# Patient Record
Sex: Female | Born: 1960
Health system: Southern US, Community
[De-identification: ages and names within clinical notes are randomized; demographics above are authoritative.]

## PROBLEM LIST (undated history)

## (undated) DIAGNOSIS — L853 Xerosis cutis: Secondary | ICD-10-CM

## (undated) DIAGNOSIS — Z9889 Other specified postprocedural states: Secondary | ICD-10-CM

## (undated) DIAGNOSIS — M1990 Unspecified osteoarthritis, unspecified site: Secondary | ICD-10-CM

## (undated) DIAGNOSIS — F32A Depression, unspecified: Secondary | ICD-10-CM

## (undated) DIAGNOSIS — K219 Gastro-esophageal reflux disease without esophagitis: Secondary | ICD-10-CM

## (undated) DIAGNOSIS — D649 Anemia, unspecified: Secondary | ICD-10-CM

## (undated) DIAGNOSIS — Z872 Personal history of diseases of the skin and subcutaneous tissue: Secondary | ICD-10-CM

## (undated) DIAGNOSIS — L519 Erythema multiforme, unspecified: Secondary | ICD-10-CM

## (undated) DIAGNOSIS — M1610 Unilateral primary osteoarthritis, unspecified hip: Secondary | ICD-10-CM

## (undated) DIAGNOSIS — K5730 Diverticulosis of large intestine without perforation or abscess without bleeding: Secondary | ICD-10-CM

## (undated) DIAGNOSIS — M722 Plantar fascial fibromatosis: Secondary | ICD-10-CM

## (undated) DIAGNOSIS — N200 Calculus of kidney: Secondary | ICD-10-CM

## (undated) DIAGNOSIS — L409 Psoriasis, unspecified: Secondary | ICD-10-CM

## (undated) DIAGNOSIS — F329 Major depressive disorder, single episode, unspecified: Secondary | ICD-10-CM

## (undated) DIAGNOSIS — M2550 Pain in unspecified joint: Secondary | ICD-10-CM

## (undated) DIAGNOSIS — Z8719 Personal history of other diseases of the digestive system: Secondary | ICD-10-CM

## (undated) DIAGNOSIS — E785 Hyperlipidemia, unspecified: Secondary | ICD-10-CM

## (undated) DIAGNOSIS — R5383 Other fatigue: Secondary | ICD-10-CM

## (undated) DIAGNOSIS — Z86010 Personal history of colonic polyps: Secondary | ICD-10-CM

## (undated) DIAGNOSIS — Z780 Asymptomatic menopausal state: Secondary | ICD-10-CM

## (undated) DIAGNOSIS — R112 Nausea with vomiting, unspecified: Secondary | ICD-10-CM

## (undated) DIAGNOSIS — Z87442 Personal history of urinary calculi: Secondary | ICD-10-CM

## (undated) DIAGNOSIS — L509 Urticaria, unspecified: Secondary | ICD-10-CM

## (undated) DIAGNOSIS — E669 Obesity, unspecified: Secondary | ICD-10-CM

## (undated) DIAGNOSIS — E559 Vitamin D deficiency, unspecified: Secondary | ICD-10-CM

## (undated) HISTORY — PX: WISDOM TOOTH EXTRACTION: SHX21

## (undated) HISTORY — DX: Anemia, unspecified: D64.9

## (undated) HISTORY — DX: Personal history of urinary calculi: Z87.442

## (undated) HISTORY — DX: Pain in unspecified joint: M25.50

## (undated) HISTORY — PX: STRABISMUS SURGERY: SHX218

## (undated) HISTORY — DX: Xerosis cutis: L85.3

## (undated) HISTORY — DX: Vitamin D deficiency, unspecified: E55.9

## (undated) HISTORY — DX: Psoriasis, unspecified: L40.9

## (undated) HISTORY — DX: Personal history of diseases of the skin and subcutaneous tissue: Z87.2

## (undated) HISTORY — DX: Unilateral primary osteoarthritis, unspecified hip: M16.10

## (undated) HISTORY — DX: Gastro-esophageal reflux disease without esophagitis: K21.9

## (undated) HISTORY — PX: CLOSED MANIPULATION SHOULDER: SUR205

## (undated) HISTORY — DX: Erythema multiforme, unspecified: L51.9

## (undated) HISTORY — DX: Hyperlipidemia, unspecified: E78.5

## (undated) HISTORY — DX: Asymptomatic menopausal state: Z78.0

## (undated) HISTORY — DX: Other fatigue: R53.83

## (undated) HISTORY — DX: Urticaria, unspecified: L50.9

## (undated) HISTORY — DX: Calculus of kidney: N20.0

## (undated) HISTORY — PX: RHINOPLASTY: SUR1284

## (undated) HISTORY — DX: Obesity, unspecified: E66.9

## (undated) HISTORY — DX: Personal history of colonic polyps: Z86.010

---

## 1991-11-16 HISTORY — PX: HAND TENDON SURGERY: SHX663

## 1999-01-09 ENCOUNTER — Encounter: Admission: RE | Admit: 1999-01-09 | Discharge: 1999-04-09 | Payer: Self-pay | Admitting: Family Medicine

## 1999-05-27 ENCOUNTER — Ambulatory Visit (HOSPITAL_COMMUNITY): Admission: RE | Admit: 1999-05-27 | Discharge: 1999-05-27 | Payer: Self-pay | Admitting: Internal Medicine

## 1999-05-27 ENCOUNTER — Encounter: Payer: Self-pay | Admitting: Internal Medicine

## 1999-10-27 ENCOUNTER — Encounter: Admission: RE | Admit: 1999-10-27 | Discharge: 2000-01-25 | Payer: Self-pay | Admitting: Family Medicine

## 1999-12-01 ENCOUNTER — Other Ambulatory Visit: Admission: RE | Admit: 1999-12-01 | Discharge: 1999-12-01 | Payer: Self-pay | Admitting: Family Medicine

## 2001-05-10 ENCOUNTER — Other Ambulatory Visit: Admission: RE | Admit: 2001-05-10 | Discharge: 2001-05-10 | Payer: Self-pay | Admitting: Obstetrics and Gynecology

## 2002-01-31 ENCOUNTER — Encounter: Payer: Self-pay | Admitting: Surgery

## 2002-01-31 ENCOUNTER — Observation Stay (HOSPITAL_COMMUNITY): Admission: RE | Admit: 2002-01-31 | Discharge: 2002-02-01 | Payer: Self-pay | Admitting: Surgery

## 2002-01-31 ENCOUNTER — Encounter (INDEPENDENT_AMBULATORY_CARE_PROVIDER_SITE_OTHER): Payer: Self-pay | Admitting: Specialist

## 2002-01-31 HISTORY — PX: LAPAROSCOPIC CHOLECYSTECTOMY: SUR755

## 2002-06-05 ENCOUNTER — Other Ambulatory Visit: Admission: RE | Admit: 2002-06-05 | Discharge: 2002-06-05 | Payer: Self-pay | Admitting: Obstetrics and Gynecology

## 2003-05-23 ENCOUNTER — Encounter: Payer: Self-pay | Admitting: Rheumatology

## 2003-05-23 ENCOUNTER — Ambulatory Visit (HOSPITAL_COMMUNITY): Admission: RE | Admit: 2003-05-23 | Discharge: 2003-05-23 | Payer: Self-pay | Admitting: Rheumatology

## 2003-07-30 ENCOUNTER — Other Ambulatory Visit: Admission: RE | Admit: 2003-07-30 | Discharge: 2003-07-30 | Payer: Self-pay | Admitting: Obstetrics and Gynecology

## 2004-07-31 ENCOUNTER — Other Ambulatory Visit: Admission: RE | Admit: 2004-07-31 | Discharge: 2004-07-31 | Payer: Self-pay | Admitting: Obstetrics and Gynecology

## 2004-11-20 ENCOUNTER — Encounter: Admission: RE | Admit: 2004-11-20 | Discharge: 2004-11-20 | Payer: Self-pay | Admitting: Obstetrics and Gynecology

## 2005-10-06 ENCOUNTER — Ambulatory Visit (HOSPITAL_COMMUNITY): Admission: RE | Admit: 2005-10-06 | Discharge: 2005-10-06 | Payer: Self-pay | Admitting: Urology

## 2005-10-06 ENCOUNTER — Ambulatory Visit (HOSPITAL_BASED_OUTPATIENT_CLINIC_OR_DEPARTMENT_OTHER): Admission: RE | Admit: 2005-10-06 | Discharge: 2005-10-06 | Payer: Self-pay | Admitting: Urology

## 2005-10-06 HISTORY — PX: OTHER SURGICAL HISTORY: SHX169

## 2005-11-03 ENCOUNTER — Other Ambulatory Visit: Admission: RE | Admit: 2005-11-03 | Discharge: 2005-11-03 | Payer: Self-pay | Admitting: Obstetrics and Gynecology

## 2005-11-15 HISTORY — PX: RHINOPLASTY: SUR1284

## 2006-11-17 ENCOUNTER — Ambulatory Visit (HOSPITAL_COMMUNITY): Admission: RE | Admit: 2006-11-17 | Discharge: 2006-11-17 | Payer: Self-pay | Admitting: Obstetrics and Gynecology

## 2006-11-25 ENCOUNTER — Encounter: Admission: RE | Admit: 2006-11-25 | Discharge: 2006-11-25 | Payer: Self-pay | Admitting: Obstetrics and Gynecology

## 2008-05-21 ENCOUNTER — Ambulatory Visit (HOSPITAL_COMMUNITY): Admission: RE | Admit: 2008-05-21 | Discharge: 2008-05-21 | Payer: Self-pay | Admitting: Urology

## 2008-05-21 ENCOUNTER — Ambulatory Visit (HOSPITAL_COMMUNITY): Admission: RE | Admit: 2008-05-21 | Discharge: 2008-05-21 | Payer: Self-pay | Admitting: Obstetrics and Gynecology

## 2008-05-24 ENCOUNTER — Ambulatory Visit (HOSPITAL_BASED_OUTPATIENT_CLINIC_OR_DEPARTMENT_OTHER): Admission: RE | Admit: 2008-05-24 | Discharge: 2008-05-24 | Payer: Self-pay | Admitting: Urology

## 2008-05-24 HISTORY — PX: OTHER SURGICAL HISTORY: SHX169

## 2009-04-11 ENCOUNTER — Other Ambulatory Visit: Admission: RE | Admit: 2009-04-11 | Discharge: 2009-04-11 | Payer: Self-pay | Admitting: Family Medicine

## 2010-01-26 ENCOUNTER — Ambulatory Visit (HOSPITAL_COMMUNITY): Admission: RE | Admit: 2010-01-26 | Discharge: 2010-01-26 | Payer: Self-pay | Admitting: Obstetrics and Gynecology

## 2010-02-11 ENCOUNTER — Ambulatory Visit (HOSPITAL_COMMUNITY): Admission: RE | Admit: 2010-02-11 | Discharge: 2010-02-11 | Payer: Self-pay | Admitting: Internal Medicine

## 2010-02-13 ENCOUNTER — Ambulatory Visit (HOSPITAL_COMMUNITY): Admission: RE | Admit: 2010-02-13 | Discharge: 2010-02-13 | Payer: Self-pay | Admitting: Urology

## 2010-02-13 HISTORY — PX: OTHER SURGICAL HISTORY: SHX169

## 2011-02-03 LAB — COMPREHENSIVE METABOLIC PANEL
ALT: 16 U/L (ref 0–35)
AST: 16 U/L (ref 0–37)
Albumin: 3.2 g/dL — ABNORMAL LOW (ref 3.5–5.2)
Alkaline Phosphatase: 123 U/L — ABNORMAL HIGH (ref 39–117)
BUN: 8 mg/dL (ref 6–23)
CO2: 28 mEq/L (ref 19–32)
Calcium: 8.7 mg/dL (ref 8.4–10.5)
Chloride: 109 mEq/L (ref 96–112)
Creatinine, Ser: 0.77 mg/dL (ref 0.4–1.2)
GFR calc Af Amer: >60 mL/min (ref >60)
GFR calc non Af Amer: >60 mL/min (ref >60)
Glucose, Bld: 91 mg/dL (ref 70–99)
Potassium: 3.8 mEq/L (ref 3.5–5.1)
Sodium: 141 mEq/L (ref 135–145)
Total Bilirubin: 0.6 mg/dL (ref 0.3–1.2)
Total Protein: 6.5 g/dL (ref 6.0–8.3)

## 2011-02-03 LAB — URINALYSIS, ROUTINE W REFLEX MICROSCOPIC
Bilirubin Urine: NEGATIVE
Glucose, UA: NEGATIVE mg/dL
Ketones, ur: NEGATIVE mg/dL
Nitrite: NEGATIVE
Protein, ur: NEGATIVE mg/dL
Specific Gravity, Urine: 1.012 (ref 1.005–1.030)
Urobilinogen, UA: 0.2 mg/dL (ref 0.0–1.0)
pH: 5.5 (ref 5.0–8.0)

## 2011-02-03 LAB — APTT: aPTT: 27 seconds (ref 24–37)

## 2011-02-03 LAB — PROTIME-INR
INR: 0.95 (ref 0.00–1.49)
Prothrombin Time: 12.6 seconds (ref 11.6–15.2)

## 2011-02-03 LAB — URINE MICROSCOPIC-ADD ON

## 2011-02-03 LAB — PREGNANCY, URINE: Preg Test, Ur: NEGATIVE

## 2011-02-08 ENCOUNTER — Other Ambulatory Visit (HOSPITAL_COMMUNITY): Payer: Self-pay | Admitting: Obstetrics and Gynecology

## 2011-02-08 DIAGNOSIS — Z1231 Encounter for screening mammogram for malignant neoplasm of breast: Secondary | ICD-10-CM

## 2011-02-16 ENCOUNTER — Ambulatory Visit (HOSPITAL_COMMUNITY): Admission: RE | Admit: 2011-02-16 | Payer: Commercial Managed Care - PPO | Source: Ambulatory Visit

## 2011-03-12 ENCOUNTER — Ambulatory Visit (HOSPITAL_COMMUNITY)
Admission: RE | Admit: 2011-03-12 | Discharge: 2011-03-12 | Disposition: A | Discharge status: Home / Self Care | Payer: Commercial Managed Care - PPO | Source: Ambulatory Visit | Attending: Obstetrics and Gynecology | Admitting: Obstetrics and Gynecology

## 2011-03-12 DIAGNOSIS — Z1231 Encounter for screening mammogram for malignant neoplasm of breast: Secondary | ICD-10-CM | POA: Insufficient documentation

## 2011-03-30 NOTE — Op Note (Signed)
[name], SCHEMM NO.:  [id number]   MEDICAL RECORD NO.:  [id number]          PATIENT TYPE:  OUT   LOCATION:  XRAY                          FACILITY:  [facility]   PHYSICIAN:  Ronald L. Earlene Plater, M.D.  DATE OF BIRTH:  [birth year]   DATE OF PROCEDURE:  05/24/2008  DATE OF DISCHARGE:  05/21/2008                               OPERATIVE REPORT   DIAGNOSIS:  Type 3 stress urinary incontinence.   OPERATIVE PROCEDURE:  Cystourethroscopy and placement of Lynx suprapubic  sling.   SURGEON:  Gaynelle Arabian, M.D.   ANESTHESIA:  General mask.   ESTIMATED BLOOD LOSS:  25 mL.   TUBES:  None.   COMPLICATIONS:  None.   INDICATIONS FOR PROCEDURE:  Dana Williams is a lovely 50 year old white female  who presented with persistent stress urinary incontinence.  She  underwent urodynamics which revealed a stable bladder.  However, she was  found have a leak point pressure 97 cm of water at 100 mL volume.  At  higher  volumes it leaked more, so we felt that it was clinically type 3  stress urinary incontinence, although it was a borderline leak point  pressure.  After understanding risks, benefits and alternatives, she  elected to proceed with cystourethroscopy and placement of Lynx  suprapubic sling.   PROCEDURE IN DETAIL:  The patient was placed in supine position and  after proper general mask anesthesia, was placed in the dorsal lithotomy  position and prepped and draped with Betadine in sterile fashion.  A 16-  French Foley catheter was inserted.  The bladder was drained.  An  approximately 1 cm incision was made at the mid urethral area over  vertically through the mucosa after approximately 4 mL of 1% Xylocaine  with epinephrine had been injected submucosally.  Flaps were created  right and left side and the endopelvic fascia could be palpated under  the pubis.  Punch holes were then performed in the suprapubic region one  fingerbreadth superior and two fingerbreadths lateral to the  midline  pubis and utilizing the needles, each was inserted in the proper manner  and delivered through both the right and left sides through the  endopelvic fascia into the vaginal wound.  A cystourethroscopy was then  performed.  The bladder was distended and with the 12 and 70 degrees  lenses there was no perforation noted.  Efflux of clear urine was noted  from normally placed ureteral orifices bilaterally.  The bladder was  smooth-walled.  The bladder was then drained and the [place] sling was  placed and felt to be in good position.  No undue tension was noted and  it completely deployed.  A scissors could be placed behind it easily so  it did not distort the urethra.  Reinspection of the bladder again with  12 and 70 degree lenses fully distended revealed there were no  perforations.  The bladder was again drained.  The mucosa was closed  with a running 3-0 Vicryl suture and  the tapes were cut at the skin level and the skin punch holes were  closed  with Dermabond.  It was felt that no packing was necessary and  the patient tolerated procedure well with no complications and she was  taken to the recovery room stable.      Ronald L. Earlene Plater, M.D.  Electronically Signed     RLD/MEDQ  D:  05/24/2008  T:  05/24/2008  Job:  [id number]

## 2011-04-02 NOTE — Op Note (Signed)
NAMECODIE, HAINER                ACCOUNT NO.:  [id number]   MEDICAL RECORD NO.:  [id number]          PATIENT TYPE:  AMB   LOCATION:  [facility]                         FACILITY:  [facility]   PHYSICIAN:  Ronald L. Earlene Plater, M.D.  DATE OF BIRTH:  [birth year]   DATE OF PROCEDURE:  10/06/2005  DATE OF DISCHARGE:                                 OPERATIVE REPORT   PREOPERATIVE DIAGNOSES:  Left ureterolithiasis with impending sepsis.   POSTOPERATIVE DIAGNOSES:  Left ureterolithiasis with impending sepsis.   PROCEDURE:  Cystourethroscopy, left retrograde ureteropyelogram, left  ureteroscopy with holmium laser lithotripsy and stone basketing, fragment  extraction and placement of left double-J stent.   SURGEON:  Lucrezia Starch. Earlene Plater, M.D.   ANESTHESIA:  LMA.   ESTIMATED BLOOD LOSS:  Negligible.   TUBES:  26 cm 6 [place] Cook double pigtail stent.   COMPLICATIONS:  None.   INDICATIONS FOR PROCEDURE:  Ms. Herbison is a lovely 50 year old white female  who presented with a several day history of left flank pain, nausea and  vomiting. She had had fever up to 101 and had begun Cipro over the weekend.  She was seen yesterday and did not appear to be septic; however, she had not  passed a stone fragment and although the pain was much less it was still  present and it was felt that intervention was indicated. A CT scan revealed  an approximately 3 mm left lower ureteral calculus with some  hydroureteronephrosis. After understanding the risks, benefits, and  alternatives, she has elected to proceed. Of note, she also had some stones  in the right kidney and a 4 mm lower pole stone in the left kidney.   DESCRIPTION OF PROCEDURE:  The patient was placed in the supine position,  proper LMA anesthesia, was placed in the dorsal lithotomy position then  prepped and draped with Betadine in a sterile fashion. Cystourethroscopy was  performed with a 22.5 French Olympus panendoscope utilizing the 12 and 70  degree  lenses. The bladder was carefully inspected and noted to be without  lesions. Both ureteral orifices were in the normal location. Under  fluoroscopic guidance, a slippery tipped guidewire was placed, 0.38 [place],  was placed into the left renal pelvis. After a retrograde ureteropyelogram  was performed with a 6 [place] open ended catheter, the filling defect was  noted just below the vessels on the left side in the junction between the  mid third and lower third ureter and with some hydronephrosis above it. The  wire was passed past it and utilizing the short thin semiflexible  ureteroscope, the area was visualized and there was noted to be a highly  impacted yellowish stone present which was approximately 3 mm in diameter.  Utilizing the 365 holmium laser fiber on a setting of 0.5 and a repetition  rate of 6, the stone was serially fragmented and larger fragments were  removed with a nitinol basket and will be submitted for stone analysis.  Reinspection of the lower two-thirds ureter revealed no significant  fragments, no perforations were present and no other lesions were noted to  be present. The ureteroscope was visually removed  under fluoroscopic guidance. A 26 cm 6 [place] Cook double pigtail stent with  a pullout string was placed and noted to be in good position within the left  renal pelvis within the bladder. The bladder was drained, panendoscope was  removed. The string was taped to the perineum and the patient was taken to  the recovery room stable.      Ronald L. Earlene Plater, M.D.  Electronically Signed     RLD/MEDQ  D:  10/06/2005  T:  10/06/2005  Job:  [id number]

## 2011-04-02 NOTE — Op Note (Signed)
[facility]  Patient:    Dana Williams, Dana Williams Visit Number: [id number] MRN: [id number]          Service Type: SUR Location: 3W 0351 01 Attending Physician:  Dana Williams Dictated by:   Dana Williams, M.D. Proc. Date: 01/31/02 Admit Date:  01/31/2002   CC:         Dana Williams, M.D.  Dana Williams, M.D.  Dana Williams, M.D.   Operative Report  DATE OF BIRTH:  [birth year]  PREOPERATIVE DIAGNOSIS:  Chronic cholecystitis and cholelithiasis.  POSTOPERATIVE DIAGNOSIS:  Chronic cholecystitis and cholelithiasis.  PROCEDURE:  Laparoscopic cholecystectomy with intraoperative cholangiogram.  SURGEON:  Dana Williams, M.D.  FIRST ASSISTANT:  Dana Williams, M.D.  ANESTHESIA:  General endotracheal.  ESTIMATED BLOOD LOSS:  Minimal.  INDICATION FOR PROCEDURE:  Dana Williams is a 50 year old white female, who has had gallstones with a couple of attacks.  By ultrasound, her gallstone measures approximately 1.7 cm.  She now comes for attempted laparoscopic cholecystectomy.  I have discussed with her both the indications and potential complications of the procedure.  OPERATIVE NOTE:  The patient was placed in a supine position, given a general endotracheal anesthetic.  She was given 1 g of Ancef at the initiation of the procedure.  She had PAS stockings on her legs.  Her abdomen was prepped with Betadine solution and sterilely draped.  An infraumbilical incision was made with sharp dissection carried down to the abdominal cavity.  A 10 mm laparoscope was inserted through a 12 mm Hasson trocar.  The Hasson trocar was secured with a 0 Vicryl suture.  A laparoscopic exploration revealed the right and left lobes of the liver unremarkable.  Anterior wall of the stomach was unremarkable.  I could see the right colon and pelvis, saw no mass, nodule, or other suspicious abnormality.  I then place three trocars, a 10 mm Ethicon trocar in  the subxiphoid location, a 5 mm Ethicon trocar in the right mid subcostal location, and a 5 mm Ethicon trocar in the right lateral subcostal location.  The gallbladder was thickened, consistent with a probable chronic cholecystitis.  The gallbladder was grasped, rotated cephalad.  Dissection was then carried out at the gallbladder cystic duct junction.  I identified the triangle of Calot, isolated the cystic duct, and shot intraoperative cholangiogram.  Intraoperative cholangiogram was shot using a cut off taut catheter, inserted through a 14 gauge Jelco in the right subcostal location.  This taut catheter was then placed in the side of a cut cystic duct and secured with an Endo Clip.  Using fluoroscopy, I injected between 6-8 cc of half-strength Hypaque solution and showed free flow of contrast down the cystic duct into the common bile duct into the duodenum.  The contrast refluxed up into the hepatic radicles. There was no filling defect, no mass, no obstruction.  This was felt to be a normal intraoperative cholangiogram.  The taut catheter was then removed from the cystic duct.  The cystic duct was triply Endo Clipped and then divided.  The cystic artery had an anterior and posterior branch.  Both branches were identified separately, doubly Endo Clipped, and divided.  The gallbladder was then sharply and bluntly dissected from the gallbladder bed using primarily hook Bovie coagulation.  Prior to division of the gallbladder from the gallbladder bed, I revisualized the triangle of Calot and the gallbladder bed, saw no bleeding or bile leak.  The gallbladder was then divided  and delivered through the umbilicus intact and sent to pathology.  I irrigated out the abdomen with approximately 500 cc of saline.  I then removed the trocar under direct visualization.  There was no bleeding in the trocar site.  The umbilical trocar was closed with the pursestring 0 Vicryl which was already  there.  And, again, each trocar site had been infiltrated with 0.25% Marcaine, approximately 15 cc used for all four trocar sites.  The skin incision was then closed with a 5-0 Vicryl, was painted with tincture of Benzoin, Steri-Strips, and sterilely dressed.  The patient tolerated the procedure well, was transferred to recovery room in good condition.  Sponge and needle counts were correct. Dictated by:   Dana Williams, M.D. Attending Physician:  Dana Williams DD:  01/31/02 TD:  01/31/02 Job: [id number]

## 2011-08-12 LAB — POCT HEMOGLOBIN-HEMACUE
Hemoglobin: 12.8
Operator id: 133231

## 2012-01-14 ENCOUNTER — Other Ambulatory Visit: Payer: Self-pay

## 2012-04-04 ENCOUNTER — Other Ambulatory Visit (HOSPITAL_COMMUNITY): Payer: Self-pay | Admitting: Obstetrics and Gynecology

## 2012-04-04 DIAGNOSIS — Z0000 Encounter for general adult medical examination without abnormal findings: Secondary | ICD-10-CM

## 2012-04-04 DIAGNOSIS — Z1231 Encounter for screening mammogram for malignant neoplasm of breast: Secondary | ICD-10-CM

## 2012-04-18 ENCOUNTER — Encounter: Payer: Self-pay | Admitting: Family Medicine

## 2012-04-18 ENCOUNTER — Ambulatory Visit (INDEPENDENT_AMBULATORY_CARE_PROVIDER_SITE_OTHER): Payer: 59 | Admitting: Family Medicine

## 2012-04-18 VITALS — Ht 66.0 in | Wt 211.1 lb

## 2012-04-18 DIAGNOSIS — E785 Hyperlipidemia, unspecified: Secondary | ICD-10-CM

## 2012-04-18 NOTE — Patient Instructions (Addendum)
-   Google ROBERT LUSTIG and SUGAR.  - Write a summary of what you learn from this.   - Remember:  We make time for what we value.    - More home food prep requires more organization than time.   - Goals:  1. Walk outside at least 20 min 4 X wk.     - Go after work, before you go home.    2. Include non-starchy vegetables at least 2 X day.    3. Try one veg per week that is not squash, green beans, or zucchini.     - Salads, tomatoes, cucumber, carrots, mixed veg's, snow peas, broccoli, cauliflower, sauteed leafy greens.    - Try roasting any veg's; check out veg recipes online.   - AT FOLLOW-UP, WE'LL TALK ABOUT EMOTIONAL EATING AND APPETITE CONTROL.

## 2012-04-18 NOTE — Progress Notes (Signed)
Medical Nutrition Therapy:  Appt start time: 1600 end time:  1700.  Assessment:  Primary concerns today: Weight management and hyperlipidemia.  PCP is Sigmund Hazel, MD w/ [facility].  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  Starting in Aug, the work schedule will change week-to-week in terms of which days she works.   Usual eating pattern includes 3 meals and 1-2 snacks per day.  Everyday foods include whole-wheat waffles w/ fake butter & peanut butter, 96 oz Crystal Light, diet Mtn Dew.  Avoided foods include leafy greens, Brussels sprouts.   Usual physical activity includes none.    Annaleia is a single parent of a 17-YO X 15 yrs.  She now realizes her world has revolved about around her daughter, so she now often feels isolated.  She attributes much of her weight gain to her becoming more isolated.  She is alone a lot, and often eats foods that are more like snacks in place of meals rather than cook for herself.  She and her daughter have both just started counseling.  She hopes to start feeling motivated to exercise once her anti-depressant kicks in, but finances are tight, so she doesn't foresee joining a gym any time soon.    24-hr recall: B (6 AM)-   1 c coffee w/ 2 tbsp sugar-free [place] Vanilla Creamer, 2 Equal pkts, 2 whole-wheat waffles w/ fake butter & peanut butter L(11 PM)-   3 oz grilled chx, 1/2 corn, 1/2 c apple sauce, Crystal Light Snk-   none D ( PM)-  4 oz grilled chx breast w/ BBQ sauce, 1/2 grilled squash, diet Mtn Dew Snk ( PM)-  2 fun-size [place] Bars  Progress Towards Goal(s):  In progress.   Nutritional Diagnosis:  NB-2.1 Physical inactivity As related to poor motivation.  As evidenced by no regular activity. NI-1.5 Excessive energy intake As related to expenditure.  As evidenced by BMI of 34.1.    Intervention:  Nutrition education.  Monitoring/Evaluation:  Dietary intake, exercise, and body weight in 2 weeks.

## 2012-05-01 ENCOUNTER — Ambulatory Visit: Payer: 59 | Admitting: Family Medicine

## 2012-05-02 ENCOUNTER — Inpatient Hospital Stay (HOSPITAL_COMMUNITY): Admission: RE | Admit: 2012-05-02 | Payer: Commercial Managed Care - PPO | Source: Ambulatory Visit

## 2012-05-08 ENCOUNTER — Ambulatory Visit: Payer: 59 | Admitting: Family Medicine

## 2012-06-07 ENCOUNTER — Other Ambulatory Visit: Payer: Self-pay

## 2012-07-20 ENCOUNTER — Emergency Department (HOSPITAL_COMMUNITY): Admission: EM | Admit: 2012-07-20 | Discharge: 2012-07-20 | Payer: Self-pay

## 2013-07-02 ENCOUNTER — Other Ambulatory Visit (HOSPITAL_COMMUNITY): Payer: Self-pay | Admitting: *Deleted

## 2013-07-02 DIAGNOSIS — N200 Calculus of kidney: Secondary | ICD-10-CM

## 2013-07-03 ENCOUNTER — Ambulatory Visit (HOSPITAL_COMMUNITY)
Admission: RE | Admit: 2013-07-03 | Discharge: 2013-07-03 | Disposition: A | Discharge status: Home / Self Care | Payer: 59 | Source: Ambulatory Visit | Attending: Urology | Admitting: Urology

## 2013-07-03 DIAGNOSIS — N200 Calculus of kidney: Secondary | ICD-10-CM | POA: Insufficient documentation

## 2013-07-03 DIAGNOSIS — Z87442 Personal history of urinary calculi: Secondary | ICD-10-CM | POA: Insufficient documentation

## 2013-09-27 ENCOUNTER — Other Ambulatory Visit: Payer: Self-pay | Admitting: Dermatology

## 2013-11-20 ENCOUNTER — Other Ambulatory Visit (HOSPITAL_COMMUNITY): Payer: Self-pay | Admitting: Physician Assistant

## 2013-11-20 DIAGNOSIS — Z1231 Encounter for screening mammogram for malignant neoplasm of breast: Secondary | ICD-10-CM

## 2013-12-28 ENCOUNTER — Ambulatory Visit (AMBULATORY_SURGERY_CENTER): Payer: Self-pay | Admitting: *Deleted

## 2013-12-28 VITALS — Ht 66.0 in | Wt 215.0 lb

## 2013-12-28 DIAGNOSIS — Z1211 Encounter for screening for malignant neoplasm of colon: Secondary | ICD-10-CM

## 2013-12-28 MED ORDER — NA SULFATE-K SULFATE-MG SULF 17.5-3.13-1.6 GM/177ML PO SOLN: 1.0000 | Freq: Once | ORAL | Status: DC

## 2013-12-28 NOTE — Progress Notes (Signed)
Denies allergies to eggs or soy products. Denies complications with sedation or anesthesia. 

## 2014-01-01 ENCOUNTER — Other Ambulatory Visit (HOSPITAL_COMMUNITY): Payer: Self-pay | Admitting: Physician Assistant

## 2014-01-01 ENCOUNTER — Ambulatory Visit (HOSPITAL_COMMUNITY)
Admission: RE | Admit: 2014-01-01 | Discharge: 2014-01-01 | Disposition: A | Discharge status: Home / Self Care | Payer: 59 | Source: Ambulatory Visit | Attending: Physician Assistant | Admitting: Physician Assistant

## 2014-01-01 DIAGNOSIS — Z1231 Encounter for screening mammogram for malignant neoplasm of breast: Secondary | ICD-10-CM

## 2014-01-02 ENCOUNTER — Ambulatory Visit (HOSPITAL_COMMUNITY): Payer: 59

## 2014-01-03 ENCOUNTER — Other Ambulatory Visit: Payer: Self-pay | Admitting: Obstetrics and Gynecology

## 2014-01-03 DIAGNOSIS — R928 Other abnormal and inconclusive findings on diagnostic imaging of breast: Secondary | ICD-10-CM

## 2014-01-07 ENCOUNTER — Other Ambulatory Visit (HOSPITAL_COMMUNITY): Payer: Self-pay | Admitting: Obstetrics and Gynecology

## 2014-01-07 DIAGNOSIS — Z1231 Encounter for screening mammogram for malignant neoplasm of breast: Secondary | ICD-10-CM

## 2014-01-16 ENCOUNTER — Ambulatory Visit
Admission: RE | Admit: 2014-01-16 | Discharge: 2014-01-16 | Disposition: A | Discharge status: Home / Self Care | Payer: 59 | Source: Ambulatory Visit | Attending: Obstetrics and Gynecology | Admitting: Obstetrics and Gynecology

## 2014-01-16 DIAGNOSIS — R928 Other abnormal and inconclusive findings on diagnostic imaging of breast: Secondary | ICD-10-CM

## 2014-01-25 ENCOUNTER — Ambulatory Visit (AMBULATORY_SURGERY_CENTER): Payer: 59 | Admitting: Internal Medicine

## 2014-01-25 ENCOUNTER — Encounter: Payer: Self-pay | Admitting: Internal Medicine

## 2014-01-25 VITALS — BP 135/75 | HR 83 | Temp 98.2°F | Resp 15 | Ht 66.0 in | Wt 215.0 lb

## 2014-01-25 DIAGNOSIS — K5730 Diverticulosis of large intestine without perforation or abscess without bleeding: Secondary | ICD-10-CM

## 2014-01-25 DIAGNOSIS — D126 Benign neoplasm of colon, unspecified: Secondary | ICD-10-CM

## 2014-01-25 DIAGNOSIS — Z1211 Encounter for screening for malignant neoplasm of colon: Secondary | ICD-10-CM

## 2014-01-25 HISTORY — PX: COLONOSCOPY WITH PROPOFOL: SHX5780

## 2014-01-25 MED ORDER — SODIUM CHLORIDE 0.9 % IV SOLN: 500.0000 mL | INTRAVENOUS | Status: DC

## 2014-01-25 NOTE — Patient Instructions (Addendum)
I found and removed one tint (85mm) polyp. You have mild-moderate diverticulosis. Everything else normal and a good prep.  I will let you know pathology results and when to have another routine colonoscopy by mail.  I appreciate the opportunity to care for you. Gatha Mayer, MD, FACG  YOU HAD AN ENDOSCOPIC PROCEDURE TODAY AT [facility]: Refer to the procedure report that was given to you for any specific questions about what was found during the examination.  If the procedure report does not answer your questions, please call your gastroenterologist to clarify.  If you requested that your care partner not be given the details of your procedure findings, then the procedure report has been included in a sealed envelope for you to review at your convenience later.  YOU SHOULD EXPECT: Some feelings of bloating in the abdomen. Passage of more gas than usual.  Walking can help get rid of the air that was put into your GI tract during the procedure and reduce the bloating. If you had a lower endoscopy (such as a colonoscopy or flexible sigmoidoscopy) you may notice spotting of blood in your stool or on the toilet paper. If you underwent a bowel prep for your procedure, then you may not have a normal bowel movement for a few days.  DIET: Your first meal following the procedure should be a light meal and then it is ok to progress to your normal diet.  A half-sandwich or bowl of soup is an example of a good first meal.  Heavy or fried foods are harder to digest and may make you feel nauseous or bloated.  Likewise meals heavy in dairy and vegetables can cause extra gas to form and this can also increase the bloating.  Drink plenty of fluids but you should avoid alcoholic beverages for 24 hours.  ACTIVITY: Your care partner should take you home directly after the procedure.  You should plan to take it easy, moving slowly for the rest of the day.  You can resume normal activity the day after  the procedure however you should NOT DRIVE or use heavy machinery for 24 hours (because of the sedation medicines used during the test).    SYMPTOMS TO REPORT IMMEDIATELY: A gastroenterologist can be reached at any hour.  During normal business hours, 8:30 AM to 5:00 PM Monday through Friday, call [phone].  After hours and on weekends, please call the GI answering service at [phone] who will take a message and have the physician on call contact you.   Following lower endoscopy (colonoscopy or flexible sigmoidoscopy):  Excessive amounts of blood in the stool  Significant tenderness or worsening of abdominal pains  Swelling of the abdomen that is new, acute  Fever of 100F or higher  FOLLOW UP: If any biopsies were taken you will be contacted by phone or by letter within the next 1-3 weeks.  Call your gastroenterologist if you have not heard about the biopsies in 3 weeks.  Our staff will call the home number listed on your records the next business day following your procedure to check on you and address any questions or concerns that you may have at that time regarding the information given to you following your procedure. This is a courtesy call and so if there is no answer at the home number and we have not heard from you through the emergency physician on call, we will assume that you have returned to your regular daily activities without  incident.  SIGNATURES/CONFIDENTIALITY: You and/or your care partner have signed paperwork which will be entered into your electronic medical record.  These signatures attest to the fact that that the information above on your After Visit Summary has been reviewed and is understood.  Full responsibility of the confidentiality of this discharge information lies with you and/or your care-partner.

## 2014-01-25 NOTE — Op Note (Signed)
[place]  [address] [place], [phone]   COLONOSCOPY PROCEDURE REPORT  PATIENT: Atiya, Dana Williams  MR#: [id number] BIRTHDATE: October 24, 1961 , 74  yrs. old GENDER: Female ENDOSCOPIST: Gatha Mayer, MD, [facility] REFERRED YW:VPXT Sabra Heck, M.D. PROCEDURE DATE:  01/25/2014 PROCEDURE:   Colonoscopy with biopsy First Screening Colonoscopy - Avg.  risk and is 50 yrs.  old or older Yes.  Prior Negative Screening - Now for repeat screening. N/A  History of Adenoma - Now for follow-up colonoscopy & has been > or = to 3 yrs.  N/A  Polyps Removed Today? Yes. ASA CLASS:   Class II INDICATIONS:average risk screening and first colonoscopy. MEDICATIONS: propofol (Diprivan) 400mg  IV, MAC sedation, administered by CRNA, and These medications were titrated to patient response per physician's verbal order  DESCRIPTION OF PROCEDURE:   After the risks benefits and alternatives of the procedure were thoroughly explained, informed consent was obtained.  A digital rectal exam revealed no abnormalities of the rectum.   The LB [id number]  endoscope was introduced through the anus and advanced to the cecum, which was identified by both the appendix and ileocecal valve. No adverse events experienced.   The quality of the prep was Suprep good  The instrument was then slowly withdrawn as the colon was fully examined.   COLON FINDINGS: A sessile polyp measuring 2 mm in size was found in the ascending colon.  A polypectomy was performed with cold forceps.  The resection was complete and the polyp tissue was completely retrieved.   Moderate diverticulosis was noted in the sigmoid colon.   The colon mucosa was otherwise normal. Retroflexed views revealed no abnormalities. The time to cecum=4 minutes 33 seconds.  Withdrawal time=11 minutes 55 seconds.  The scope was withdrawn and the procedure completed. COMPLICATIONS: There were no complications.  ENDOSCOPIC IMPRESSION: 1.   Sessile  polyp measuring 2 mm in size was found in the ascending colon; polypectomy was performed with cold forceps 2.   Moderate diverticulosis was noted in the sigmoid colon 3.   The colon mucosa was otherwise normal - excellent prep - first screening exam  RECOMMENDATIONS: Timing of repeat colonoscopy will be determined by pathology findings.   eSigned:  Gatha Mayer, MD, [facility] 01/25/2014 11:16 AM   cc: Kathyrn Lass, MD and The Patient   PATIENT NAME:  Dana Williams, Dana Williams MR#: [id number]

## 2014-01-25 NOTE — Progress Notes (Signed)
Called to room to assist during endoscopic procedure.  Patient ID and intended procedure confirmed with present staff. Received instructions for my participation in the procedure from the performing physician.  

## 2014-01-25 NOTE — Progress Notes (Signed)
  [facility] Post-op Note  Patient: Dana Williams  Procedure(s) Performed: colonoscopy  Patient Location: [facility] - Recovery Area  Anesthesia Type: Deep Sedation/Propofol  Level of Consciousness: awake, oriented and patient cooperative  Airway and Oxygen Therapy: Patient Spontanous Breathing  Post-op Pain: none  Post-op Assessment:  Post-op Vital signs reviewed, Patient's Cardiovascular Status Stable, Respiratory Function Stable, Patent Airway, No signs of Nausea or vomiting and Pain level controlled  Post-op Vital Signs: Reviewed and stable  Complications: No apparent anesthesia complications  EARGLE, BETH E 11:13 AM

## 2014-01-28 ENCOUNTER — Telehealth: Payer: Self-pay | Admitting: *Deleted

## 2014-01-28 NOTE — Telephone Encounter (Signed)
  Follow up Call-  Call back number 01/25/2014  Post procedure Call Back phone  # [phone]  Permission to leave phone message Yes  comments at [facility] leave message     Patient questions:  Left message to call us if necessary.

## 2014-01-29 ENCOUNTER — Encounter: Payer: Self-pay | Admitting: Internal Medicine

## 2014-01-29 DIAGNOSIS — Z860100 Personal history of colon polyps, unspecified: Secondary | ICD-10-CM

## 2014-01-29 HISTORY — DX: Personal history of colonic polyps: Z86.010

## 2014-01-29 HISTORY — DX: Personal history of colon polyps, unspecified: Z86.0100

## 2014-01-29 NOTE — Progress Notes (Signed)
Quick Note:  2 mm adenoma Repeat colon 2022 ______ 

## 2014-06-05 ENCOUNTER — Other Ambulatory Visit: Payer: Self-pay

## 2014-12-11 ENCOUNTER — Other Ambulatory Visit (HOSPITAL_COMMUNITY): Payer: Self-pay | Admitting: Obstetrics and Gynecology

## 2014-12-11 DIAGNOSIS — Z1231 Encounter for screening mammogram for malignant neoplasm of breast: Secondary | ICD-10-CM

## 2015-01-24 ENCOUNTER — Ambulatory Visit (HOSPITAL_COMMUNITY)
Admission: RE | Admit: 2015-01-24 | Discharge: 2015-01-24 | Disposition: A | Discharge status: Home / Self Care | Payer: 59 | Source: Ambulatory Visit | Attending: Obstetrics and Gynecology | Admitting: Obstetrics and Gynecology

## 2015-01-24 DIAGNOSIS — Z1231 Encounter for screening mammogram for malignant neoplasm of breast: Secondary | ICD-10-CM | POA: Insufficient documentation

## 2015-02-11 ENCOUNTER — Encounter: Payer: Self-pay | Admitting: Dietician

## 2015-02-11 ENCOUNTER — Encounter: Payer: 59 | Attending: Family Medicine | Admitting: Dietician

## 2015-02-11 VITALS — Ht 66.0 in | Wt 231.2 lb

## 2015-02-11 DIAGNOSIS — Z713 Dietary counseling and surveillance: Secondary | ICD-10-CM | POA: Diagnosis not present

## 2015-02-11 DIAGNOSIS — Z6837 Body mass index (BMI) 37.0-37.9, adult: Secondary | ICD-10-CM | POA: Insufficient documentation

## 2015-02-11 DIAGNOSIS — E669 Obesity, unspecified: Secondary | ICD-10-CM | POA: Insufficient documentation

## 2015-02-11 NOTE — Progress Notes (Signed)
  Medical Nutrition Therapy:  Appt start time: 1093 end time:  1830.   Assessment:  Primary concerns today: Dana Williams is here today since she gained about 16 lbs in the past year. She is an "emotional eater" and eats out of boredom. Would like to get down to 180 lbs. Will often grab something to eat on the way home from work instead of cooking. Her daughter recently moved out and she is living by herself.   Works 10 hours shift 4 x week and will do some overtime (usually 45-50 hours each week). Does not normally miss or skip meals, especially at work. Might might a meal on the weekend. Eats out every day usually with her daughter about 1 meal. Her mother just went into hospice and is not expected to live much longer. Has had stress taking care of her mother.   Tends to eat while watching TV at night "not good snacks" when she is not hungry. Will eat when she feels sad especially sweets.    Had lost weight with [employer] a year ago (then regained weight) and started back to following it more actively last week and lost 5 lbs.   Having trouble going to sleep and staying asleep and will be talking about this with her doctor tomorrow.   Preferred Learning Style:   No preference indicated   Learning Readiness:   Ready  MEDICATIONS: see list   DIETARY INTAKE:  Usual eating pattern includes 3 meals and 1-2 snacks per day.  Avoided foods include: brussels sprouts, spinach   24-hr recall:  B ( AM): 2 egg whites and 1 egg Snk ( AM): banana or apple  L ( PM): cafeteria soup or salad or grilled cheese, or will bring [place] sandwich with chips Snk ( PM): usually nothing D ( PM): wings, [employer], [place], [employer] Snk ( PM): sweets  Beverages: water, flavored water, 3-4 diet mountain dews, coffee with sugar free creamer  Usual physical activity: None  Estimated energy needs: 1600 calories 180 g carbohydrates 120 g protein 44 g fat  Progress Towards Goal(s):  In  progress.   Nutritional Diagnosis:  [place]-3.3 Overweight/obesity As related to hx of frequent restaurant meals and mindless eating at night.  As evidenced by BMI of 37.3.    Intervention:  Nutrition counseling provided. Plan: Plan to walk/bike 3-4 x week for about 45-60 minutes after work. Turn off the TV and electronics about 30-60 minutes before you want to be asleep. (Try taking bubble bath to relax.) Prepare lunches at night (before you sit down).  For lunch try bringing prepared salad greens or pre washed vegetables to have with sandwich. Try the little peppers (yellow and red). Limit restaurant meals to 2 x week. Aim to fill half of your plate with vegetables at lunch and dinner.  Have fruit with some nuts, peanut butter, cheese or other protein for snack.  Try not to have tempting food around at home. Think about eating snacks at the table at night instead of in front of the TV. Drink more water and less Diet [facility].  Teaching Method Utilized:  Visual Auditory Hands on  Handouts given during visit include:  Meal Card  15 g CHO Snack List  MyPlate Handout  Barriers to learning/adherence to lifestyle change: emotional eating, stress  Demonstrated degree of understanding via:  Teach Back   Monitoring/Evaluation:  Dietary intake, exercise, and body weight in 1 month(s).

## 2015-02-11 NOTE — Patient Instructions (Addendum)
Plan to walk/bike 3-4 x week for about 45-60 minutes after work. Turn off the TV and electronics about 30-60 minutes before you want to be asleep. (Try taking bubble bath to relax.) Prepare lunches at night (before you sit down).  For lunch try bringing prepared salad greens or pre washed vegetables to have with sandwich. Try the little peppers (yellow and red). Limit restaurant meals to 2 x week. Aim to fill half of your plate with vegetables at lunch and dinner.  Have fruit with some nuts, peanut butter, cheese or other protein for snack.  Try not to have tempting food around at home. Think about eating snacks at the table at night instead of in front of the TV. Drink more water and less Diet [facility].

## 2015-03-17 ENCOUNTER — Other Ambulatory Visit: Payer: Self-pay | Admitting: Urology

## 2015-03-17 ENCOUNTER — Ambulatory Visit: Payer: 59 | Admitting: Dietician

## 2015-03-28 ENCOUNTER — Ambulatory Visit: Payer: 59 | Admitting: Dietician

## 2015-04-11 ENCOUNTER — Encounter (HOSPITAL_BASED_OUTPATIENT_CLINIC_OR_DEPARTMENT_OTHER): Payer: Self-pay | Admitting: *Deleted

## 2015-04-15 ENCOUNTER — Encounter (HOSPITAL_BASED_OUTPATIENT_CLINIC_OR_DEPARTMENT_OTHER): Payer: Self-pay | Admitting: *Deleted

## 2015-04-15 NOTE — Progress Notes (Signed)
NPO AFTER MN.  ARRIVE AT 0600. NEEDS HG.  WILL TAKE NEXIUM AM DOS W/ SIPS OF WATER.

## 2015-04-18 ENCOUNTER — Encounter (HOSPITAL_BASED_OUTPATIENT_CLINIC_OR_DEPARTMENT_OTHER): Payer: Self-pay | Admitting: *Deleted

## 2015-04-21 ENCOUNTER — Encounter (HOSPITAL_BASED_OUTPATIENT_CLINIC_OR_DEPARTMENT_OTHER)
Admission: RE | Disposition: A | Discharge status: Home / Self Care | Payer: Self-pay | Source: Ambulatory Visit | Attending: Urology

## 2015-04-21 ENCOUNTER — Ambulatory Visit (HOSPITAL_BASED_OUTPATIENT_CLINIC_OR_DEPARTMENT_OTHER): Payer: 59 | Admitting: Anesthesiology

## 2015-04-21 ENCOUNTER — Encounter (HOSPITAL_BASED_OUTPATIENT_CLINIC_OR_DEPARTMENT_OTHER): Payer: Self-pay

## 2015-04-21 ENCOUNTER — Ambulatory Visit (HOSPITAL_BASED_OUTPATIENT_CLINIC_OR_DEPARTMENT_OTHER)
Admission: RE | Admit: 2015-04-21 | Discharge: 2015-04-21 | Disposition: A | Discharge status: Home / Self Care | Payer: 59 | Source: Ambulatory Visit | Attending: Urology | Admitting: Urology

## 2015-04-21 DIAGNOSIS — E785 Hyperlipidemia, unspecified: Secondary | ICD-10-CM | POA: Insufficient documentation

## 2015-04-21 DIAGNOSIS — E559 Vitamin D deficiency, unspecified: Secondary | ICD-10-CM | POA: Diagnosis not present

## 2015-04-21 DIAGNOSIS — K219 Gastro-esophageal reflux disease without esophagitis: Secondary | ICD-10-CM | POA: Insufficient documentation

## 2015-04-21 DIAGNOSIS — N200 Calculus of kidney: Secondary | ICD-10-CM | POA: Diagnosis not present

## 2015-04-21 DIAGNOSIS — Z6837 Body mass index (BMI) 37.0-37.9, adult: Secondary | ICD-10-CM | POA: Insufficient documentation

## 2015-04-21 DIAGNOSIS — Z9049 Acquired absence of other specified parts of digestive tract: Secondary | ICD-10-CM | POA: Diagnosis not present

## 2015-04-21 DIAGNOSIS — Z882 Allergy status to sulfonamides status: Secondary | ICD-10-CM | POA: Insufficient documentation

## 2015-04-21 DIAGNOSIS — Z86010 Personal history of colonic polyps: Secondary | ICD-10-CM | POA: Insufficient documentation

## 2015-04-21 DIAGNOSIS — E669 Obesity, unspecified: Secondary | ICD-10-CM | POA: Insufficient documentation

## 2015-04-21 HISTORY — DX: Calculus of kidney: N20.0

## 2015-04-21 HISTORY — DX: Diverticulosis of large intestine without perforation or abscess without bleeding: K57.30

## 2015-04-21 HISTORY — PX: CYSTOSCOPY WITH RETROGRADE PYELOGRAM, URETEROSCOPY AND STENT PLACEMENT: SHX5789

## 2015-04-21 HISTORY — DX: Plantar fascial fibromatosis: M72.2

## 2015-04-21 HISTORY — DX: Personal history of other diseases of the digestive system: Z87.19

## 2015-04-21 HISTORY — DX: Other specified postprocedural states: Z98.890

## 2015-04-21 HISTORY — PX: HOLMIUM LASER APPLICATION: SHX5852

## 2015-04-21 HISTORY — DX: Other specified postprocedural states: R11.2

## 2015-04-21 LAB — URINALYSIS, ROUTINE W REFLEX MICROSCOPIC
Bilirubin Urine: NEGATIVE
Glucose, UA: NEGATIVE mg/dL
Ketones, ur: NEGATIVE mg/dL
Leukocytes, UA: NEGATIVE
Nitrite: NEGATIVE
Protein, ur: 30 mg/dL — AB
Specific Gravity, Urine: 1.023 (ref 1.005–1.030)
Urobilinogen, UA: 0.2 mg/dL (ref 0.0–1.0)
pH: 6 (ref 5.0–8.0)

## 2015-04-21 LAB — URINE MICROSCOPIC-ADD ON

## 2015-04-21 LAB — POCT HEMOGLOBIN-HEMACUE: Hemoglobin: 12.7 g/dL (ref 12.0–15.0)

## 2015-04-21 SURGERY — CYSTOURETEROSCOPY, WITH RETROGRADE PYELOGRAM AND STENT INSERTION
Anesthesia: General | Site: Ureter | Laterality: Left

## 2015-04-21 MED ORDER — CEPHALEXIN 500 MG PO CAPS: 500.0000 mg | ORAL_CAPSULE | Freq: Two times a day (BID) | ORAL | Status: DC

## 2015-04-21 MED ORDER — CEFAZOLIN SODIUM 1-5 GM-% IV SOLN
1.0000 g | INTRAVENOUS | Status: DC
  Filled 2015-04-21: qty 50

## 2015-04-21 MED ORDER — FENTANYL CITRATE (PF) 100 MCG/2ML IJ SOLN
INTRAMUSCULAR | Status: DC | PRN
  Administered 2015-04-21 (×2): 25 ug via INTRAVENOUS
  Administered 2015-04-21: 50 ug via INTRAVENOUS
  Administered 2015-04-21: 100 ug via INTRAVENOUS

## 2015-04-21 MED ORDER — SCOPOLAMINE 1 MG/3DAYS TD PT72
MEDICATED_PATCH | TRANSDERMAL | Status: AC
  Filled 2015-04-21: qty 1

## 2015-04-21 MED ORDER — LIDOCAINE HCL (CARDIAC) 20 MG/ML IV SOLN
INTRAVENOUS | Status: DC | PRN
  Administered 2015-04-21: 100 mg via INTRAVENOUS

## 2015-04-21 MED ORDER — STERILE WATER FOR IRRIGATION IR SOLN
Status: DC | PRN
  Administered 2015-04-21: 500 mL

## 2015-04-21 MED ORDER — DEXAMETHASONE SODIUM PHOSPHATE 4 MG/ML IJ SOLN
INTRAMUSCULAR | Status: DC | PRN
  Administered 2015-04-21: 10 mg via INTRAVENOUS

## 2015-04-21 MED ORDER — URIBEL 118 MG PO CAPS: 1.0000 | ORAL_CAPSULE | Freq: Three times a day (TID) | ORAL | Status: DC | PRN

## 2015-04-21 MED ORDER — PROPOFOL 10 MG/ML IV BOLUS
INTRAVENOUS | Status: DC | PRN
  Administered 2015-04-21: 200 mg via INTRAVENOUS
  Administered 2015-04-21: 100 mg via INTRAVENOUS

## 2015-04-21 MED ORDER — CEFAZOLIN SODIUM-DEXTROSE 2-3 GM-% IV SOLR
INTRAVENOUS | Status: AC
  Filled 2015-04-21: qty 50

## 2015-04-21 MED ORDER — URELLE 81 MG PO TABS
1.0000 | ORAL_TABLET | Freq: Four times a day (QID) | ORAL | Status: DC
  Administered 2015-04-21: 81 mg via ORAL
  Filled 2015-04-21: qty 1

## 2015-04-21 MED ORDER — FENTANYL CITRATE (PF) 100 MCG/2ML IJ SOLN
INTRAMUSCULAR | Status: AC
  Filled 2015-04-21: qty 4

## 2015-04-21 MED ORDER — BELLADONNA ALKALOIDS-OPIUM 16.2-60 MG RE SUPP
RECTAL | Status: DC | PRN
  Administered 2015-04-21: 1 via RECTAL

## 2015-04-21 MED ORDER — OXYCODONE-ACETAMINOPHEN 5-325 MG PO TABS
ORAL_TABLET | ORAL | Status: AC
  Filled 2015-04-21: qty 1

## 2015-04-21 MED ORDER — ONDANSETRON HCL 4 MG/2ML IJ SOLN
INTRAMUSCULAR | Status: DC | PRN
  Administered 2015-04-21: 4 mg via INTRAVENOUS

## 2015-04-21 MED ORDER — MIDAZOLAM HCL 2 MG/2ML IJ SOLN
INTRAMUSCULAR | Status: AC
  Filled 2015-04-21: qty 2

## 2015-04-21 MED ORDER — ONDANSETRON HCL 4 MG/2ML IJ SOLN
4.0000 mg | Freq: Once | INTRAMUSCULAR | Status: DC | PRN
  Filled 2015-04-21: qty 2

## 2015-04-21 MED ORDER — IOHEXOL 350 MG/ML SOLN
INTRAVENOUS | Status: DC | PRN
  Administered 2015-04-21: 4 mL

## 2015-04-21 MED ORDER — OXYCODONE-ACETAMINOPHEN 5-325 MG PO TABS
1.0000 | ORAL_TABLET | ORAL | Status: DC | PRN
  Administered 2015-04-21: 1 via ORAL
  Filled 2015-04-21: qty 2

## 2015-04-21 MED ORDER — BELLADONNA ALKALOIDS-OPIUM 16.2-60 MG RE SUPP
RECTAL | Status: AC
  Filled 2015-04-21: qty 1

## 2015-04-21 MED ORDER — HYDROMORPHONE HCL 1 MG/ML IJ SOLN
0.2500 mg | INTRAMUSCULAR | Status: DC | PRN
  Administered 2015-04-21: 0.25 mg via INTRAVENOUS
  Administered 2015-04-21: 0.5 mg via INTRAVENOUS
  Administered 2015-04-21: 0.25 mg via INTRAVENOUS
  Filled 2015-04-21: qty 1

## 2015-04-21 MED ORDER — URELLE 81 MG PO TABS
ORAL_TABLET | ORAL | Status: AC
  Filled 2015-04-21: qty 1

## 2015-04-21 MED ORDER — CEFAZOLIN SODIUM-DEXTROSE 2-3 GM-% IV SOLR
2.0000 g | INTRAVENOUS | Status: AC
  Administered 2015-04-21: 2 g via INTRAVENOUS
  Filled 2015-04-21: qty 50

## 2015-04-21 MED ORDER — OXYCODONE-ACETAMINOPHEN 5-325 MG PO TABS: 1.0000 | ORAL_TABLET | ORAL | Status: DC | PRN

## 2015-04-21 MED ORDER — SODIUM CHLORIDE 0.9 % IR SOLN
Status: DC | PRN
  Administered 2015-04-21: 1000 mL
  Administered 2015-04-21: 3000 mL

## 2015-04-21 MED ORDER — LACTATED RINGERS IV SOLN
INTRAVENOUS | Status: DC
  Administered 2015-04-21 (×2): via INTRAVENOUS
  Filled 2015-04-21: qty 1000

## 2015-04-21 MED ORDER — MEPERIDINE HCL 25 MG/ML IJ SOLN
6.2500 mg | INTRAMUSCULAR | Status: DC | PRN
  Filled 2015-04-21: qty 1

## 2015-04-21 MED ORDER — MIDAZOLAM HCL 5 MG/5ML IJ SOLN
INTRAMUSCULAR | Status: DC | PRN
Start: 2015-04-21 — End: 2015-04-21
  Administered 2015-04-21: 2 mg via INTRAVENOUS

## 2015-04-21 MED ORDER — SCOPOLAMINE 1 MG/3DAYS TD PT72
1.0000 | MEDICATED_PATCH | TRANSDERMAL | Status: DC
  Administered 2015-04-21: 1 via TRANSDERMAL
  Administered 2015-04-21: 1.5 mg via TRANSDERMAL
  Filled 2015-04-21: qty 1

## 2015-04-21 MED ORDER — HYDROMORPHONE HCL 1 MG/ML IJ SOLN
INTRAMUSCULAR | Status: AC
  Filled 2015-04-21: qty 1

## 2015-04-21 SURGICAL SUPPLY — 33 items
ADAPTER CATH URET PLST 4-6FR (CATHETERS) IMPLANT
ADPR CATH URET STRL DISP 4-6FR (CATHETERS)
BAG DRAIN URO-CYSTO SKYTR STRL (DRAIN) ×2 IMPLANT
BAG DRN UROCATH (DRAIN) ×1
BASKET LASER NITINOL 1.9FR (BASKET) ×1 IMPLANT
BASKET STONE 1.7 NGAGE (UROLOGICAL SUPPLIES) ×1 IMPLANT
BSKT STON RTRVL 120 1.9FR (BASKET) ×1
CANISTER SUCT LVC 12 LTR MEDI- (MISCELLANEOUS) ×1 IMPLANT
CATH INTERMIT  6FR 70CM (CATHETERS) ×1 IMPLANT
CLOTH BEACON ORANGE TIMEOUT ST (SAFETY) ×2 IMPLANT
FIBER LASER TRAC TIP (UROLOGICAL SUPPLIES) ×1 IMPLANT
GLOVE BIO SURGEON STRL SZ 6.5 (GLOVE) ×1 IMPLANT
GLOVE BIO SURGEON STRL SZ8 (GLOVE) ×2 IMPLANT
GLOVE ECLIPSE 6.5 STRL STRAW (GLOVE) ×1 IMPLANT
GLOVE INDICATOR 6.5 STRL GRN (GLOVE) ×1 IMPLANT
GOWN STRL REUS W/ TWL LRG LVL3 (GOWN DISPOSABLE) ×1 IMPLANT
GOWN STRL REUS W/ TWL XL LVL3 (GOWN DISPOSABLE) ×1 IMPLANT
GOWN STRL REUS W/TWL LRG LVL3 (GOWN DISPOSABLE) ×2
GOWN STRL REUS W/TWL XL LVL3 (GOWN DISPOSABLE) ×2
GUIDEWIRE 0.038 PTFE COATED (WIRE) IMPLANT
GUIDEWIRE ANG ZIPWIRE 038X150 (WIRE) IMPLANT
GUIDEWIRE STR DUAL SENSOR (WIRE) ×1 IMPLANT
IV NS 1000ML (IV SOLUTION) ×2
IV NS 1000ML BAXH (IV SOLUTION) IMPLANT
IV NS IRRIG 3000ML ARTHROMATIC (IV SOLUTION) ×2 IMPLANT
KIT BALLIN UROMAX 15FX10 (LABEL) IMPLANT
LASER FIBER DISP (UROLOGICAL SUPPLIES) IMPLANT
NS IRRIG 500ML POUR BTL (IV SOLUTION) IMPLANT
PACK CYSTO (CUSTOM PROCEDURE TRAY) ×2 IMPLANT
SET HIGH PRES BAL DIL (LABEL) ×1
SHEATH ACCESS URETERAL 24CM (SHEATH) ×1 IMPLANT
STENT URET 6FRX24 CONTOUR (STENTS) ×1 IMPLANT
WATER STERILE IRR 500ML POUR (IV SOLUTION) ×1 IMPLANT

## 2015-04-21 NOTE — H&P (Signed)
  H&P  Chief Complaint: Kidney stone  History of Present Illness: Dana Williams is a 54 y.o. year old female who presents for ureteroscopic management of an 8 by 18 mm left renal stone. She has a prior history of calculous disease, and was last seen in our office 2 years ago for right flank pain. Evaluation at that time revealed a 7 mm left renal stone but no right sided stone to explain her right sided pain. She was recently seen about a month ago for routine followup. KUB revealed a large left interpolar stone. She presents now for ureteroscopic management.  Past Medical History  Diagnosis Date  . H/O psoriatic arthritis     DX  age 64---- in remission for 20 years  . GERD (gastroesophageal reflux disease)   . Hyperlipidemia   . Vitamin D deficiency   . History of kidney stones   . Personal history of colonic polyp - adenoma 01/29/2014  . History of hiatal hernia   . Sigmoid diverticulosis   . Renal calculus, left   . PONV (postoperative nausea and vomiting)   . Plantar fasciitis of left foot     Past Surgical History  Procedure Laterality Date  . Hand tendon surgery Right 1993  . Wisdom tooth extraction  age 54  . Rhinoplasty  2007  . Laparoscopic cholecystectomy  01-31-2002  . Strabismus surgery Left age 59  . Cystourethroscopy/  placement lynx suprapubic sling  05-24-2008  . Left ureteroscopic stone extraction/  stent placement  10-06-2005  . Cysto/  bilateral retrograde pyelogram/  bilateral ureteroscopic laser lithotripsy stone extractions/  bilateral stent placement  02-13-2010  . Closed manipulation shoulder    . Colonoscopy with propofol  01-25-2014    Home Medications:  No prescriptions prior to admission    Allergies:  Allergies  Allergen Reactions  . Sulfa Antibiotics Rash    Mouth ulcers    Family History  Problem Relation Age of Onset  . Stomach cancer Paternal Grandmother   . Colon cancer Neg Hx   . Esophageal cancer Neg Hx   . Rectal cancer Neg Hx      Social History:  reports that she has never smoked. She has never used smokeless tobacco. She reports that she does not drink alcohol or use illicit drugs.  ROS: A complete review of systems was performed.  All systems are negative except for pertinent findings as noted.  Physical Exam:  Vital signs in last 24 hours:   General:  Alert and oriented, No acute distress HEENT: Normocephalic, atraumatic Neck: No JVD or lymphadenopathy Cardiovascular: Regular rate and rhythm Lungs: Clear bilaterally Abdomen: Soft, nontender, nondistended, no abdominal masses Back: No CVA tenderness Extremities: No edema Neurologic: Grossly intact  Laboratory Data:  No results found for this or any previous visit (from the past 24 hour(s)). No results found for this or any previous visit (from the past 240 hour(s)). Creatinine: No results for input(s): CREATININE in the last 168 hours.  Radiologic Imaging: No results found.  Impression/Assessment:  8x18 mm left renal stone  Plan:  Cysto, left retrograde ureteropyelogram, left ureteroscopic stone management with holmium laser.  Jorja Loa 04/21/2015, 5:54 AM  Lillette Boxer. Dahlstedt MD

## 2015-04-21 NOTE — Anesthesia Procedure Notes (Signed)
Procedure Name: LMA Insertion Date/Time: 04/21/2015 7:43 AM Performed by: Wanita Chamberlain Pre-anesthesia Checklist: Patient identified, Timeout performed, Emergency Drugs available, Suction available and Patient being monitored Patient Re-evaluated:Patient Re-evaluated prior to inductionOxygen Delivery Method: Circle system utilized Preoxygenation: Pre-oxygenation with 100% oxygen Intubation Type: IV induction Ventilation: Mask ventilation without difficulty LMA: LMA inserted LMA Size: 4.0 Number of attempts: 1 Airway Equipment and Method: Bite block Placement Confirmation: positive ETCO2 and breath sounds checked- equal and bilateral Tube secured with: Tape Dental Injury: Teeth and Oropharynx as per pre-operative assessment

## 2015-04-21 NOTE — Anesthesia Postprocedure Evaluation (Signed)
Anesthesia Post Note  Patient: Dana Williams  Procedure(s) Performed: Procedure(s) (LRB): CYSTOSCOPY WITH RETROGRADE PYELOGRAM, URETEROSCOPY, STONE EXTRACTION AND STENT PLACEMENT (Left) HOLMIUM LASER APPLICATION (Left)  Anesthesia type: general  Patient location: PACU  Post pain: Pain level controlled  Post assessment: Patient's Cardiovascular Status Stable  Last Vitals:  Filed Vitals:   04/21/15 1126  BP: 145/87  Pulse: 68  Temp: 36.9 C  Resp: 18    Post vital signs: Reviewed and stable  Level of consciousness: sedated  Complications: No apparent anesthesia complications

## 2015-04-21 NOTE — Anesthesia Preprocedure Evaluation (Addendum)
Anesthesia Evaluation  Patient identified by MRN, date of birth, ID band Patient awake    Reviewed: Allergy & Precautions, NPO status , Patient's Chart, lab work & pertinent test results  History of Anesthesia Complications (+) PONV  Airway Mallampati: I  TM Distance: >3 FB Neck ROM: Full    Dental  (+) Dental Advisory Given, Teeth Intact   Pulmonary    Pulmonary exam normal       Cardiovascular Normal cardiovascular exam    Neuro/Psych    GI/Hepatic hiatal hernia, GERD-  Medicated and Controlled,  Endo/Other  Obese  Renal/GU Renal calculus     Musculoskeletal   Abdominal   Peds  Hematology   Anesthesia Other Findings   Reproductive/Obstetrics                          Anesthesia Physical Anesthesia Plan  ASA: II  Anesthesia Plan: General   Post-op Pain Management:    Induction: Intravenous  Airway Management Planned: LMA  Additional Equipment:   Intra-op Plan:   Post-operative Plan: Extubation in OR  Informed Consent: I have reviewed the patients History and Physical, chart, labs and discussed the procedure including the risks, benefits and alternatives for the proposed anesthesia with the patient or authorized representative who has indicated his/her understanding and acceptance.     Plan Discussed with: CRNA and Surgeon  Anesthesia Plan Comments:       Anesthesia Quick Evaluation

## 2015-04-21 NOTE — Op Note (Signed)
PATIENT:  Dana Williams  PRE-OPERATIVE DIAGNOSIS: large left renal calculus  POST-OPERATIVE DIAGNOSIS: Same  PROCEDURE: cystoscopy, left retrograde ureteropyelogram with interpretive fluoroscopy, left ureteroscopy with flexible digital ureteroscope, holmium laser energy applications of left renal stone, left renal stone extraction, double-J stent placement (6 [place] by 24 cm with string)  SURGEON:  Lillette Boxer. Dahlstedt, M.D.  ANESTHESIA:  General  EBL:  Minimal  DRAINS: 24 x 6 contour double-J stent with tether  LOCAL MEDICATIONS USED:  None  SPECIMEN:  Left renal calculi to patient's friend  INDICATION: CORTLYN CANNELL is a 54 year old female with an 8 x 15 mm left renal stone. Although this is asymptomatic, and has grown significantly over the past 2 years. She presents at this time for ureteroscopy with laser energy application his primary treatment modality. Other options including percutaneous nephrolithotomy and shockwave lithotripsy were discussed she is chosen the former. Risks and complications have been discussed with her, as well as a possible need for repeat procedure. She accepts these and desires to proceed  Description of procedure: The patient was properly identified and marked (if applicable) in the holding area. They were then  taken to the operating room and placed on the table in a supine position. General anesthesia was then administered. Once fully anesthetized the patient was moved to the dorsolithotomy position and the genitalia and perineum were sterilely prepped and draped in standard fashion. An official timeout was then performed.  A 23 French cystoscope was advanced in her bladder which was inspected circumferentially. There were no tumors trabeculations or foreign bodies. Left ureteral orifice was cannulated with a 6 [place] open-ended catheter. Gentle retrograde pyelogram was performed with Omnipaque. This revealed a filling defect in the interpolar calyx, no  other pyelocalyceal or ureteral abnormalities were identified. I then advanced a 0.038 inch sensor-tip guidewire up through the ureteral access catheter, and remove the catheter and the cystoscope once the guidewire was seen positioned in the renal pelvis. I then dilated the ureter, first with the inner core of a 12/14 ureteral access catheter. The entire sheath would not fit through, and I then dilated the entire ureter with a 10 cm, 15 French balloon dilator, from the UVJ to the renal pelvis. This went quite easily. I then advanced the ureteral access sheath up the ureter without difficulty. The guidewire and inner core were removed. I then passed the flexible ureteroscope up through the access sheath. Calyceal system was identified and inspected. No stones were identifiedany of the calyces except for the interpolar calyx where there was a bifurcated stone present.  I then used a 200 in with the laser energy set atrepetition rate of 60 and a power of 0.2 J. I then fragmented the previous mentioned bifid stone into multiple smaller fragments. These were all extracted either with the escape or the engage basket by direct vision, through the ureteral access sheath. Careful inspection of all the pyelocalyceal systems after extraction of fragments revealed same like fragments were too small for grasping. No significant fragments were seen at this point. The scope was then removed. I passed a 0.038 inch sensor-tip guidewire through the access sheath which was then removed. The wire was backloaded through the scope, and I then passed a 24 cm 6 [place] contour double-J stent, the string having been left on. Good proximal and distal curls were seen on the stent following wire removal using fluoroscopic and cystoscopic guidance, respectively. The bladder was then drained. The scope was removed. The string was brought  to the patient's urethra, trimmed, and taped to the patient's inner thigh. A B&O suppository was placed. The  patient was then awakened and taken to PACU in stable condition. She tolerated the procedure well. Stone fragments were given to her friend.    PLAN OF CARE: Discharge to home after PACU  PATIENT DISPOSITION:  PACU - hemodynamically stable.

## 2015-04-21 NOTE — Discharge Instructions (Signed)
[facility] [phone] Post Ureteroscopy With or Without Stent Instructions  Definitions:  Ureter: The duct that transports urine from the kidney to the bladder. Stent:   A plastic hollow tube that is placed into the ureter, from the kidney to the                 bladder to prevent the ureter from swelling shut.  GENERAL INSTRUCTIONS:  Despite the fact that no skin incisions were used, the area around the ureter and bladder is raw and irritated. The stent is a foreign body which will further irritate the bladder wall. This irritation is manifested by increased frequency of urination, both day and night, and by an increase in the urge to urinate. In some, the urge to urinate is present almost always. Sometimes the urge is strong enough that you may not be able to stop yourself from urinating. The only real cure is to remove the stent and then give time for the bladder wall to heal which can't be done until the danger of the ureter swelling shut has passed, which varies.  It is okay to pull the string to remove the stent on Thursday morning.  You may see some blood in your urine while the stent is in place and a few days afterwards. Do not be alarmed, even if the urine was clear for a while. Get off your feet and drink lots of fluids until clearing occurs. If you start to pass clots or don't improve, call us.  DIET: You may return to your normal diet immediately. Because of the raw surface of your bladder, alcohol, spicy foods, acid type foods and drinks with caffeine may cause irritation or frequency and should be used in moderation. To keep your urine flowing freely and to avoid constipation, drink plenty of fluids during the day ( 8-10 glasses ). Tip: Avoid cranberry juice because it is very acidic.  ACTIVITY: Your physical activity doesn't need to be restricted. However, if you are very active, you may see some blood in your urine. We suggest that you reduce your activity  under these circumstances until the bleeding has stopped.  BOWELS: It is important to keep your bowels regular during the postoperative period. Straining with bowel movements can cause bleeding. A bowel movement every other day is reasonable. Use a mild laxative if needed, such as Milk of Magnesia 2-3 tablespoons, or 2 Dulcolax tablets. Call if you continue to have problems. If you have been taking narcotics for pain, before, during or after your surgery, you may be constipated. Take a laxative if necessary.   MEDICATION: You should resume your pre-surgery medications unless told not to. In addition you will often be given an antibiotic to prevent infection. These should be taken as prescribed until the bottles are finished unless you are having an unusual reaction to one of the drugs.  PROBLEMS YOU SHOULD REPORT TO [name]:  Fevers over 100.5 Fahrenheit.  Heavy bleeding, or clots ( See above notes about blood in urine ).  Inability to urinate.  Drug reactions ( hives, rash, nausea, vomiting, diarrhea ).  Severe burning or pain with urination that is not improving.  FOLLOW-UP: You will need a follow-up appointment to monitor your progress. Call for this appointment at the number listed above. Usually the first appointment will be about three to fourteen days after your surgery.      Post Anesthesia Home Care Instructions  Activity: Get plenty of rest for the remainder of the  day. A responsible adult should stay with you for 24 hours following the procedure.  For the next 24 hours, DO NOT: -Drive a car -Paediatric nurse -Drink alcoholic beverages -Take any medication unless instructed by your physician -Make any legal decisions or sign important papers.  Meals: Start with liquid foods such as gelatin or soup. Progress to regular foods as tolerated. Avoid greasy, spicy, heavy foods. If nausea and/or vomiting occur, drink only clear liquids until the nausea and/or vomiting subsides.  Call your physician if vomiting continues.  Special Instructions/Symptoms: Your throat may feel dry or sore from the anesthesia or the breathing tube placed in your throat during surgery. If this causes discomfort, gargle with warm salt water. The discomfort should disappear within 24 hours.  If you had a scopolamine patch placed behind your ear for the management of post- operative nausea and/or vomiting:  1. The medication in the patch is effective for 72 hours, after which it should be removed.  Wrap patch in a tissue and discard in the trash. Wash hands thoroughly with soap and water. 2. You may remove the patch earlier than 72 hours if you experience unpleasant side effects which may include dry mouth, dizziness or visual disturbances. 3. Avoid touching the patch. Wash your hands with soap and water after contact with the patch.

## 2015-04-21 NOTE — Transfer of Care (Signed)
Immediate Anesthesia Transfer of Care Note  Patient: Dana Williams  Procedure(s) Performed: Procedure(s): CYSTOSCOPY WITH RETROGRADE PYELOGRAM, URETEROSCOPY, STONE EXTRACTION AND STENT PLACEMENT (Left) HOLMIUM LASER APPLICATION (Left)  Patient Location: PACU  Anesthesia Type:General  Level of Consciousness: awake, alert , oriented and patient cooperative  Airway & Oxygen Therapy: Patient Spontanous Breathing and Patient connected to nasal cannula oxygen  Post-op Assessment: Report given to RN and Post -op Vital signs reviewed and stable  Post vital signs: Reviewed and stable  Last Vitals:  Filed Vitals:   04/21/15 0945  BP: 142/84  Pulse: 83  Temp:   Resp: 18    Complications: No apparent anesthesia complications

## 2015-04-22 ENCOUNTER — Encounter (HOSPITAL_BASED_OUTPATIENT_CLINIC_OR_DEPARTMENT_OTHER): Payer: Self-pay | Admitting: Urology

## 2015-06-18 ENCOUNTER — Other Ambulatory Visit: Payer: Self-pay | Admitting: Obstetrics and Gynecology

## 2015-06-19 LAB — CYTOLOGY - PAP

## 2015-11-28 MED FILL — ESOMEPRAZOLE MAG DR 40 MG C: 40 | 90 days supply | Qty: 90 | Fill #0

## 2015-11-28 MED FILL — valACYclovir HCL 1 GM TABS: 1 | 15 days supply | Qty: 30 | Fill #0

## 2015-12-01 MED FILL — AMMONIUM LACTATE 12% LOTION: 12 | 29 days supply | Qty: 800 | Fill #0

## 2015-12-01 MED FILL — CELECOXIB 200 MG CAPSULE: 200 | 30 days supply | Qty: 60 | Fill #1

## 2015-12-22 MED FILL — CLIMARA PRO PATCH: 0.045-0.015 | 83 days supply | Qty: 12 | Fill #3

## 2015-12-23 MED FILL — AMOXICILLIN 500 MG CAPSULE: 500 | 10 days supply | Qty: 30 | Fill #0

## 2015-12-24 MED FILL — VIT D2 1.25 MG (50,000 UNIT: 1.25 MG | 84 days supply | Qty: 12 | Fill #0

## 2016-01-01 MED FILL — CELECOXIB 200 MG CAPSULE: 200 | 30 days supply | Qty: 60 | Fill #2

## 2016-01-23 DIAGNOSIS — Z1231 Encounter for screening mammogram for malignant neoplasm of breast: Secondary | ICD-10-CM | POA: Diagnosis not present

## 2016-01-23 DIAGNOSIS — Z13820 Encounter for screening for osteoporosis: Secondary | ICD-10-CM | POA: Diagnosis not present

## 2016-01-28 ENCOUNTER — Other Ambulatory Visit (HOSPITAL_COMMUNITY): Payer: Self-pay | Admitting: Family Medicine

## 2016-01-28 ENCOUNTER — Ambulatory Visit (HOSPITAL_COMMUNITY)
Admission: RE | Admit: 2016-01-28 | Discharge: 2016-01-28 | Disposition: A | Discharge status: Home / Self Care | Payer: 59 | Source: Ambulatory Visit | Attending: Family Medicine | Admitting: Family Medicine

## 2016-01-28 DIAGNOSIS — E559 Vitamin D deficiency, unspecified: Secondary | ICD-10-CM | POA: Diagnosis not present

## 2016-01-28 DIAGNOSIS — Z6830 Body mass index (BMI) 30.0-30.9, adult: Secondary | ICD-10-CM | POA: Diagnosis not present

## 2016-01-28 DIAGNOSIS — R52 Pain, unspecified: Secondary | ICD-10-CM | POA: Diagnosis not present

## 2016-01-28 DIAGNOSIS — Z713 Dietary counseling and surveillance: Secondary | ICD-10-CM | POA: Diagnosis not present

## 2016-01-28 DIAGNOSIS — M79605 Pain in left leg: Secondary | ICD-10-CM | POA: Diagnosis present

## 2016-01-28 DIAGNOSIS — K219 Gastro-esophageal reflux disease without esophagitis: Secondary | ICD-10-CM | POA: Diagnosis not present

## 2016-01-28 DIAGNOSIS — E669 Obesity, unspecified: Secondary | ICD-10-CM | POA: Diagnosis not present

## 2016-01-28 DIAGNOSIS — E7800 Pure hypercholesterolemia, unspecified: Secondary | ICD-10-CM | POA: Diagnosis not present

## 2016-01-28 NOTE — Progress Notes (Signed)
Preliminary results by tech - Left Lower Ext. Venous Duplex Completed. Negative for deep and superficial vein thrombosis, and Baker's Cyst in the left leg. Oda Cogan, BS, RDMS, RVT

## 2016-02-16 MED FILL — ESZOPICLONE 2 MG TABLET: 2 | 30 days supply | Qty: 30 | Fill #0

## 2016-02-18 MED FILL — AMMONIUM LACTATE 12% LOTION: 12 | 29 days supply | Qty: 800 | Fill #1

## 2016-02-19 MED FILL — ESOMEPRAZOLE MAG DR 40 MG C: 40 | 90 days supply | Qty: 90 | Fill #0

## 2016-03-17 DIAGNOSIS — N950 Postmenopausal bleeding: Secondary | ICD-10-CM | POA: Diagnosis not present

## 2016-03-19 MED FILL — CELECOXIB 200 MG CAPSULE: 200 | 30 days supply | Qty: 60 | Fill #3

## 2016-03-19 MED FILL — CLIMARA PRO PATCH: 0.045-0.015 | 83 days supply | Qty: 12 | Fill #4

## 2016-04-01 DIAGNOSIS — N950 Postmenopausal bleeding: Secondary | ICD-10-CM | POA: Diagnosis not present

## 2016-04-06 MED FILL — NORETHIN-ETH ESTRAD 1 MG-5: 1-5 | 28 days supply | Qty: 28 | Fill #0

## 2016-04-14 ENCOUNTER — Other Ambulatory Visit (HOSPITAL_COMMUNITY): Payer: Self-pay | Admitting: Rheumatology

## 2016-04-14 ENCOUNTER — Ambulatory Visit (HOSPITAL_COMMUNITY)
Admission: RE | Admit: 2016-04-14 | Discharge: 2016-04-14 | Disposition: A | Discharge status: Home / Self Care | Payer: 59 | Source: Ambulatory Visit | Attending: Rheumatology | Admitting: Rheumatology

## 2016-04-14 DIAGNOSIS — T451X5A Adverse effect of antineoplastic and immunosuppressive drugs, initial encounter: Secondary | ICD-10-CM

## 2016-04-14 DIAGNOSIS — M25551 Pain in right hip: Secondary | ICD-10-CM | POA: Diagnosis not present

## 2016-04-14 DIAGNOSIS — L408 Other psoriasis: Secondary | ICD-10-CM | POA: Diagnosis not present

## 2016-04-14 DIAGNOSIS — Z9225 Personal history of immunosupression therapy: Secondary | ICD-10-CM | POA: Diagnosis not present

## 2016-04-14 DIAGNOSIS — M79642 Pain in left hand: Secondary | ICD-10-CM | POA: Diagnosis not present

## 2016-04-14 DIAGNOSIS — L400 Psoriasis vulgaris: Secondary | ICD-10-CM | POA: Diagnosis not present

## 2016-04-14 DIAGNOSIS — M2550 Pain in unspecified joint: Secondary | ICD-10-CM | POA: Diagnosis not present

## 2016-04-14 DIAGNOSIS — Z79899 Other long term (current) drug therapy: Secondary | ICD-10-CM | POA: Diagnosis not present

## 2016-04-14 DIAGNOSIS — R5381 Other malaise: Secondary | ICD-10-CM | POA: Diagnosis not present

## 2016-04-14 DIAGNOSIS — M79671 Pain in right foot: Secondary | ICD-10-CM | POA: Diagnosis not present

## 2016-04-14 DIAGNOSIS — M79672 Pain in left foot: Secondary | ICD-10-CM | POA: Diagnosis not present

## 2016-04-14 DIAGNOSIS — M25562 Pain in left knee: Secondary | ICD-10-CM | POA: Diagnosis not present

## 2016-04-14 DIAGNOSIS — R52 Pain, unspecified: Secondary | ICD-10-CM | POA: Diagnosis not present

## 2016-04-14 DIAGNOSIS — L409 Psoriasis, unspecified: Secondary | ICD-10-CM | POA: Diagnosis not present

## 2016-04-14 DIAGNOSIS — M79641 Pain in right hand: Secondary | ICD-10-CM | POA: Diagnosis not present

## 2016-04-14 DIAGNOSIS — M25561 Pain in right knee: Secondary | ICD-10-CM | POA: Diagnosis not present

## 2016-04-26 DIAGNOSIS — F329 Major depressive disorder, single episode, unspecified: Secondary | ICD-10-CM | POA: Diagnosis not present

## 2016-04-26 MED FILL — BUPROPION HCL XL 150 MG TAB: 150 | 30 days supply | Qty: 30 | Fill #0

## 2016-05-05 DIAGNOSIS — L408 Other psoriasis: Secondary | ICD-10-CM | POA: Diagnosis not present

## 2016-05-05 DIAGNOSIS — M25561 Pain in right knee: Secondary | ICD-10-CM | POA: Diagnosis not present

## 2016-05-05 DIAGNOSIS — M79641 Pain in right hand: Secondary | ICD-10-CM | POA: Diagnosis not present

## 2016-05-05 DIAGNOSIS — M25562 Pain in left knee: Secondary | ICD-10-CM | POA: Diagnosis not present

## 2016-05-05 DIAGNOSIS — M25551 Pain in right hip: Secondary | ICD-10-CM | POA: Diagnosis not present

## 2016-05-05 DIAGNOSIS — M79642 Pain in left hand: Secondary | ICD-10-CM | POA: Diagnosis not present

## 2016-05-06 DIAGNOSIS — D1801 Hemangioma of skin and subcutaneous tissue: Secondary | ICD-10-CM | POA: Diagnosis not present

## 2016-05-06 DIAGNOSIS — L718 Other rosacea: Secondary | ICD-10-CM | POA: Diagnosis not present

## 2016-05-06 DIAGNOSIS — Z85828 Personal history of other malignant neoplasm of skin: Secondary | ICD-10-CM | POA: Diagnosis not present

## 2016-05-06 DIAGNOSIS — L400 Psoriasis vulgaris: Secondary | ICD-10-CM | POA: Diagnosis not present

## 2016-05-06 DIAGNOSIS — L814 Other melanin hyperpigmentation: Secondary | ICD-10-CM | POA: Diagnosis not present

## 2016-05-06 DIAGNOSIS — D225 Melanocytic nevi of trunk: Secondary | ICD-10-CM | POA: Diagnosis not present

## 2016-05-06 DIAGNOSIS — L821 Other seborrheic keratosis: Secondary | ICD-10-CM | POA: Diagnosis not present

## 2016-05-06 MED FILL — TACLONEX 0.005%-0.064% SUSP: 0.005-0.064 | 30 days supply | Qty: 120 | Fill #0

## 2016-05-06 MED FILL — AMMONIUM LACTATE 12% LOTION: 12 | 30 days supply | Qty: 800 | Fill #0

## 2016-05-06 MED FILL — FINACEA 15% GEL: 15 | 30 days supply | Qty: 50 | Fill #0

## 2016-05-06 MED FILL — CALCIPOTRIENE-BETAMETH DP O: 0.005-0.064 | 30 days supply | Qty: 60 | Fill #0

## 2016-05-11 MED FILL — CELECOXIB 200 MG CAPSULE: 200 | 30 days supply | Qty: 60 | Fill #4

## 2016-05-11 MED FILL — ESOMEPRAZOLE MAG DR 40 MG C: 40 | 90 days supply | Qty: 90 | Fill #1

## 2016-05-11 MED FILL — NORETHIN-ETH ESTRAD 1 MG-5: 1-5 | 28 days supply | Qty: 28 | Fill #1

## 2016-05-13 DIAGNOSIS — M1712 Unilateral primary osteoarthritis, left knee: Secondary | ICD-10-CM | POA: Diagnosis not present

## 2016-05-20 MED FILL — ZALEPLON 5 MG CAPSULE: 5 | 30 days supply | Qty: 30 | Fill #0

## 2016-05-21 MED FILL — BUPROPION HCL XL 150 MG TAB: 150 | 30 days supply | Qty: 30 | Fill #1

## 2016-06-09 DIAGNOSIS — M1712 Unilateral primary osteoarthritis, left knee: Secondary | ICD-10-CM | POA: Diagnosis not present

## 2016-06-16 DIAGNOSIS — M1712 Unilateral primary osteoarthritis, left knee: Secondary | ICD-10-CM | POA: Diagnosis not present

## 2016-06-16 MED FILL — BUPROPION HCL XL 150 MG TAB: 150 | 30 days supply | Qty: 30 | Fill #0

## 2016-06-17 MED FILL — NORETHIN-ETH ESTRAD 1 MG-5: 1-5 | 84 days supply | Qty: 84 | Fill #2

## 2016-07-07 DIAGNOSIS — M1712 Unilateral primary osteoarthritis, left knee: Secondary | ICD-10-CM | POA: Diagnosis not present

## 2016-07-08 DIAGNOSIS — F329 Major depressive disorder, single episode, unspecified: Secondary | ICD-10-CM | POA: Diagnosis not present

## 2016-07-08 DIAGNOSIS — E7800 Pure hypercholesterolemia, unspecified: Secondary | ICD-10-CM | POA: Diagnosis not present

## 2016-07-08 MED FILL — BUPROPION HCL XL 300 MG TAB: 300 | 30 days supply | Qty: 30 | Fill #0

## 2016-07-26 MED FILL — CELECOXIB 200 MG CAPSULE: 200 | 30 days supply | Qty: 60 | Fill #5

## 2016-08-02 MED FILL — VIT D2 1.25 MG (50,000 UNIT: 1.25 MG | 84 days supply | Qty: 12 | Fill #1

## 2016-08-03 MED FILL — FLUCONAZOLE 150 MG TABLET: 150 | 3 days supply | Qty: 2 | Fill #0

## 2016-08-09 MED FILL — TERCONAZOLE 0.4% VAG CREAM: 0.4 | 7 days supply | Qty: 45 | Fill #0

## 2016-08-12 MED FILL — BETAMETHASONE VA 0.1% CREAM: 0.1 | 14 days supply | Qty: 15 | Fill #0

## 2016-08-19 MED FILL — AMMONIUM LACTATE 12% LOTION: 12 | 30 days supply | Qty: 800 | Fill #1

## 2016-08-19 MED FILL — BUPROPION HCL XL 300 MG TAB: 300 | 30 days supply | Qty: 30 | Fill #0

## 2016-08-25 DIAGNOSIS — H52223 Regular astigmatism, bilateral: Secondary | ICD-10-CM | POA: Diagnosis not present

## 2016-08-25 DIAGNOSIS — H5213 Myopia, bilateral: Secondary | ICD-10-CM | POA: Diagnosis not present

## 2016-08-25 DIAGNOSIS — M7071 Other bursitis of hip, right hip: Secondary | ICD-10-CM | POA: Diagnosis not present

## 2016-08-31 DIAGNOSIS — F329 Major depressive disorder, single episode, unspecified: Secondary | ICD-10-CM | POA: Diagnosis not present

## 2016-08-31 DIAGNOSIS — Z01818 Encounter for other preprocedural examination: Secondary | ICD-10-CM | POA: Diagnosis not present

## 2016-08-31 DIAGNOSIS — R9431 Abnormal electrocardiogram [ECG] [EKG]: Secondary | ICD-10-CM | POA: Diagnosis not present

## 2016-08-31 DIAGNOSIS — M179 Osteoarthritis of knee, unspecified: Secondary | ICD-10-CM | POA: Diagnosis not present

## 2016-09-20 MED FILL — CELECOXIB 200 MG CAPSULE: 200 | 30 days supply | Qty: 60 | Fill #0

## 2016-09-20 MED FILL — ESOMEPRAZOLE MAG DR 40 MG C: 40 | 90 days supply | Qty: 90 | Fill #0

## 2016-09-23 MED FILL — AMOXICILLIN 500 MG CAPSULE: 500 | 7 days supply | Qty: 28 | Fill #0

## 2016-09-24 MED FILL — BUPROPION HCL XL 300 MG TAB: 300 | 90 days supply | Qty: 90 | Fill #0

## 2016-09-24 MED FILL — NORETHIN-ETH ESTRAD 1 MG-5: 1-5 | 84 days supply | Qty: 84 | Fill #3

## 2016-10-12 DIAGNOSIS — M7071 Other bursitis of hip, right hip: Secondary | ICD-10-CM | POA: Diagnosis not present

## 2016-10-12 MED FILL — ASPIRIN EC 325 MG TABLET: 325 | 30 days supply | Qty: 30 | Fill #0

## 2016-10-12 MED FILL — OXYCODONE/APAP 5/325 MG TAB: 5-325 | 5 days supply | Qty: 60 | Fill #0

## 2016-10-12 MED FILL — ONDANSETRON HCL 4 MG TABLET: 4 | 20 days supply | Qty: 60 | Fill #0

## 2016-10-12 MED FILL — METHOCARBAMOL 500 MG TABLET: 500 | 20 days supply | Qty: 60 | Fill #0

## 2016-10-12 NOTE — H&P (Signed)
TOTAL KNEE ADMISSION H&P  Patient is being admitted for left total knee arthroplasty.  Subjective:  Chief Complaint:left knee pain.  HPI: Dana Williams, 55 y.o. female, has a history of pain and functional disability in the left knee due to arthritis and has failed non-surgical conservative treatments for greater than 12 weeks to includeNSAID's and/or analgesics, corticosteriod injections and viscosupplementation injections.  Onset of symptoms was gradual, starting 5 years ago with stable course since that time. The patient noted no past surgery on the left knee(s).  Patient currently rates pain in the left knee(s) at 5 out of 10 with activity. Patient has night pain, worsening of pain with activity and weight bearing, pain that interferes with activities of daily living and crepitus.  Patient has evidence of subchondral sclerosis and joint space narrowing by imaging studies. There is no active infection.  Patient Active Problem List   Diagnosis Date Noted  . Personal history of colonic polyp - adenoma 01/29/2014  . Hyperlipidemia 04/18/2012   Past Medical History:  Diagnosis Date  . GERD (gastroesophageal reflux disease)   . H/O psoriatic arthritis    DX  age 15---- in remission for 20 years  . History of hiatal hernia   . History of kidney stones   . Hyperlipidemia   . Personal history of colonic polyp - adenoma 01/29/2014  . Plantar fasciitis of left foot   . PONV (postoperative nausea and vomiting)   . Renal calculus, left   . Sigmoid diverticulosis   . Vitamin D deficiency     Past Surgical History:  Procedure Laterality Date  . CLOSED MANIPULATION SHOULDER    . COLONOSCOPY WITH PROPOFOL  01-25-2014  . CYSTO/  BILATERAL RETROGRADE PYELOGRAM/  BILATERAL URETEROSCOPIC LASER LITHOTRIPSY STONE EXTRACTIONS/  BILATERAL STENT PLACEMENT  02-13-2010  . CYSTOSCOPY WITH RETROGRADE PYELOGRAM, URETEROSCOPY AND STENT PLACEMENT Left 04/21/2015   Procedure: CYSTOSCOPY WITH RETROGRADE  PYELOGRAM, URETEROSCOPY, STONE EXTRACTION AND STENT PLACEMENT;  Surgeon: Franchot Gallo, MD;  Location: [facility];  Service: Urology;  Laterality: Left;  . CYSTOURETHROSCOPY/  PLACEMENT LYNX SUPRAPUBIC SLING  05-24-2008  . HAND TENDON SURGERY Right 1993  . HOLMIUM LASER APPLICATION Left [id number]   Procedure: HOLMIUM LASER APPLICATION;  Surgeon: Franchot Gallo, MD;  Location: [facility];  Service: Urology;  Laterality: Left;  . LAPAROSCOPIC CHOLECYSTECTOMY  01-31-2002  . LEFT URETEROSCOPIC STONE EXTRACTION/  STENT PLACEMENT  10-06-2005  . RHINOPLASTY  2007  . STRABISMUS SURGERY Left age 32  . WISDOM TOOTH EXTRACTION  age 1    No prescriptions prior to admission.   Allergies  Allergen Reactions  . Sulfa Antibiotics Rash    Mouth ulcers    Social History  Substance Use Topics  . Smoking status: Never Smoker  . Smokeless tobacco: Never Used  . Alcohol use No    Family History  Problem Relation Age of Onset  . Stomach cancer Paternal Grandmother   . Colon cancer Neg Hx   . Esophageal cancer Neg Hx   . Rectal cancer Neg Hx      Review of Systems  Constitutional: Negative.   HENT: Negative.   Eyes: Negative.   Respiratory: Negative.   Cardiovascular: Negative.   Gastrointestinal: Negative.   Genitourinary: Negative.   Musculoskeletal: Positive for joint pain.  Skin: Negative.   Neurological: Negative.   Endo/Heme/Allergies: Negative.   Psychiatric/Behavioral: Negative.     Objective:  Physical Exam  Constitutional: She is oriented to person, place, and time. She appears  well-developed and well-nourished.  HENT:  Head: Normocephalic and atraumatic.  Eyes: EOM are normal. Pupils are equal, round, and reactive to light.  Neck: Normal range of motion. Neck supple.  Cardiovascular: Normal rate and regular rhythm.   Respiratory: Effort normal and breath sounds normal.  GI: Soft. Bowel sounds are normal.  Musculoskeletal:   Examination of her left knee reveals moderate varus deformity.  Range of motion from 5-100 degrees.  Medial joint line tenderness.  Moderate patellofemoral crepitus.  She is neurovascularly intact distally.     Neurological: She is alert and oriented to person, place, and time.  Skin: Skin is warm and dry.  Psychiatric: She has a normal mood and affect. Her behavior is normal. Judgment and thought content normal.    Vital signs in last 24 hours:    Labs:   Estimated body mass index is 37.12 kg/m as calculated from the following:   Height as of 04/21/15: 5\' 6"  (1.676 m).   Weight as of 04/21/15: 104.3 kg (230 lb).   Imaging Review Plain radiographs demonstrate severe degenerative joint disease of the left knee(s). The overall alignment ismild varus. The bone quality appears to be fair for age and reported activity level.  Assessment/Plan:  End stage arthritis, left knee   The patient history, physical examination, clinical judgment of the provider and imaging studies are consistent with end stage degenerative joint disease of the left knee(s) and total knee arthroplasty is deemed medically necessary. The treatment options including medical management, injection therapy arthroscopy and arthroplasty were discussed at length. The risks and benefits of total knee arthroplasty were presented and reviewed. The risks due to aseptic loosening, infection, stiffness, patella tracking problems, thromboembolic complications and other imponderables were discussed. The patient acknowledged the explanation, agreed to proceed with the plan and consent was signed. Patient is being admitted for inpatient treatment for surgery, pain control, PT, OT, prophylactic antibiotics, VTE prophylaxis, progressive ambulation and ADL's and discharge planning. The patient is planning to be discharged home with home health services

## 2016-10-18 ENCOUNTER — Inpatient Hospital Stay (HOSPITAL_COMMUNITY): Admission: RE | Admit: 2016-10-18 | Payer: 59 | Source: Ambulatory Visit

## 2016-10-19 NOTE — Pre-Procedure Instructions (Signed)
Dana Williams  10/19/2016      [place], [place] - [address] [place] [address] Phone: [phone] Fax: [phone]    Your procedure is scheduled on 10/27/16  Report to [facility] Admitting at 845 A.M.  Call this number if you have problems the morning of surgery:  [phone]   Remember:  Do not eat food or drink liquids after midnight.  Take these medicines the morning of surgery with A SIP OF WATER  Bupropion(wellbutrin),nexium, estradiol(femhrt)  STOP all herbel meds, nsaids (aleve,naproxen,advil,ibuprofen) starting Today including aspirin, all vitamins, celebrex    Do not wear jewelry, make-up or nail polish.  Do not wear lotions, powders, or perfumes, or deoderant.  Do not shave 48 hours prior to surgery.  Men may shave face and neck.  Do not bring valuables to the hospital.  [facility] is not responsible for any belongings or valuables.  Contacts, dentures or bridgework may not be worn into surgery.  Leave your suitcase in the car.  After surgery it may be brought to your room.  For patients admitted to the hospital, discharge time will be determined by your treatment team.  Patients discharged the day of surgery will not be allowed to drive home.   Special instructions:  Special Instructions: [name] - Preparing for Surgery  Before surgery, you can play an important role.  Because skin is not sterile, your skin needs to be as free of germs as possible.  You can reduce the number of germs on you skin by washing with CHG (chlorahexidine gluconate) soap before surgery.  CHG is an antiseptic cleaner which kills germs and bonds with the skin to continue killing germs even after washing.  Please DO NOT use if you have an allergy to CHG or antibacterial soaps.  If your skin becomes reddened/irritated stop using the CHG and inform your nurse when you arrive at Short Stay.  Do not shave  (including legs and underarms) for at least 48 hours prior to the first CHG shower.  You may shave your face.  Please follow these instructions carefully:   1.  Shower with CHG Soap the night before surgery and the morning of Surgery.  2.  If you choose to wash your hair, wash your hair first as usual with your normal shampoo.  3.  After you shampoo, rinse your hair and body thoroughly to remove the Shampoo.  4.  Use CHG as you would any other liquid soap.  You can apply chg directly  to the skin and wash gently with scrungie or a clean washcloth.  5.  Apply the CHG Soap to your body ONLY FROM THE NECK DOWN.  Do not use on open wounds or open sores.  Avoid contact with your eyes ears, mouth and genitals (private parts).  Wash genitals (private parts)       with your normal soap.  6.  Wash thoroughly, paying special attention to the area where your surgery will be performed.  7.  Thoroughly rinse your body with warm water from the neck down.  8.  DO NOT shower/wash with your normal soap after using and rinsing off the CHG Soap.  9.  Pat yourself dry with a clean towel.            10.  Wear clean pajamas.            11.  Place clean sheets  on your bed the night of your first shower and do not sleep with pets.  Day of Surgery  Do not apply any lotions/deodorants the morning of surgery.  Please wear clean clothes to the hospital/surgery center.  Please read over the fact sheets that you were given.

## 2016-10-20 ENCOUNTER — Encounter (HOSPITAL_COMMUNITY)
Admission: RE | Admit: 2016-10-20 | Discharge: 2016-10-20 | Disposition: A | Discharge status: Home / Self Care | Payer: 59 | Source: Ambulatory Visit | Attending: Orthopedic Surgery | Admitting: Orthopedic Surgery

## 2016-10-20 ENCOUNTER — Encounter (HOSPITAL_COMMUNITY): Payer: Self-pay

## 2016-10-20 DIAGNOSIS — Z01818 Encounter for other preprocedural examination: Secondary | ICD-10-CM | POA: Diagnosis present

## 2016-10-20 DIAGNOSIS — M1712 Unilateral primary osteoarthritis, left knee: Secondary | ICD-10-CM | POA: Insufficient documentation

## 2016-10-20 DIAGNOSIS — Z01812 Encounter for preprocedural laboratory examination: Secondary | ICD-10-CM | POA: Insufficient documentation

## 2016-10-20 HISTORY — DX: Unspecified osteoarthritis, unspecified site: M19.90

## 2016-10-20 HISTORY — DX: Major depressive disorder, single episode, unspecified: F32.9

## 2016-10-20 HISTORY — DX: Depression, unspecified: F32.A

## 2016-10-20 LAB — TYPE AND SCREEN
ABO/RH(D): A POS
Antibody Screen: NEGATIVE

## 2016-10-20 LAB — CBC
HCT: 39.6 % (ref 36.0–46.0)
Hemoglobin: 13.1 g/dL (ref 12.0–15.0)
MCH: 29.1 pg (ref 26.0–34.0)
MCHC: 33.1 g/dL (ref 30.0–36.0)
MCV: 88 fL (ref 78.0–100.0)
Platelets: 272 10*3/uL (ref 150–400)
RBC: 4.5 MIL/uL (ref 3.87–5.11)
RDW: 13.1 % (ref 11.5–15.5)
WBC: 5.3 10*3/uL (ref 4.0–10.5)

## 2016-10-20 LAB — ABO/RH: ABO/RH(D): A POS

## 2016-10-20 LAB — BASIC METABOLIC PANEL
Anion gap: 8 (ref 5–15)
BUN: 11 mg/dL (ref 6–20)
CO2: 26 mmol/L (ref 22–32)
Calcium: 9.9 mg/dL (ref 8.9–10.3)
Chloride: 107 mmol/L (ref 101–111)
Creatinine, Ser: 0.7 mg/dL (ref 0.44–1.00)
GFR calc Af Amer: >60 mL/min (ref >60)
GFR calc non Af Amer: >60 mL/min (ref >60)
Glucose, Bld: 87 mg/dL (ref 65–99)
Potassium: 4.1 mmol/L (ref 3.5–5.1)
Sodium: 141 mmol/L (ref 135–145)

## 2016-10-20 LAB — SURGICAL PCR SCREEN
MRSA, PCR: NEGATIVE
Staphylococcus aureus: NEGATIVE

## 2016-10-22 DIAGNOSIS — M1712 Unilateral primary osteoarthritis, left knee: Secondary | ICD-10-CM | POA: Diagnosis not present

## 2016-10-26 MED ORDER — CEFAZOLIN SODIUM-DEXTROSE 2-4 GM/100ML-% IV SOLN
2.0000 g | INTRAVENOUS | Status: AC
  Administered 2016-10-27: 2 g via INTRAVENOUS
  Filled 2016-10-26: qty 100

## 2016-10-26 MED ORDER — LACTATED RINGERS IV SOLN: INTRAVENOUS | Status: DC

## 2016-10-26 MED ORDER — TRANEXAMIC ACID 1000 MG/10ML IV SOLN
1000.0000 mg | INTRAVENOUS | Status: AC
  Administered 2016-10-27: 1000 mg via INTRAVENOUS
  Filled 2016-10-26: qty 10

## 2016-10-27 ENCOUNTER — Inpatient Hospital Stay (HOSPITAL_COMMUNITY): Payer: 59

## 2016-10-27 ENCOUNTER — Encounter (HOSPITAL_COMMUNITY): Payer: Self-pay | Admitting: Anesthesiology

## 2016-10-27 ENCOUNTER — Encounter (HOSPITAL_COMMUNITY)
Admission: RE | Disposition: A | Discharge status: Home Health | Payer: Self-pay | Source: Ambulatory Visit | Attending: Orthopedic Surgery

## 2016-10-27 ENCOUNTER — Inpatient Hospital Stay (HOSPITAL_COMMUNITY)
Admission: RE | Admit: 2016-10-27 | Discharge: 2016-10-28 | DRG: 470 | Disposition: A | Discharge status: Home Health | Payer: 59 | Source: Ambulatory Visit | Attending: Orthopedic Surgery | Admitting: Orthopedic Surgery

## 2016-10-27 ENCOUNTER — Inpatient Hospital Stay (HOSPITAL_COMMUNITY): Payer: 59 | Admitting: Anesthesiology

## 2016-10-27 DIAGNOSIS — M1712 Unilateral primary osteoarthritis, left knee: Secondary | ICD-10-CM | POA: Diagnosis present

## 2016-10-27 DIAGNOSIS — Z800 Family history of malignant neoplasm of digestive organs: Secondary | ICD-10-CM | POA: Diagnosis not present

## 2016-10-27 DIAGNOSIS — G8918 Other acute postprocedural pain: Secondary | ICD-10-CM | POA: Diagnosis not present

## 2016-10-27 DIAGNOSIS — Z86010 Personal history of colonic polyps: Secondary | ICD-10-CM

## 2016-10-27 DIAGNOSIS — K219 Gastro-esophageal reflux disease without esophagitis: Secondary | ICD-10-CM | POA: Diagnosis present

## 2016-10-27 DIAGNOSIS — E785 Hyperlipidemia, unspecified: Secondary | ICD-10-CM | POA: Diagnosis present

## 2016-10-27 DIAGNOSIS — D62 Acute posthemorrhagic anemia: Secondary | ICD-10-CM | POA: Diagnosis not present

## 2016-10-27 DIAGNOSIS — M25562 Pain in left knee: Secondary | ICD-10-CM | POA: Diagnosis present

## 2016-10-27 DIAGNOSIS — Z471 Aftercare following joint replacement surgery: Secondary | ICD-10-CM | POA: Diagnosis not present

## 2016-10-27 DIAGNOSIS — R112 Nausea with vomiting, unspecified: Secondary | ICD-10-CM | POA: Diagnosis not present

## 2016-10-27 DIAGNOSIS — Z87442 Personal history of urinary calculi: Secondary | ICD-10-CM

## 2016-10-27 DIAGNOSIS — Z96659 Presence of unspecified artificial knee joint: Secondary | ICD-10-CM

## 2016-10-27 DIAGNOSIS — Z96652 Presence of left artificial knee joint: Secondary | ICD-10-CM | POA: Diagnosis not present

## 2016-10-27 HISTORY — PX: TOTAL KNEE ARTHROPLASTY: SHX125

## 2016-10-27 SURGERY — ARTHROPLASTY, KNEE, TOTAL
Anesthesia: Spinal | Site: Knee | Laterality: Left

## 2016-10-27 MED ORDER — METHOCARBAMOL 1000 MG/10ML IJ SOLN
500.0000 mg | Freq: Four times a day (QID) | INTRAVENOUS | Status: DC | PRN
  Filled 2016-10-27: qty 5

## 2016-10-27 MED ORDER — ONDANSETRON HCL 4 MG/2ML IJ SOLN
4.0000 mg | Freq: Four times a day (QID) | INTRAMUSCULAR | Status: DC | PRN
  Administered 2016-10-27 (×2): 4 mg via INTRAVENOUS
  Filled 2016-10-27 (×2): qty 2

## 2016-10-27 MED ORDER — METOCLOPRAMIDE HCL 5 MG/ML IJ SOLN: 10.0000 mg | Freq: Once | INTRAMUSCULAR | Status: DC | PRN

## 2016-10-27 MED ORDER — TRANEXAMIC ACID 1000 MG/10ML IV SOLN
2000.0000 mg | INTRAVENOUS | Status: AC
  Administered 2016-10-27: 2000 mg via TOPICAL
  Filled 2016-10-27: qty 20

## 2016-10-27 MED ORDER — MIDAZOLAM HCL 2 MG/2ML IJ SOLN
INTRAMUSCULAR | Status: AC
  Filled 2016-10-27: qty 2

## 2016-10-27 MED ORDER — BUPIVACAINE LIPOSOME 1.3 % IJ SUSP
INTRAMUSCULAR | Status: DC | PRN
  Administered 2016-10-27: 20 mL

## 2016-10-27 MED ORDER — POLYETHYLENE GLYCOL 3350 17 G PO PACK: 17.0000 g | PACK | Freq: Every day | ORAL | Status: DC | PRN

## 2016-10-27 MED ORDER — METOCLOPRAMIDE HCL 5 MG/ML IJ SOLN
5.0000 mg | Freq: Three times a day (TID) | INTRAMUSCULAR | Status: DC | PRN
  Administered 2016-10-28: 10 mg via INTRAVENOUS
  Filled 2016-10-27: qty 2

## 2016-10-27 MED ORDER — OXYCODONE HCL 5 MG PO TABS
5.0000 mg | ORAL_TABLET | ORAL | Status: DC | PRN
  Administered 2016-10-27: 5 mg via ORAL
  Administered 2016-10-27 – 2016-10-28 (×4): 10 mg via ORAL
  Filled 2016-10-27 (×5): qty 2

## 2016-10-27 MED ORDER — OXYCODONE HCL 5 MG PO TABS
ORAL_TABLET | ORAL | Status: AC
  Filled 2016-10-27: qty 1

## 2016-10-27 MED ORDER — DEXAMETHASONE SODIUM PHOSPHATE 10 MG/ML IJ SOLN
10.0000 mg | Freq: Once | INTRAMUSCULAR | Status: AC
  Administered 2016-10-28: 10 mg via INTRAVENOUS
  Filled 2016-10-27: qty 1

## 2016-10-27 MED ORDER — FENTANYL CITRATE (PF) 100 MCG/2ML IJ SOLN: 25.0000 ug | INTRAMUSCULAR | Status: DC | PRN

## 2016-10-27 MED ORDER — DOCUSATE SODIUM 100 MG PO CAPS
100.0000 mg | ORAL_CAPSULE | Freq: Two times a day (BID) | ORAL | Status: DC
  Administered 2016-10-27 – 2016-10-28 (×3): 100 mg via ORAL
  Filled 2016-10-27 (×3): qty 1

## 2016-10-27 MED ORDER — BUPIVACAINE HCL 0.5 % IJ SOLN
INTRAMUSCULAR | Status: DC | PRN
  Administered 2016-10-27: 10 mL

## 2016-10-27 MED ORDER — METHOCARBAMOL 500 MG PO TABS
500.0000 mg | ORAL_TABLET | Freq: Four times a day (QID) | ORAL | Status: DC | PRN
  Administered 2016-10-27 – 2016-10-28 (×3): 500 mg via ORAL
  Filled 2016-10-27 (×4): qty 1

## 2016-10-27 MED ORDER — NORETHINDRONE-ETH ESTRADIOL 1-5 MG-MCG PO TABS: 1.0000 | ORAL_TABLET | Freq: Every day | ORAL | Status: DC

## 2016-10-27 MED ORDER — ACETAMINOPHEN 650 MG RE SUPP: 650.0000 mg | Freq: Four times a day (QID) | RECTAL | Status: DC | PRN

## 2016-10-27 MED ORDER — ACETAMINOPHEN 325 MG PO TABS: 650.0000 mg | ORAL_TABLET | Freq: Four times a day (QID) | ORAL | Status: DC | PRN

## 2016-10-27 MED ORDER — FENTANYL CITRATE (PF) 100 MCG/2ML IJ SOLN
INTRAMUSCULAR | Status: AC
  Filled 2016-10-27: qty 2

## 2016-10-27 MED ORDER — BUPIVACAINE IN DEXTROSE 0.75-8.25 % IT SOLN
INTRATHECAL | Status: DC | PRN
  Administered 2016-10-27: 1.5 mL via INTRATHECAL

## 2016-10-27 MED ORDER — ASPIRIN EC 325 MG PO TBEC
325.0000 mg | DELAYED_RELEASE_TABLET | Freq: Every day | ORAL | Status: DC
  Administered 2016-10-28: 325 mg via ORAL
  Filled 2016-10-27: qty 1

## 2016-10-27 MED ORDER — PHENOL 1.4 % MT LIQD: 1.0000 | OROMUCOSAL | Status: DC | PRN

## 2016-10-27 MED ORDER — CHLORHEXIDINE GLUCONATE 4 % EX LIQD: 60.0000 mL | Freq: Once | CUTANEOUS | Status: DC

## 2016-10-27 MED ORDER — PROPOFOL 10 MG/ML IV BOLUS
INTRAVENOUS | Status: AC
  Filled 2016-10-27: qty 20

## 2016-10-27 MED ORDER — SODIUM CHLORIDE 0.9 % IR SOLN
Status: DC | PRN
  Administered 2016-10-27: 1000 mL
  Administered 2016-10-27: 3000 mL

## 2016-10-27 MED ORDER — MENTHOL 3 MG MT LOZG
1.0000 | LOZENGE | OROMUCOSAL | Status: DC | PRN
Start: 2016-10-27 — End: 2016-10-28

## 2016-10-27 MED ORDER — MEPERIDINE HCL 25 MG/ML IJ SOLN: 6.2500 mg | INTRAMUSCULAR | Status: DC | PRN

## 2016-10-27 MED ORDER — FENTANYL CITRATE (PF) 100 MCG/2ML IJ SOLN
INTRAMUSCULAR | Status: AC
  Administered 2016-10-27: 100 ug
  Filled 2016-10-27: qty 2

## 2016-10-27 MED ORDER — ONDANSETRON HCL 4 MG PO TABS: 4.0000 mg | ORAL_TABLET | Freq: Four times a day (QID) | ORAL | Status: DC | PRN

## 2016-10-27 MED ORDER — ONDANSETRON HCL 4 MG/2ML IJ SOLN
INTRAMUSCULAR | Status: AC
  Filled 2016-10-27: qty 2

## 2016-10-27 MED ORDER — DIPHENHYDRAMINE HCL 12.5 MG/5ML PO ELIX
12.5000 mg | ORAL_SOLUTION | ORAL | Status: DC | PRN
Start: 2016-10-27 — End: 2016-10-28

## 2016-10-27 MED ORDER — BUPIVACAINE LIPOSOME 1.3 % IJ SUSP
20.0000 mL | INTRAMUSCULAR | Status: DC
  Filled 2016-10-27: qty 20

## 2016-10-27 MED ORDER — METHOCARBAMOL 500 MG PO TABS: 500.0000 mg | ORAL_TABLET | Freq: Four times a day (QID) | ORAL | 0 refills | Status: DC

## 2016-10-27 MED ORDER — BUPROPION HCL ER (XL) 150 MG PO TB24
300.0000 mg | ORAL_TABLET | Freq: Every day | ORAL | Status: DC
  Administered 2016-10-28: 300 mg via ORAL
  Filled 2016-10-27: qty 2

## 2016-10-27 MED ORDER — ONDANSETRON HCL 4 MG PO TABS: 4.0000 mg | ORAL_TABLET | Freq: Three times a day (TID) | ORAL | 0 refills | Status: DC | PRN

## 2016-10-27 MED ORDER — OXYCODONE-ACETAMINOPHEN 5-325 MG PO TABS: 1.0000 | ORAL_TABLET | ORAL | 0 refills | Status: DC | PRN

## 2016-10-27 MED ORDER — ASPIRIN EC 325 MG PO TBEC: 325.0000 mg | DELAYED_RELEASE_TABLET | Freq: Every day | ORAL | 0 refills | Status: DC

## 2016-10-27 MED ORDER — BISACODYL 10 MG RE SUPP: 10.0000 mg | Freq: Every day | RECTAL | Status: DC | PRN

## 2016-10-27 MED ORDER — MAGNESIUM CITRATE PO SOLN
1.0000 | Freq: Once | ORAL | Status: DC | PRN
Start: 2016-10-27 — End: 2016-10-28

## 2016-10-27 MED ORDER — METOCLOPRAMIDE HCL 5 MG PO TABS: 5.0000 mg | ORAL_TABLET | Freq: Three times a day (TID) | ORAL | Status: DC | PRN

## 2016-10-27 MED ORDER — ONDANSETRON HCL 4 MG/2ML IJ SOLN
INTRAMUSCULAR | Status: DC | PRN
  Administered 2016-10-27: 4 mg via INTRAVENOUS

## 2016-10-27 MED ORDER — HYDROMORPHONE HCL 2 MG/ML IJ SOLN
0.5000 mg | INTRAMUSCULAR | Status: DC | PRN
  Administered 2016-10-27 (×3): 1 mg via INTRAVENOUS
  Filled 2016-10-27 (×3): qty 1

## 2016-10-27 MED ORDER — ALUM & MAG HYDROXIDE-SIMETH 200-200-20 MG/5ML PO SUSP: 30.0000 mL | ORAL | Status: DC | PRN

## 2016-10-27 MED ORDER — CELECOXIB 200 MG PO CAPS
200.0000 mg | ORAL_CAPSULE | Freq: Two times a day (BID) | ORAL | Status: DC
  Administered 2016-10-27 – 2016-10-28 (×2): 200 mg via ORAL
  Filled 2016-10-27 (×2): qty 1

## 2016-10-27 MED ORDER — MIDAZOLAM HCL 2 MG/2ML IJ SOLN
INTRAMUSCULAR | Status: AC
  Administered 2016-10-27: 2 mg
  Filled 2016-10-27: qty 2

## 2016-10-27 MED ORDER — PROPOFOL 10 MG/ML IV BOLUS
INTRAVENOUS | Status: DC | PRN
  Administered 2016-10-27: 20 mg via INTRAVENOUS

## 2016-10-27 MED ORDER — LACTATED RINGERS IV SOLN: INTRAVENOUS | Status: DC

## 2016-10-27 MED ORDER — BUPIVACAINE-EPINEPHRINE (PF) 0.5% -1:200000 IJ SOLN
INTRAMUSCULAR | Status: DC | PRN
  Administered 2016-10-27: 30 mL via PERINEURAL

## 2016-10-27 MED ORDER — ZOLPIDEM TARTRATE 5 MG PO TABS: 5.0000 mg | ORAL_TABLET | Freq: Every evening | ORAL | Status: DC | PRN

## 2016-10-27 MED ORDER — BUPIVACAINE HCL (PF) 0.5 % IJ SOLN
INTRAMUSCULAR | Status: AC
  Filled 2016-10-27: qty 10

## 2016-10-27 MED ORDER — POTASSIUM CHLORIDE IN NACL 20-0.9 MEQ/L-% IV SOLN
INTRAVENOUS | Status: DC
  Administered 2016-10-27 (×2): via INTRAVENOUS
  Filled 2016-10-27 (×2): qty 1000

## 2016-10-27 MED ORDER — LACTATED RINGERS IV SOLN
INTRAVENOUS | Status: DC
  Administered 2016-10-27 (×2): via INTRAVENOUS

## 2016-10-27 MED ORDER — LIDOCAINE 2% (20 MG/ML) 5 ML SYRINGE
INTRAMUSCULAR | Status: DC | PRN
  Administered 2016-10-27: 20 mg via INTRAVENOUS

## 2016-10-27 MED ORDER — METHOCARBAMOL 500 MG PO TABS
ORAL_TABLET | ORAL | Status: AC
  Filled 2016-10-27: qty 1

## 2016-10-27 MED ORDER — PROPOFOL 500 MG/50ML IV EMUL
INTRAVENOUS | Status: DC | PRN
  Administered 2016-10-27: 75 ug/kg/min via INTRAVENOUS

## 2016-10-27 MED ORDER — CEFAZOLIN SODIUM-DEXTROSE 2-4 GM/100ML-% IV SOLN
2.0000 g | Freq: Four times a day (QID) | INTRAVENOUS | Status: AC
  Administered 2016-10-27 (×2): 2 g via INTRAVENOUS
  Filled 2016-10-27 (×2): qty 100

## 2016-10-27 SURGICAL SUPPLY — 64 items
APL SKNCLS STERI-STRIP NONHPOA (GAUZE/BANDAGES/DRESSINGS) ×1
BANDAGE ACE 4X5 VEL STRL LF (GAUZE/BANDAGES/DRESSINGS) ×2 IMPLANT
BANDAGE ACE 6X5 VEL STRL LF (GAUZE/BANDAGES/DRESSINGS) ×2 IMPLANT
BANDAGE ESMARK 6X9 LF (GAUZE/BANDAGES/DRESSINGS) ×1 IMPLANT
BENZOIN TINCTURE PRP APPL 2/3 (GAUZE/BANDAGES/DRESSINGS) ×2 IMPLANT
BLADE SAG 18X100X1.27 (BLADE) ×4 IMPLANT
BNDG CMPR 9X6 STRL LF SNTH (GAUZE/BANDAGES/DRESSINGS) ×1
BNDG ESMARK 6X9 LF (GAUZE/BANDAGES/DRESSINGS) ×2
BOWL SMART MIX CTS (DISPOSABLE) ×2 IMPLANT
CAPT KNEE TOTAL 3 ×1 IMPLANT
CEMENT BONE SIMPLEX SPEEDSET (Cement) ×4 IMPLANT
COVER SURGICAL LIGHT HANDLE (MISCELLANEOUS) ×2 IMPLANT
CUFF TOURNIQUET SINGLE 34IN LL (TOURNIQUET CUFF) ×2 IMPLANT
DRAPE HALF SHEET 40X57 (DRAPES) ×2 IMPLANT
DRAPE IMP U-DRAPE 54X76 (DRAPES) ×2 IMPLANT
DRAPE PROXIMA HALF (DRAPES) ×2 IMPLANT
DRAPE U-SHAPE 47X51 STRL (DRAPES) ×2 IMPLANT
DRSG AQUACEL AG ADV 3.5X10 (GAUZE/BANDAGES/DRESSINGS) ×2 IMPLANT
DURAPREP 26ML APPLICATOR (WOUND CARE) ×4 IMPLANT
ELECT CAUTERY BLADE 6.4 (BLADE) ×2 IMPLANT
ELECT REM PT RETURN 9FT ADLT (ELECTROSURGICAL) ×2
ELECTRODE REM PT RTRN 9FT ADLT (ELECTROSURGICAL) ×1 IMPLANT
EVACUATOR 1/8 PVC DRAIN (DRAIN) IMPLANT
FACESHIELD WRAPAROUND (MASK) ×4 IMPLANT
FACESHIELD WRAPAROUND OR TEAM (MASK) ×2 IMPLANT
GLOVE BIOGEL PI IND STRL 7.0 (GLOVE) ×1 IMPLANT
GLOVE BIOGEL PI INDICATOR 7.0 (GLOVE) ×1
GLOVE ORTHO TXT STRL SZ7.5 (GLOVE) ×2 IMPLANT
GLOVE SURG ORTHO 7.0 STRL STRW (GLOVE) ×2 IMPLANT
GOWN STRL REUS W/ TWL LRG LVL3 (GOWN DISPOSABLE) ×2 IMPLANT
GOWN STRL REUS W/ TWL XL LVL3 (GOWN DISPOSABLE) ×1 IMPLANT
GOWN STRL REUS W/TWL LRG LVL3 (GOWN DISPOSABLE) ×6
GOWN STRL REUS W/TWL XL LVL3 (GOWN DISPOSABLE) ×2
HANDPIECE INTERPULSE COAX TIP (DISPOSABLE) ×2
IMMOBILIZER KNEE 22 UNIV (SOFTGOODS) ×2 IMPLANT
IMMOBILIZER KNEE 24 THIGH 36 (MISCELLANEOUS) IMPLANT
IMMOBILIZER KNEE 24 UNIV (MISCELLANEOUS)
KIT BASIN OR (CUSTOM PROCEDURE TRAY) ×2 IMPLANT
KIT ROOM TURNOVER OR (KITS) ×2 IMPLANT
MANIFOLD NEPTUNE II (INSTRUMENTS) ×2 IMPLANT
NDL 18GX1X1/2 (RX/OR ONLY) (NEEDLE) ×1 IMPLANT
NDL HYPO 25GX1X1/2 BEV (NEEDLE) ×1 IMPLANT
NEEDLE 18GX1X1/2 (RX/OR ONLY) (NEEDLE) ×2 IMPLANT
NEEDLE HYPO 25GX1X1/2 BEV (NEEDLE) ×2 IMPLANT
NS IRRIG 1000ML POUR BTL (IV SOLUTION) ×2 IMPLANT
PACK TOTAL JOINT (CUSTOM PROCEDURE TRAY) ×2 IMPLANT
PACK UNIVERSAL I (CUSTOM PROCEDURE TRAY) ×1 IMPLANT
PAD ARMBOARD 7.5X6 YLW CONV (MISCELLANEOUS) ×4 IMPLANT
SET HNDPC FAN SPRY TIP SCT (DISPOSABLE) ×1 IMPLANT
STRIP CLOSURE SKIN 1/2X4 (GAUZE/BANDAGES/DRESSINGS) ×3 IMPLANT
SUCTION FRAZIER HANDLE 10FR (MISCELLANEOUS) ×1
SUCTION TUBE FRAZIER 10FR DISP (MISCELLANEOUS) ×1 IMPLANT
SUT MNCRL AB 4-0 PS2 18 (SUTURE) ×2 IMPLANT
SUT VIC AB 0 CT1 27 (SUTURE) ×2
SUT VIC AB 0 CT1 27XBRD ANBCTR (SUTURE) IMPLANT
SUT VIC AB 1 CTX 36 (SUTURE) ×4
SUT VIC AB 1 CTX36XBRD ANBCTR (SUTURE) ×1 IMPLANT
SUT VIC AB 2-0 CT1 27 (SUTURE) ×4
SUT VIC AB 2-0 CT1 TAPERPNT 27 (SUTURE) ×2 IMPLANT
SYR 50ML LL SCALE MARK (SYRINGE) ×2 IMPLANT
SYR CONTROL 10ML LL (SYRINGE) ×2 IMPLANT
TOWEL OR 17X24 6PK STRL BLUE (TOWEL DISPOSABLE) ×2 IMPLANT
TOWEL OR 17X26 10 PK STRL BLUE (TOWEL DISPOSABLE) ×2 IMPLANT
TRAY CATH 16FR W/PLASTIC CATH (SET/KITS/TRAYS/PACK) ×1 IMPLANT

## 2016-10-27 NOTE — Transfer of Care (Signed)
Immediate Anesthesia Transfer of Care Note  Patient: Dana Williams  Procedure(s) Performed: Procedure(s): TOTAL KNEE ARTHROPLASTY (Left)  Patient Location: PACU  Anesthesia Type:Spinal  Level of Consciousness: awake, alert  and oriented  Airway & Oxygen Therapy: Patient Spontanous Breathing and Patient connected to nasal cannula oxygen  Post-op Assessment: Report given to RN, Post -op Vital signs reviewed and stable and Patient moving all extremities  Post vital signs: Reviewed and stable  Last Vitals:  Vitals:   10/27/16 1007 10/27/16 1230  BP: 136/90 122/86  Pulse: 97 95  Resp: 14 17  Temp:  36.4 C    Last Pain:  Vitals:   10/27/16 0858  TempSrc: Oral         Complications: No apparent anesthesia complications

## 2016-10-27 NOTE — Anesthesia Procedure Notes (Addendum)
Procedure Name: MAC Date/Time: 10/27/2016 10:58 AM Performed by: Kyung Rudd Pre-anesthesia Checklist: Patient identified, Emergency Drugs available, Suction available and Patient being monitored Patient Re-evaluated:Patient Re-evaluated prior to inductionOxygen Delivery Method: Simple face mask Placement Confirmation: positive ETCO2

## 2016-10-27 NOTE — Anesthesia Preprocedure Evaluation (Signed)
Anesthesia Evaluation  Patient identified by MRN, date of birth, ID band Patient awake    Reviewed: Allergy & Precautions, NPO status , Patient's Chart, lab work & pertinent test results  History of Anesthesia Complications (+) PONV  Airway Mallampati: II  TM Distance: >3 FB Neck ROM: Full    Dental no notable dental hx.    Pulmonary neg pulmonary ROS,    Pulmonary exam normal breath sounds clear to auscultation       Cardiovascular negative cardio ROS Normal cardiovascular exam Rhythm:Regular Rate:Normal     Neuro/Psych negative neurological ROS  negative psych ROS   GI/Hepatic Neg liver ROS, GERD  Medicated and Controlled,  Endo/Other  negative endocrine ROS  Renal/GU negative Renal ROS  negative genitourinary   Musculoskeletal  (+) Arthritis ,   Abdominal   Peds negative pediatric ROS (+)  Hematology negative hematology ROS (+)   Anesthesia Other Findings   Reproductive/Obstetrics negative OB ROS                             Anesthesia Physical Anesthesia Plan  ASA: II  Anesthesia Plan: Spinal   Post-op Pain Management:  Regional for Post-op pain   Induction:   Airway Management Planned: Simple Face Mask  Additional Equipment:   Intra-op Plan:   Post-operative Plan:   Informed Consent: I have reviewed the patients History and Physical, chart, labs and discussed the procedure including the risks, benefits and alternatives for the proposed anesthesia with the patient or authorized representative who has indicated his/her understanding and acceptance.   Dental advisory given  Plan Discussed with: CRNA  Anesthesia Plan Comments: (SAB and adductor canal block)        Anesthesia Quick Evaluation

## 2016-10-27 NOTE — Anesthesia Postprocedure Evaluation (Signed)
Anesthesia Post Note  Patient: Dana Williams  Procedure(s) Performed: Procedure(s) (LRB): TOTAL KNEE ARTHROPLASTY (Left)  Patient location during evaluation: PACU Anesthesia Type: Spinal and Regional Level of consciousness: awake and alert Pain management: pain level controlled Vital Signs Assessment: post-procedure vital signs reviewed and stable Respiratory status: spontaneous breathing and respiratory function stable Cardiovascular status: blood pressure returned to baseline and stable Postop Assessment: no headache, no backache and spinal receding Anesthetic complications: no    Last Vitals:  Vitals:   10/27/16 1400 10/27/16 1424  BP: (!) 134/93   Pulse: 98 75  Resp: 15 16  Temp:  36.1 C    Last Pain:  Vitals:   10/27/16 1424  TempSrc:   PainSc: 7                  Montez Hageman

## 2016-10-27 NOTE — Progress Notes (Signed)
Orthopedic Tech Progress Note Patient Details:  Dana Williams 1961/09/21 [id number]  CPM Left Knee CPM Left Knee: On Left Knee Flexion (Degrees): 90 Left Knee Extension (Degrees): 0 Additional Comments: trapeze bar patient helper   Hildred Priest 10/27/2016, 1:17 PM Viewed order from doctor's order list

## 2016-10-27 NOTE — Evaluation (Signed)
Physical Therapy Evaluation Patient Details Name: Dana Williams MRN: [id number] DOB: [birth year] Today's Date: 10/27/2016   History of Present Illness  Ptis a 55 y.o. female now s/p Lt TKA. PMH: psoriatic arthritis.   Clinical Impression  Pt is s/p Lt TKA resulting in the deficits listed below (see PT Problem List). Pt mobilizing very well during initial PT evaluation. Based upon the patient's current mobility level, anticipate D/c to home with family support.  Pt will benefit from skilled PT to increase their independence and safety with mobility.     Follow Up Recommendations Home health PT;Supervision for mobility/OOB    Equipment Recommendations  Rolling walker with 5" wheels    Recommendations for Other Services       Precautions / Restrictions Precautions Precautions: Knee;Fall Precaution Booklet Issued: Yes (comment) Precaution Comments: HEP provided, reviewed knee extension precautions Restrictions Weight Bearing Restrictions: Yes LLE Weight Bearing: Weight bearing as tolerated      Mobility  Bed Mobility Overal bed mobility: Needs Assistance Bed Mobility: Supine to Sit     Supine to sit: Supervision     General bed mobility comments: HOB elevated using rail to assist  Transfers Overall transfer level: Needs assistance Equipment used: Rolling walker (2 wheeled) Transfers: Sit to/from Stand Sit to Stand: Min guard         General transfer comment: performed from bed and BSC  Ambulation/Gait Ambulation/Gait assistance: Min guard Ambulation Distance (Feet):  (15" X1, 10' X1) Assistive device: Rolling walker (2 wheeled) Gait Pattern/deviations: Step-through pattern;Decreased step length - left Gait velocity: decreased   General Gait Details: doing well with weightbearing i  Stairs            Wheelchair Mobility    Modified Rankin (Stroke Patients Only)       Balance Overall balance assessment: Needs assistance Sitting-balance support: No  upper extremity supported Sitting balance-Leahy Scale: Good     Standing balance support: Bilateral upper extremity supported Standing balance-Leahy Scale: Poor Standing balance comment: using rw                             Pertinent Vitals/Pain Pain Assessment: 0-10 Pain Score: 6  Pain Location: Lt knee Pain Descriptors / Indicators: Aching Pain Intervention(s): Limited activity within patient's tolerance;Monitored during session    Home Living Family/patient expects to be discharged to:: Private residence Living Arrangements: Alone Available Help at Discharge: Family;Available 24 hours/day Type of Home: Apartment Home Access: Stairs to enter Entrance Stairs-Rails: Left Entrance Stairs-Number of Steps: 17 (descends to enter) Home Layout: One level Home Equipment: None Additional Comments: daughter will be staying with her initially.    Prior Function Level of Independence: Independent         Comments: works as a Nutritional therapist        Extremity/Trunk Assessment   Upper Extremity Assessment Upper Extremity Assessment: Overall WFL for tasks assessed    Lower Extremity Assessment Lower Extremity Assessment: LLE deficits/detail LLE Deficits / Details: able to perform SLR with lag       Communication   Communication: No difficulties  Cognition Arousal/Alertness: Awake/alert Behavior During Therapy: WFL for tasks assessed/performed Overall Cognitive Status: Within Functional Limits for tasks assessed                      General Comments      Exercises     Assessment/Plan    PT  Assessment Patient needs continued PT services  PT Problem List Decreased strength;Decreased range of motion;Decreased activity tolerance;Decreased balance;Decreased mobility          PT Treatment Interventions DME instruction;Gait training;Stair training;Functional mobility training;Therapeutic activities;Therapeutic exercise;Patient/family  education    PT Goals (Current goals can be found in the Care Plan section)  Acute Rehab PT Goals Patient Stated Goal: be independent without pain PT Goal Formulation: With patient Time For Goal Achievement: 11/10/16 Potential to Achieve Goals: Good    Frequency 7X/week   Barriers to discharge        Co-evaluation               End of Session Equipment Utilized During Treatment: Gait belt Activity Tolerance: Patient tolerated treatment well (nausea with emesis X2, nursing aware and medicated) Patient left: in chair;with call bell/phone within reach (in bone foam) Nurse Communication: Mobility status;Weight bearing status         Time: [id number] PT Time Calculation (min) (ACUTE ONLY): 36 min   Charges:   PT Evaluation $PT Eval Moderate Complexity: 1 Procedure PT Treatments $Gait Training: 8-22 mins   PT G Codes:        Cassell Clement, PT, CSCS Pager [phone] Office [phone]  10/27/2016, 4:24 PM

## 2016-10-27 NOTE — Discharge Summary (Addendum)
Patient ID: Dana Williams MRN: [id number] DOB/AGE: [birth year] 55 y.o.  Admit date: 10/27/2016 Discharge date: 10/28/2016  Admission Diagnoses:  Active Problems:   Primary localized osteoarthritis of left knee   Discharge Diagnoses:  Same  Past Medical History:  Diagnosis Date  . Arthritis   . Depression   . GERD (gastroesophageal reflux disease)   . H/O psoriatic arthritis    DX  age 64---- in remission for 20 years  . History of hiatal hernia   . History of kidney stones   . Hyperlipidemia   . Personal history of colonic polyp - adenoma 01/29/2014  . Plantar fasciitis of left foot   . PONV (postoperative nausea and vomiting)   . Renal calculus, left   . Sigmoid diverticulosis   . Vitamin D deficiency     Surgeries: Procedure(s): TOTAL KNEE ARTHROPLASTY on 10/27/2016   Consultants:   Discharged Condition: Improved  Hospital Course: Dana Williams is an 55 y.o. female who was admitted 10/27/2016 for operative treatment of primary localized osteoarthritis left knee. Patient has severe unremitting pain that affects sleep, daily activities, and work/hobbies. After pre-op clearance the patient was taken to the operating room on 10/27/2016 and underwent  Procedure(s): TOTAL KNEE ARTHROPLASTY.    Patient was given perioperative antibiotics:  Anti-infectives    Start     Dose/Rate Route Frequency Ordered Stop   10/27/16 1630  ceFAZolin (ANCEF) IVPB 2g/100 mL premix     2 g 200 mL/hr over 30 Minutes Intravenous Every 6 hours 10/27/16 1443 10/27/16 2356   10/27/16 1030  ceFAZolin (ANCEF) IVPB 2g/100 mL premix     2 g 200 mL/hr over 30 Minutes Intravenous To ShortStay Surgical 10/26/16 1059 10/27/16 1126       Patient was given sequential compression devices, early ambulation, and chemoprophylaxis to prevent DVT.  Patient benefited maximally from hospital stay and there were no complications.    Recent vital signs:  Patient Vitals for the past 24 hrs:  BP Temp Temp src  Pulse Resp SpO2 Height Weight  10/28/16 0440 (!) 144/81 99 F (37.2 C) Oral 95 18 97 % - -  10/28/16 0033 133/90 99.5 F (37.5 C) Oral (!) 105 16 100 % - -  10/27/16 2034 (!) 141/91 98.9 F (37.2 C) Oral (!) 105 18 95 % - -  10/27/16 1446 130/81 98.6 F (37 C) Oral 76 18 100 % - -  10/27/16 1424 - 97 F (36.1 C) - 75 16 96 % - -  10/27/16 1400 (!) 134/93 - - 98 15 98 % - -  10/27/16 1330 130/88 - - 89 17 97 % - -  10/27/16 1300 136/88 - - 89 18 97 % - -  10/27/16 1245 132/89 - - 91 13 100 % - -  10/27/16 1230 122/86 97.5 F (36.4 C) - 95 17 100 % - -  10/27/16 1007 136/90 - - 97 14 98 % - -  10/27/16 0858 132/88 - Oral (!) 102 18 98 % 5' 5.5" (1.664 m) 95.5 kg (210 lb 8.6 oz)     Recent laboratory studies:   Recent Labs  10/28/16 0530  WBC 7.2  HGB 11.4*  HCT 35.3*  PLT 213  NA 139  K 4.1  CL 104  CO2 28  BUN 6  CREATININE 0.72  GLUCOSE 129*  CALCIUM 8.5*     Discharge Medications:     Medication List    TAKE these medications   ammonium lactate 12 %  lotion Commonly known as:  LAC-HYDRIN Apply 1 application topically daily.   aspirin EC 325 MG tablet Take 1 tablet (325 mg total) by mouth daily. 1 tab a day for the next 30 days to prevent blood clots   buPROPion 300 MG 24 hr tablet Commonly known as:  WELLBUTRIN XL Take 300 mg by mouth daily.   calcipotriene-betamethasone ointment Commonly known as:  TACLONEX Apply 1 application topically daily.   TACLONEX external suspension Generic drug:  calcipotriene-betamethasone Apply 1 application topically daily as needed.   celecoxib 200 MG capsule Commonly known as:  CELEBREX Take 200 mg by mouth daily.   cephALEXin 500 MG capsule Commonly known as:  KEFLEX Take 1 capsule (500 mg total) by mouth 2 (two) times daily.   esomeprazole 40 MG capsule Commonly known as:  NEXIUM Take 40 mg by mouth daily before breakfast.   FINACEA 15 % cream Generic drug:  Azelaic Acid Apply 1 application topically  daily.   methocarbamol 500 MG tablet Commonly known as:  ROBAXIN Take 1 tablet (500 mg total) by mouth 4 (four) times daily.   MULTIVITAMIN/EXTRA VITAMIN D3 Chew Chew 2 tablets by mouth daily.   norethindrone-ethinyl estradiol 1-5 MG-MCG Tabs tablet Commonly known as:  FEMHRT 1/5 Take 1 tablet by mouth daily.   ondansetron 4 MG tablet Commonly known as:  ZOFRAN Take 1 tablet (4 mg total) by mouth every 8 (eight) hours as needed for nausea or vomiting.   oxyCODONE-acetaminophen 5-325 MG tablet Commonly known as:  PERCOCET Take 1-2 tablets by mouth every 4 (four) hours as needed for severe pain.   URIBEL 118 MG Caps Take 1 capsule (118 mg total) by mouth every 8 (eight) hours as needed.   VITAMIN B12 PO Take 2 tablets by mouth daily. Vitamin B-12 Chews       Diagnostic Studies: Dg Knee Left Port  Result Date: 10/27/2016 CLINICAL DATA:  Postop left knee replacement. EXAM: PORTABLE LEFT KNEE - 1-2 VIEW COMPARISON:  None. FINDINGS: The left knee demonstrates a total knee arthroplasty without evidence of hardware failure complication. There is no significant joint effusion. There is no fracture or dislocation. The alignment is anatomic. Post-surgical changes noted in the surrounding soft tissues. IMPRESSION: Left total knee arthroplasty. Electronically Signed   By: Kathreen Devoid   On: 10/27/2016 14:45    Disposition: 01-Home or Self Care    Follow-up Information    [name], MD. Schedule an appointment as soon as possible for a visit in 2 weeks.   Specialty:  Orthopedic Surgery Contact information: [address] [place] [address] [phone]            Signed: Fannie Knee 10/28/2016, 7:05 AM

## 2016-10-27 NOTE — Discharge Instructions (Signed)

## 2016-10-27 NOTE — Interval H&P Note (Signed)
History and Physical Interval Note:  10/27/2016 10:03 AM  Dana Williams  has presented today for surgery, with the diagnosis of OA LEFT KNEE  The various methods of treatment have been discussed with the patient and family. After consideration of risks, benefits and other options for treatment, the patient has consented to  Procedure(s): TOTAL KNEE ARTHROPLASTY (Left) as a surgical intervention .  The patient's history has been reviewed, patient examined, no change in status, stable for surgery.  I have reviewed the patient's chart and labs.  Questions were answered to the patient's satisfaction.     [name]

## 2016-10-27 NOTE — Anesthesia Procedure Notes (Addendum)
Anesthesia Regional Block:  Adductor canal block  Pre-Anesthetic Checklist: ,, timeout performed, Correct Patient, Correct Site, Correct Laterality, Correct Procedure, Correct Position, site marked, Risks and benefits discussed,  Surgical consent,  Pre-op evaluation,  At surgeon's request and post-op pain management  Laterality: Left and Lower  Prep: Maximum Sterile Barrier Precautions used, chloraprep       Needles:  Injection technique: Single-shot  Needle Type: Echogenic Stimulator Needle     Needle Length: 10cm 10 cm Needle Gauge: 21 G    Additional Needles:  Procedures: ultrasound guided (picture in chart) Adductor canal block Narrative:  Start time: 10/27/2016 10:00 AM End time: 10/27/2016 10:09 AM Injection made incrementally with aspirations every 5 mL.  Performed by: Personally  Anesthesiologist: Montez Hageman  Additional Notes: Risks, benefits and alternative to block explained extensively.  Patient tolerated procedure well, without complications.

## 2016-10-27 NOTE — Anesthesia Procedure Notes (Addendum)
Spinal  Patient location during procedure: OR Staffing Anesthesiologist: CARIGNAN, PETER Performed: anesthesiologist  Preanesthetic Checklist Completed: patient identified, site marked, surgical consent, pre-op evaluation, timeout performed, IV checked, risks and benefits discussed and monitors and equipment checked Spinal Block Patient position: sitting Prep: ChloraPrep Patient monitoring: heart rate, continuous pulse ox and blood pressure Approach: right paramedian Location: L4-5 Injection technique: single-shot Needle Needle type: Sprotte  Needle gauge: 24 G Needle length: 9 cm Additional Notes Expiration date of kit checked and confirmed. Patient tolerated procedure well, without complications.       

## 2016-10-28 ENCOUNTER — Encounter (HOSPITAL_COMMUNITY): Payer: Self-pay | Admitting: Orthopedic Surgery

## 2016-10-28 LAB — BASIC METABOLIC PANEL
Anion gap: 7 (ref 5–15)
BUN: 6 mg/dL (ref 6–20)
CO2: 28 mmol/L (ref 22–32)
Calcium: 8.5 mg/dL — ABNORMAL LOW (ref 8.9–10.3)
Chloride: 104 mmol/L (ref 101–111)
Creatinine, Ser: 0.72 mg/dL (ref 0.44–1.00)
GFR calc Af Amer: >60 mL/min (ref >60)
GFR calc non Af Amer: >60 mL/min (ref >60)
Glucose, Bld: 129 mg/dL — ABNORMAL HIGH (ref 65–99)
Potassium: 4.1 mmol/L (ref 3.5–5.1)
Sodium: 139 mmol/L (ref 135–145)

## 2016-10-28 LAB — CBC
HCT: 35.3 % — ABNORMAL LOW (ref 36.0–46.0)
Hemoglobin: 11.4 g/dL — ABNORMAL LOW (ref 12.0–15.0)
MCH: 29.5 pg (ref 26.0–34.0)
MCHC: 32.3 g/dL (ref 30.0–36.0)
MCV: 91.5 fL (ref 78.0–100.0)
Platelets: 213 10*3/uL (ref 150–400)
RBC: 3.86 MIL/uL — ABNORMAL LOW (ref 3.87–5.11)
RDW: 13.3 % (ref 11.5–15.5)
WBC: 7.2 10*3/uL (ref 4.0–10.5)

## 2016-10-28 NOTE — Evaluation (Signed)
Occupational Therapy Evaluation/Discharge Patient Details Name: Dana Williams MRN: [id number] DOB: 05-19-1961 Today's Date: 10/28/2016    History of Present Illness Ptis a 55 y.o. female now s/p Lt TKA. PMH: psoriatic arthritis.    Clinical Impression   PTA, pt was independent in ADL and functional mobility and works as a Marine scientist. Pt currently requires min guard assist for shower transfers, supervision for standing grooming tasks, and min assist for LB ADL. Pt will have 24 hour assistance while recovering as her daughter is planning to stay with her. Completed all education concerning safety with ADL post-operatively, safe use of DME, dressing techniques, fall prevention, energy conservation, and use of ice for pain management. Pt has no further acute OT needs and no OT follow-up recommended. Recommend 3-in-1 BSC for DME needs. OT will sign off.    Follow Up Recommendations  No OT follow up;Supervision/Assistance - 24 hour    Equipment Recommendations  3 in 1 bedside commode    Recommendations for Other Services       Precautions / Restrictions Precautions Precautions: Knee;Fall Precaution Booklet Issued: No Precaution Comments: reviewed knee precautions Restrictions Weight Bearing Restrictions: Yes LLE Weight Bearing: Weight bearing as tolerated      Mobility Bed Mobility               General bed mobility comments: Received in recliner  Transfers Overall transfer level: Needs assistance Equipment used: Rolling walker (2 wheeled) Transfers: Sit to/from Stand Sit to Stand: Supervision              Balance Overall balance assessment: Needs assistance Sitting-balance support: No upper extremity supported;Feet supported Sitting balance-Leahy Scale: Good     Standing balance support: Bilateral upper extremity supported;No upper extremity supported;During functional activity Standing balance-Leahy Scale: Fair Standing balance comment: Able to stand statically  for grooming tasks at sink without UE support but requires RW for support for functional ambulation.                            ADL Overall ADL's : Needs assistance/impaired     Grooming: Oral care;Supervision/safety;Standing   Upper Body Bathing: Set up;Sitting   Lower Body Bathing: Minimal assistance;Sit to/from stand   Upper Body Dressing : Set up;Sitting   Lower Body Dressing: Minimal assistance;Sit to/from stand   Toilet Transfer: Supervision/safety;Ambulation;RW;BSC Toilet Transfer Details (indicate cue type and reason): Close supervision for safety. Toileting- Clothing Manipulation and Hygiene: Supervision/safety;Sit to/from stand   Tub/ Shower Transfer: Walk-in shower;Min guard;Cueing for safety;Cueing for sequencing;3 in 1;Ambulation;Rolling walker   Functional mobility during ADLs: Supervision/safety;Min guard;Rolling walker General ADL Comments: Educated pt on safe shower transfers, LB dressing techniques, fall prevention, and energy conservation.     Vision Vision Assessment?: No apparent visual deficits   Perception     Praxis      Pertinent Vitals/Pain Pain Assessment: Faces Faces Pain Scale: Hurts even more Pain Location: Lt knee Pain Descriptors / Indicators: Aching Pain Intervention(s): Limited activity within patient's tolerance;Monitored during session;Premedicated before session;Repositioned;Ice applied     Hand Dominance Right   Extremity/Trunk Assessment Upper Extremity Assessment Upper Extremity Assessment: Overall WFL for tasks assessed   Lower Extremity Assessment Lower Extremity Assessment: Defer to PT evaluation       Communication Communication Communication: No difficulties   Cognition Arousal/Alertness: Awake/alert Behavior During Therapy: WFL for tasks assessed/performed Overall Cognitive Status: Within Functional Limits for tasks assessed  General Comments       Exercises        Shoulder Instructions      Home Living Family/patient expects to be discharged to:: Private residence Living Arrangements: Alone Available Help at Discharge: Family;Available 24 hours/day Type of Home: Apartment Home Access: Stairs to enter Entrance Stairs-Number of Steps: 17 (descends to enter) Entrance Stairs-Rails: Left Home Layout: One level     Bathroom Shower/Tub: Occupational psychologist: Standard Bathroom Accessibility: Yes How Accessible: Accessible via walker Home Equipment: None   Additional Comments: daughter will be staying with her initially.      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 Problem List: Decreased strength;Decreased range of motion;Decreased activity tolerance;Impaired balance (sitting and/or standing);Decreased safety awareness;Decreased knowledge of use of DME or AE;Decreased knowledge of precautions;Pain   OT Treatment/Interventions:      OT Goals(Current goals can be found in the care plan section) Acute Rehab OT Goals Patient Stated Goal: be independent without pain OT Goal Formulation: With patient Time For Goal Achievement: 11/11/16 Potential to Achieve Goals: Good  OT Frequency:     Barriers to D/C:            Co-evaluation              End of Session Equipment Utilized During Treatment: Gait belt;Rolling walker  Activity Tolerance: Patient tolerated treatment well Patient left: in chair;with call bell/phone within reach   Time: 0900-0923 OT Time Calculation (min): 23 min Charges:  OT General Charges $OT Visit: 1 Procedure OT Evaluation $OT Eval Moderate Complexity: 1 Procedure OT Treatments $Self Care/Home Management : 8-22 mins G-Codes:    Charity A Byrum November 25, 2016, 10:53 AM

## 2016-10-28 NOTE — Progress Notes (Signed)
Physical Therapy Treatment Patient Details Name: Dana Williams MRN: [id number] DOB: 1961-02-06 Today's Date: 10/28/2016    History of Present Illness Ptis a 55 y.o. female now s/p Lt TKA. PMH: psoriatic arthritis.     PT Comments    Pt is POD 1 and moving well with therapy. Performed supine exercises and joint measurements were taken and noted below. Pt is able to perform gait with good cadence and performed stair negotiation with good quality and sequencing. Pt is ready to DC home after second session this afternoon and will have family at home with her when she returns. RN notified.   Follow Up Recommendations  Home health PT;Supervision for mobility/OOB     Equipment Recommendations  Rolling walker with 5" wheels    Recommendations for Other Services       Precautions / Restrictions Precautions Precautions: Knee;Fall Precaution Booklet Issued: No Precaution Comments: reviewed knee precautions Restrictions Weight Bearing Restrictions: Yes LLE Weight Bearing: Weight bearing as tolerated    Mobility  Bed Mobility               General bed mobility comments: Received in recliner  Transfers Overall transfer level: Needs assistance Equipment used: Rolling walker (2 wheeled) Transfers: Sit to/from Stand Sit to Stand: Supervision         General transfer comment: Supervision for safety  Ambulation/Gait Ambulation/Gait assistance: Min guard Ambulation Distance (Feet): 250 Feet Assistive device: Rolling walker (2 wheeled) Gait Pattern/deviations: Step-through pattern;Decreased step length - left Gait velocity: decreased Gait velocity interpretation: Below normal speed for age/gender General Gait Details: doing well with weightbearing and cadence   Stairs Stairs: Yes   Stair Management: One rail Right;Step to pattern;Forwards Number of Stairs: 7 General stair comments: Supervision for safety  Wheelchair Mobility    Modified Rankin (Stroke Patients  Only)       Balance Overall balance assessment: Needs assistance Sitting-balance support: No upper extremity supported;Feet supported Sitting balance-Leahy Scale: Good     Standing balance support: No upper extremity supported Standing balance-Leahy Scale: Fair Standing balance comment: able to stand statically briefly to don gait belt                    Cognition Arousal/Alertness: Awake/alert Behavior During Therapy: WFL for tasks assessed/performed Overall Cognitive Status: Within Functional Limits for tasks assessed                      Exercises Total Joint Exercises Ankle Circles/Pumps: AROM;Both;20 reps;Supine Quad Sets: AROM;Left;10 reps;Supine Heel Slides: AROM;Left;10 reps;Supine Goniometric ROM: 0-75    General Comments        Pertinent Vitals/Pain Pain Assessment: 0-10 Pain Score: 5  Faces Pain Scale: Hurts even more Pain Location: Lt knee Pain Descriptors / Indicators: Aching Pain Intervention(s): Monitored during session;Premedicated before session;Ice applied    Home Living Family/patient expects to be discharged to:: Private residence Living Arrangements: Alone Available Help at Discharge: Family;Available 24 hours/day Type of Home: Apartment Home Access: Stairs to enter Entrance Stairs-Rails: Left Home Layout: One level Home Equipment: Shower seat Additional Comments: daughter will be staying with her initially.    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 be found in the care plan section) Acute Rehab PT Goals Patient Stated Goal: be independent without pain Progress towards PT goals: Progressing toward goals    Frequency    7X/week  PT Plan Current plan remains appropriate    Co-evaluation             End of Session Equipment Utilized During Treatment: Gait belt Activity Tolerance: Patient tolerated treatment well Patient left:  in bed;in CPM;with call bell/phone within reach;with SCD's reapplied     Time: 1005-1045 PT Time Calculation (min) (ACUTE ONLY): 40 min  Charges:  $Gait Training: 8-22 mins $Therapeutic Exercise: 8-22 mins $Therapeutic Activity: 8-22 mins                    G Codes:      Scheryl Marten PT, DPT  [phone]  10/28/2016, 12:46 PM

## 2016-10-28 NOTE — Progress Notes (Signed)
Physical Therapy Treatment Patient Details Name: Dana Williams MRN: [id number] DOB: 04-13-1961 Today's Date: 10/28/2016    History of Present Illness Ptis a 55 y.o. female now s/p Lt TKA. PMH: psoriatic arthritis.     PT Comments    Pt continues to be moving well with therapy. Performed stair negotiation training and gait training with no increased c/o discomfort. Pt is able to perform SLR without any assistance and has good quad control in LLE.    Follow Up Recommendations  Home health PT;Supervision for mobility/OOB     Equipment Recommendations  Rolling walker with 5" wheels    Recommendations for Other Services       Precautions / Restrictions Precautions Precautions: Knee;Fall Precaution Booklet Issued: Yes (comment) Precaution Comments: reviewed knee precautions Restrictions Weight Bearing Restrictions: Yes LLE Weight Bearing: Weight bearing as tolerated    Mobility  Bed Mobility Overal bed mobility: Needs Assistance Bed Mobility: Supine to Sit     Supine to sit: Supervision     General bed mobility comments: Supervision for safety  Transfers Overall transfer level: Needs assistance Equipment used: Rolling walker (2 wheeled) Transfers: Sit to/from Stand Sit to Stand: Supervision         General transfer comment: Supervision for safety  Ambulation/Gait Ambulation/Gait assistance: Supervision Ambulation Distance (Feet): 500 Feet Assistive device: Rolling walker (2 wheeled) Gait Pattern/deviations: Step-through pattern;Decreased step length - left Gait velocity: decreased Gait velocity interpretation: Below normal speed for age/gender General Gait Details: doing well with weightbearing and cadence   Stairs Stairs: Yes   Stair Management: One rail Right;Step to pattern;Forwards Number of Stairs: 12 General stair comments: Supervision for safety  Wheelchair Mobility    Modified Rankin (Stroke Patients Only)       Balance Overall balance  assessment: Needs assistance Sitting-balance support: No upper extremity supported;Feet supported Sitting balance-Leahy Scale: Good     Standing balance support: No upper extremity supported Standing balance-Leahy Scale: Fair Standing balance comment: able to stand statically briefly to don gait belt                    Cognition Arousal/Alertness: Awake/alert Behavior During Therapy: WFL for tasks assessed/performed Overall Cognitive Status: Within Functional Limits for tasks assessed                      Exercises Total Joint Exercises Ankle Circles/Pumps: AROM;Both;20 reps;Supine Quad Sets: AROM;Left;10 reps;Supine Short Arc Quad: AROM;Left;10 reps;Supine Heel Slides: AROM;Left;10 reps;Supine Hip ABduction/ADduction: AROM;Left;10 reps;Supine Straight Leg Raises: AROM;Left;10 reps;Supine Goniometric ROM: 0-75    General Comments        Pertinent Vitals/Pain Pain Assessment: 0-10 Pain Score: 4  Pain Location: Lt knee Pain Descriptors / Indicators: Aching Pain Intervention(s): Monitored during session;Premedicated before session;Ice applied    Home Living                      Prior Function            PT Goals (current goals can now be found in the care plan section) Acute Rehab PT Goals Patient Stated Goal: be independent without pain Progress towards PT goals: Progressing toward goals    Frequency    7X/week      PT Plan Current plan remains appropriate    Co-evaluation             End of Session Equipment Utilized During Treatment: Gait belt Activity Tolerance: Patient tolerated treatment well Patient left: in chair;with  call bell/phone within reach;with family/visitor present     Time: [id number] PT Time Calculation (min) (ACUTE ONLY): 18 min  Charges:  $Gait Training: 8-22 mins $Therapeutic Exercise: 8-22 mins $Therapeutic Activity: 8-22 mins                    G Codes:      Scheryl Marten PT, DPT   [phone]  10/28/2016, 2:07 PM

## 2016-10-28 NOTE — Consult Note (Signed)
   [facility] CM Inpatient Consult   10/28/2016  MARRY MERRIMAN [birth year] [id number]   Came to visit Ms. Valere Dross on behalf of Link to [facility] Care Management program for [employer] employees/dependents with [facility] insurance. Discussed Link to Wellness program. She denies any current needs for the Link to Wellness program at this time. However, provided Link to [employer], brochure, 24-hr nurse line magnet. Confirmed best contact number as [phone] for post discharge call. Appreciative of visit.    Marthenia Rolling, MSN-Ed, RN,BSN [facility] Liaison [phone]

## 2016-10-28 NOTE — Progress Notes (Signed)
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 Provided emotional/spiritual support, esp. speaking of her work and her plans to return to work, and prayer. Pt much appreciated the visit.    10/28/16 0900  Clinical Encounter Type  Visited With Patient  Visit Type Initial;Post-op  Referral From Chaplain  Spiritual Encounters  Spiritual Needs Prayer;Emotional  Stress Factors  Patient Stress Factors Health changes;Loss of control   Gerrit Heck, Chaplain

## 2016-10-28 NOTE — Progress Notes (Signed)
Subjective: 1 Day Post-Op Procedure(s) (LRB): TOTAL KNEE ARTHROPLASTY (Left) Patient reports pain as mild.  Patient reported nausea/vomiting last night.  Feeling ok over past several hours.  No lightheadedness/dizziness, chest pain/sob.  Objective: Vital signs in last 24 hours: Temp:  [97 F (36.1 C)-99.5 F (37.5 C)] 99 F (37.2 C) (12/14 0440) Pulse Rate:  [75-105] 95 (12/14 0440) Resp:  [13-18] 18 (12/14 0440) BP: (122-144)/(81-93) 144/81 (12/14 0440) SpO2:  [95 %-100 %] 97 % (12/14 0440) Weight:  [95.5 kg (210 lb 8.6 oz)] 95.5 kg (210 lb 8.6 oz) (12/13 0858)  Intake/Output from previous day: 12/13 0701 - 12/14 0700 In: 1120 [P.O.:120; I.V.:1000] Out: 400 [Urine:350; Blood:50] Intake/Output this shift: No intake/output data recorded.   Recent Labs  10/28/16 0530  HGB 11.4*    Recent Labs  10/28/16 0530  WBC 7.2  RBC 3.86*  HCT 35.3*  PLT 213    Recent Labs  10/28/16 0530  NA 139  K 4.1  CL 104  CO2 28  BUN 6  CREATININE 0.72  GLUCOSE 129*  CALCIUM 8.5*   No results for input(s): LABPT, INR in the last 72 hours.  Neurologically intact Neurovascular intact Sensation intact distally Intact pulses distally Dorsiflexion/Plantar flexion intact Incision: dressing C/D/I No cellulitis present Compartment soft  Assessment/Plan: 1 Day Post-Op Procedure(s) (LRB): TOTAL KNEE ARTHROPLASTY (Left) Advance diet Up with therapy Discharge home with home health after second session of PT as long as she does well and feels comfortable going home ABLA-mild and stable Please remove ace bandage and apply ted hose prior to d/c  Fannie Knee 10/28/2016, 7:30 AM

## 2016-10-28 NOTE — Care Management Note (Signed)
Case Management Note  Patient Details  Name: Dana Williams MRN: [id number] Date of Birth: 08-15-1961  Subjective/Objective:                    Action/Plan:  CPM will be delivered to patient's home tomorrow Expected Discharge Date:                  Expected Discharge Plan:  [place]  In-House Referral:     Discharge planning Services  CM Consult  Post Acute Care Choice:  Durable Medical Equipment, Home Health Choice offered to:  Patient  DME Arranged:  Walker rolling, Continuous passive motion machine DME Agency:     HH Arranged:  PT [place]:  [facility] ([facility]  Status of Service:  Completed, signed off  If discussed at [employer] of Stay Meetings, dates discussed:    Additional Comments:  Marilu Favre, RN 10/28/2016, 10:32 AM

## 2016-10-29 ENCOUNTER — Other Ambulatory Visit: Payer: Self-pay | Admitting: *Deleted

## 2016-10-29 DIAGNOSIS — F329 Major depressive disorder, single episode, unspecified: Secondary | ICD-10-CM | POA: Diagnosis not present

## 2016-10-29 DIAGNOSIS — L4050 Arthropathic psoriasis, unspecified: Secondary | ICD-10-CM | POA: Diagnosis not present

## 2016-10-29 DIAGNOSIS — Z471 Aftercare following joint replacement surgery: Secondary | ICD-10-CM | POA: Diagnosis not present

## 2016-10-29 DIAGNOSIS — Z79891 Long term (current) use of opiate analgesic: Secondary | ICD-10-CM | POA: Diagnosis not present

## 2016-10-29 DIAGNOSIS — E785 Hyperlipidemia, unspecified: Secondary | ICD-10-CM | POA: Diagnosis not present

## 2016-10-29 DIAGNOSIS — Z96652 Presence of left artificial knee joint: Secondary | ICD-10-CM | POA: Diagnosis not present

## 2016-10-29 DIAGNOSIS — Z7982 Long term (current) use of aspirin: Secondary | ICD-10-CM | POA: Diagnosis not present

## 2016-10-29 NOTE — Patient Outreach (Signed)
Attempt made to contact [name], follow up on referral from [facility] hospital liaison for transition of care.  Recent hospitalization 12/13-12/14 for  left total Knee Arthroplasty.   HIPAA compliant voice message left with contact name and number.    Plan:  If no response, plan to follow up again telephonically in 3 days (next business day).    [name].   [place] Care Management  [phone]

## 2016-11-01 ENCOUNTER — Other Ambulatory Visit: Payer: Self-pay | Admitting: *Deleted

## 2016-11-01 DIAGNOSIS — L4050 Arthropathic psoriasis, unspecified: Secondary | ICD-10-CM | POA: Diagnosis not present

## 2016-11-01 DIAGNOSIS — E785 Hyperlipidemia, unspecified: Secondary | ICD-10-CM | POA: Diagnosis not present

## 2016-11-01 DIAGNOSIS — Z7982 Long term (current) use of aspirin: Secondary | ICD-10-CM | POA: Diagnosis not present

## 2016-11-01 DIAGNOSIS — Z79891 Long term (current) use of opiate analgesic: Secondary | ICD-10-CM | POA: Diagnosis not present

## 2016-11-01 DIAGNOSIS — Z96652 Presence of left artificial knee joint: Secondary | ICD-10-CM | POA: Diagnosis not present

## 2016-11-01 DIAGNOSIS — F329 Major depressive disorder, single episode, unspecified: Secondary | ICD-10-CM | POA: Diagnosis not present

## 2016-11-01 DIAGNOSIS — Z471 Aftercare following joint replacement surgery: Secondary | ICD-10-CM | POA: Diagnosis not present

## 2016-11-01 NOTE — Op Note (Signed)
Dana Williams, Dana Williams NO.:  [id number]  MEDICAL RECORD NO.:  [id number]  LOCATION:                                 FACILITY:  PHYSICIAN:  [name], M.D. DATE OF BIRTH:  1961/02/18  DATE OF PROCEDURE:  10/27/2016 DATE OF DISCHARGE:  10/28/2016                              OPERATIVE REPORT   PREOPERATIVE DIAGNOSIS:  Left knee end-stage arthritis, primary generalized.  POSTOPERATIVE DIAGNOSIS:  Left knee end-stage arthritis, primary generalized.  PROCEDURE:  Modified minimally-invasive left total knee replacement Stryker triathlon prosthesis.  A cemented pegged cruciate-retaining #4 femoral component.  Cemented #5 tibial component, 9-mm CS insert. Cemented resurfacing 35-mm patellar component.  SURGEON:  [name], MD  ASSISTANT:  Elmyra Ricks, PA, present throughout the entire case and necessary for timely completion of procedure.  ANESTHESIA:  Spinal.  BLOOD LOSS:  Minimal.  SPECIMENS:  None.  CULTURES:  None.  COMPLICATIONS:  None.  DRESSINGS:  Soft compressive knee immobilizer.  TOURNIQUET TIME:  50 minutes.  PROCEDURE IN DETAIL:  The patient was brought to the operating room and after adequate anesthesia had been obtained, tourniquet applied, prepped and draped in usual sterile fashion.  Exsanguinated with elevation of Esmarch.  Tourniquet inflated to 350 mmHg.  Straight incision above the patella down to tibial tubercle.  Medial arthrotomy, vastus splitting. Intramedullary guide, distal femur flexible rod.  8 mm resection 5 degrees of valgus.  Using epicondylar axis, the femur was sized, cut, and fitted for a cruciate retaining pegged #4 component.  Proximal tibial resection with extramedullary guide.  Size for #5 component. Rotation set with trials.  A 9-mm CS insert.  Resurfacing patella with 35 mm component after adequate resection.  She did have a bipartite patella, but this was solidly fixed and was just  incorporated in the replacement.  All trials removed.  Copious irrigation with pulse irrigating device.  Cement prepared, placed on all components, firmly seated.  Polyethylene attached to tibia, knee reduced.  Patella held with a clamp.  Once the cement hardened, the knee was irrigated again. Soft tissue was injected with Exparel.  Very pleased by mechanical axis, full motion, good stability.  Nicely balanced.  Good patellofemoral tracking.  Arthrotomy closed with Vicryl.  Subcutaneous and subcuticular closure.  Margins were injected with Marcaine.  Sterile compressive dressing applied.  Tourniquet deflated, removed.  Knee immobilizer applied.  Anesthesia reversed.  Brought to recovery room. Tolerated surgery well.  No complications.     [name], M.D.   ______________________________ [name], M.D.    DFM/MEDQ  D:  10/28/2016  T:  10/29/2016  Job:  [id number]

## 2016-11-01 NOTE — Patient Outreach (Signed)
Second attempt made to contact [name], follow up on referral from [facility] hospital liaison for transition of care.  Recent hospitalization 12/13-12/14 for left total knee arthroplasty.  HIPAA  Compliant voice message left with contact name and number.     Plan:  If no response, plan to follow up again tomorrow.    [name].   Home Care Management  [phone]

## 2016-11-02 ENCOUNTER — Other Ambulatory Visit: Payer: Self-pay | Admitting: *Deleted

## 2016-11-02 NOTE — Patient Outreach (Signed)
Third attempt made to contact [name], follow up on referral from [facility] hospital liaison for transition of care.  Recent hospitalization 12/13-12/14 for left total knee arthroplasty.   HIPAA complaint voice message left with contact name and number.    Plan: if no response to third attempt, per [facility] workflow to send unable to contact letter and if no response to letter in 10 business days, to close case.      [name].   [place] Care Management  [phone]

## 2016-11-03 ENCOUNTER — Encounter: Payer: Self-pay | Admitting: *Deleted

## 2016-11-03 DIAGNOSIS — Z471 Aftercare following joint replacement surgery: Secondary | ICD-10-CM | POA: Diagnosis not present

## 2016-11-03 DIAGNOSIS — Z7982 Long term (current) use of aspirin: Secondary | ICD-10-CM | POA: Diagnosis not present

## 2016-11-03 DIAGNOSIS — Z79891 Long term (current) use of opiate analgesic: Secondary | ICD-10-CM | POA: Diagnosis not present

## 2016-11-03 DIAGNOSIS — Z96652 Presence of left artificial knee joint: Secondary | ICD-10-CM | POA: Diagnosis not present

## 2016-11-03 DIAGNOSIS — F329 Major depressive disorder, single episode, unspecified: Secondary | ICD-10-CM | POA: Diagnosis not present

## 2016-11-03 DIAGNOSIS — E785 Hyperlipidemia, unspecified: Secondary | ICD-10-CM | POA: Diagnosis not present

## 2016-11-03 DIAGNOSIS — L4050 Arthropathic psoriasis, unspecified: Secondary | ICD-10-CM | POA: Diagnosis not present

## 2016-11-05 DIAGNOSIS — Z79891 Long term (current) use of opiate analgesic: Secondary | ICD-10-CM | POA: Diagnosis not present

## 2016-11-05 DIAGNOSIS — Z471 Aftercare following joint replacement surgery: Secondary | ICD-10-CM | POA: Diagnosis not present

## 2016-11-05 DIAGNOSIS — Z7982 Long term (current) use of aspirin: Secondary | ICD-10-CM | POA: Diagnosis not present

## 2016-11-05 DIAGNOSIS — Z96652 Presence of left artificial knee joint: Secondary | ICD-10-CM | POA: Diagnosis not present

## 2016-11-05 DIAGNOSIS — F329 Major depressive disorder, single episode, unspecified: Secondary | ICD-10-CM | POA: Diagnosis not present

## 2016-11-05 DIAGNOSIS — E785 Hyperlipidemia, unspecified: Secondary | ICD-10-CM | POA: Diagnosis not present

## 2016-11-05 DIAGNOSIS — L4050 Arthropathic psoriasis, unspecified: Secondary | ICD-10-CM | POA: Diagnosis not present

## 2016-11-09 DIAGNOSIS — Z7982 Long term (current) use of aspirin: Secondary | ICD-10-CM | POA: Diagnosis not present

## 2016-11-09 DIAGNOSIS — L4050 Arthropathic psoriasis, unspecified: Secondary | ICD-10-CM | POA: Diagnosis not present

## 2016-11-09 DIAGNOSIS — Z96652 Presence of left artificial knee joint: Secondary | ICD-10-CM | POA: Diagnosis not present

## 2016-11-09 DIAGNOSIS — F329 Major depressive disorder, single episode, unspecified: Secondary | ICD-10-CM | POA: Diagnosis not present

## 2016-11-09 DIAGNOSIS — E785 Hyperlipidemia, unspecified: Secondary | ICD-10-CM | POA: Diagnosis not present

## 2016-11-09 DIAGNOSIS — Z79891 Long term (current) use of opiate analgesic: Secondary | ICD-10-CM | POA: Diagnosis not present

## 2016-11-09 DIAGNOSIS — Z471 Aftercare following joint replacement surgery: Secondary | ICD-10-CM | POA: Diagnosis not present

## 2016-11-10 DIAGNOSIS — M1712 Unilateral primary osteoarthritis, left knee: Secondary | ICD-10-CM | POA: Diagnosis not present

## 2016-11-11 DIAGNOSIS — E785 Hyperlipidemia, unspecified: Secondary | ICD-10-CM | POA: Diagnosis not present

## 2016-11-11 DIAGNOSIS — Z7982 Long term (current) use of aspirin: Secondary | ICD-10-CM | POA: Diagnosis not present

## 2016-11-11 DIAGNOSIS — Z471 Aftercare following joint replacement surgery: Secondary | ICD-10-CM | POA: Diagnosis not present

## 2016-11-11 DIAGNOSIS — Z79891 Long term (current) use of opiate analgesic: Secondary | ICD-10-CM | POA: Diagnosis not present

## 2016-11-11 DIAGNOSIS — Z96652 Presence of left artificial knee joint: Secondary | ICD-10-CM | POA: Diagnosis not present

## 2016-11-11 DIAGNOSIS — L4050 Arthropathic psoriasis, unspecified: Secondary | ICD-10-CM | POA: Diagnosis not present

## 2016-11-11 DIAGNOSIS — F329 Major depressive disorder, single episode, unspecified: Secondary | ICD-10-CM | POA: Diagnosis not present

## 2016-11-11 MED FILL — METHOCARBAMOL 500 MG TABLET: 500 | 22 days supply | Qty: 90 | Fill #0

## 2016-11-11 MED FILL — OXYCODONE W/APAP 5/325 TAB: 5-325 | 5 days supply | Qty: 60 | Fill #0

## 2016-11-12 DIAGNOSIS — Z79891 Long term (current) use of opiate analgesic: Secondary | ICD-10-CM | POA: Diagnosis not present

## 2016-11-12 DIAGNOSIS — F329 Major depressive disorder, single episode, unspecified: Secondary | ICD-10-CM | POA: Diagnosis not present

## 2016-11-12 DIAGNOSIS — Z471 Aftercare following joint replacement surgery: Secondary | ICD-10-CM | POA: Diagnosis not present

## 2016-11-12 DIAGNOSIS — L4050 Arthropathic psoriasis, unspecified: Secondary | ICD-10-CM | POA: Diagnosis not present

## 2016-11-12 DIAGNOSIS — Z96652 Presence of left artificial knee joint: Secondary | ICD-10-CM | POA: Diagnosis not present

## 2016-11-12 DIAGNOSIS — E785 Hyperlipidemia, unspecified: Secondary | ICD-10-CM | POA: Diagnosis not present

## 2016-11-12 DIAGNOSIS — Z7982 Long term (current) use of aspirin: Secondary | ICD-10-CM | POA: Diagnosis not present

## 2016-11-15 DIAGNOSIS — M1712 Unilateral primary osteoarthritis, left knee: Secondary | ICD-10-CM | POA: Diagnosis not present

## 2016-11-15 DIAGNOSIS — Z96652 Presence of left artificial knee joint: Secondary | ICD-10-CM | POA: Diagnosis not present

## 2016-11-16 ENCOUNTER — Other Ambulatory Visit: Payer: Self-pay | Admitting: *Deleted

## 2016-11-16 ENCOUNTER — Encounter: Payer: Self-pay | Admitting: *Deleted

## 2016-11-16 DIAGNOSIS — Z471 Aftercare following joint replacement surgery: Secondary | ICD-10-CM | POA: Diagnosis not present

## 2016-11-16 DIAGNOSIS — Z96652 Presence of left artificial knee joint: Secondary | ICD-10-CM | POA: Diagnosis not present

## 2016-11-16 DIAGNOSIS — F329 Major depressive disorder, single episode, unspecified: Secondary | ICD-10-CM | POA: Diagnosis not present

## 2016-11-16 DIAGNOSIS — L4050 Arthropathic psoriasis, unspecified: Secondary | ICD-10-CM | POA: Diagnosis not present

## 2016-11-16 DIAGNOSIS — E785 Hyperlipidemia, unspecified: Secondary | ICD-10-CM | POA: Diagnosis not present

## 2016-11-16 DIAGNOSIS — Z79891 Long term (current) use of opiate analgesic: Secondary | ICD-10-CM | POA: Diagnosis not present

## 2016-11-16 DIAGNOSIS — Z7982 Long term (current) use of aspirin: Secondary | ICD-10-CM | POA: Diagnosis not present

## 2016-11-16 NOTE — Patient Outreach (Signed)
Received a call from [facility] member in response to unable to contact letter sent by RN CM.   Unable to contact letter sent due to 3 unsuccessful phone calls attempts made- no responses.   Spoke with [name], HIPAA/identity verified, discussed purpose of letter, phone call attempts from RN CM - follow up for transition of care (recent hospitalization 12/13-12/14 for left total knee arthroplasty). [name] reports doing well, has been busy working with  CPM machine, also [facility], to start outpatient PT next week.  [name]  reports pain is good, takes pain medication at night to help her sleep and prior to [facility] PT sessions, takes  muscle relaxer as needed during the day.   [name]  reports she needs to walk but it is too cold outside to which RN CM suggested going to [facility].  [name] reports  saw orthopedic MD's PA last week, to follow up again in 3 weeks. [name] reports no  infection to left knee surgical site, completed antibiotic, taking all medications.    As discussed with [name],  no further case management needs at this time.   Patient was recently discharged from hospital and all medications have been reviewed.  Plan:  RN CM to send successful outreach letter to [facility] member, includes [facility]'s contact number to call if needs arise in the future.     Dana Williams.   [facility]  [phone]

## 2016-11-17 DIAGNOSIS — Z7982 Long term (current) use of aspirin: Secondary | ICD-10-CM | POA: Diagnosis not present

## 2016-11-17 DIAGNOSIS — Z79891 Long term (current) use of opiate analgesic: Secondary | ICD-10-CM | POA: Diagnosis not present

## 2016-11-17 DIAGNOSIS — Z471 Aftercare following joint replacement surgery: Secondary | ICD-10-CM | POA: Diagnosis not present

## 2016-11-17 DIAGNOSIS — F329 Major depressive disorder, single episode, unspecified: Secondary | ICD-10-CM | POA: Diagnosis not present

## 2016-11-17 DIAGNOSIS — E785 Hyperlipidemia, unspecified: Secondary | ICD-10-CM | POA: Diagnosis not present

## 2016-11-17 DIAGNOSIS — L4050 Arthropathic psoriasis, unspecified: Secondary | ICD-10-CM | POA: Diagnosis not present

## 2016-11-17 DIAGNOSIS — Z96652 Presence of left artificial knee joint: Secondary | ICD-10-CM | POA: Diagnosis not present

## 2016-11-19 DIAGNOSIS — L4050 Arthropathic psoriasis, unspecified: Secondary | ICD-10-CM | POA: Diagnosis not present

## 2016-11-19 DIAGNOSIS — Z7982 Long term (current) use of aspirin: Secondary | ICD-10-CM | POA: Diagnosis not present

## 2016-11-19 DIAGNOSIS — E785 Hyperlipidemia, unspecified: Secondary | ICD-10-CM | POA: Diagnosis not present

## 2016-11-19 DIAGNOSIS — Z79891 Long term (current) use of opiate analgesic: Secondary | ICD-10-CM | POA: Diagnosis not present

## 2016-11-19 DIAGNOSIS — Z471 Aftercare following joint replacement surgery: Secondary | ICD-10-CM | POA: Diagnosis not present

## 2016-11-19 DIAGNOSIS — Z96652 Presence of left artificial knee joint: Secondary | ICD-10-CM | POA: Diagnosis not present

## 2016-11-19 DIAGNOSIS — F329 Major depressive disorder, single episode, unspecified: Secondary | ICD-10-CM | POA: Diagnosis not present

## 2016-11-22 ENCOUNTER — Ambulatory Visit: Payer: 59 | Attending: Orthopedic Surgery

## 2016-11-22 DIAGNOSIS — R262 Difficulty in walking, not elsewhere classified: Secondary | ICD-10-CM | POA: Insufficient documentation

## 2016-11-22 DIAGNOSIS — M25562 Pain in left knee: Secondary | ICD-10-CM | POA: Diagnosis not present

## 2016-11-22 DIAGNOSIS — R600 Localized edema: Secondary | ICD-10-CM | POA: Diagnosis present

## 2016-11-22 DIAGNOSIS — M6281 Muscle weakness (generalized): Secondary | ICD-10-CM | POA: Diagnosis present

## 2016-11-22 NOTE — Therapy (Signed)
[place], [place], [address] Phone: [phone]   Fax:  [phone]  Physical Therapy Evaluation  Patient Details  Name: Dana Williams MRN: [id number] Date of Birth: 06/19/1961 Referring Provider: Kathryne Hitch, MD  Encounter Date: 11/22/2016      PT End of Session - 11/22/16 1056    Visit Number 1   Number of Visits 16   Date for PT Re-Evaluation 01/17/17   PT Start Time 1012   PT Stop Time 1100   PT Time Calculation (min) 48 min   Activity Tolerance Patient tolerated treatment well;No increased pain   Behavior During Therapy WFL for tasks assessed/performed      Past Medical History:  Diagnosis Date  . Arthritis   . Depression   . GERD (gastroesophageal reflux disease)   . H/O psoriatic arthritis    DX  age 89---- in remission for 20 years  . History of hiatal hernia   . History of kidney stones   . Hyperlipidemia   . Personal history of colonic polyp - adenoma 01/29/2014  . Plantar fasciitis of left foot   . PONV (postoperative nausea and vomiting)   . Renal calculus, left   . Sigmoid diverticulosis   . Vitamin D deficiency     Past Surgical History:  Procedure Laterality Date  . CLOSED MANIPULATION SHOULDER    . COLONOSCOPY WITH PROPOFOL  01-25-2014  . CYSTO/  BILATERAL RETROGRADE PYELOGRAM/  BILATERAL URETEROSCOPIC LASER LITHOTRIPSY STONE EXTRACTIONS/  BILATERAL STENT PLACEMENT  02-13-2010  . CYSTOSCOPY WITH RETROGRADE PYELOGRAM, URETEROSCOPY AND STENT PLACEMENT Left 04/21/2015   Procedure: CYSTOSCOPY WITH RETROGRADE PYELOGRAM, URETEROSCOPY, STONE EXTRACTION AND STENT PLACEMENT;  Surgeon: Franchot Gallo, MD;  Location: [facility];  Service: Urology;  Laterality: Left;  . CYSTOURETHROSCOPY/  PLACEMENT LYNX SUPRAPUBIC SLING  05-24-2008  . HAND TENDON SURGERY Right 1993  . HOLMIUM LASER APPLICATION Left [id number]   Procedure: HOLMIUM LASER APPLICATION;  Surgeon: Franchot Gallo, MD;  Location: [facility];  Service: Urology;  Laterality: Left;  . LAPAROSCOPIC CHOLECYSTECTOMY  01-31-2002  . LEFT URETEROSCOPIC STONE EXTRACTION/  STENT PLACEMENT  10-06-2005  . RHINOPLASTY  2007  . STRABISMUS SURGERY Left age 35  . TOTAL KNEE ARTHROPLASTY Left 10/27/2016   Procedure: TOTAL KNEE ARTHROPLASTY;  Surgeon: [name], MD;  Location: [facility];  Service: Orthopedics;  Laterality: Left;  . WISDOM TOOTH EXTRACTION  age 74    There were no vitals filed for this visit.       Subjective Assessment - 11/22/16 1014    Subjective She is post TKA LT . She was in hospital 1 day and received HHPT  for 8 sessions and stopped 11/19/16.  Takes only robaxin during day . Throbbs at nigh.    Limitations Standing;Walking  stairs,    How long can you sit comfortably? As needed with legs , 90 dgrees flexecd   How long can you stand comfortably? 20 min   How long can you walk comfortably? [place] distances   Patient Stated Goals She wants to  improve flexion of LT knee    Currently in Pain? Yes   Pain Score 2    Pain Location Knee   Pain Orientation Left;Posterior;Anterior   Pain Descriptors / Indicators Sore;Sharp;Shooting   Pain Type Surgical pain   Pain Onset 1 to 4 weeks ago   Pain Frequency Intermittent   Aggravating Factors  Bending knee, exercises , prolonged standing  Pain Relieving Factors Ice /elevation, rest   Multiple Pain Sites No            [facility] PT Assessment - 11/22/16 0001      Assessment   Medical Diagnosis LT TKA   Referring Provider Kathryne Hitch, MD   Onset Date/Surgical Date 10/27/16   Next MD Visit 3 weeks   Prior Therapy 7-8 visits of HHPT     Precautions   Precaution Comments No kneeling and don't get in tub     Restrictions   Other Position/Activity Restrictions None on feet     Balance Screen   Has the patient fallen in the past 6 months No   Has the patient had a decrease in activity level because of a fear of  falling?  Yes   Is the patient reluctant to leave their home because of a fear of falling?  No     Home Environment   Living Environment Private residence   Living Arrangements Alone   Type of Home --  condo   Home Access Level entry   Home Layout Multi-level  17 steps     Prior Function   Level of Independence Independent   Vocation Full time employment   Vocation Requirements Standing 12 hour shifts     Cognition   Overall Cognitive Status Within Functional Limits for tasks assessed     Observation/Other Assessments   Focus on Therapeutic Outcomes (FOTO)  51% limited     Observation/Other Assessments-Edema    Edema --  Minimal Lt knee     Posture/Postural Control   Posture Comments LT shoulder lower with decr trunk rotation and decr weight LT leg (mild) reports does stairs slowly with one rail more on descent     ROM / Strength   AROM / PROM / Strength AROM;PROM;Strength     AROM   AROM Assessment Site Knee   Right/Left Knee Right;Left   Right Knee Extension 0   Right Knee Flexion 126   Left Knee Extension -20   Left Knee Flexion 95     PROM   PROM Assessment Site Knee   Right/Left Knee Left   Left Knee Extension -12   Left Knee Flexion 104     Strength   Strength Assessment Site Knee   Right/Left Knee Right;Left   Right Knee Flexion 5/5   Right Knee Extension 5/5   Left Knee Flexion 4+/5   Left Knee Extension 4+/5     Flexibility   Soft Tissue Assessment /Muscle Length yes   Hamstrings 55 degrees bilaterally     Palpation   Patella mobility decreased bilaterally     Ambulation/Gait   Gait Comments No device, decr trunk  rot , stance in knee flexion, decr weight to LT but minimally                           PT Education - 11/22/16 1125    Education provided Yes   Education Details POC, HEP, considerable progress so far with her rehab   Person(s) Educated Patient   Methods Explanation;Tactile cues;Verbal cues;Handout    Comprehension Returned demonstration;Verbalized understanding          PT Short Term Goals - 11/22/16 1119      PT SHORT TERM GOAL #1   Title She will be independent with inital HEP   Time 4   Period Weeks   Status New     PT SHORT  TERM GOAL #2   Title she will improve active LT knee extension to -12 degrees to improve gait pattern   Time 4   Period Weeks   Status New     PT SHORT TERM GOAL #3   Title She will improve active LT knee flexion to 108 degrees to improve gait pattern   Time 4   Period Weeks   Status New     PT SHORT TERM GOAL #4   Title she will improve strength to 5/5 quads and hamstrings LT to improve gait   Time 4   Period Weeks   Status New           PT Long Term Goals - 11/22/16 1122      PT LONG TERM GOAL #1   Title She will be independent with all HEP issued .    Time 8   Period Weeks   Status New     PT LONG TERM GOAL #2   Title She will walk staris step ovestep with one rail safely   Time 8   Period Weeks   Status New     PT LONG TERM GOAL #3   Title she will report able to be on feet for at least 4 hours to prep for return to work.    Time 8   Period Weeks   Status New     PT LONG TERM GOAL #4   Title she will be able to squat for picking up 20 pounds floor to waist.    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    Rehab Potential Excellent   PT Frequency 2x / week   PT Duration 8 weeks   PT Treatment/Interventions Cryotherapy;Electrical Stimulation;Stair training;Passive range of motion;Patient/family education;Therapeutic exercise;Taping;Manual techniques;Gait training   PT Next Visit Plan PRE for strength , closed chain to tolerance, modalities as needed manual for ROM   PT Home Exercise Plan Prone hang and supine knee flexion with hip flex 90 degrees for ROM     Consulted and Agree with Plan of Care Patient      Patient will benefit from skilled therapeutic intervention in order to improve the following deficits and impairments:  Decreased activity tolerance, Increased edema, Decreased strength, Pain, Decreased range of motion, Difficulty walking  Visit Diagnosis: Left knee pain, unspecified chronicity - Plan: PT plan of care cert/re-cert  Muscle weakness (generalized) - Plan: PT plan of care cert/re-cert  Difficulty in walking, not elsewhere classified - Plan: PT plan of care cert/re-cert  Localized edema - Plan: PT plan of care  cert/re-cert     Problem List Patient Active Problem List   Diagnosis Date Noted  . Primary localized osteoarthritis of left knee 10/27/2016  . Personal history of colonic polyp - adenoma 01/29/2014  . Hyperlipidemia 04/18/2012    Darrel Hoover  PT 11/22/2016, 4:51 PM  [facility] [address] [place], [place], [address] Phone: [phone]   Fax:  [phone]  Name: Dana Williams MRN: [id number] Date of Birth: 05-15-1961

## 2016-11-22 NOTE — Patient Instructions (Signed)
Knee Extension Mobilization: Hang (Prone)    With table supporting thighs, place ____ pound weight on right ankle. Hold __5 min__ minutes. Repeat __2__ times per set. Do ____ sets per session. Do _2-3___ sessions per day.  http://orth.exer.us/723   Copyright  VHI. All rights reserved.

## 2016-11-24 ENCOUNTER — Ambulatory Visit: Payer: 59 | Admitting: Physical Therapy

## 2016-11-24 DIAGNOSIS — M25562 Pain in left knee: Secondary | ICD-10-CM | POA: Diagnosis not present

## 2016-11-24 DIAGNOSIS — R262 Difficulty in walking, not elsewhere classified: Secondary | ICD-10-CM

## 2016-11-24 DIAGNOSIS — M6281 Muscle weakness (generalized): Secondary | ICD-10-CM | POA: Diagnosis not present

## 2016-11-24 DIAGNOSIS — R600 Localized edema: Secondary | ICD-10-CM

## 2016-11-24 NOTE — Therapy (Signed)
[place], [place], [address] Phone: [phone]   Fax:  [phone]  Physical Therapy Treatment  Patient Details  Name: Dana Williams MRN: [id number] Date of Birth: May 22, 1961 Referring Provider: Kathryne Hitch, MD  Encounter Date: 11/24/2016      PT End of Session - 11/24/16 1448    Visit Number 2   Number of Visits 16   Date for PT Re-Evaluation 01/17/17   PT Start Time [id number]   PT Stop Time 1529   PT Time Calculation (min) 54 min   Activity Tolerance Patient tolerated treatment well   Behavior During Therapy [facility] for tasks assessed/performed      Past Medical History:  Diagnosis Date  . Arthritis   . Depression   . GERD (gastroesophageal reflux disease)   . H/O psoriatic arthritis    DX  age 56---- in remission for 20 years  . History of hiatal hernia   . History of kidney stones   . Hyperlipidemia   . Personal history of colonic polyp - adenoma 01/29/2014  . Plantar fasciitis of left foot   . PONV (postoperative nausea and vomiting)   . Renal calculus, left   . Sigmoid diverticulosis   . Vitamin D deficiency     Past Surgical History:  Procedure Laterality Date  . CLOSED MANIPULATION SHOULDER    . COLONOSCOPY WITH PROPOFOL  01-25-2014  . CYSTO/  BILATERAL RETROGRADE PYELOGRAM/  BILATERAL URETEROSCOPIC LASER LITHOTRIPSY STONE EXTRACTIONS/  BILATERAL STENT PLACEMENT  02-13-2010  . CYSTOSCOPY WITH RETROGRADE PYELOGRAM, URETEROSCOPY AND STENT PLACEMENT Left 04/21/2015   Procedure: CYSTOSCOPY WITH RETROGRADE PYELOGRAM, URETEROSCOPY, STONE EXTRACTION AND STENT PLACEMENT;  Surgeon: Franchot Gallo, MD;  Location: [facility];  Service: Urology;  Laterality: Left;  . CYSTOURETHROSCOPY/  PLACEMENT LYNX SUPRAPUBIC SLING  05-24-2008  . HAND TENDON SURGERY Right 1993  . HOLMIUM LASER APPLICATION Left [id number]   Procedure: HOLMIUM LASER APPLICATION;  Surgeon: Franchot Gallo, MD;   Location: [facility];  Service: Urology;  Laterality: Left;  . LAPAROSCOPIC CHOLECYSTECTOMY  01-31-2002  . LEFT URETEROSCOPIC STONE EXTRACTION/  STENT PLACEMENT  10-06-2005  . RHINOPLASTY  2007  . STRABISMUS SURGERY Left age 88  . TOTAL KNEE ARTHROPLASTY Left 10/27/2016   Procedure: TOTAL KNEE ARTHROPLASTY;  Surgeon: [name], MD;  Location: [facility];  Service: Orthopedics;  Laterality: Left;  . WISDOM TOOTH EXTRACTION  age 30    There were no vitals filed for this visit.      Subjective Assessment - 11/24/16 1440    Subjective My muscles are sore today.  Pt with questions regarding her progress and his questioning (Do you have a "plan").     Currently in Pain? 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             [facility] PT Assessment - 11/24/16 1455      AROM   Left Knee Extension 10   Left Knee Flexion 104  [facility] PT Treatment/Exercise - 11/24/16 1514      Knee/Hip Exercises: Stretches   Active Hamstring Stretch Left;2 reps;30 seconds     Knee/Hip Exercises: Aerobic   Nustep L4 Ue and LE for 5 min      Knee/Hip Exercises: Supine   Hip Adduction Isometric Strengthening;Both;1 set;10 reps   Bridges with [employer] Strengthening;Both;1 set;10 reps   Straight Leg Raises Strengthening;Left;1 set;10 reps   Straight Leg Raise with External Rotation Strengthening;Left;1 set;10 reps   Knee Extension AAROM;Left;1 set;5 sets   Knee Flexion AAROM;Left;1 set;10 reps     Vasopneumatic   Number Minutes Vasopneumatic  15 minutes   Vasopnuematic Location  Knee   Vasopneumatic Pressure Medium   Vasopneumatic  Temperature  32                PT Education - 11/24/16 1519    Education provided Yes   Education Details progression, HEP, ROM goals    Person(s) Educated Patient   Methods Explanation   Comprehension Verbalized understanding          PT Short Term Goals - 11/22/16 1119      PT SHORT TERM GOAL #1   Title She will be independent with inital HEP   Time 4   Period Weeks   Status New     PT SHORT TERM GOAL #2   Title she will improve active LT knee extension to -12 degrees to improve gait pattern   Time 4   Period Weeks   Status New     PT SHORT TERM GOAL #3   Title She will improve active LT knee flexion to 108 degrees to improve gait pattern   Time 4   Period Weeks   Status New     PT SHORT TERM GOAL #4   Title she will improve strength to 5/5 quads and hamstrings LT to improve gait   Time 4   Period Weeks   Status New           PT Long Term Goals - 11/22/16 1122      PT LONG TERM GOAL #1   Title She will be independent with all HEP issued .    Time 8   Period Weeks   Status New     PT LONG TERM GOAL #2   Title She will walk staris step ovestep with one rail safely   Time 8   Period Weeks   Status New     PT LONG TERM GOAL #3   Title she will report able to be on feet for at least 4 hours to prep for return to work.    Time 8   Period Weeks   Status New     PT LONG TERM GOAL #4   Title she will be able to squat for picking up 20 pounds floor to waist.    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  Made modifcations to HEP to add in AAROM with sheet and supine SLR.  Goals in progress.    PT Next Visit Plan PRE for strength , closed chain to tolerance, modalities as needed manual for ROM   PT Home Exercise Plan Prone hang and supine knee flexion with hip flex 90 degrees for ROM  , level 1-2 knee    Consulted and Agree with Plan of Care Patient      Patient will benefit from skilled therapeutic intervention in order to improve the following deficits and impairments:  Decreased activity tolerance, Increased edema, Decreased strength, Pain, Decreased range of motion, Difficulty walking  Visit Diagnosis: Left knee pain, unspecified chronicity  Muscle weakness (generalized)  Difficulty in walking, not elsewhere classified  Localized edema     Problem List Patient Active Problem List   Diagnosis Date Noted  . Primary localized osteoarthritis of left knee 10/27/2016  . Personal history of colonic polyp - adenoma 01/29/2014  . Hyperlipidemia 04/18/2012    PAA,[name] 11/24/2016, 3:20 PM  [facility] [address] [place], [place], [address] Phone: [phone]   Fax:  [phone]  Name: ZAKARIAH PARNES MRN: [id number] Date of Birth: 06/07/1961   Raeford Razor, PT 11/24/16 3:20 PM Phone: [phone] Fax: [phone]

## 2016-11-29 ENCOUNTER — Ambulatory Visit: Payer: 59 | Admitting: Physical Therapy

## 2016-11-29 DIAGNOSIS — M6281 Muscle weakness (generalized): Secondary | ICD-10-CM

## 2016-11-29 DIAGNOSIS — R262 Difficulty in walking, not elsewhere classified: Secondary | ICD-10-CM | POA: Diagnosis not present

## 2016-11-29 DIAGNOSIS — R600 Localized edema: Secondary | ICD-10-CM

## 2016-11-29 DIAGNOSIS — M25562 Pain in left knee: Secondary | ICD-10-CM | POA: Diagnosis not present

## 2016-11-29 NOTE — Therapy (Signed)
[place], [place], [address] Phone: [phone]   Fax:  [phone]  Physical Therapy Treatment  Patient Details  Name: Dana Williams MRN: [id number] Date of Birth: 1961-03-27 Referring Provider: Kathryne Hitch, MD  Encounter Date: 11/29/2016      PT End of Session - 11/29/16 1201    Visit Number 3   Number of Visits 16   Date for PT Re-Evaluation 01/17/17   PT Start Time 1101   PT Stop Time 1211   PT Time Calculation (min) 70 min   Activity Tolerance Patient tolerated treatment well   Behavior During Therapy [facility] for tasks assessed/performed      Past Medical History:  Diagnosis Date  . Arthritis   . Depression   . GERD (gastroesophageal reflux disease)   . H/O psoriatic arthritis    DX  age 39---- in remission for 20 years  . History of hiatal hernia   . History of kidney stones   . Hyperlipidemia   . Personal history of colonic polyp - adenoma 01/29/2014  . Plantar fasciitis of left foot   . PONV (postoperative nausea and vomiting)   . Renal calculus, left   . Sigmoid diverticulosis   . Vitamin D deficiency     Past Surgical History:  Procedure Laterality Date  . CLOSED MANIPULATION SHOULDER    . COLONOSCOPY WITH PROPOFOL  01-25-2014  . CYSTO/  BILATERAL RETROGRADE PYELOGRAM/  BILATERAL URETEROSCOPIC LASER LITHOTRIPSY STONE EXTRACTIONS/  BILATERAL STENT PLACEMENT  02-13-2010  . CYSTOSCOPY WITH RETROGRADE PYELOGRAM, URETEROSCOPY AND STENT PLACEMENT Left 04/21/2015   Procedure: CYSTOSCOPY WITH RETROGRADE PYELOGRAM, URETEROSCOPY, STONE EXTRACTION AND STENT PLACEMENT;  Surgeon: Franchot Gallo, MD;  Location: [facility];  Service: Urology;  Laterality: Left;  . CYSTOURETHROSCOPY/  PLACEMENT LYNX SUPRAPUBIC SLING  05-24-2008  . HAND TENDON SURGERY Right 1993  . HOLMIUM LASER APPLICATION Left [id number]   Procedure: HOLMIUM LASER APPLICATION;  Surgeon: Franchot Gallo, MD;   Location: [facility];  Service: Urology;  Laterality: Left;  . LAPAROSCOPIC CHOLECYSTECTOMY  01-31-2002  . LEFT URETEROSCOPIC STONE EXTRACTION/  STENT PLACEMENT  10-06-2005  . RHINOPLASTY  2007  . STRABISMUS SURGERY Left age 43  . TOTAL KNEE ARTHROPLASTY Left 10/27/2016   Procedure: TOTAL KNEE ARTHROPLASTY;  Surgeon: [name], MD;  Location: [place];  Service: Orthopedics;  Laterality: Left;  . WISDOM TOOTH EXTRACTION  age 74    There were no vitals filed for this visit.      Subjective Assessment - 11/29/16 1102    Subjective I forgot my exercises but ive been doing them.  When will I get the swelling out? I know its gonna take time.    Currently in Pain? Yes  stiffness , not really pain             [facility] Adult PT Treatment/Exercise - 11/29/16 0001      Knee/Hip Exercises: Stretches   Active Hamstring Stretch Left;2 reps;30 seconds   Knee: Self-Stretch to increase Flexion Left;3 reps;30 seconds   Other Knee/Hip Stretches slant board 30 sec x 5      Knee/Hip Exercises: Aerobic   Nustep L6 UE and LE for 5 min      Knee/Hip Exercises: Standing   Heel Raises Both;1 set;20 reps   Terminal Knee Extension Strengthening;Left;1 set;15 reps   Hip Abduction Stengthening;Right;Left;1 set;10 reps   Lateral Step Up Left;1 set;10 reps   Lateral Step Up Limitations 6 inch  Forward Step Up Left;1 set;10 reps   Forward Step Up Limitations 6 inch 0-1 UE A    Functional Squat 1 set;20 reps   Functional Squat Limitations no UE assist , no pain    Wall Squat 1 set;10 reps   Other Standing Knee Exercises step stretch for flexion x 10, 10 sec hold      Vasopneumatic   Number Minutes Vasopneumatic  15 minutes   Vasopnuematic Location  Knee   Vasopneumatic Pressure Medium   Vasopneumatic Temperature  32     Manual Therapy   Manual Therapy Taping   Kinesiotex Edema     Kinesiotix   Edema 2 fans crossing ant knee                 PT Education - 11/29/16  1200    Education provided Yes   Education Details progress, HEP and modifcations , K tape    Person(s) Educated Patient   Methods Explanation;Demonstration   Comprehension Verbalized understanding;Returned demonstration          PT Short Term Goals - 11/29/16 1203      PT SHORT TERM GOAL #1   Title She will be independent with inital HEP   Status Achieved     PT SHORT TERM GOAL #2   Title she will improve active LT knee extension to -12 degrees to improve gait pattern   Status On-going     PT SHORT TERM GOAL #3   Title She will improve active LT knee flexion to 108 degrees to improve gait pattern   Status On-going     PT SHORT TERM GOAL #4   Title she will improve strength to 5/5 quads and hamstrings LT to improve gait   Status On-going           PT Long Term Goals - 11/29/16 1203      PT LONG TERM GOAL #1   Title She will be independent with all HEP issued .    Status On-going     PT LONG TERM GOAL #2   Title She will walk staris step ovestep with one rail safely   Status On-going     PT LONG TERM GOAL #3   Title she will report able to be on feet for at least 4 hours to prep for return to work.    Status On-going     PT LONG TERM GOAL #4   Title she will be able to squat for picking up 20 pounds floor to waist.    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  AAROM -11 to 109 deg today.  Many questions throughout session , she wonders about returning to work, wants to have full function of knee to maximize performance.    PT Next Visit Plan try prone  for hip strength, knee AAROM and repeat tape, Vaso if needed, measure edema  PT Home Exercise Plan Prone hang and supine knee flexion with hip flex 90 degrees for ROM  , level 1-2 knee    Consulted and Agree with Plan of Care Patient      Patient will benefit from skilled therapeutic intervention in order to improve the following deficits and impairments:  Decreased activity tolerance, Increased edema, Decreased strength, Pain, Decreased range of motion, Difficulty walking  Visit Diagnosis: Left knee pain, unspecified chronicity  Muscle weakness (generalized)  Difficulty in walking, not elsewhere classified  Localized edema     Problem List Patient Active Problem List   Diagnosis Date Noted  . Primary localized osteoarthritis of left knee 10/27/2016  . Personal history of colonic polyp - adenoma 01/29/2014  . Hyperlipidemia 04/18/2012    PAA,[name] 11/29/2016, 12:04 PM  [facility] [address] [place], [place], [address] Phone: [phone]   Fax:  [phone]  Name: Dana Williams MRN: [id number] Date of Birth: [birth year]  Raeford Razor, PT 11/29/16 12:05 PM Phone: [phone] Fax: [phone]

## 2016-12-01 ENCOUNTER — Ambulatory Visit: Payer: 59 | Admitting: Physical Therapy

## 2016-12-02 ENCOUNTER — Ambulatory Visit: Payer: 59 | Admitting: Physical Therapy

## 2016-12-06 ENCOUNTER — Ambulatory Visit: Payer: 59 | Admitting: Physical Therapy

## 2016-12-06 DIAGNOSIS — M6281 Muscle weakness (generalized): Secondary | ICD-10-CM

## 2016-12-06 DIAGNOSIS — R262 Difficulty in walking, not elsewhere classified: Secondary | ICD-10-CM

## 2016-12-06 DIAGNOSIS — M25562 Pain in left knee: Secondary | ICD-10-CM | POA: Diagnosis not present

## 2016-12-06 DIAGNOSIS — R600 Localized edema: Secondary | ICD-10-CM

## 2016-12-06 NOTE — Therapy (Signed)
[place], [place], [address] Phone: [phone]   Fax:  [phone]  Physical Therapy Treatment  Patient Details  Name: Dana Williams MRN: [id number] Date of Birth: 25-Mar-1961 Referring Provider: Kathryne Hitch, MD  Encounter Date: 12/06/2016      PT End of Session - 12/06/16 1104    Visit Number 4   Number of Visits 16   Date for PT Re-Evaluation 01/17/17   PT Start Time 1100   PT Stop Time 1158   PT Time Calculation (min) 58 min   Activity Tolerance Patient tolerated treatment well   Behavior During Therapy [facility] for tasks assessed/performed      Past Medical History:  Diagnosis Date  . Arthritis   . Depression   . GERD (gastroesophageal reflux disease)   . H/O psoriatic arthritis    DX  age 56---- in remission for 20 years  . History of hiatal hernia   . History of kidney stones   . Hyperlipidemia   . Personal history of colonic polyp - adenoma 01/29/2014  . Plantar fasciitis of left foot   . PONV (postoperative nausea and vomiting)   . Renal calculus, left   . Sigmoid diverticulosis   . Vitamin D deficiency     Past Surgical History:  Procedure Laterality Date  . CLOSED MANIPULATION SHOULDER    . COLONOSCOPY WITH PROPOFOL  01-25-2014  . CYSTO/  BILATERAL RETROGRADE PYELOGRAM/  BILATERAL URETEROSCOPIC LASER LITHOTRIPSY STONE EXTRACTIONS/  BILATERAL STENT PLACEMENT  02-13-2010  . CYSTOSCOPY WITH RETROGRADE PYELOGRAM, URETEROSCOPY AND STENT PLACEMENT Left 04/21/2015   Procedure: CYSTOSCOPY WITH RETROGRADE PYELOGRAM, URETEROSCOPY, STONE EXTRACTION AND STENT PLACEMENT;  Surgeon: Franchot Gallo, MD;  Location: [facility];  Service: Urology;  Laterality: Left;  . CYSTOURETHROSCOPY/  PLACEMENT LYNX SUPRAPUBIC SLING  05-24-2008  . HAND TENDON SURGERY Right 1993  . HOLMIUM LASER APPLICATION Left [id number]   Procedure: HOLMIUM LASER APPLICATION;  Surgeon: Franchot Gallo, MD;   Location: [facility];  Service: Urology;  Laterality: Left;  . LAPAROSCOPIC CHOLECYSTECTOMY  01-31-2002  . LEFT URETEROSCOPIC STONE EXTRACTION/  STENT PLACEMENT  10-06-2005  . RHINOPLASTY  2007  . STRABISMUS SURGERY Left age 56  . TOTAL KNEE ARTHROPLASTY Left 10/27/2016   Procedure: TOTAL KNEE ARTHROPLASTY;  Surgeon: [name], MD;  Location: [place];  Service: Orthopedics;  Laterality: Left;  . WISDOM TOOTH EXTRACTION  age 56    There were no vitals filed for this visit.      Subjective Assessment - 12/06/16 1105    Subjective No pain really.  I have not been sleeping much.  Knee is so tight.    Currently in Pain? No/denies            [facility] PT Assessment - 12/06/16 1105      PROM   Left Knee Flexion 110              [facility] Adult PT Treatment/Exercise - 12/06/16 1121      Knee/Hip Exercises: Stretches   Active Hamstring Stretch Left;2 reps   Water quality scientist relax    Knee: Self-Stretch to increase Flexion Left;5 reps     Knee/Hip Exercises: Aerobic   Stationary Bike level 1-2 recumbant bike for 6 min for AROM able to get full revolution after 20-30 sec      Knee/Hip Exercises: Prone   Hamstring Curl 1 set;20 reps   Hamstring Curl  Limitations 4 lbs    Hip Extension Strengthening;Left;1 set;10 reps   Other Prone Exercises prone quad set x 10    Other Prone Exercises bent knee hip extension x 10      Vasopneumatic   Number Minutes Vasopneumatic  15 minutes   Vasopnuematic Location  Knee   Vasopneumatic Pressure Medium   Vasopneumatic Temperature  32     Manual Therapy   Manual Therapy Edema management;Joint mobilization;Taping   Edema Management retro massage    Joint Mobilization GR II flexion and ext    Kinesiotex Edema     Kinesiotix   Edema 3 fans crossing ant knee                 PT Education - 12/06/16 1157    Education provided Yes   Education Details prognosis, walking  program, contract relax    Person(s) Educated Patient   Methods Explanation;Demonstration   Comprehension Verbalized understanding          PT Short Term Goals - 11/29/16 1203      PT SHORT TERM GOAL #1   Title She will be independent with inital HEP   Status Achieved     PT SHORT TERM GOAL #2   Title she will improve active LT knee extension to -12 degrees to improve gait pattern   Status On-going     PT SHORT TERM GOAL #3   Title She will improve active LT knee flexion to 108 degrees to improve gait pattern   Status On-going     PT SHORT TERM GOAL #4   Title she will improve strength to 5/5 quads and hamstrings LT to improve gait   Status On-going           PT Long Term Goals - 11/29/16 1203      PT LONG TERM GOAL #1   Title She will be independent with all HEP issued .    Status On-going     PT LONG TERM GOAL #2   Title She will walk staris step ovestep with one rail safely   Status On-going     PT LONG TERM GOAL #3   Title she will report able to be on feet for at least 4 hours to prep for return to work.    Status On-going     PT LONG TERM GOAL #4   Title she will be able to squat for picking up 20 pounds floor to waist.    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  PLans to walk today for 30 min or more.  Tape and Vaso helps the "tightness" she feels in her knee.  flexion to 110 deg. today   PT Next Visit Plan progress PRE and AROM for knee, mobs   PT Home Exercise Plan Prone hang and supine knee flexion with hip flex 90 degrees for ROM  , level 1-2 knee  Consulted and Agree with Plan of Care Patient      Patient will benefit from skilled therapeutic intervention in order to improve the following deficits and impairments:  Decreased activity tolerance, Increased edema, Decreased strength, Pain, Decreased range of motion, Difficulty walking  Visit Diagnosis: Left knee pain, unspecified chronicity  Muscle weakness (generalized)  Difficulty in walking, not elsewhere classified  Localized edema     Problem List Patient Active Problem List   Diagnosis Date Noted  . Primary localized osteoarthritis of left knee 10/27/2016  . Personal history of colonic polyp - adenoma 01/29/2014  . Hyperlipidemia 04/18/2012    PAA,[name] 12/06/2016, 12:44 PM  [place] [facility] [address] [place], [place], [address] Phone: [phone]   Fax:  [phone]  Name: Dana Williams MRN: [id number] Date of Birth: 05/20/1961  Raeford Razor, PT 12/06/16 12:45 PM Phone: [phone] Fax: [phone]

## 2016-12-08 ENCOUNTER — Ambulatory Visit: Payer: 59 | Admitting: Physical Therapy

## 2016-12-08 DIAGNOSIS — M6281 Muscle weakness (generalized): Secondary | ICD-10-CM

## 2016-12-08 DIAGNOSIS — R262 Difficulty in walking, not elsewhere classified: Secondary | ICD-10-CM | POA: Diagnosis not present

## 2016-12-08 DIAGNOSIS — M25562 Pain in left knee: Secondary | ICD-10-CM

## 2016-12-08 DIAGNOSIS — R600 Localized edema: Secondary | ICD-10-CM | POA: Diagnosis not present

## 2016-12-08 NOTE — Therapy (Signed)
[place], [place], [address] Phone: [phone]   Fax:  [phone]  Physical Therapy Treatment  Patient Details  Name: Dana Williams MRN: [id number] Date of Birth: 01-01-1961 Referring Provider: Kathryne Hitch, MD  Encounter Date: 12/08/2016      PT End of Session - 12/08/16 1424    Visit Number 5   Number of Visits 16   Date for PT Re-Evaluation 01/17/17   PT Start Time [id number]   PT Stop Time 1435   PT Time Calculation (min) 50 min   Activity Tolerance Patient tolerated treatment well   Behavior During Therapy [facility] for tasks assessed/performed      Past Medical History:  Diagnosis Date  . Arthritis   . Depression   . GERD (gastroesophageal reflux disease)   . H/O psoriatic arthritis    DX  age 39---- in remission for 20 years  . History of hiatal hernia   . History of kidney stones   . Hyperlipidemia   . Personal history of colonic polyp - adenoma 01/29/2014  . Plantar fasciitis of left foot   . PONV (postoperative nausea and vomiting)   . Renal calculus, left   . Sigmoid diverticulosis   . Vitamin D deficiency     Past Surgical History:  Procedure Laterality Date  . CLOSED MANIPULATION SHOULDER    . COLONOSCOPY WITH PROPOFOL  01-25-2014  . CYSTO/  BILATERAL RETROGRADE PYELOGRAM/  BILATERAL URETEROSCOPIC LASER LITHOTRIPSY STONE EXTRACTIONS/  BILATERAL STENT PLACEMENT  02-13-2010  . CYSTOSCOPY WITH RETROGRADE PYELOGRAM, URETEROSCOPY AND STENT PLACEMENT Left 04/21/2015   Procedure: CYSTOSCOPY WITH RETROGRADE PYELOGRAM, URETEROSCOPY, STONE EXTRACTION AND STENT PLACEMENT;  Surgeon: Franchot Gallo, MD;  Location: [facility];  Service: Urology;  Laterality: Left;  . CYSTOURETHROSCOPY/  PLACEMENT LYNX SUPRAPUBIC SLING  05-24-2008  . HAND TENDON SURGERY Right 1993  . HOLMIUM LASER APPLICATION Left [id number]   Procedure: HOLMIUM LASER APPLICATION;  Surgeon: Franchot Gallo, MD;   Location: [facility];  Service: Urology;  Laterality: Left;  . LAPAROSCOPIC CHOLECYSTECTOMY  01-31-2002  . LEFT URETEROSCOPIC STONE EXTRACTION/  STENT PLACEMENT  10-06-2005  . RHINOPLASTY  2007  . STRABISMUS SURGERY Left age 62  . TOTAL KNEE ARTHROPLASTY Left 10/27/2016   Procedure: TOTAL KNEE ARTHROPLASTY;  Surgeon: [name], MD;  Location: [place];  Service: Orthopedics;  Laterality: Left;  . WISDOM TOOTH EXTRACTION  age 82    There were no vitals filed for this visit.      Subjective Assessment - 12/08/16 1350    Subjective Pain today, about a 3/10. Walked for about 40 min in her neighborhood and has to sit and rest after 15 min.  knee was swollen.  Having difficulty sleeping.     Currently in Pain? Yes   Pain Score 4    Pain Location Knee   Pain Orientation Left   Pain Descriptors / Indicators Sore   Pain Type Surgical pain   Pain Onset More than a month ago   Pain Frequency Intermittent            [facility] PT Assessment - 12/08/16 1357      AROM   Left Knee Extension 8   Left Knee Flexion 104  98 seated      PROM   Left Knee Extension 6   Left Knee Flexion 108  AAROM      Strength   Left Knee Flexion 5/5   Left Knee Extension  5/5                     [facility] Adult PT Treatment/Exercise - 12/08/16 1353      Knee/Hip Exercises: Stretches   Active Hamstring Stretch Left;3 reps   Knee: Self-Stretch to increase Flexion Left;5 reps   Knee: Self-Stretch Limitations measured in supine and seated      Knee/Hip Exercises: Aerobic   Stationary Bike level 1, 6 min      Knee/Hip Exercises: Machines for Strengthening   Cybex Leg Press 1 plate x 20 x 2 sets focus on equal pressure , used for single leg x 10 and heel raises x 20, calf stretch      Vasopneumatic   Number Minutes Vasopneumatic  15 minutes   Vasopnuematic Location  Knee   Vasopneumatic Pressure Medium   Vasopneumatic Temperature  32     Manual Therapy   Manual Therapy  Soft tissue mobilization   Soft tissue mobilization lateral quads, ITB                 PT Education - 12/08/16 1422    Education provided Yes   Education Details goals   Person(s) Educated Patient   Methods Explanation   Comprehension Verbalized understanding          PT Short Term Goals - 12/08/16 1409      PT SHORT TERM GOAL #1   Title She will be independent with inital HEP   Status Achieved     PT SHORT TERM GOAL #2   Title she will improve active LT knee extension to -12 degrees to improve gait pattern   Status Achieved     PT SHORT TERM GOAL #3   Title She will improve active LT knee flexion to 108 degrees to improve gait pattern   Baseline AAROM to 108    Status On-going     PT SHORT TERM GOAL #4   Title she will improve strength to 5/5 quads and hamstrings LT to improve gait   Status Achieved           PT Long Term Goals - 12/08/16 1409      PT LONG TERM GOAL #1   Title She will be independent with all HEP issued .    Status On-going     PT LONG TERM GOAL #2   Title She will walk staris step ovestep with one rail safely   Status On-going     PT LONG TERM GOAL #3   Title she will report able to be on feet for at least 4 hours to prep for return to work.    Baseline 1 hour   Status On-going     PT LONG TERM GOAL #4   Title she will be able to squat for picking up 20 pounds floor to waist.    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  She is only able to stand for 1 hour a time.  Notices increased stiffness after yesterday's long walk. AROM is 6 deg  ext to 104 deg flexion.  Strength is 5/5.  Have not been able to test deep squatting and pushing/pulling heavier weights due to only 6 weeks out.  We will work toward this in the coming weeks, integrating more functional activities to test job performance in a controlled environment.    PT Next Visit Plan progress PRE and AROM for knee, mobs   PT Home Exercise Plan Prone hang and supine knee flexion with hip flex 90 degrees for ROM  , level 1-2 knee    Consulted and Agree with Plan of Care Patient      Patient will benefit from skilled therapeutic intervention in order to improve the following deficits and impairments:  Decreased activity tolerance, Increased edema, Decreased strength, Pain, Decreased range of motion, Difficulty walking  Visit Diagnosis: Left knee pain, unspecified chronicity  Muscle weakness (generalized)  Difficulty in walking, not elsewhere classified  Localized edema     Problem List Patient Active Problem List   Diagnosis Date Noted  . Primary localized osteoarthritis of left knee 10/27/2016  . Personal history of colonic polyp - adenoma 01/29/2014  . Hyperlipidemia 04/18/2012    PAA,[name] 12/08/2016, 2:31 PM  [facility] [address] [place], [place], [address] Phone: [phone]   Fax:  [phone]  Name: Dana Williams MRN: [id number] Date of Birth: 05/09/1961   Raeford Razor, PT 12/08/16 2:32 PM Phone: [phone] Fax: [phone]

## 2016-12-10 DIAGNOSIS — M1712 Unilateral primary osteoarthritis, left knee: Secondary | ICD-10-CM | POA: Diagnosis not present

## 2016-12-13 ENCOUNTER — Ambulatory Visit: Payer: 59 | Admitting: Physical Therapy

## 2016-12-13 DIAGNOSIS — R262 Difficulty in walking, not elsewhere classified: Secondary | ICD-10-CM

## 2016-12-13 DIAGNOSIS — R600 Localized edema: Secondary | ICD-10-CM

## 2016-12-13 DIAGNOSIS — M25562 Pain in left knee: Secondary | ICD-10-CM | POA: Diagnosis not present

## 2016-12-13 DIAGNOSIS — M6281 Muscle weakness (generalized): Secondary | ICD-10-CM | POA: Diagnosis not present

## 2016-12-13 NOTE — Therapy (Signed)
[place], [place], [address] Phone: [phone]   Fax:  [phone]  Physical Therapy Treatment  Patient Details  Name: Dana Williams MRN: [id number] Date of Birth: 06-14-1961 Referring Provider: Kathryne Hitch, MD  Encounter Date: 12/13/2016      PT End of Session - 12/13/16 1159    Visit Number 6   Number of Visits 16   Date for PT Re-Evaluation 01/17/17   PT Start Time 1016   PT Stop Time 1115   PT Time Calculation (min) 59 min   Activity Tolerance Patient tolerated treatment well   Behavior During Therapy [facility] for tasks assessed/performed      Past Medical History:  Diagnosis Date  . Arthritis   . Depression   . GERD (gastroesophageal reflux disease)   . H/O psoriatic arthritis    DX  age 55---- in remission for 20 years  . History of hiatal hernia   . History of kidney stones   . Hyperlipidemia   . Personal history of colonic polyp - adenoma 01/29/2014  . Plantar fasciitis of left foot   . PONV (postoperative nausea and vomiting)   . Renal calculus, left   . Sigmoid diverticulosis   . Vitamin D deficiency     Past Surgical History:  Procedure Laterality Date  . CLOSED MANIPULATION SHOULDER    . COLONOSCOPY WITH PROPOFOL  01-25-2014  . CYSTO/  BILATERAL RETROGRADE PYELOGRAM/  BILATERAL URETEROSCOPIC LASER LITHOTRIPSY STONE EXTRACTIONS/  BILATERAL STENT PLACEMENT  02-13-2010  . CYSTOSCOPY WITH RETROGRADE PYELOGRAM, URETEROSCOPY AND STENT PLACEMENT Left 04/21/2015   Procedure: CYSTOSCOPY WITH RETROGRADE PYELOGRAM, URETEROSCOPY, STONE EXTRACTION AND STENT PLACEMENT;  Surgeon: Franchot Gallo, MD;  Location[facility];  Service: Urology;  Laterality: Left;  . CYSTOURETHROSCOPY/  PLACEMENT LYNX SUPRAPUBIC SLING  05-24-2008  . HAND TENDON SURGERY Right 1993  . HOLMIUM LASER APPLICATION Left [id number]   Procedure: HOLMIUM LASER APPLICATION; 56  Surgeon: Franchot Gallo, MD;   Location: [facility];  Service: Urology;  Laterality: Left;  . LAPAROSCOPIC CHOLECYSTECTOMY  01-31-2002  . LEFT URETEROSCOPIC STONE EXTRACTION/  STENT PLACEMENT  10-06-2005  . RHINOPLASTY  2007  . STRABISMUS SURGERY Left age 16  . TOTAL KNEE ARTHROPLASTY Left 10/27/2016   Procedure: TOTAL KNEE ARTHROPLASTY;  Surgeon: [name], MD;  Location: [place];  Service: Orthopedics;  Laterality: Left;  . WISDOM TOOTH EXTRACTION  age 56    There were no vitals filed for this visit.      Subjective Assessment - 12/13/16 1024    Subjective Was on my feet for 5 hours yesterday with daughter.  No pain just really stiff.    Currently in Pain? No/denies             [facility] Adult PT Treatment/Exercise - 12/13/16 1200      Knee/Hip Exercises: Stretches   Active Hamstring Stretch Left;3 reps   Knee: Self-Stretch to increase Flexion Left;5 reps     Knee/Hip Exercises: Aerobic   Nustep L7 UE and LE for 8 min      Knee/Hip Exercises: Standing   Lateral Step Up Left;1 set;20 reps;Hand Hold: 0   Lateral Step Up Limitations 8 inch no A from UE    Forward Step Up Left;1 set;20 reps;Step Height: 8"   Functional Squat 1 set;20 reps     Knee/Hip Exercises: Supine   Bridges with Diona Foley Squeeze Strengthening;Both;1 set;10 reps   Straight Leg Raises Strengthening;Left;1 set;10 reps  Straight Leg Raise with External Rotation Strengthening;Left;1 set;10 reps     Knee/Hip Exercises: Sidelying   Hip ABduction Strengthening;Both;1 set;15 reps     Vasopneumatic   Number Minutes Vasopneumatic  15 minutes   Vasopnuematic Location  Knee   Vasopneumatic Pressure Medium   Vasopneumatic Temperature  32     Manual Therapy   Joint Mobilization GR II flexion and ext                   PT Short Term Goals - 12/08/16 1409      PT SHORT TERM GOAL #1   Title She will be independent with inital HEP   Status Achieved     PT SHORT TERM GOAL #2   Title she will improve active LT  knee extension to -12 degrees to improve gait pattern   Status Achieved     PT SHORT TERM GOAL #3   Title She will improve active LT knee flexion to 108 degrees to improve gait pattern   Baseline AAROM to 108    Status On-going     PT SHORT TERM GOAL #4   Title she will improve strength to 5/5 quads and hamstrings LT to improve gait   Status Achieved           PT Long Term Goals - 12/08/16 1409      PT LONG TERM GOAL #1   Title She will be independent with all HEP issued .    Status On-going     PT LONG TERM GOAL #2   Title She will walk staris step ovestep with one rail safely   Status On-going     PT LONG TERM GOAL #3   Title she will report able to be on feet for at least 4 hours to prep for return to work.    Baseline 1 hour   Status On-going     PT LONG TERM GOAL #4   Title she will be able to squat for picking up 20 pounds floor to waist.    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  Increased frequency to 3 times per week to work towards full RTW in 6 weeks.  Streamlined HEP for efficiency. Working towards goals.    PT Frequency 3x / week   PT Next Visit Plan progress PRE and AROM for knee, mobs, do seated    PT Home Exercise Plan Prone hang and supine knee flexion with hip flex 90 degrees for ROM  , level 1-2 knee    Consulted and Agree with Plan of Care Patient      Patient will benefit from skilled therapeutic intervention in order to improve the following deficits and impairments:  Decreased activity tolerance, Increased edema, Decreased strength, Pain, Decreased range  of motion, Difficulty walking  Visit Diagnosis: Left knee pain, unspecified chronicity  Muscle weakness (generalized)  Difficulty in walking, not  elsewhere classified  Localized edema     Problem List Patient Active Problem List   Diagnosis Date Noted  . Primary localized osteoarthritis of left knee 10/27/2016  . Personal history of colonic polyp - adenoma 01/29/2014  . Hyperlipidemia 04/18/2012    PAA,[name] 12/13/2016, 12:49 PM  [facility] [address] [place], [place], [address] Phone: [phone]   Fax:  [phone]  Name: MICHEAL MATHWIG MRN: [id number] Date of Birth: 12/20/1960  Raeford Razor, PT 12/13/16 12:50 PM Phone: [phone] Fax: [phone]

## 2016-12-14 ENCOUNTER — Ambulatory Visit: Payer: 59 | Admitting: Physical Therapy

## 2016-12-14 DIAGNOSIS — R262 Difficulty in walking, not elsewhere classified: Secondary | ICD-10-CM

## 2016-12-14 DIAGNOSIS — M6281 Muscle weakness (generalized): Secondary | ICD-10-CM | POA: Diagnosis not present

## 2016-12-14 DIAGNOSIS — M25562 Pain in left knee: Secondary | ICD-10-CM | POA: Diagnosis not present

## 2016-12-14 DIAGNOSIS — R600 Localized edema: Secondary | ICD-10-CM | POA: Diagnosis not present

## 2016-12-14 NOTE — Therapy (Signed)
[place], [place], [address] Phone: [phone]   Fax:  [phone]  Physical Therapy Treatment  Patient Details  Name: Dana Williams MRN: [id number] Date of Birth: 1961/06/16 Referring Provider: Kathryne Hitch, MD  Encounter Date: 12/14/2016      PT End of Session - 12/14/16 1507    Visit Number 7   Number of Visits 16   Date for PT Re-Evaluation 01/17/17   PT Start Time [id number]   PT Stop Time 1517   PT Time Calculation (min) 57 min   Activity Tolerance Patient tolerated treatment well   Behavior During Therapy [facility] for tasks assessed/performed      Past Medical History:  Diagnosis Date  . Arthritis   . Depression   . GERD (gastroesophageal reflux disease)   . H/O psoriatic arthritis    DX  age 80---- in remission for 20 years  . History of hiatal hernia   . History of kidney stones   . Hyperlipidemia   . Personal history of colonic polyp - adenoma 01/29/2014  . Plantar fasciitis of left foot   . PONV (postoperative nausea and vomiting)   . Renal calculus, left   . Sigmoid diverticulosis   . Vitamin D deficiency     Past Surgical History:  Procedure Laterality Date  . CLOSED MANIPULATION SHOULDER    . COLONOSCOPY WITH PROPOFOL  01-25-2014  . CYSTO/  BILATERAL RETROGRADE PYELOGRAM/  BILATERAL URETEROSCOPIC LASER LITHOTRIPSY STONE EXTRACTIONS/  BILATERAL STENT PLACEMENT  02-13-2010  . CYSTOSCOPY WITH RETROGRADE PYELOGRAM, URETEROSCOPY AND STENT PLACEMENT Left 04/21/2015   Procedure: CYSTOSCOPY WITH RETROGRADE PYELOGRAM, URETEROSCOPY, STONE EXTRACTION AND STENT PLACEMENT;  Surgeon: Franchot Gallo, MD;  Location: [facility];  Service: Urology;  Laterality: Left;  . CYSTOURETHROSCOPY/  PLACEMENT LYNX SUPRAPUBIC SLING  05-24-2008  . HAND TENDON SURGERY Right 1993  . HOLMIUM LASER APPLICATION Left [id number]   Procedure: HOLMIUM LASER APPLICATION;  Surgeon: Franchot Gallo, MD;   Location: [facility];  Service: Urology;  Laterality: Left;  . LAPAROSCOPIC CHOLECYSTECTOMY  01-31-2002  . LEFT URETEROSCOPIC STONE EXTRACTION/  STENT PLACEMENT  10-06-2005  . RHINOPLASTY  2007  . STRABISMUS SURGERY Left age 38  . TOTAL KNEE ARTHROPLASTY Left 10/27/2016   Procedure: TOTAL KNEE ARTHROPLASTY;  Surgeon: [name], MD;  Location: [place];  Service: Orthopedics;  Laterality: Left;  . WISDOM TOOTH EXTRACTION  age 13    There were no vitals filed for this visit.      Subjective Assessment - 12/14/16 1422    Subjective My knee was hurting last night.  Its a little sore today.    Currently in Pain? Yes   Pain Score 2    Pain Location Knee   Pain Orientation Left   Pain Descriptors / Indicators Sore   Pain Type Surgical pain   Pain Onset More than a month ago   Pain Frequency Intermittent   Aggravating Factors  overdoing it    Pain Relieving Factors RICE               [facility] Adult PT Treatment/Exercise - 12/14/16 1432      Knee/Hip Exercises: Stretches   Knee: Self-Stretch to increase Flexion Left;5 reps     Knee/Hip Exercises: Aerobic   Stationary Bike level 1 , 5 min      Knee/Hip Exercises: Machines for Strengthening   Cybex Leg Press 25 lbs seated OMEGA x 20 reps   1 set  x 10 LLE only      Knee/Hip Exercises: Standing   Forward Lunges Left;1 set;10 reps   Forward Lunges Limitations hold 10 sec , need 1 UE assist    Functional Squat 1 set;15 reps   Wall Squat 1 set;20 reps   Wall Squat Limitations ball on the wall    Rocker Board 5 minutes   Rocker Board Limitations M/L and A/P    Other Standing Knee Exercises slant board stretch bilat.  x 3 x 30 sec      Vasopneumatic   Number Minutes Vasopneumatic  15 minutes   Vasopnuematic Location  Knee   Vasopneumatic Pressure Medium   Vasopneumatic Temperature  32     Manual Therapy   Manual therapy comments contract-relax for knee flexion to 108 deg AAROM    Joint Mobilization GR II  flexion and ext                 PT Education - 12/14/16 1506    Education provided Yes   Education Details Community resources for continued fitness    Person(s) Educated Patient   Methods Explanation   Comprehension Verbalized understanding;Returned demonstration;Need further instruction          PT Short Term Goals - 12/08/16 1409      PT SHORT TERM GOAL #1   Title She will be independent with inital HEP   Status Achieved     PT SHORT TERM GOAL #2   Title she will improve active LT knee extension to -12 degrees to improve gait pattern   Status Achieved     PT SHORT TERM GOAL #3   Title She will improve active LT knee flexion to 108 degrees to improve gait pattern   Baseline AAROM to 108    Status On-going     PT SHORT TERM GOAL #4   Title she will improve strength to 5/5 quads and hamstrings LT to improve gait   Status Achieved           PT Long Term Goals - 12/08/16 1409      PT LONG TERM GOAL #1   Title She will be independent with all HEP issued .    Status On-going     PT LONG TERM GOAL #2   Title She will walk staris step ovestep with one rail safely   Status On-going     PT LONG TERM GOAL #3   Title she will report able to be on feet for at least 4 hours to prep for return to work.    Baseline 1 hour   Status On-going     PT LONG TERM GOAL #4   Title she will be able to squat for picking up 20 pounds floor to waist.    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  Pain wakes her at night. Audible clicking  lateral knee with gait,  no pain associated.  May join a gym for continued activity and social outlet.    PT Next Visit Plan Pilates Reformer    PT Home Exercise Plan Prone hang and supine knee flexion with hip flex 90 degrees for ROM  , level 1-2 knee    Consulted and Agree with Plan of Care Patient      Patient will benefit from skilled therapeutic intervention in order to improve the following deficits and impairments:  Decreased activity tolerance, Increased edema, Decreased strength, Pain, Decreased range of motion, Difficulty walking  Visit Diagnosis: Left knee pain, unspecified chronicity  Muscle weakness (generalized)  Difficulty in walking, not elsewhere classified  Localized edema     Problem List Patient Active Problem List   Diagnosis Date Noted  . Primary localized osteoarthritis of left knee 10/27/2016  . Personal history of colonic polyp - adenoma 01/29/2014  . Hyperlipidemia 04/18/2012    PAA,[name] 12/14/2016, 3:16 PM  [facility] [address] [place], [place], [address] Phone: [phone]   Fax:  [phone]  Name: ARITHA MOSTYN MRN: [id number] Date of Birth: 1961-10-17  Raeford Razor, PT 12/14/16 3:16 PM Phone: [phone] Fax: [phone]

## 2016-12-15 ENCOUNTER — Ambulatory Visit: Payer: 59 | Admitting: Physical Therapy

## 2016-12-15 DIAGNOSIS — M6281 Muscle weakness (generalized): Secondary | ICD-10-CM | POA: Diagnosis not present

## 2016-12-15 DIAGNOSIS — M25562 Pain in left knee: Secondary | ICD-10-CM | POA: Diagnosis not present

## 2016-12-15 DIAGNOSIS — R262 Difficulty in walking, not elsewhere classified: Secondary | ICD-10-CM | POA: Diagnosis not present

## 2016-12-15 DIAGNOSIS — R600 Localized edema: Secondary | ICD-10-CM

## 2016-12-15 NOTE — Therapy (Signed)
[place], [place], [address] Phone: [phone]   Fax:  [phone]  Physical Therapy Treatment  Patient Details  Name: LEVAEH VICE MRN: [id number] Date of Birth: 12/30/1960 Referring Provider: Kathryne Hitch, MD  Encounter Date: 12/15/2016      PT End of Session - 12/15/16 1406    Visit Number 8   Number of Visits 16   Date for PT Re-Evaluation 01/17/17   PT Start Time 3710   PT Stop Time 1450   PT Time Calculation (min) 62 min   Activity Tolerance Patient tolerated treatment well   Behavior During Therapy [facility] for tasks assessed/performed      Past Medical History:  Diagnosis Date  . Arthritis   . Depression   . GERD (gastroesophageal reflux disease)   . H/O psoriatic arthritis    DX  age 23---- in remission for 20 years  . History of hiatal hernia   . History of kidney stones   . Hyperlipidemia   . Personal history of colonic polyp - adenoma 01/29/2014  . Plantar fasciitis of left foot   . PONV (postoperative nausea and vomiting)   . Renal calculus, left   . Sigmoid diverticulosis   . Vitamin D deficiency     Past Surgical History:  Procedure Laterality Date  . CLOSED MANIPULATION SHOULDER    . COLONOSCOPY WITH PROPOFOL  01-25-2014  . CYSTO/  BILATERAL RETROGRADE PYELOGRAM/  BILATERAL URETEROSCOPIC LASER LITHOTRIPSY STONE EXTRACTIONS/  BILATERAL STENT PLACEMENT  02-13-2010  . CYSTOSCOPY WITH RETROGRADE PYELOGRAM, URETEROSCOPY AND STENT PLACEMENT Left 04/21/2015   Procedure: CYSTOSCOPY WITH RETROGRADE PYELOGRAM, URETEROSCOPY, STONE EXTRACTION AND STENT PLACEMENT;  Surgeon: Franchot Gallo, MD;  Location: [facility];  Service: Urology;  Laterality: Left;  . CYSTOURETHROSCOPY/  PLACEMENT LYNX SUPRAPUBIC SLING  05-24-2008  . HAND TENDON SURGERY Right 1993  . HOLMIUM LASER APPLICATION Left 04/17/6947   Procedure: HOLMIUM LASER APPLICATION;  Surgeon: Franchot Gallo, MD;   Location: [facility];  Service: Urology;  Laterality: Left;  . LAPAROSCOPIC CHOLECYSTECTOMY  01-31-2002  . LEFT URETEROSCOPIC STONE EXTRACTION/  STENT PLACEMENT  10-06-2005  . RHINOPLASTY  2007  . STRABISMUS SURGERY Left age 53  . TOTAL KNEE ARTHROPLASTY Left 10/27/2016   Procedure: TOTAL KNEE ARTHROPLASTY;  Surgeon: [name], MD;  Location: [place];  Service: Orthopedics;  Laterality: Left;  . WISDOM TOOTH EXTRACTION  age 74    There were no vitals filed for this visit.      Subjective Assessment - 12/15/16 1358    Subjective Im a little sore.  I looked into Pilates last night.  Could i do that?   Currently in Pain? Yes   Pain Score 2    Pain Location Knee   Pain Orientation Left           [facility] Adult PT Treatment/Exercise - 12/15/16 1401      Knee/Hip Exercises: Machines for Strengthening   Other Machine Pilates Reformer see note      Vasopneumatic   Number Minutes Vasopneumatic  15 minutes   Vasopnuematic Location  Knee   Vasopneumatic Pressure Medium   Vasopneumatic Temperature  32      Pilates Reformer used for LE/core strength, postural strength, lumbopelvic disassociation and core control.  Exercises included: Footwork 2 red 1 blue bilateral heels, arch and forefoot.  Added heel stretch for post knee Pt needs max cueing for breathing coordination but has good LE algnment and strength  Bridging 2 red 1 blue x 10 articulating, mod cues Single leg x 5 each side all springs on LLE Feet in straps LLE hamstring straps  Arcs   1 Red Circles 1 red spring   Squats (wide knees ) 1 red         PT Education - 12/15/16 1406    Education provided Yes   Education Details Pilates    Person(s) Educated Patient   Methods Explanation;Tactile cues;Demonstration;Verbal cues   Comprehension Verbalized understanding;Need further instruction          PT Short Term Goals - 12/08/16 1409      PT SHORT TERM GOAL #1   Title She will be independent with  inital HEP   Status Achieved     PT SHORT TERM GOAL #2   Title she will improve active LT knee extension to -12 degrees to improve gait pattern   Status Achieved     PT SHORT TERM GOAL #3   Title She will improve active LT knee flexion to 108 degrees to improve gait pattern   Baseline AAROM to 108    Status On-going     PT SHORT TERM GOAL #4   Title she will improve strength to 5/5 quads and hamstrings LT to improve gait   Status Achieved           PT Long Term Goals - 12/15/16 1411      PT LONG TERM GOAL #1   Title She will be independent with all HEP issued .    Status On-going     PT LONG TERM GOAL #2   Title She will walk staris step ovestep with one rail safely   Status Partially Met     PT LONG TERM GOAL #3   Title she will report able to be on feet for at least 4 hours to prep for return to work.    Status On-going     PT LONG TERM GOAL #4   Title she will be able to squat for picking up 20 pounds floor to waist.    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  Did well with Pilates reformer, demonstrated good strength in LEs, lacks core control, very stiff through thoracolumbar spine.    PT Next Visit Plan seated mobs, ROM and PRE as tol    PT Home Exercise Plan Prone hang and supine knee flexion with hip flex 90 degrees for ROM  , level 1-2 knee    Consulted and Agree with Plan of Care Patient      Patient will benefit from skilled therapeutic intervention in order to improve the following deficits and impairments:  Decreased  activity tolerance, Increased edema, Decreased strength, Pain, Decreased range of motion, Difficulty walking  Visit Diagnosis: Left knee pain, unspecified chronicity  Muscle weakness (generalized)  Difficulty in walking, not elsewhere classified  Localized edema  Problem List Patient Active Problem List   Diagnosis Date Noted  . Primary localized osteoarthritis of left knee 10/27/2016  . Personal history of colonic polyp - adenoma 01/29/2014  . Hyperlipidemia 04/18/2012    PAA,[name] 12/15/2016, 3:44 PM  [facility] [address] [place], [place], [address] Phone: [phone]   Fax:  [phone]  Name: MILAYAH KRELL MRN: [id number] Date of Birth: 02/27/1961  Raeford Razor, PT 12/15/16 3:44 PM Phone: [phone] Fax: [phone]

## 2016-12-20 ENCOUNTER — Ambulatory Visit: Payer: 59 | Attending: Orthopedic Surgery | Admitting: Physical Therapy

## 2016-12-20 DIAGNOSIS — R600 Localized edema: Secondary | ICD-10-CM | POA: Diagnosis present

## 2016-12-20 DIAGNOSIS — R262 Difficulty in walking, not elsewhere classified: Secondary | ICD-10-CM | POA: Diagnosis present

## 2016-12-20 DIAGNOSIS — M25562 Pain in left knee: Secondary | ICD-10-CM

## 2016-12-20 DIAGNOSIS — M6281 Muscle weakness (generalized): Secondary | ICD-10-CM | POA: Diagnosis present

## 2016-12-20 NOTE — Therapy (Signed)
[place], [place], [address] Phone: [phone]   Fax:  [phone]  Physical Therapy Treatment  Patient Details  Name: Dana Williams MRN: [id number] Date of Birth: 1961-05-06 Referring Provider: Kathryne Hitch, MD  Encounter Date: 12/20/2016      PT End of Session - 12/20/16 1114    Visit Number 9   Number of Visits 16   Date for PT Re-Evaluation 01/17/17   PT Start Time 1100   PT Stop Time 1200   PT Time Calculation (min) 60 min   Activity Tolerance Patient tolerated treatment well   Behavior During Therapy [facility] for tasks assessed/performed      Past Medical History:  Diagnosis Date  . Arthritis   . Depression   . GERD (gastroesophageal reflux disease)   . H/O psoriatic arthritis    DX  age 37---- in remission for 20 years  . History of hiatal hernia   . History of kidney stones   . Hyperlipidemia   . Personal history of colonic polyp - adenoma 01/29/2014  . Plantar fasciitis of left foot   . PONV (postoperative nausea and vomiting)   . Renal calculus, left   . Sigmoid diverticulosis   . Vitamin D deficiency     Past Surgical History:  Procedure Laterality Date  . CLOSED MANIPULATION SHOULDER    . COLONOSCOPY WITH PROPOFOL  01-25-2014  . CYSTO/  BILATERAL RETROGRADE PYELOGRAM/  BILATERAL URETEROSCOPIC LASER LITHOTRIPSY STONE EXTRACTIONS/  BILATERAL STENT PLACEMENT  02-13-2010  . CYSTOSCOPY WITH RETROGRADE PYELOGRAM, URETEROSCOPY AND STENT PLACEMENT Left 04/21/2015   Procedure: CYSTOSCOPY WITH RETROGRADE PYELOGRAM, URETEROSCOPY, STONE EXTRACTION AND STENT PLACEMENT;  Surgeon: Franchot Gallo, MD;  Location: [place];  Service: Urology;  Laterality: Left;  . CYSTOURETHROSCOPY/  PLACEMENT LYNX SUPRAPUBIC SLING  05-24-2008  . HAND TENDON SURGERY Right 1993  . HOLMIUM LASER APPLICATION Left 0/11/270   Procedure: HOLMIUM LASER APPLICATION;  Surgeon: Franchot Gallo, MD;   Location: [facility];  Service: Urology;  Laterality: Left;  . LAPAROSCOPIC CHOLECYSTECTOMY  01-31-2002  . LEFT URETEROSCOPIC STONE EXTRACTION/  STENT PLACEMENT  10-06-2005  . RHINOPLASTY  2007  . STRABISMUS SURGERY Left age 28  . TOTAL KNEE ARTHROPLASTY Left 10/27/2016   Procedure: TOTAL KNEE ARTHROPLASTY;  Surgeon: [name], MD;  Location: [place];  Service: Orthopedics;  Laterality: Left;  . WISDOM TOOTH EXTRACTION  age 59    There were no vitals filed for this visit.      Subjective Assessment - 12/20/16 1104    Subjective Was in [place] this weekend, didnt really do my exercises but I was shopping all day Saturday (10-5) .  Was tight, took a pain pill at night.    Currently in Pain? No/denies            [facility] PT Assessment - 12/20/16 1113      AROM   Left Knee Flexion 100     PROM   Left Knee Flexion 108           [facility] Adult PT Treatment/Exercise - 12/20/16 1114      Knee/Hip Exercises: Stretches   Active Hamstring Stretch Left;3 reps   Knee: Self-Stretch to increase Flexion Left;5 reps     Knee/Hip Exercises: Aerobic   Stationary Bike level 1 for 6 min      Knee/Hip Exercises: Standing   Forward Lunges Left;1 set;10 reps   Forward Lunges Limitations hold 10 sec ,  need 1 UE assist    Forward Step Up Left;1 set   Forward Step Up Limitations for AAROM    Step Down Left;1 set;20 reps;Hand Hold: 1;Step Height: 4"   Step Down Limitations no pain    Functional Squat 2 sets;15 reps   Functional Squat Limitations no UE support    SLS with Vectors semi- circles on SLS x 10   bilat.      Knee/Hip Exercises: Prone   Other Prone Exercises semi prone for hip ext and glute press x 10 each LE      Vasopneumatic   Number Minutes Vasopneumatic  15 minutes   Vasopnuematic Location  Knee   Vasopneumatic Pressure Medium   Vasopneumatic Temperature  32     Manual Therapy   Joint Mobilization GR II flexion and ext    Soft tissue mobilization  PROM supine for flex and ext                   PT Short Term Goals - 12/08/16 1409      PT SHORT TERM GOAL #1   Title She will be independent with inital HEP   Status Achieved     PT SHORT TERM GOAL #2   Title she will improve active LT knee extension to -12 degrees to improve gait pattern   Status Achieved     PT SHORT TERM GOAL #3   Title She will improve active LT knee flexion to 108 degrees to improve gait pattern   Baseline AAROM to 108    Status On-going     PT SHORT TERM GOAL #4   Title she will improve strength to 5/5 quads and hamstrings LT to improve gait   Status Achieved           PT Long Term Goals - 12/15/16 1411      PT LONG TERM GOAL #1   Title She will be independent with all HEP issued .    Status On-going     PT LONG TERM GOAL #2   Title She will walk staris step ovestep with one rail safely   Status Partially Met     PT LONG TERM GOAL #3   Title she will report able to be on feet for at least 4 hours to prep for return to work.    Status On-going     PT LONG TERM GOAL #4   Title she will be able to squat for picking up 20 pounds floor to waist.    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  Showed her a image of TKR to improve her understanding of her procedure.Progressing towards goals.      PT Next Visit Plan add resistance bands for hip work , cont to progress ROM and functional strength , gait on TM uphill    PT Home  Exercise Plan Prone hang and supine knee flexion with hip flex 90  degrees for ROM  , level 1-2 knee    Consulted and Agree with Plan of Care Patient      Patient will benefit from skilled therapeutic intervention in order to improve the following deficits and impairments:  Decreased activity tolerance, Increased edema, Decreased strength, Pain, Decreased range of motion, Difficulty walking  Visit Diagnosis: Left knee pain, unspecified chronicity  Muscle weakness (generalized)  Difficulty in walking, not elsewhere classified  Localized edema     Problem List Patient Active Problem List   Diagnosis Date Noted  . Primary localized osteoarthritis of left knee 10/27/2016  . Personal history of colonic polyp - adenoma 01/29/2014  . Hyperlipidemia 04/18/2012    Dana Williams,Dana Williams 12/20/2016, 12:55 PM  [facility] [address] [place], [place], [address] Phone: [phone]   Fax:  [phone]  Name: Dana Williams MRN: [id number] Date of Birth: 1961-08-13  Raeford Razor, PT 12/20/16 12:55 PM Phone: [phone] Fax: [phone]

## 2016-12-23 ENCOUNTER — Ambulatory Visit: Payer: 59 | Admitting: Physical Therapy

## 2016-12-24 ENCOUNTER — Ambulatory Visit: Payer: 59 | Admitting: Physical Therapy

## 2016-12-24 DIAGNOSIS — R262 Difficulty in walking, not elsewhere classified: Secondary | ICD-10-CM | POA: Diagnosis not present

## 2016-12-24 DIAGNOSIS — M25562 Pain in left knee: Secondary | ICD-10-CM

## 2016-12-24 DIAGNOSIS — M6281 Muscle weakness (generalized): Secondary | ICD-10-CM

## 2016-12-24 DIAGNOSIS — R600 Localized edema: Secondary | ICD-10-CM | POA: Diagnosis not present

## 2016-12-24 MED FILL — ESOMEPRAZOLE MAG DR 40 MG C: 40 | 90 days supply | Qty: 90 | Fill #0

## 2016-12-24 MED FILL — AMMONIUM LACTATE 12% LOTION: 12 | 30 days supply | Qty: 800 | Fill #2

## 2016-12-24 MED FILL — NORETHIN-ETH ESTRAD 1 MG-5: 1-5 | 84 days supply | Qty: 84 | Fill #4

## 2016-12-24 MED FILL — CELECOXIB 200 MG CAP: 200 | 30 days supply | Qty: 60 | Fill #1

## 2016-12-24 NOTE — Therapy (Signed)
[place], [place], [address] Phone: [phone]   Fax:  [phone]  Physical Therapy Treatment  Patient Details  Name: Dana Williams MRN: [id number] Date of Birth: Jul 06, 1961 Referring Provider: Kathryne Hitch, MD  Encounter Date: 12/24/2016      PT End of Session - 12/24/16 1144    Visit Number 10   Number of Visits 16   Date for PT Re-Evaluation 01/17/17   PT Start Time 1104   PT Stop Time 1158   PT Time Calculation (min) 54 min   Activity Tolerance Patient tolerated treatment well   Behavior During Therapy [facility] for tasks assessed/performed      Past Medical History:  Diagnosis Date  . Arthritis   . Depression   . GERD (gastroesophageal reflux disease)   . H/O psoriatic arthritis    DX  age 62---- in remission for 20 years  . History of hiatal hernia   . History of kidney stones   . Hyperlipidemia   . Personal history of colonic polyp - adenoma 01/29/2014  . Plantar fasciitis of left foot   . PONV (postoperative nausea and vomiting)   . Renal calculus, left   . Sigmoid diverticulosis   . Vitamin D deficiency     Past Surgical History:  Procedure Laterality Date  . CLOSED MANIPULATION SHOULDER    . COLONOSCOPY WITH PROPOFOL  01-25-2014  . CYSTO/  BILATERAL RETROGRADE PYELOGRAM/  BILATERAL URETEROSCOPIC LASER LITHOTRIPSY STONE EXTRACTIONS/  BILATERAL STENT PLACEMENT  02-13-2010  . CYSTOSCOPY WITH RETROGRADE PYELOGRAM, URETEROSCOPY AND STENT PLACEMENT Left 04/21/2015   Procedure: CYSTOSCOPY WITH RETROGRADE PYELOGRAM, URETEROSCOPY, STONE EXTRACTION AND STENT PLACEMENT;  Surgeon: Franchot Gallo, MD;  Location: [facility];  Service: Urology;  Laterality: Left;  . CYSTOURETHROSCOPY/  PLACEMENT LYNX SUPRAPUBIC SLING  05-24-2008  . HAND TENDON SURGERY Right 1993  . HOLMIUM LASER APPLICATION Left 07/18/8100   Procedure: HOLMIUM LASER APPLICATION;  Surgeon: Franchot Gallo, MD;   Location: [facility];  Service: Urology;  Laterality: Left;  . LAPAROSCOPIC CHOLECYSTECTOMY  01-31-2002  . LEFT URETEROSCOPIC STONE EXTRACTION/  STENT PLACEMENT  10-06-2005  . RHINOPLASTY  2007  . STRABISMUS SURGERY Left age 80  . TOTAL KNEE ARTHROPLASTY Left 10/27/2016   Procedure: TOTAL KNEE ARTHROPLASTY;  Surgeon: [name], MD;  Location: [place];  Service: Orthopedics;  Laterality: Left;  . WISDOM TOOTH EXTRACTION  age 63    There were no vitals filed for this visit.      Subjective Assessment - 12/24/16 1108    Subjective 2/10-3/10 pain L knee. Stiff.  Had some stabbing pains last night. DIdnt feel well the past 2 days, cancelled    Currently in Pain? Yes   Pain Score 3    Pain Location Knee   Pain Orientation Left   Pain Type Surgical pain   Pain Onset More than a month ago   Pain Frequency Intermittent   Aggravating Factors  sometimes randomly   Pain Relieving Factors Rest, RICE    Multiple Pain Sites No            [facility] PT Assessment - 12/24/16 1150      Observation/Other Assessments   Focus on Therapeutic Outcomes (FOTO)  38%                     [facility] Adult PT Treatment/Exercise - 12/24/16 1114      Knee/Hip Exercises: Stretches   Knee: Self-Stretch  to increase Flexion Left;3 reps;30 seconds   Knee: Self-Stretch Limitations prone and seated with belt      Knee/Hip Exercises: Aerobic   Stationary Bike level 1 for 6 min      Knee/Hip Exercises: Machines for Strengthening   Cybex Leg Press 2 plates x 20      Knee/Hip Exercises: Prone   Hamstring Curl 1 set;20 reps   Hamstring Curl Limitations 5 lbs   Hip Extension Strengthening;Left;1 set;10 reps   Other Prone Exercises prone quad set      Vasopneumatic   Number Minutes Vasopneumatic  15 minutes   Vasopnuematic Location  Knee   Vasopneumatic Pressure Medium   Vasopneumatic Temperature  32     Manual Therapy   Manual therapy comments contract-relax for knee flexion  to 108 deg AAROM    Joint Mobilization GR II flexion mobs   Soft tissue mobilization flexion, extension to 114 deg with PROM                 PT Education - 12/24/16 1143    Education provided Yes   Education Details AROM and progress    Person(s) Educated Patient   Methods Explanation   Comprehension Verbalized understanding          PT Short Term Goals - 12/24/16 1153      PT SHORT TERM GOAL #1   Title She will be independent with inital HEP   Status Achieved     PT SHORT TERM GOAL #2   Title she will improve active LT knee extension to -12 degrees to improve gait pattern   Status Achieved     PT SHORT TERM GOAL #3   Title She will improve active LT knee flexion to 108 degrees to improve gait pattern   Baseline AROM to 106 sitting   Status On-going     PT SHORT TERM GOAL #4   Title she will improve strength to 5/5 quads and hamstrings LT to improve gait   Status Achieved           PT Long Term Goals - 12/24/16 1154      PT LONG TERM GOAL #1   Title She will be independent with all HEP issued .    Status On-going     PT LONG TERM GOAL #2   Title She will walk staris step ovestep with one rail safely   Status Partially Met     PT LONG TERM GOAL #3   Title she will report able to be on feet for at least 4 hours to prep for return to work.    Status Unable to assess     PT LONG TERM GOAL #4   Title she will be able to squat for picking up 20 pounds floor to waist.    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  Trying to stay active but it is hard when she doesnt have much  to do!  Plans to joint the gym.  PROm in knee flexion to 114 deg. FOTO improved to 38% limited.    PT Next Visit Plan add resistance bands for hip work , cont to progress ROM and functional strength , gait on TM uphill    PT Home Exercise Plan Prone hang and supine knee flexion with hip flex 90 degrees for ROM  , level 1-2 knee    Consulted and Agree with Plan of Care Patient      Patient will benefit from skilled therapeutic intervention in order to improve the following deficits and impairments:  Decreased activity tolerance, Increased edema, Decreased strength, Pain, Decreased range of motion, Difficulty walking  Visit Diagnosis: Left knee pain, unspecified chronicity  Muscle weakness (generalized)  Difficulty in walking, not elsewhere classified  Localized edema     Problem List Patient Active Problem List   Diagnosis Date Noted  . Primary localized osteoarthritis of left knee 10/27/2016  . Personal history of colonic polyp - adenoma 01/29/2014  . Hyperlipidemia 04/18/2012    PAA,[name] 12/24/2016, 12:00 PM  [facility] [address] [place], [place], [address] Phone: [phone]   Fax:  [phone]  Name: Dana Williams MRN: [id number] Date of Birth: [birth year]   Raeford Razor, PT 12/24/16 12:01 PM Phone: [phone] Fax: [phone]

## 2016-12-27 ENCOUNTER — Telehealth: Payer: Self-pay | Admitting: Physical Therapy

## 2016-12-27 ENCOUNTER — Ambulatory Visit: Payer: 59 | Admitting: Physical Therapy

## 2016-12-27 NOTE — Telephone Encounter (Signed)
Called patient regarding her missed appt this AM at 11:00.  She has not missed an appt before.  Left message on voicemail and reminded her of her next appt. Wed.  12/29/16.

## 2016-12-29 ENCOUNTER — Ambulatory Visit: Payer: 59 | Admitting: Physical Therapy

## 2016-12-29 DIAGNOSIS — M25562 Pain in left knee: Secondary | ICD-10-CM

## 2016-12-29 DIAGNOSIS — R600 Localized edema: Secondary | ICD-10-CM

## 2016-12-29 DIAGNOSIS — R262 Difficulty in walking, not elsewhere classified: Secondary | ICD-10-CM | POA: Diagnosis not present

## 2016-12-29 DIAGNOSIS — M6281 Muscle weakness (generalized): Secondary | ICD-10-CM | POA: Diagnosis not present

## 2016-12-29 NOTE — Therapy (Signed)
[place], [place], [address] Phone: [phone]   Fax:  [phone]  Physical Therapy Treatment  Patient Details  Name: Dana Williams MRN: [id number] Date of Birth: 1961/08/03 Referring Provider: Kathryne Hitch, MD  Encounter Date: 12/29/2016      PT End of Session - 12/29/16 1347    Visit Number 11   Date for PT Re-Evaluation 01/17/17   PT Start Time 1300   PT Stop Time 1400   PT Time Calculation (min) 60 min   Activity Tolerance Patient tolerated treatment well   Behavior During Therapy [facility] for tasks assessed/performed      Past Medical History:  Diagnosis Date  . Arthritis   . Depression   . GERD (gastroesophageal reflux disease)   . H/O psoriatic arthritis    DX  age 61---- in remission for 20 years  . History of hiatal hernia   . History of kidney stones   . Hyperlipidemia   . Personal history of colonic polyp - adenoma 01/29/2014  . Plantar fasciitis of left foot   . PONV (postoperative nausea and vomiting)   . Renal calculus, left   . Sigmoid diverticulosis   . Vitamin D deficiency     Past Surgical History:  Procedure Laterality Date  . CLOSED MANIPULATION SHOULDER    . COLONOSCOPY WITH PROPOFOL  01-25-2014  . CYSTO/  BILATERAL RETROGRADE PYELOGRAM/  BILATERAL URETEROSCOPIC LASER LITHOTRIPSY STONE EXTRACTIONS/  BILATERAL STENT PLACEMENT  02-13-2010  . CYSTOSCOPY WITH RETROGRADE PYELOGRAM, URETEROSCOPY AND STENT PLACEMENT Left 04/21/2015   Procedure: CYSTOSCOPY WITH RETROGRADE PYELOGRAM, URETEROSCOPY, STONE EXTRACTION AND STENT PLACEMENT;  Surgeon: Franchot Gallo, MD;  Location: [facility];  Service: Urology;  Laterality: Left;  . CYSTOURETHROSCOPY/  PLACEMENT LYNX SUPRAPUBIC SLING  05-24-2008  . HAND TENDON SURGERY Right 1993  . HOLMIUM LASER APPLICATION Left 7/0/9628   Procedure: HOLMIUM LASER APPLICATION;  Surgeon: Franchot Gallo, MD;  Location: [facility];  Service: Urology;  Laterality: Left;  . LAPAROSCOPIC CHOLECYSTECTOMY  01-31-2002  . LEFT URETEROSCOPIC STONE EXTRACTION/  STENT PLACEMENT  10-06-2005  . RHINOPLASTY  2007  . STRABISMUS SURGERY Left age 17  . TOTAL KNEE ARTHROPLASTY Left 10/27/2016   Procedure: TOTAL KNEE ARTHROPLASTY;  Surgeon: [name], MD;  Location: [place];  Service: Orthopedics;  Laterality: Left;  . WISDOM TOOTH EXTRACTION  age 81    There were no vitals filed for this visit.      Subjective Assessment - 12/29/16 1307    Subjective Missed her last appt as her family member passed.  Got my strap to help stretching.              [place] Adult PT Treatment/Exercise - 12/29/16 1322      Knee/Hip Exercises: Aerobic   Stationary Bike level 1, 6 min reciumbant bike, discussed subjective      Knee/Hip Exercises: Supine   Straight Leg Raises Strengthening;Left;1 set;10 reps   Straight Leg Raises Limitations 4 lbs    Straight Leg Raise with External Rotation Strengthening;Left;1 set;10 reps   Straight Leg Raise with External Rotation Limitations 4 lbs    Patellar Mobs by PT, gentle    Knee Extension AAROM;Left     Knee/Hip Exercises: Sidelying   Hip ABduction Strengthening;Left;1 set;10 reps   Clams x 20 blue band      Knee/Hip Exercises: Prone   Hamstring Curl 1 set;20 reps   Hamstring Curl Limitations 5   Hip  Extension Strengthening;Left;15 reps   Other Prone Exercises prone quad set      Vasopneumatic   Number Minutes Vasopneumatic  15 minutes   Vasopnuematic Location  Knee   Vasopneumatic Pressure Medium   Vasopneumatic Temperature  32     Manual Therapy   Edema Management advised to sweep proximally to clear    Joint Mobilization GR II flexion mobs, patellar mobs and into ext in prone    Soft tissue mobilization lateral thigh and quad rolling with Tiger Roller                   PT Short Term Goals - 12/24/16 1153      PT SHORT TERM GOAL #1   Title She will be  independent with inital HEP   Status Achieved     PT SHORT TERM GOAL #2   Title she will improve active LT knee extension to -12 degrees to improve gait pattern   Status Achieved     PT SHORT TERM GOAL #3   Title She will improve active LT knee flexion to 108 degrees to improve gait pattern   Baseline AROM to 106 sitting   Status On-going     PT SHORT TERM GOAL #4   Title she will improve strength to 5/5 quads and hamstrings LT to improve gait   Status Achieved           PT Long Term Goals - 12/24/16 1154      PT LONG TERM GOAL #1   Title She will be independent with all HEP issued .    Status On-going     PT LONG TERM GOAL #2   Title She will walk staris step ovestep with one rail safely   Status Partially Met     PT LONG TERM GOAL #3   Title she will report able to be on feet for at least 4 hours to prep for return to work.    Status Unable to assess     PT LONG TERM GOAL #4   Title she will be able to squat for picking up 20 pounds floor to waist.    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  Pain and tension in quads, lateral knee and post calf. Rt knee circumdference 16 3/4 inch and L.t 17 1/2 inch.  Educated on fluid buildup and need for consistent stretching, hydrating and foam rolling as an option for self MFR.     PT Next Visit Plan add resistance bands for hip work , cont to progress ROM and functional strength , gait on TM uphill    PT Home Exercise Plan Prone hang and supine knee flexion with hip flex 90 degrees for ROM  , level 1-2 knee    Consulted  and Agree with Plan of Care Patient      Patient will benefit from skilled  therapeutic intervention in order to improve the following deficits and impairments:  Decreased activity tolerance, Increased edema, Decreased strength, Pain, Decreased range of motion, Difficulty walking  Visit Diagnosis: Left knee pain, unspecified chronicity  Muscle weakness (generalized)  Difficulty in walking, not elsewhere classified  Localized edema     Problem List Patient Active Problem List   Diagnosis Date Noted  . Primary localized osteoarthritis of left knee 10/27/2016  . Personal history of colonic polyp - adenoma 01/29/2014  . Hyperlipidemia 04/18/2012    PAA,[name] 12/29/2016, 2:29 PM  [facility] [address] [place], [place], [address] Phone: [phone]   Fax:  [phone]  Name: Dana Williams MRN: [id number] Date of Birth: 1961/06/19

## 2016-12-31 ENCOUNTER — Ambulatory Visit: Payer: 59 | Admitting: Physical Therapy

## 2016-12-31 DIAGNOSIS — R262 Difficulty in walking, not elsewhere classified: Secondary | ICD-10-CM

## 2016-12-31 DIAGNOSIS — R600 Localized edema: Secondary | ICD-10-CM

## 2016-12-31 DIAGNOSIS — M25562 Pain in left knee: Secondary | ICD-10-CM | POA: Diagnosis not present

## 2016-12-31 DIAGNOSIS — M6281 Muscle weakness (generalized): Secondary | ICD-10-CM

## 2016-12-31 NOTE — Therapy (Signed)
[place], [place], [address] Phone: [phone]   Fax:  [phone]  Physical Therapy Treatment  Patient Details  Name: Dana Williams MRN: [id number] Date of Birth: 11/21/1960 Referring Provider: Kathryne Hitch, MD  Encounter Date: 12/31/2016      PT End of Session - 12/31/16 1149    Visit Number 12   Number of Visits 16   Date for PT Re-Evaluation 01/17/17   PT Start Time 1056   PT Stop Time 1155   PT Time Calculation (min) 59 min   Activity Tolerance Patient tolerated treatment well   Behavior During Therapy [facility] for tasks assessed/performed      Past Medical History:  Diagnosis Date  . Arthritis   . Depression   . GERD (gastroesophageal reflux disease)   . H/O psoriatic arthritis    DX  age 38---- in remission for 20 years  . History of hiatal hernia   . History of kidney stones   . Hyperlipidemia   . Personal history of colonic polyp - adenoma 01/29/2014  . Plantar fasciitis of left foot   . PONV (postoperative nausea and vomiting)   . Renal calculus, left   . Sigmoid diverticulosis   . Vitamin D deficiency     Past Surgical History:  Procedure Laterality Date  . CLOSED MANIPULATION SHOULDER    . COLONOSCOPY WITH PROPOFOL  01-25-2014  . CYSTO/  BILATERAL RETROGRADE PYELOGRAM/  BILATERAL URETEROSCOPIC LASER LITHOTRIPSY STONE EXTRACTIONS/  BILATERAL STENT PLACEMENT  02-13-2010  . CYSTOSCOPY WITH RETROGRADE PYELOGRAM, URETEROSCOPY AND STENT PLACEMENT Left 04/21/2015   Procedure: CYSTOSCOPY WITH RETROGRADE PYELOGRAM, URETEROSCOPY, STONE EXTRACTION AND STENT PLACEMENT;  Surgeon: Franchot Gallo, MD;  Location: [facility];  Service: Urology;  Laterality: Left;  . CYSTOURETHROSCOPY/  PLACEMENT LYNX SUPRAPUBIC SLING  05-24-2008  . HAND TENDON SURGERY Right 1993  . HOLMIUM LASER APPLICATION Left 01/19/1061   Procedure: HOLMIUM LASER APPLICATION;  Surgeon: Franchot Gallo, MD;   Location: [facility];  Service: Urology;  Laterality: Left;  . LAPAROSCOPIC CHOLECYSTECTOMY  01-31-2002  . LEFT URETEROSCOPIC STONE EXTRACTION/  STENT PLACEMENT  10-06-2005  . RHINOPLASTY  2007  . STRABISMUS SURGERY Left age 73  . TOTAL KNEE ARTHROPLASTY Left 10/27/2016   Procedure: TOTAL KNEE ARTHROPLASTY;  Surgeon: [name], MD;  Location: [place];  Service: Orthopedics;  Laterality: Left;  . WISDOM TOOTH EXTRACTION  age 22    There were no vitals filed for this visit.      Subjective Assessment - 12/31/16 1103    Subjective Planning to walk outside today.  Still hurting post and lat knee, stiffness.  I can do all my exercises on my own.  Want to stretch more today.              [facility] Adult PT Treatment/Exercise - 12/31/16 1116      Knee/Hip Exercises: Stretches   Active Hamstring Stretch Left;3 reps   Active Hamstring Stretch Limitations contract-relax    Passive Hamstring Stretch Left;2 reps   Knee: Self-Stretch to increase Flexion Left;3 reps;30 seconds   Knee: Self-Stretch Limitations supine    Other Knee/Hip Stretches seated AROM 113 deg     Knee/Hip Exercises: Aerobic   [place] 2.5-2.8 mph at 3% for 8 min      Knee/Hip Exercises: Standing   Forward Lunges Both;1 set;15 reps   Functional Squat 1 set;20 reps   Stairs fast step ups 1 min x 2 leading  with R x 1 L x 1    Other Standing Knee Exercises plie squat and isometric with heel lifts      Cryotherapy   Number Minutes Cryotherapy 10 Minutes   Cryotherapy Location Knee   Type of Cryotherapy Ice pack     Manual Therapy   Manual therapy comments contract relax (by patient)    Joint Mobilization GR II flexion mobs, patellar mobs and into ext in prone   seated and supine    Soft tissue mobilization lateral quad                   PT Short Term Goals - 12/24/16 1153      PT SHORT TERM GOAL #1   Title She will be independent with inital HEP   Status Achieved     PT SHORT  TERM GOAL #2   Title she will improve active LT knee extension to -12 degrees to improve gait pattern   Status Achieved     PT SHORT TERM GOAL #3   Title She will improve active LT knee flexion to 108 degrees to improve gait pattern   Baseline AROM to 106 sitting   Status On-going     PT SHORT TERM GOAL #4   Title she will improve strength to 5/5 quads and hamstrings LT to improve gait   Status Achieved           PT Long Term Goals - 12/24/16 1154      PT LONG TERM GOAL #1   Title She will be independent with all HEP issued .    Status On-going     PT LONG TERM GOAL #2   Title She will walk staris step ovestep with one rail safely   Status Partially Met     PT LONG TERM GOAL #3   Title she will report able to be on feet for at least 4 hours to prep for return to work.    Status Unable to assess     PT LONG TERM GOAL #4   Title she will be able to squat for picking up 20 pounds floor to waist.    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  Worked on higher level endurance exercises today.  Improving slowly.     PT Next Visit Plan add resistance bands for hip work , cont to progress ROM and functional strength , gait on TM uphill    PT Home Exercise Plan Prone hang and supine knee flexion with hip flex 90 degrees for ROM  , level 1-2 knee    Consulted and Agree with Plan of Care Patient      Patient will benefit from skilled therapeutic intervention in order to improve the following deficits and impairments:  Decreased activity tolerance, Increased edema, Decreased strength, Pain, Decreased range  of motion, Difficulty walking  Visit Diagnosis: Left knee pain, unspecified chronicity  Muscle weakness (  generalized)  Difficulty in walking, not elsewhere classified  Localized edema     Problem List Patient Active Problem List   Diagnosis Date Noted  . Primary localized osteoarthritis of left knee 10/27/2016  . Personal history of colonic polyp - adenoma 01/29/2014  . Hyperlipidemia 04/18/2012    PAA,[name] 12/31/2016, 11:52 AM  [facility] [address] [place], [place], [address] Phone: [phone]   Fax:  [phone]  Name: Dana Williams MRN: [id number] Date of Birth: [birth year]   Raeford Razor, PT 12/31/16 11:53 AM Phone: [phone] Fax: [phone]

## 2017-01-03 ENCOUNTER — Ambulatory Visit: Payer: 59 | Admitting: Physical Therapy

## 2017-01-03 DIAGNOSIS — R262 Difficulty in walking, not elsewhere classified: Secondary | ICD-10-CM

## 2017-01-03 DIAGNOSIS — M6281 Muscle weakness (generalized): Secondary | ICD-10-CM

## 2017-01-03 DIAGNOSIS — M25562 Pain in left knee: Secondary | ICD-10-CM | POA: Diagnosis not present

## 2017-01-03 DIAGNOSIS — R600 Localized edema: Secondary | ICD-10-CM

## 2017-01-03 NOTE — Therapy (Signed)
[place], [place], [address] Phone: [phone]   Fax:  [phone]  Physical Therapy Treatment  Patient Details  Name: Dana Williams MRN: [id number] Date of Birth: [birth year] Referring Provider: Kathryne Hitch, MD  Encounter Date: 01/03/2017      PT End of Session - 01/03/17 1242    Visit Number 13   Number of Visits 16   Date for PT Re-Evaluation 01/17/17   PT Start Time 1105   PT Stop Time 1158   PT Time Calculation (min) 53 min   Activity Tolerance Patient tolerated treatment well   Behavior During Therapy [facility] for tasks assessed/performed      Past Medical History:  Diagnosis Date  . Arthritis   . Depression   . GERD (gastroesophageal reflux disease)   . H/O psoriatic arthritis    DX  age 38---- in remission for 20 years  . History of hiatal hernia   . History of kidney stones   . Hyperlipidemia   . Personal history of colonic polyp - adenoma 01/29/2014  . Plantar fasciitis of left foot   . PONV (postoperative nausea and vomiting)   . Renal calculus, left   . Sigmoid diverticulosis   . Vitamin D deficiency     Past Surgical History:  Procedure Laterality Date  . CLOSED MANIPULATION SHOULDER    . COLONOSCOPY WITH PROPOFOL  01-25-2014  . CYSTO/  BILATERAL RETROGRADE PYELOGRAM/  BILATERAL URETEROSCOPIC LASER LITHOTRIPSY STONE EXTRACTIONS/  BILATERAL STENT PLACEMENT  02-13-2010  . CYSTOSCOPY WITH RETROGRADE PYELOGRAM, URETEROSCOPY AND STENT PLACEMENT Left 04/21/2015   Procedure: CYSTOSCOPY WITH RETROGRADE PYELOGRAM, URETEROSCOPY, STONE EXTRACTION AND STENT PLACEMENT;  Surgeon: Franchot Gallo, MD;  Location: [facility];  Service: Urology;  Laterality: Left;  . CYSTOURETHROSCOPY/  PLACEMENT LYNX SUPRAPUBIC SLING  05-24-2008  . HAND TENDON SURGERY Right 1993  . HOLMIUM LASER APPLICATION Left 04/16/7034   Procedure: HOLMIUM LASER APPLICATION;  Surgeon: Franchot Gallo, MD;   Location: [facility];  Service: Urology;  Laterality: Left;  . LAPAROSCOPIC CHOLECYSTECTOMY  01-31-2002  . LEFT URETEROSCOPIC STONE EXTRACTION/  STENT PLACEMENT  10-06-2005  . RHINOPLASTY  2007  . STRABISMUS SURGERY Left age 6  . TOTAL KNEE ARTHROPLASTY Left 10/27/2016   Procedure: TOTAL KNEE ARTHROPLASTY;  Surgeon: [name], MD;  Location: [place];  Service: Orthopedics;  Laterality: Left;  . WISDOM TOOTH EXTRACTION  age 10    There were no vitals filed for this visit.      Subjective Assessment - 01/03/17 1111    Subjective No pain really, Was stiff this weekend because I didnt really go out much.  Sees MD next week.     Currently in Pain? No/denies              [facility] Adult PT Treatment/Exercise - 01/03/17 1119      Therapeutic Activites    Therapeutic Activities Work [employer]   Work Simulation push, Air cabin crew sled, loaded with 60 lbs as well, unloaded     Knee/Hip Exercises: Diplomatic Services operational officer Left;3 reps;60 seconds   Active Hamstring Stretch Limitations seated with step    Knee: Self-Stretch to increase Flexion Left;3 reps   Knee: Self-Stretch Limitations 30 sec      Knee/Hip Exercises: Standing   Other Standing Knee Exercises resisted walking 4 plates FW, back, large mini lunge and side ways gait , side stepping      Knee/Hip Exercises: Supine  Bridges with Clamshell Strengthening;Both;3 sets  varied foot positions      Knee/Hip Exercises: Sidelying   Hip ABduction Strengthening;Left;1 set;10 reps   Clams x 20 blue band    Other Sidelying Knee/Hip Exercises sidekick x 10 blue band      Vasopneumatic   Number Minutes Vasopneumatic  15 minutes   Vasopnuematic Location  Knee   Vasopneumatic Pressure Medium   Vasopneumatic Temperature  32                PT Education - 01/03/17 1241    Education provided Yes   Education Details functional strength    Person(s) Educated Patient   Methods Explanation    Comprehension Verbalized understanding          PT Short Term Goals - 12/24/16 1153      PT SHORT TERM GOAL #1   Title She will be independent with inital HEP   Status Achieved     PT SHORT TERM GOAL #2   Title she will improve active LT knee extension to -12 degrees to improve gait pattern   Status Achieved     PT SHORT TERM GOAL #3   Title She will improve active LT knee flexion to 108 degrees to improve gait pattern   Baseline AROM to 106 sitting   Status On-going     PT SHORT TERM GOAL #4   Title she will improve strength to 5/5 quads and hamstrings LT to improve gait   Status Achieved           PT Long Term Goals - 12/24/16 1154      PT LONG TERM GOAL #1   Title She will be independent with all HEP issued .    Status On-going     PT LONG TERM GOAL #2   Title She will walk staris step ovestep with one rail safely   Status Partially Met     PT LONG TERM GOAL #3   Title she will report able to be on feet for at least 4 hours to prep for return to work.    Status Unable to assess     PT LONG TERM GOAL #4   Title she will be able to squat for picking up 20 pounds floor to waist.    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  Needs some cueing to slow down.     PT Next Visit Plan more manual today  cont to progress ROM and functional strength , gait on TM uphill    PT Home Exercise Plan Prone hang and supine knee flexion with hip flex 90 degrees for ROM  , level 1-2 knee    Consulted and Agree with Plan of Care Patient       Patient will benefit from skilled therapeutic intervention in order to improve the following deficits and impairments:  Decreased activity tolerance, Increased edema, Decreased strength, Pain, Decreased range of motion, Difficulty walking  Visit Diagnosis: Left knee pain, unspecified chronicity  Muscle weakness (generalized)  Difficulty in walking, not elsewhere classified  Localized edema     Problem List Patient Active Problem List   Diagnosis Date Noted  . Primary localized osteoarthritis of left knee 10/27/2016  . Personal history of colonic polyp - adenoma 01/29/2014  . Hyperlipidemia 04/18/2012    PAA,[name] 01/03/2017, 12:43 PM  [facility] [address] [place], [place], [address] Phone: [phone]   Fax:  [phone]  Name: Dana Williams MRN: [id number] Date of Birth: [birth year]  Raeford Razor, PT 01/03/17 12:44 PM Phone: [phone] Fax: [phone]

## 2017-01-05 ENCOUNTER — Ambulatory Visit: Payer: 59 | Admitting: Physical Therapy

## 2017-01-05 DIAGNOSIS — R600 Localized edema: Secondary | ICD-10-CM

## 2017-01-05 DIAGNOSIS — R262 Difficulty in walking, not elsewhere classified: Secondary | ICD-10-CM

## 2017-01-05 DIAGNOSIS — M25562 Pain in left knee: Secondary | ICD-10-CM

## 2017-01-05 DIAGNOSIS — M6281 Muscle weakness (generalized): Secondary | ICD-10-CM

## 2017-01-05 NOTE — Therapy (Signed)
[place], [place], [address] Phone: [phone]   Fax:  [phone]  Physical Therapy Treatment  Patient Details  Name: Dana Williams MRN: [id number] Date of Birth: 1961-04-22 Referring Provider: Kathryne Hitch, MD  Encounter Date: 01/05/2017      PT End of Session - 01/05/17 0948    Visit Number 14   Number of Visits 16   Date for PT Re-Evaluation 01/17/17   PT Start Time 0934   PT Stop Time 1030   PT Time Calculation (min) 56 min   Activity Tolerance Patient tolerated treatment well   Behavior During Therapy [facility] for tasks assessed/performed      Past Medical History:  Diagnosis Date  . Arthritis   . Depression   . GERD (gastroesophageal reflux disease)   . H/O psoriatic arthritis    DX  age 56---- in remission for 20 years  . History of hiatal hernia   . History of kidney stones   . Hyperlipidemia   . Personal history of colonic polyp - adenoma 01/29/2014  . Plantar fasciitis of left foot   . PONV (postoperative nausea and vomiting)   . Renal calculus, left   . Sigmoid diverticulosis   . Vitamin D deficiency     Past Surgical History:  Procedure Laterality Date  . CLOSED MANIPULATION SHOULDER    . COLONOSCOPY WITH PROPOFOL  01-25-2014  . CYSTO/  BILATERAL RETROGRADE PYELOGRAM/  BILATERAL URETEROSCOPIC LASER LITHOTRIPSY STONE EXTRACTIONS/  BILATERAL STENT PLACEMENT  02-13-2010  . CYSTOSCOPY WITH RETROGRADE PYELOGRAM, URETEROSCOPY AND STENT PLACEMENT Left 04/21/2015   Procedure: CYSTOSCOPY WITH RETROGRADE PYELOGRAM, URETEROSCOPY, STONE EXTRACTION AND STENT PLACEMENT;  Surgeon: Franchot Gallo, MD;  Location: [facility];  Service: Urology;  Laterality: Left;  . CYSTOURETHROSCOPY/  PLACEMENT LYNX SUPRAPUBIC SLING  05-24-2008  . HAND TENDON SURGERY Right 1993  . HOLMIUM LASER APPLICATION Left 01/21/1828   Procedure: HOLMIUM LASER APPLICATION;  Surgeon: Franchot Gallo, MD;   Location: [facility];  Service: Urology;  Laterality: Left;  . LAPAROSCOPIC CHOLECYSTECTOMY  01-31-2002  . LEFT URETEROSCOPIC STONE EXTRACTION/  STENT PLACEMENT  10-06-2005  . RHINOPLASTY  2007  . STRABISMUS SURGERY Left age 20  . TOTAL KNEE ARTHROPLASTY Left 10/27/2016   Procedure: TOTAL KNEE ARTHROPLASTY;  Surgeon: [name], MD;  Location: [place];  Service: Orthopedics;  Laterality: Left;  . WISDOM TOOTH EXTRACTION  age 56    There were no vitals filed for this visit.      Subjective Assessment - 01/05/17 0937    Subjective I did alot yesterday, my knee was really tight last night.         6 min level 3 on recumbant bike for warm up, set close to pedals to maximize AROM.   Pilates Tower for LE/Core strength, postural strength, lumbopelvic disassociation and core control.  Exercises included:  [place]:   Supine Leg [place] Lt. Leg arcs single each leg and then double parallel and turn out.    Sidelying Leg [place]  Hip abd, add, sidekick and single leg squat       [facility] PT Assessment - 01/05/17 1251      PROM   Left Knee Flexion 120             [facility] Adult PT Treatment/Exercise - 01/05/17 1005      Vasopneumatic   Number Minutes Vasopneumatic  15 minutes   Vasopnuematic Location  Knee   Vasopneumatic Pressure  Medium   Vasopneumatic Temperature  32     Manual Therapy   Joint Mobilization Gr. III flexion in supine ,PROM                 PT Education - 01/05/17 1252    Education provided Yes   Education Details Pilates Tower    Person(s) Educated Patient   Methods Explanation   Comprehension Verbalized understanding;Returned demonstration          PT Short Term Goals - 12/24/16 1153      PT SHORT TERM GOAL #1   Title She will be independent with inital HEP   Status Achieved     PT SHORT TERM GOAL #2   Title she will improve active LT knee extension to -12 degrees to improve gait pattern   Status Achieved      PT SHORT TERM GOAL #3   Title She will improve active LT knee flexion to 108 degrees to improve gait pattern   Baseline AROM to 106 sitting   Status On-going     PT SHORT TERM GOAL #4   Title she will improve strength to 5/5 quads and hamstrings LT to improve gait   Status Achieved           PT Long Term Goals - 01/05/17 1254      PT LONG TERM GOAL #1   Title She will be independent with all HEP issued .    Status On-going     PT LONG TERM GOAL #2   Title She will walk staris step ovestep with one rail safely   Status Partially Met     PT LONG TERM GOAL #3   Title she will report able to be on feet for at least 4 hours to prep for return to work.    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  Good strength and core stability demonstrated today on Pilates Tower.    PT Next Visit Plan more manual today  cont to progress ROM and functional strength , gait on TM uphill    PT Home Exercise Plan Prone hang and supine knee flexion with hip flex 90 degrees for ROM  , level 1-2 knee    Consulted and Agree with Plan of Care Patient      Patient will benefit from skilled therapeutic intervention in order to improve the following deficits and impairments:  Decreased activity tolerance, Increased edema, Decreased strength, Pain, Decreased range of motion, Difficulty walking  Visit Diagnosis: Left  knee pain, unspecified chronicity  Muscle weakness (generalized)  Difficulty in walking, not elsewhere classified  Localized edema  Problem List Patient Active Problem List   Diagnosis Date Noted  . Primary localized osteoarthritis of left knee 10/27/2016  . Personal history of colonic polyp - adenoma 01/29/2014  . Hyperlipidemia 04/18/2012    PAA,[name] 01/05/2017, 12:58 PM  [facility] [address] [place], [place], [address] Phone: [phone]   Fax:  [phone]  Name: KANESHA CADLE MRN: [id number] Date of Birth: Aug 11, 1961   Raeford Razor, PT 01/05/17 1:00 PM Phone: [phone] Fax: [phone]

## 2017-01-06 ENCOUNTER — Ambulatory Visit: Payer: 59 | Admitting: Physical Therapy

## 2017-01-06 DIAGNOSIS — M6281 Muscle weakness (generalized): Secondary | ICD-10-CM | POA: Diagnosis not present

## 2017-01-06 DIAGNOSIS — R600 Localized edema: Secondary | ICD-10-CM | POA: Diagnosis not present

## 2017-01-06 DIAGNOSIS — M25562 Pain in left knee: Secondary | ICD-10-CM

## 2017-01-06 DIAGNOSIS — R262 Difficulty in walking, not elsewhere classified: Secondary | ICD-10-CM | POA: Diagnosis not present

## 2017-01-06 NOTE — Therapy (Signed)
[place], [place], [address] Phone: [phone]   Fax:  [phone]  Physical Therapy Treatment  Patient Details  Name: Dana Williams MRN: [id number] Date of Birth: 20-Jul-1961 Referring Provider: Kathryne Hitch, MD  Encounter Date: 01/06/2017      PT End of Session - 01/06/17 1417    Visit Number 15   Date for PT Re-Evaluation 01/17/17   PT Start Time 1330   PT Stop Time 1430   PT Time Calculation (min) 60 min   Activity Tolerance Patient tolerated treatment well   Behavior During Therapy [facility] for tasks assessed/performed      Past Medical History:  Diagnosis Date  . Arthritis   . Depression   . GERD (gastroesophageal reflux disease)   . H/O psoriatic arthritis    DX  age 29---- in remission for 20 years  . History of hiatal hernia   . History of kidney stones   . Hyperlipidemia   . Personal history of colonic polyp - adenoma 01/29/2014  . Plantar fasciitis of left foot   . PONV (postoperative nausea and vomiting)   . Renal calculus, left   . Sigmoid diverticulosis   . Vitamin D deficiency     Past Surgical History:  Procedure Laterality Date  . CLOSED MANIPULATION SHOULDER    . COLONOSCOPY WITH PROPOFOL  01-25-2014  . CYSTO/  BILATERAL RETROGRADE PYELOGRAM/  BILATERAL URETEROSCOPIC LASER LITHOTRIPSY STONE EXTRACTIONS/  BILATERAL STENT PLACEMENT  02-13-2010  . CYSTOSCOPY WITH RETROGRADE PYELOGRAM, URETEROSCOPY AND STENT PLACEMENT Left 04/21/2015   Procedure: CYSTOSCOPY WITH RETROGRADE PYELOGRAM, URETEROSCOPY, STONE EXTRACTION AND STENT PLACEMENT;  Surgeon: Franchot Gallo, MD;  Location: [facility];  Service: Urology;  Laterality: Left;  . CYSTOURETHROSCOPY/  PLACEMENT LYNX SUPRAPUBIC SLING  05-24-2008  . HAND TENDON SURGERY Right 1993  . HOLMIUM LASER APPLICATION Left 05/20/5464   Procedure: HOLMIUM LASER APPLICATION;  Surgeon: Franchot Gallo, MD;  Location: [facility];  Service: Urology;  Laterality: Left;  . LAPAROSCOPIC CHOLECYSTECTOMY  01-31-2002  . LEFT URETEROSCOPIC STONE EXTRACTION/  STENT PLACEMENT  10-06-2005  . RHINOPLASTY  2007  . STRABISMUS SURGERY Left age 28  . TOTAL KNEE ARTHROPLASTY Left 10/27/2016   Procedure: TOTAL KNEE ARTHROPLASTY;  Surgeon: [name], MD;  Location: [facility];  Service: Orthopedics;  Laterality: Left;  . WISDOM TOOTH EXTRACTION  age 30    There were no vitals filed for this visit.      Subjective Assessment - 01/06/17 1332    Subjective I had a stabbing pain in my knee last night.     Currently in Pain? Yes   Pain Score 2             [facility] PT Assessment - 01/05/17 1251      PROM   Left Knee Flexion 120              [facility] Adult PT Treatment/Exercise - 01/06/17 1342      Knee/Hip Exercises: Standing   Forward Lunges Right;Left;2 sets;10 reps   Forward Lunges Limitations alternating , added a kick    Hip Abduction Stengthening;Both;1 set   Abduction Limitations red band    Functional Squat 1 set;10 reps   Functional Squat Limitations red band      Knee/Hip Exercises: Sidelying   Clams x 20 red loop (heavy)    Other Sidelying Knee/Hip Exercises flexion red loop   Other Sidelying Knee/Hip Exercises sideplank x 3 x  10 sec each side, Rt. shoulder weakness      Vasopneumatic   Number Minutes Vasopneumatic  15 minutes   Vasopnuematic Location  Knee   Vasopneumatic Pressure Medium   Vasopneumatic Temperature  32     Manual Therapy   Joint Mobilization Gr. III flexion in supine ,PROM    Soft tissue mobilization lateral quad, ITB , patella mobs inferior                 PT Education - 01/06/17 1417    Education provided Yes   Education Details scar tissue massage    Person(s) Educated Patient   Methods Explanation;Demonstration   Comprehension Verbalized understanding;Returned demonstration          PT Short Term Goals - 12/24/16 1153      PT SHORT TERM GOAL  #1   Title She will be independent with inital HEP   Status Achieved     PT SHORT TERM GOAL #2   Title she will improve active LT knee extension to -12 degrees to improve gait pattern   Status Achieved     PT SHORT TERM GOAL #3   Title She will improve active LT knee flexion to 108 degrees to improve gait pattern   Baseline AROM to 106 sitting   Status On-going     PT SHORT TERM GOAL #4   Title she will improve strength to 5/5 quads and hamstrings LT to improve gait   Status Achieved           PT Long Term Goals - 01/05/17 1254      PT LONG TERM GOAL #1   Title She will be independent with all HEP issued .    Status On-going     PT LONG TERM GOAL #2   Title She will walk staris step ovestep with one rail safely   Status Partially Met     PT LONG TERM GOAL #3   Title she will report able to be on feet for at least 4 hours to prep for return to work.    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  Min swelling today.     PT Next Visit Plan more manual today  cont to progress ROM and functional strength , gait on TM uphill    PT Home Exercise Plan Prone hang and supine knee flexion with hip flex 90 degrees for ROM  , level 1-2 knee    Consulted and Agree with Plan of Care Patient      Patient will benefit from skilled therapeutic intervention in order  to improve the following deficits  and impairments:  Decreased activity tolerance, Increased edema, Decreased strength, Pain, Decreased range of motion, Difficulty walking  Visit Diagnosis: Left knee pain, unspecified chronicity  Muscle weakness (generalized)  Difficulty in walking, not elsewhere classified  Localized edema     Problem List Patient Active Problem List   Diagnosis Date Noted  . Primary localized osteoarthritis of left knee 10/27/2016  . Personal history of colonic polyp - adenoma 01/29/2014  . Hyperlipidemia 04/18/2012    Jeshua Ransford 01/06/2017, 2:19 PM  [facility] [address] [place], [place], [address] Phone: [phone]   Fax:  [phone]  Name: Dana Williams MRN: [id number] Date of Birth: 04/24/1961  Raeford Razor, PT 01/06/17 2:20 PM Phone: [phone] Fax: [phone]

## 2017-01-07 ENCOUNTER — Encounter: Payer: 59 | Admitting: Physical Therapy

## 2017-01-07 ENCOUNTER — Ambulatory Visit: Payer: 59 | Admitting: Physical Therapy

## 2017-01-10 ENCOUNTER — Ambulatory Visit: Payer: 59 | Admitting: Physical Therapy

## 2017-01-13 ENCOUNTER — Ambulatory Visit: Payer: 59 | Admitting: Physical Therapy

## 2017-01-14 ENCOUNTER — Ambulatory Visit: Payer: 59 | Attending: Orthopedic Surgery | Admitting: Physical Therapy

## 2017-01-14 DIAGNOSIS — R600 Localized edema: Secondary | ICD-10-CM | POA: Insufficient documentation

## 2017-01-14 DIAGNOSIS — M1712 Unilateral primary osteoarthritis, left knee: Secondary | ICD-10-CM | POA: Diagnosis not present

## 2017-01-14 DIAGNOSIS — M25562 Pain in left knee: Secondary | ICD-10-CM | POA: Insufficient documentation

## 2017-01-14 DIAGNOSIS — M6281 Muscle weakness (generalized): Secondary | ICD-10-CM | POA: Insufficient documentation

## 2017-01-14 DIAGNOSIS — R262 Difficulty in walking, not elsewhere classified: Secondary | ICD-10-CM | POA: Diagnosis present

## 2017-01-14 MED FILL — AMOXICILLIN 500 MG CAPSULE: 500 | 4 days supply | Qty: 16 | Fill #0

## 2017-01-14 MED FILL — FINACEA 15% GEL: 15 | 30 days supply | Qty: 50 | Fill #1

## 2017-01-14 MED FILL — HYDROCODON-APAP 10-325: 10-325 | 7 days supply | Qty: 30 | Fill #0

## 2017-01-14 NOTE — Therapy (Signed)
[place], [place], [address] Phone: [phone]   Fax:  [phone]  Physical Therapy Treatment  Patient Details  Name: Dana Williams MRN: [id number] Date of Birth: September 02, 1961 Referring Provider: Kathryne Hitch, MD  Encounter Date: 01/14/2017      PT End of Session - 01/14/17 1215    Visit Number 16   Number of Visits 16   Date for PT Re-Evaluation 01/17/17   PT Start Time 1100   PT Stop Time 1200   PT Time Calculation (min) 60 min   Activity Tolerance Patient tolerated treatment well   Behavior During Therapy [facility] for tasks assessed/performed      Past Medical History:  Diagnosis Date  . Arthritis   . Depression   . GERD (gastroesophageal reflux disease)   . H/O psoriatic arthritis    DX  age 56---- in remission for 20 years  . History of hiatal hernia   . History of kidney stones   . Hyperlipidemia   . Personal history of colonic polyp - adenoma 01/29/2014  . Plantar fasciitis of left foot   . PONV (postoperative nausea and vomiting)   . Renal calculus, left   . Sigmoid diverticulosis   . Vitamin D deficiency     Past Surgical History:  Procedure Laterality Date  . CLOSED MANIPULATION SHOULDER    . COLONOSCOPY WITH PROPOFOL  01-25-2014  . CYSTO/  BILATERAL RETROGRADE PYELOGRAM/  BILATERAL URETEROSCOPIC LASER LITHOTRIPSY STONE EXTRACTIONS/  BILATERAL STENT PLACEMENT  02-13-2010  . CYSTOSCOPY WITH RETROGRADE PYELOGRAM, URETEROSCOPY AND STENT PLACEMENT Left 04/21/2015   Procedure: CYSTOSCOPY WITH RETROGRADE PYELOGRAM, URETEROSCOPY, STONE EXTRACTION AND STENT PLACEMENT;  Surgeon: Franchot Gallo, MD;  Location: [facility];  Service: Urology;  Laterality: Left;  . CYSTOURETHROSCOPY/  PLACEMENT LYNX SUPRAPUBIC SLING  05-24-2008  . HAND TENDON SURGERY Right 1993  . HOLMIUM LASER APPLICATION Left 07/16/6944   Procedure: HOLMIUM LASER APPLICATION;  Surgeon: Franchot Gallo, MD;   Location: [facility];  Service: Urology;  Laterality: Left;  . LAPAROSCOPIC CHOLECYSTECTOMY  01-31-2002  . LEFT URETEROSCOPIC STONE EXTRACTION/  STENT PLACEMENT  10-06-2005  . RHINOPLASTY  2007  . STRABISMUS SURGERY Left age 55  . TOTAL KNEE ARTHROPLASTY Left 10/27/2016   Procedure: TOTAL KNEE ARTHROPLASTY;  Surgeon: [name], MD;  Location: [place];  Service: Orthopedics;  Laterality: Left;  . WISDOM TOOTH EXTRACTION  age 56    There were no vitals filed for this visit.      Subjective Assessment - 01/14/17 1102    Subjective Saw the MD and the Athletic trainer Ria Comment) gave her exercises for ITB.  I may go back to work week of 01/24/17.    Currently in Pain? No/denies              [facility] Adult PT Treatment/Exercise - 01/14/17 1119      Self-Care   Self-Care Other Self-Care Comments   Other Self-Care Comments  foam rolling, corrction for ITB stretches, scar tissue, encouragement      Knee/Hip Exercises: Stretches   Active Hamstring Stretch Left;3 reps   Quad Stretch Left;3 reps   Knee: Self-Stretch to increase Flexion Left;3 reps   Piriformis Stretch Left;1 rep   Other Knee/Hip Stretches TFL in standing    Other Knee/Hip Stretches in sidelying off table for L. TFL/ITB      Knee/Hip Exercises: Aerobic   Stationary Bike 8 min prior to PT  Vasopneumatic   Number Minutes Vasopneumatic  15 minutes   Vasopnuematic Location  Knee   Vasopneumatic Pressure Medium   Vasopneumatic Temperature  32     Manual Therapy   Joint Mobilization Gr. III flexion in supine ,PROM    Soft tissue mobilization lateral quad, ITB , patella mobs inferior                 PT Education - 01/14/17 1215    Education provided Yes   Education Details be consistent with HEP, see self care    Person(s) Educated Patient   Methods Explanation   Comprehension Verbalized understanding          PT Short Term Goals - 12/24/16 1153      PT SHORT TERM GOAL #1    Title She will be independent with inital HEP   Status Achieved     PT SHORT TERM GOAL #2   Title she will improve active LT knee extension to -12 degrees to improve gait pattern   Status Achieved     PT SHORT TERM GOAL #3   Title She will improve active LT knee flexion to 108 degrees to improve gait pattern   Baseline AROM to 106 sitting   Status On-going     PT SHORT TERM GOAL #4   Title she will improve strength to 5/5 quads and hamstrings LT to improve gait   Status Achieved           PT Long Term Goals - 01/14/17 1219      PT LONG TERM GOAL #1   Title She will be independent with all HEP issued .    Status On-going     PT LONG TERM GOAL #2   Title She will walk stairs step ovestep with one rail safely   Status Partially Met     PT LONG TERM GOAL #3   Title she will report able to be on feet for at least 4 hours to prep for return to work.    Status Partially Met     PT LONG TERM GOAL #4   Title she will be able to squat for Williams up 20 pounds floor to waist.    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  She admits to only stretching 1 time per day and not being thorough with HEP.  ITB may be constributing to tightness she feels in the knee, but the exercises given to her were not addressing her ITB.  She was advised to stick to her original HEP, find a recumbant bike and try and challenge her standing endurance.     PT Next Visit Plan RENEW for 2-3 times per week cont manual, AROM to L knee, vaso   PT Home Exercise Plan Prone hang and supine knee flexion with hip flex 90 degrees  for ROM  , level 1-2 knee , ITB stretch   Consulted and Agree with  Plan of Care Patient      Patient will benefit from skilled therapeutic intervention in order to improve the following deficits and impairments:  Decreased activity tolerance, Increased edema, Decreased strength, Pain, Decreased range of motion, Difficulty walking  Visit Diagnosis: Left knee pain, unspecified chronicity  Difficulty in walking, not elsewhere classified  Localized edema  Muscle weakness (generalized)     Problem List Patient Active Problem List   Diagnosis Date Noted  . Primary localized osteoarthritis of left knee 10/27/2016  . Personal history of colonic polyp - adenoma 01/29/2014  . Hyperlipidemia 04/18/2012    PAA,[name] 01/14/2017, 12:22 PM  [facility] [address] [place], [place], [address] Phone: [phone]   Fax:  [phone]  Name: Dana Williams MRN: [id number] Date of Birth: February 26, 1961  Raeford Razor, PT 01/14/17 12:23 PM Phone: [phone] Fax: [phone]

## 2017-01-17 ENCOUNTER — Ambulatory Visit: Payer: 59 | Admitting: Physical Therapy

## 2017-01-17 DIAGNOSIS — R262 Difficulty in walking, not elsewhere classified: Secondary | ICD-10-CM | POA: Diagnosis not present

## 2017-01-17 DIAGNOSIS — R600 Localized edema: Secondary | ICD-10-CM | POA: Diagnosis not present

## 2017-01-17 DIAGNOSIS — M25562 Pain in left knee: Secondary | ICD-10-CM

## 2017-01-17 DIAGNOSIS — M6281 Muscle weakness (generalized): Secondary | ICD-10-CM | POA: Diagnosis not present

## 2017-01-17 NOTE — Therapy (Signed)
[place], [place], [address] Phone: [phone]   Fax:  [phone]  Physical Therapy Treatment/Renewal  Patient Details  Name: Dana Williams MRN: [id number] Date of Birth: 1961/06/07 Referring Provider: Kathryne Hitch, MD  Encounter Date: 01/17/2017      PT End of Session - 01/17/17 1321    Visit Number 17   Number of Visits 28   Date for PT Re-Evaluation 02/28/17   PT Start Time 1104   PT Stop Time 1200   PT Time Calculation (min) 56 min   Activity Tolerance Patient tolerated treatment well   Behavior During Therapy [facility] for tasks assessed/performed      Past Medical History:  Diagnosis Date  . Arthritis   . Depression   . GERD (gastroesophageal reflux disease)   . H/O psoriatic arthritis    DX  age 24---- in remission for 20 years  . History of hiatal hernia   . History of kidney stones   . Hyperlipidemia   . Personal history of colonic polyp - adenoma 01/29/2014  . Plantar fasciitis of left foot   . PONV (postoperative nausea and vomiting)   . Renal calculus, left   . Sigmoid diverticulosis   . Vitamin D deficiency     Past Surgical History:  Procedure Laterality Date  . CLOSED MANIPULATION SHOULDER    . COLONOSCOPY WITH PROPOFOL  01-25-2014  . CYSTO/  BILATERAL RETROGRADE PYELOGRAM/  BILATERAL URETEROSCOPIC LASER LITHOTRIPSY STONE EXTRACTIONS/  BILATERAL STENT PLACEMENT  02-13-2010  . CYSTOSCOPY WITH RETROGRADE PYELOGRAM, URETEROSCOPY AND STENT PLACEMENT Left 04/21/2015   Procedure: CYSTOSCOPY WITH RETROGRADE PYELOGRAM, URETEROSCOPY, STONE EXTRACTION AND STENT PLACEMENT;  Surgeon: Franchot Gallo, MD;  Location: [facility];  Service: Urology;  Laterality: Left;  . CYSTOURETHROSCOPY/  PLACEMENT LYNX SUPRAPUBIC SLING  05-24-2008  . HAND TENDON SURGERY Right 1993  . HOLMIUM LASER APPLICATION Left 05/15/625   Procedure: HOLMIUM LASER APPLICATION;  Surgeon: Franchot Gallo,  MD;  Location: [facility];  Service: Urology;  Laterality: Left;  . LAPAROSCOPIC CHOLECYSTECTOMY  01-31-2002  . LEFT URETEROSCOPIC STONE EXTRACTION/  STENT PLACEMENT  10-06-2005  . RHINOPLASTY  2007  . STRABISMUS SURGERY Left age 60  . TOTAL KNEE ARTHROPLASTY Left 10/27/2016   Procedure: TOTAL KNEE ARTHROPLASTY;  Surgeon: [name], MD;  Location: [place];  Service: Orthopedics;  Laterality: Left;  . WISDOM TOOTH EXTRACTION  age 17    There were no vitals filed for this visit.      Subjective Assessment - 01/17/17 1108    Subjective Doing OK.  Going back to work 3- 6 hour days next week. My knee was killing me this weekend.    Currently in Pain? Yes   Pain Score 3    Pain Location Knee   Pain Orientation Left   Pain Descriptors / Indicators Sore   Pain Type Surgical pain            [facility] PT Assessment - 01/17/17 1115      AROM   Left Knee Extension 8   Left Knee Flexion 112  with strap     Strength   Right Knee Flexion 5/5   Right Knee Extension 5/5   Left Knee Flexion 5/5   Left Knee Extension 5/5                     [facility] Adult PT Treatment/Exercise - 01/17/17 1108      Ambulation/Gait  Gait Comments Walking with 25 lbs kettle bell 150 feet and unilateral carry of 15 lbs each hand.  No pain increase.  No difficulty.      Knee/Hip Exercises: Aerobic   Stationary Bike 5 min warm up level 3      Knee/Hip Exercises: Standing   Hip Abduction Stengthening;Both;1 set;10 reps   Lateral Step Up Left;2 sets;20 reps;Hand Hold: 0   Lateral Step Up Limitations 10 lb wgt      Vasopneumatic   Number Minutes Vasopneumatic  15 minutes   Vasopnuematic Location  Knee   Vasopneumatic Pressure Medium   Vasopneumatic Temperature  32       Pilates Reformer used for LE/core strength, postural strength, lumbopelvic disassociation and core control.  Exercises included: Scooter on 1 Red :done bilaterally. Press back x 10 with min UE assist,  added lunge x 10.  Works Environmental education officer and pelvic stability, endurance.         PT Education - 01/17/17 1321    Education provided Yes   Education Details goals, renewal    Person(s) Educated Patient   Methods Explanation   Comprehension Verbalized understanding          PT Short Term Goals - 01/17/17 1120      PT SHORT TERM GOAL #1   Title She will be independent with inital HEP   Status Achieved     PT SHORT TERM GOAL #2   Title she will improve active LT knee extension to -12 degrees to improve gait pattern   Status Achieved     PT SHORT TERM GOAL #3   Title She will improve active LT knee flexion to 108 degrees to improve gait pattern   Status Achieved     PT SHORT TERM GOAL #4   Title she will improve strength to 5/5 quads and hamstrings LT to improve gait   Status Achieved           PT Long Term Goals - 01/17/17 1121      PT LONG TERM GOAL #1   Title She will be independent with all HEP issued .    Status On-going     PT LONG TERM GOAL #2   Title She will walk stairs step ovestep with one rail safely   Status Achieved     PT LONG TERM GOAL #3   Title she will report able to be on feet for at least 4 hours to prep for return to work.    Baseline has not done , improving    Status Partially Met     PT LONG TERM GOAL #4   Title she will be able to squat for picking up 20 pounds floor to waist.    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    Time 6   Period  Weeks   Status New                Plan - 01/17/17 1142    Clinical Impression Statement Patient cont to make slow functional gains.  Her AROM is 8 to 112 deg using the stretch strap.  Did not measure PROM. She had been torquing her knee a bit as she stretched her knee into flexion but we corrected that today.  She returns to work next week and will need to be on her feet for 8 or more hours at a time.   She will continue PT for skilled PT to improve comfort with ROM and control swelling, pain as she transitions to work.    Rehab Potential Excellent   Clinical Impairments Affecting Rehab Potential --  3 times this week and then 2 x 5   PT Frequency 2x / week   PT Duration 6 weeks   PT Treatment/Interventions Cryotherapy;Electrical Stimulation;Stair training;Passive range of motion;Patient/family education;Therapeutic exercise;Taping;Manual techniques;Gait training   PT Next Visit Plan cont to progress ROM, PRE and vaso.  Did changing knee flexion stretch make lateral knee feel betteR?    PT Home Exercise Plan Prone hang and supine knee flexion with hip flex 90 degrees for ROM  , level 1-2 knee , ITB stretch   Consulted and Agree with Plan of Care Patient      Patient will benefit from skilled therapeutic intervention in order to improve the following deficits and impairments:  Decreased activity tolerance, Increased edema, Decreased strength, Pain, Decreased range of motion, Difficulty walking  Visit Diagnosis: Left knee pain, unspecified chronicity  Difficulty in walking, not elsewhere classified  Localized edema  Muscle weakness (generalized)     Problem List Patient Active Problem List   Diagnosis Date Noted  . Primary localized osteoarthritis of left knee 10/27/2016  . Personal history of colonic polyp - adenoma 01/29/2014  . Hyperlipidemia 04/18/2012    Nupur Hohman 01/17/2017, 3:17 PM  [facility] [address] [place], [place],  [address] Phone: [phone]   Fax:  [phone]  Name: Dana Williams MRN: [id number] Date of Birth: 1961-07-31  Raeford Razor, PT 01/17/17 3:24 PM Phone: [phone] Fax: [phone]

## 2017-01-19 ENCOUNTER — Ambulatory Visit: Payer: 59 | Admitting: Physical Therapy

## 2017-01-20 ENCOUNTER — Ambulatory Visit: Payer: 59 | Admitting: Physical Therapy

## 2017-01-20 DIAGNOSIS — M6281 Muscle weakness (generalized): Secondary | ICD-10-CM

## 2017-01-20 DIAGNOSIS — R262 Difficulty in walking, not elsewhere classified: Secondary | ICD-10-CM

## 2017-01-20 DIAGNOSIS — R600 Localized edema: Secondary | ICD-10-CM | POA: Diagnosis not present

## 2017-01-20 DIAGNOSIS — M25562 Pain in left knee: Secondary | ICD-10-CM | POA: Diagnosis not present

## 2017-01-20 NOTE — Therapy (Signed)
[place], [place], [address] Phone: [phone]   Fax:  [phone]  Physical Therapy Treatment  Patient Details  Name: Dana Williams MRN: [id number] Date of Birth: September 02, 1961 Referring Provider: Kathryne Hitch, MD  Encounter Date: 01/20/2017      PT End of Session - 01/20/17 0933    Visit Number 18   Number of Visits 28   Date for PT Re-Evaluation 02/28/17   PT Start Time 0927   PT Stop Time 1028   PT Time Calculation (min) 61 min   Activity Tolerance Patient tolerated treatment well   Behavior During Therapy [facility] for tasks assessed/performed      Past Medical History:  Diagnosis Date  . Arthritis   . Depression   . GERD (gastroesophageal reflux disease)   . H/O psoriatic arthritis    DX  age 69---- in remission for 20 years  . History of hiatal hernia   . History of kidney stones   . Hyperlipidemia   . Personal history of colonic polyp - adenoma 01/29/2014  . Plantar fasciitis of left foot   . PONV (postoperative nausea and vomiting)   . Renal calculus, left   . Sigmoid diverticulosis   . Vitamin D deficiency     Past Surgical History:  Procedure Laterality Date  . CLOSED MANIPULATION SHOULDER    . COLONOSCOPY WITH PROPOFOL  01-25-2014  . CYSTO/  BILATERAL RETROGRADE PYELOGRAM/  BILATERAL URETEROSCOPIC LASER LITHOTRIPSY STONE EXTRACTIONS/  BILATERAL STENT PLACEMENT  02-13-2010  . CYSTOSCOPY WITH RETROGRADE PYELOGRAM, URETEROSCOPY AND STENT PLACEMENT Left 04/21/2015   Procedure: CYSTOSCOPY WITH RETROGRADE PYELOGRAM, URETEROSCOPY, STONE EXTRACTION AND STENT PLACEMENT;  Surgeon: Franchot Gallo, MD;  Location: [facility];  Service: Urology;  Laterality: Left;  . CYSTOURETHROSCOPY/  PLACEMENT LYNX SUPRAPUBIC SLING  05-24-2008  . HAND TENDON SURGERY Right 1993  . HOLMIUM LASER APPLICATION Left 03/20/3874   Procedure: HOLMIUM LASER APPLICATION;  Surgeon: Franchot Gallo, MD;   Location: [facility];  Service: Urology;  Laterality: Left;  . LAPAROSCOPIC CHOLECYSTECTOMY  01-31-2002  . LEFT URETEROSCOPIC STONE EXTRACTION/  STENT PLACEMENT  10-06-2005  . RHINOPLASTY  2007  . STRABISMUS SURGERY Left age 52  . TOTAL KNEE ARTHROPLASTY Left 10/27/2016   Procedure: TOTAL KNEE ARTHROPLASTY;  Surgeon: [name], MD;  Location: [place];  Service: Orthopedics;  Laterality: Left;  . WISDOM TOOTH EXTRACTION  age 9    There were no vitals filed for this visit.      Subjective Assessment - 01/20/17 0931    Subjective Trying to get motivated to go back to work.  Has sinus infection? Knee doesnt feel bad today.    Currently in Pain? No/denies            [facility] PT Assessment - 01/20/17 0001      AROM   Right Knee Flexion 126   Left Knee Flexion 116              [facility] Adult PT Treatment/Exercise - 01/20/17 0001      Knee/Hip Exercises: Stretches   Active Hamstring Stretch Left;3 reps   Knee: Self-Stretch to increase Flexion Left;3 reps   Other Knee/Hip Stretches ITB in supine x 3 30 sec      Knee/Hip Exercises: Aerobic   Stationary Bike 5 min warm up level 3      Knee/Hip Exercises: Machines for Strengthening   Cybex Knee Extension 15 lbs x 2 sets focus on  eccentric    Cybex Knee Flexion 15 lbs x 20 L LE only      Knee/Hip Exercises: Standing   Lateral Step Up Right;Left;1 set;20 reps;Hand Hold: 0;Step Height: 6"   Lateral Step Up Limitations squat to step up deep   Forward Step Up Left;1 set;20 reps;Hand Hold: 1;Hand Hold: 0;Step Height: 6"   Forward Step Up Limitations step overs    Wall Squat --     Knee/Hip Exercises: Seated   Stool Scoot - Round Trips 150 feet, LLE    Sit to Sand 20 reps;without UE support;Other (comment)  1 set with mostly L knee , Rt. leg in front      Knee/Hip Exercises: Supine   Quad Sets Strengthening;Left;2 sets;20 reps   Quad Sets Limitations 5 sec    Short Arc Quad Sets Strengthening;Left;1 set;10  reps   Knee Flexion Left;1 set;5 reps     Vasopneumatic   Number Minutes Vasopneumatic  15 minutes   Vasopnuematic Location  Knee   Vasopneumatic Pressure Medium   Vasopneumatic Temperature  32     Manual Therapy   Joint Mobilization Gr. III flexion and ext  in supine ,PROM                 PT Education - 01/20/17 1019    Education provided No          PT Short Term Goals - 01/17/17 1120      PT SHORT TERM GOAL #1   Title She will be independent with inital HEP   Status Achieved     PT SHORT TERM GOAL #2   Title she will improve active LT knee extension to -12 degrees to improve gait pattern   Status Achieved     PT SHORT TERM GOAL #3   Title She will improve active LT knee flexion to 108 degrees to improve gait pattern   Status Achieved     PT SHORT TERM GOAL #4   Title she will improve strength to 5/5 quads and hamstrings LT to improve gait   Status Achieved           PT Long Term Goals - 01/17/17 1121      PT LONG TERM GOAL #1   Title She will be independent with all HEP issued .    Status On-going     PT LONG TERM GOAL #2   Title She will walk stairs step ovestep with one rail safely   Status Achieved     PT LONG TERM GOAL #3   Title she will report able to be on feet for at least 4 hours to prep for return to work.    Baseline has not done , improving    Status Partially Met     PT LONG TERM GOAL #4   Title she will be able to squat for picking up 20 pounds floor to waist.    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    Time 6   Period Weeks   Status New               Plan - 01/20/17 1013    Clinical Impression Statement Worked on functional strengthening, sit to stand from a low surface and eccentric control.  Returns to work 6 hour days next week, then 12 hour days the following week. She has been feeling less lateral knee tightness now that she has modified her stretching technique.    PT Next Visit Plan cont to progress ROM, PRE and vaso.     PT Home Exercise Plan Prone hang and supine knee flexion with hip flex 90 degrees for ROM  , level 1-2 knee , ITB stretch   Consulted and Agree with Plan of Care Patient      Patient will benefit from skilled therapeutic intervention in order to improve the following deficits and impairments:  Decreased activity tolerance, Increased edema, Decreased strength, Pain, Decreased range of motion, Difficulty walking  Visit Diagnosis: Left knee pain, unspecified chronicity  Difficulty in walking, not elsewhere classified  Localized edema  Muscle weakness (generalized)     Problem List Patient Active Problem List   Diagnosis Date Noted  . Primary localized osteoarthritis of left knee 10/27/2016  . Personal history of colonic polyp - adenoma 01/29/2014  . Hyperlipidemia 04/18/2012    PAA,[name] 01/20/2017, 10:20 AM  [facility] [address] [place], [place], [address] Phone: [phone]   Fax:  [phone]  Name: Dana Williams MRN: [id number] Date of Birth: 1960/12/04  Raeford Razor, PT 01/20/17 10:21 AM Phone: [phone] Fax: [phone]

## 2017-01-21 ENCOUNTER — Ambulatory Visit: Payer: 59 | Admitting: Physical Therapy

## 2017-01-24 ENCOUNTER — Ambulatory Visit: Payer: 59 | Admitting: Physical Therapy

## 2017-01-26 ENCOUNTER — Ambulatory Visit: Payer: 59 | Admitting: Physical Therapy

## 2017-01-26 DIAGNOSIS — R600 Localized edema: Secondary | ICD-10-CM

## 2017-01-26 DIAGNOSIS — M25562 Pain in left knee: Secondary | ICD-10-CM

## 2017-01-26 DIAGNOSIS — R262 Difficulty in walking, not elsewhere classified: Secondary | ICD-10-CM

## 2017-01-26 DIAGNOSIS — M6281 Muscle weakness (generalized): Secondary | ICD-10-CM

## 2017-01-26 NOTE — Therapy (Signed)
[place], [place], [address] Phone: [phone]   Fax:  [phone]  Physical Therapy Treatment  Patient Details  Name: Dana Williams MRN: [id number] Date of Birth: [birth year] Referring Provider: Kathryne Hitch, MD  Encounter Date: 01/26/2017      PT End of Session - 01/26/17 1258    Visit Number 19   Number of Visits 28   Date for PT Re-Evaluation 02/28/17   PT Start Time 1106   PT Stop Time 1200   PT Time Calculation (min) 54 min   Activity Tolerance Patient tolerated treatment well   Behavior During Therapy [facility] for tasks assessed/performed      Past Medical History:  Diagnosis Date  . Arthritis   . Depression   . GERD (gastroesophageal reflux disease)   . H/O psoriatic arthritis    DX  age 38---- in remission for 20 years  . History of hiatal hernia   . History of kidney stones   . Hyperlipidemia   . Personal history of colonic polyp - adenoma 01/29/2014  . Plantar fasciitis of left foot   . PONV (postoperative nausea and vomiting)   . Renal calculus, left   . Sigmoid diverticulosis   . Vitamin D deficiency     Past Surgical History:  Procedure Laterality Date  . CLOSED MANIPULATION SHOULDER    . COLONOSCOPY WITH PROPOFOL  01-25-2014  . CYSTO/  BILATERAL RETROGRADE PYELOGRAM/  BILATERAL URETEROSCOPIC LASER LITHOTRIPSY STONE EXTRACTIONS/  BILATERAL STENT PLACEMENT  02-13-2010  . CYSTOSCOPY WITH RETROGRADE PYELOGRAM, URETEROSCOPY AND STENT PLACEMENT Left 04/21/2015   Procedure: CYSTOSCOPY WITH RETROGRADE PYELOGRAM, URETEROSCOPY, STONE EXTRACTION AND STENT PLACEMENT;  Surgeon: Franchot Gallo, MD;  Location: [facility];  Service: Urology;  Laterality: Left;  . CYSTOURETHROSCOPY/  PLACEMENT LYNX SUPRAPUBIC SLING  05-24-2008  . HAND TENDON SURGERY Right 1993  . HOLMIUM LASER APPLICATION Left 03/18/6269   Procedure: HOLMIUM LASER APPLICATION;  Surgeon: Franchot Gallo, MD;   Location: [facility];  Service: Urology;  Laterality: Left;  . LAPAROSCOPIC CHOLECYSTECTOMY  01-31-2002  . LEFT URETEROSCOPIC STONE EXTRACTION/  STENT PLACEMENT  10-06-2005  . RHINOPLASTY  2007  . STRABISMUS SURGERY Left age 69  . TOTAL KNEE ARTHROPLASTY Left 10/27/2016   Procedure: TOTAL KNEE ARTHROPLASTY;  Surgeon: [name], MD;  Location: [place];  Service: Orthopedics;  Laterality: Left;  . WISDOM TOOTH EXTRACTION  age 2    There were no vitals filed for this visit.      Subjective Assessment - 01/26/17 1117    Subjective Back to work and was very busy. Knee just very tight feeling.  No pain really.    Currently in Pain? No/denies            [facility] PT Assessment - 01/26/17 0001      AROM   Left Knee Flexion 114     PROM   Left Knee Extension 6   Left Knee Flexion 118             [facility] Adult PT Treatment/Exercise - 01/26/17 0001      Knee/Hip Exercises: Stretches   Active Hamstring Stretch Left;3 reps   Knee: Self-Stretch to increase Flexion Left;5 reps   Knee: Self-Stretch Limitations 30 sec    Other Knee/Hip Stretches lateral hip stretch after sidelying      Knee/Hip Exercises: Aerobic   Tread Mill 3.0 mph and no incline      Knee/Hip Exercises: Sidelying  Hip ABduction Strengthening;Both;1 set;20 reps   Hip ABduction Limitations sidekick x 10 each leg      Knee/Hip Exercises: Prone   Other Prone Exercises knee flexion with strap 30 sec x 3 contract relax      Vasopneumatic   Number Minutes Vasopneumatic  15 minutes   Vasopnuematic Location  Knee   Vasopneumatic Pressure Medium   Vasopneumatic Temperature  32     Manual Therapy   Joint Mobilization Gr. III flexion and ext  in supine and prone  ,PROM                   PT Short Term Goals - 01/17/17 1120      PT SHORT TERM GOAL #1   Title She will be independent with inital HEP   Status Achieved     PT SHORT TERM GOAL #2   Title she will improve active LT knee  extension to -12 degrees to improve gait pattern   Status Achieved     PT SHORT TERM GOAL #3   Title She will improve active LT knee flexion to 108 degrees to improve gait pattern   Status Achieved     PT SHORT TERM GOAL #4   Title she will improve strength to 5/5 quads and hamstrings LT to improve gait   Status Achieved           PT Long Term Goals - 01/17/17 1121      PT LONG TERM GOAL #1   Title She will be independent with all HEP issued .    Status On-going     PT LONG TERM GOAL #2   Title She will walk stairs step ovestep with one rail safely   Status Achieved     PT LONG TERM GOAL #3   Title she will report able to be on feet for at least 4 hours to prep for return to work.    Baseline has not done , improving    Status Partially Met     PT LONG TERM GOAL #4   Title she will be able to squat for picking up 20 pounds floor to waist.    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    Time 6   Period Weeks   Status New               Plan - 01/26/17 1258    Clinical Impression Statement Pt with a good start back to work, with some stiffness and lateral knee pain intermittently. She is able to stand, sit and walk when she needs to to relieve stiffness.  Progressing functionally despite a plateau of ROM.    PT Next Visit Plan cont to progress ROM, PRE and vaso.  MANUAL   PT Home Exercise Plan Prone hang and supine knee  flexion with hip flex 90 degrees for ROM  , level 1-2 knee , ITB stretch    Consulted and Agree with Plan of Care Patient      Patient will benefit from skilled therapeutic intervention in order to improve the following deficits and impairments:  Decreased activity tolerance, Increased edema, Decreased strength, Pain, Decreased range of motion, Difficulty walking  Visit Diagnosis: Left knee pain, unspecified chronicity  Difficulty in walking, not elsewhere classified  Localized edema  Muscle weakness (generalized)     Problem List Patient Active Problem List   Diagnosis Date Noted  . Primary localized osteoarthritis of left knee 10/27/2016  . Personal history of colonic polyp - adenoma 01/29/2014  . Hyperlipidemia 04/18/2012    PAA,[name] 01/26/2017, 1:18 PM  [facility] [address] [place], [place], [address] Phone: [phone]   Fax:  [phone]  Name: Dana Williams MRN: [id number] Date of Birth: 28-Apr-1961  Raeford Razor, PT 01/26/17 1:19 PM Phone: [phone] Fax: [phone]

## 2017-01-28 ENCOUNTER — Ambulatory Visit: Payer: 59 | Admitting: Physical Therapy

## 2017-01-28 DIAGNOSIS — M25562 Pain in left knee: Secondary | ICD-10-CM | POA: Diagnosis not present

## 2017-01-28 DIAGNOSIS — R262 Difficulty in walking, not elsewhere classified: Secondary | ICD-10-CM | POA: Diagnosis not present

## 2017-01-28 DIAGNOSIS — M6281 Muscle weakness (generalized): Secondary | ICD-10-CM | POA: Diagnosis not present

## 2017-01-28 DIAGNOSIS — R600 Localized edema: Secondary | ICD-10-CM

## 2017-01-28 NOTE — Therapy (Signed)
[place], [place], [address] Phone: [phone]   Fax:  [phone]  Physical Therapy Treatment  Patient Details  Name: Dana Williams MRN: [id number] Date of Birth: 11/11/1961 Referring Provider: Kathryne Hitch, MD  Encounter Date: 01/28/2017      PT End of Session - 01/28/17 1151    Visit Number 20   Number of Visits 28   Date for PT Re-Evaluation 02/28/17   PT Start Time 1108   PT Stop Time 1210   PT Time Calculation (min) 62 min   Activity Tolerance Patient tolerated treatment well   Behavior During Therapy [facility] for tasks assessed/performed      Past Medical History:  Diagnosis Date  . Arthritis   . Depression   . GERD (gastroesophageal reflux disease)   . H/O psoriatic arthritis    DX  age 56---- in remission for 20 years  . History of hiatal hernia   . History of kidney stones   . Hyperlipidemia   . Personal history of colonic polyp - adenoma 01/29/2014  . Plantar fasciitis of left foot   . PONV (postoperative nausea and vomiting)   . Renal calculus, left   . Sigmoid diverticulosis   . Vitamin D deficiency     Past Surgical History:  Procedure Laterality Date  . CLOSED MANIPULATION SHOULDER    . COLONOSCOPY WITH PROPOFOL  01-25-2014  . CYSTO/  BILATERAL RETROGRADE PYELOGRAM/  BILATERAL URETEROSCOPIC LASER LITHOTRIPSY STONE EXTRACTIONS/  BILATERAL STENT PLACEMENT  02-13-2010  . CYSTOSCOPY WITH RETROGRADE PYELOGRAM, URETEROSCOPY AND STENT PLACEMENT Left 04/21/2015   Procedure: CYSTOSCOPY WITH RETROGRADE PYELOGRAM, URETEROSCOPY, STONE EXTRACTION AND STENT PLACEMENT;  Surgeon: Franchot Gallo, MD;  Location: [facility];  Service: Urology;  Laterality: Left;  . CYSTOURETHROSCOPY/  PLACEMENT LYNX SUPRAPUBIC SLING  05-24-2008  . HAND TENDON SURGERY Right 1993  . HOLMIUM LASER APPLICATION Left 01/18/92   Procedure: HOLMIUM LASER APPLICATION;  Surgeon: Franchot Gallo, MD;   Location: [facility];  Service: Urology;  Laterality: Left;  . LAPAROSCOPIC CHOLECYSTECTOMY  01-31-2002  . LEFT URETEROSCOPIC STONE EXTRACTION/  STENT PLACEMENT  10-06-2005  . RHINOPLASTY  2007  . STRABISMUS SURGERY Left age 59  . TOTAL KNEE ARTHROPLASTY Left 10/27/2016   Procedure: TOTAL KNEE ARTHROPLASTY;  Surgeon: [name], MD;  Location: [place];  Service: Orthopedics;  Laterality: Left;  . WISDOM TOOTH EXTRACTION  age 56    There were no vitals filed for this visit.      Subjective Assessment - 01/28/17 1114    Subjective Noticed a little swelling.    Currently in Pain? Yes   Pain Score 2            [facility] Adult PT Treatment/Exercise - 01/28/17 0001      Knee/Hip Exercises: Stretches   Active Hamstring Stretch Left;3 reps   Knee: Self-Stretch to increase Flexion Left;5 reps   Knee: Self-Stretch Limitations 30 sec    Other Knee/Hip Stretches lateral hip stretch     Knee/Hip Exercises: Aerobic   Stationary Bike 10 min on bike prior to PT      Knee/Hip Exercises: Standing   Side Lunges Both;1 set;10 reps   Side Lunges Limitations used towel to slide    Lunge Walking - Round Trips 4 x 25 feet alt walking lunge    Gait Training Sumo squat x 15    Other Standing Knee Exercises back lunge x 10 each    Other  Standing Knee Exercises monster walk 4 x 25 feet blue loop     Vasopneumatic   Number Minutes Vasopneumatic  15 minutes   Vasopnuematic Location  Knee   Vasopneumatic Pressure Medium   Vasopneumatic Temperature  32     Manual Therapy   Manual Therapy Passive ROM   Joint Mobilization Gr. III flexion and ext  in supine and prone  ,PROM   seated and supine    Passive ROM flexion ext                   PT Short Term Goals - 01/17/17 1120      PT SHORT TERM GOAL #1   Title She will be independent with inital HEP   Status Achieved     PT SHORT TERM GOAL #2   Title she will improve active LT knee extension to -12 degrees to improve  gait pattern   Status Achieved     PT SHORT TERM GOAL #3   Title She will improve active LT knee flexion to 108 degrees to improve gait pattern   Status Achieved     PT SHORT TERM GOAL #4   Title she will improve strength to 5/5 quads and hamstrings LT to improve gait   Status Achieved           PT Long Term Goals - 01/28/17 1136      PT LONG TERM GOAL #1   Title She will be independent with all HEP issued .    Status On-going     PT LONG TERM GOAL #2   Title She will walk stairs step ovestep with one rail safely   Status Achieved     PT LONG TERM GOAL #3   Title she will report able to be on feet for at least 4 hours to prep for return to work.    Baseline 3 hour s   Status Partially Met     PT LONG TERM GOAL #4   Title she will be able to squat for picking up 20 pounds floor to waist.    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    Baseline was tight and congested but not painful >5 /10    Status On-going               Plan - 01/28/17 1153    Clinical Impression Statement Pt complains of mostly stiffness with return to work.  MIn swelling in L ankle end of day. Worked on closed chain for functional strengthening.  Asked her to be more aggressive with stretching on days off from work especially.    PT Next Visit Plan cont to progress ROM, PRE and vaso.  MANUAL   PT Home Exercise Plan Prone hang and supine knee flexion with hip flex 90 degrees for ROM  , level 1-2 knee , ITB stretch   Consulted and Agree with  Plan of Care Patient      Patient will  benefit from skilled therapeutic intervention in order to improve the following deficits and impairments:  Decreased activity tolerance, Increased edema, Decreased strength, Pain, Decreased range of motion, Difficulty walking  Visit Diagnosis: Left knee pain, unspecified chronicity  Difficulty in walking, not elsewhere classified  Localized edema  Muscle weakness (generalized)     Problem List Patient Active Problem List   Diagnosis Date Noted  . Primary localized osteoarthritis of left knee 10/27/2016  . Personal history of colonic polyp - adenoma 01/29/2014  . Hyperlipidemia 04/18/2012    PAA,[name] 01/28/2017, 11:56 AM  [facility] [address] [place], [place], [address] Phone: [phone]   Fax:  [phone]  Name: Dana Williams MRN: [id number] Date of Birth: 07-26-1961  Raeford Razor, PT 01/28/17 11:56 AM Phone: [phone] Fax: [phone]

## 2017-01-31 ENCOUNTER — Ambulatory Visit: Payer: 59 | Admitting: Physical Therapy

## 2017-01-31 DIAGNOSIS — M25562 Pain in left knee: Secondary | ICD-10-CM

## 2017-01-31 DIAGNOSIS — M6281 Muscle weakness (generalized): Secondary | ICD-10-CM | POA: Diagnosis not present

## 2017-01-31 DIAGNOSIS — R600 Localized edema: Secondary | ICD-10-CM | POA: Diagnosis not present

## 2017-01-31 DIAGNOSIS — R262 Difficulty in walking, not elsewhere classified: Secondary | ICD-10-CM | POA: Diagnosis not present

## 2017-01-31 NOTE — Therapy (Signed)
[place], [place], [address] Phone: [phone]   Fax:  [phone]  Physical Therapy Treatment  Patient Details  Name: Dana Williams MRN: [id number] Date of Birth: Feb 29, 1961 Referring Provider: Kathryne Hitch, MD  Encounter Date: 01/31/2017      PT End of Session - 01/31/17 0959    Visit Number 21   Number of Visits 28   Date for PT Re-Evaluation 02/28/17   PT Start Time 0750   PT Stop Time 0900   PT Time Calculation (min) 70 min   Activity Tolerance Patient tolerated treatment well   Behavior During Therapy [facility] for tasks assessed/performed      Past Medical History:  Diagnosis Date  . Arthritis   . Depression   . GERD (gastroesophageal reflux disease)   . H/O psoriatic arthritis    DX  age 45---- in remission for 20 years  . History of hiatal hernia   . History of kidney stones   . Hyperlipidemia   . Personal history of colonic polyp - adenoma 01/29/2014  . Plantar fasciitis of left foot   . PONV (postoperative nausea and vomiting)   . Renal calculus, left   . Sigmoid diverticulosis   . Vitamin D deficiency     Past Surgical History:  Procedure Laterality Date  . CLOSED MANIPULATION SHOULDER    . COLONOSCOPY WITH PROPOFOL  01-25-2014  . CYSTO/  BILATERAL RETROGRADE PYELOGRAM/  BILATERAL URETEROSCOPIC LASER LITHOTRIPSY STONE EXTRACTIONS/  BILATERAL STENT PLACEMENT  02-13-2010  . CYSTOSCOPY WITH RETROGRADE PYELOGRAM, URETEROSCOPY AND STENT PLACEMENT Left 04/21/2015   Procedure: CYSTOSCOPY WITH RETROGRADE PYELOGRAM, URETEROSCOPY, STONE EXTRACTION AND STENT PLACEMENT;  Surgeon: Franchot Gallo, MD;  Location: [facility];  Service: Urology;  Laterality: Left;  . CYSTOURETHROSCOPY/  PLACEMENT LYNX SUPRAPUBIC SLING  05-24-2008  . HAND TENDON SURGERY Right 1993  . HOLMIUM LASER APPLICATION Left 0/07/6282   Procedure: HOLMIUM LASER APPLICATION;  Surgeon: Franchot Gallo, MD;   Location: [facility];  Service: Urology;  Laterality: Left;  . LAPAROSCOPIC CHOLECYSTECTOMY  01-31-2002  . LEFT URETEROSCOPIC STONE EXTRACTION/  STENT PLACEMENT  10-06-2005  . RHINOPLASTY  2007  . STRABISMUS SURGERY Left age 65  . TOTAL KNEE ARTHROPLASTY Left 10/27/2016   Procedure: TOTAL KNEE ARTHROPLASTY;  Surgeon: [name], MD;  Location: [place];  Service: Orthopedics;  Laterality: Left;  . WISDOM TOOTH EXTRACTION  age 3    There were no vitals filed for this visit.      Subjective Assessment - 01/31/17 0758    Subjective Was up on it all day Saturday, no pain just felt tight.  Did my stretches Sunday.     Currently in Pain? No/denies            [facility] PT Assessment - 01/31/17 0826      AROM   Left Knee Flexion 116             [facility] Adult PT Treatment/Exercise - 01/31/17 0801      Knee/Hip Exercises: Stretches   Active Hamstring Stretch Left;3 reps   Quad Stretch Left;3 reps;30 seconds   Knee: Self-Stretch to increase Flexion Left;3 reps   Knee: Self-Stretch Limitations 30 sec    Other Knee/Hip Stretches ITB, ant hip 2x 30 sec each      Knee/Hip Exercises: Aerobic   Stationary Bike 6 min prior to PT    Elliptical 5 min no ramp level 5 resist.  Knee/Hip Exercises: Machines for Strengthening   Cybex Knee Extension 15 lbs x 2 sets focus on eccentric , toes up then toes out    Cybex Knee Flexion 20 lbs x 20 L LE only    Cybex Leg Press 1 plates x 20 for ROM   worked ROM with hold for flexion      Knee/Hip Exercises: Seated   Stool Scoot - Round Trips 100 feet using LLE     Knee/Hip Exercises: Prone   Hamstring Curl 2 sets;20 reps   Hamstring Curl Limitations 8   Hip Extension Strengthening;Left;2 sets   Hip Extension Limitations no wgt.    Other Prone Exercises knee flexion with strap 30 sec x 3 contract relax    Other Prone Exercises glute kick no wgt. x 20      Vasopneumatic   Number Minutes Vasopneumatic  15 minutes    Vasopnuematic Location  Knee   Vasopneumatic Pressure Medium   Vasopneumatic Temperature  32     Manual Therapy   Manual Therapy Passive ROM   Joint Mobilization Gr. III flexion and ext  in supine and prone  ,PROM   prone    Passive ROM flexion , extension                  PT Short Term Goals - 01/17/17 1120      PT SHORT TERM GOAL #1   Title She will be independent with inital HEP   Status Achieved     PT SHORT TERM GOAL #2   Title she will improve active LT knee extension to -12 degrees to improve gait pattern   Status Achieved     PT SHORT TERM GOAL #3   Title She will improve active LT knee flexion to 108 degrees to improve gait pattern   Status Achieved     PT SHORT TERM GOAL #4   Title she will improve strength to 5/5 quads and hamstrings LT to improve gait   Status Achieved           PT Long Term Goals - 01/28/17 1136      PT LONG TERM GOAL #1   Title She will be independent with all HEP issued .    Status On-going     PT LONG TERM GOAL #2   Title She will walk stairs step ovestep with one rail safely   Status Achieved     PT LONG TERM GOAL #3   Title she will report able to be on feet for at least 4 hours to prep for return to work.    Baseline 3 hour s   Status Partially Met     PT LONG TERM GOAL #4   Title she will be able to squat for picking up 20 pounds floor to waist.    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    Baseline was tight  and congested but not painful >5 /10    Status On-going               Plan -  01/31/17 0959    Clinical Impression Statement Dana Williams is doing well, limited by lateral knee stiffness on a daily basis.  She is concerned she wont get to bend her knee like her Rt. one.  Has good strength and endurance but with AAROM gets to 116 deg flexion at best.  I encouraged her that the stiffness with eventually dissapate.  Focused alot on manual today.  no pain post.    PT Next Visit Plan cont to progress ROM, PRE and vaso.  MANUAL   PT Home Exercise Plan Prone hang and supine knee flexion with hip flex 90 degrees for ROM  , level 1-2 knee , ITB stretch, given lateral band walks and lunges for HEP    Consulted and Agree with Plan of Care Patient      Patient will benefit from skilled therapeutic intervention in order to improve the following deficits and impairments:  Decreased activity tolerance, Increased edema, Decreased strength, Pain, Decreased range of motion, Difficulty walking  Visit Diagnosis: Left knee pain, unspecified chronicity  Difficulty in walking, not elsewhere classified  Localized edema  Muscle weakness (generalized)     Problem List Patient Active Problem List   Diagnosis Date Noted  . Primary localized osteoarthritis of left knee 10/27/2016  . Personal history of colonic polyp - adenoma 01/29/2014  . Hyperlipidemia 04/18/2012    PAA,[name] 01/31/2017, 10:03 AM  [facility] [address] [place], [place], [address] Phone: [phone]   Fax:  [phone]  Name: Dana Williams MRN: [id number] Date of Birth: 1961-08-21  Raeford Razor, PT 01/31/17 10:03 AM Phone: [phone] Fax: [phone]

## 2017-02-01 ENCOUNTER — Encounter: Payer: 59 | Admitting: Physical Therapy

## 2017-02-03 ENCOUNTER — Ambulatory Visit: Payer: 59 | Admitting: Physical Therapy

## 2017-02-03 DIAGNOSIS — R262 Difficulty in walking, not elsewhere classified: Secondary | ICD-10-CM | POA: Diagnosis not present

## 2017-02-03 DIAGNOSIS — M6281 Muscle weakness (generalized): Secondary | ICD-10-CM

## 2017-02-03 DIAGNOSIS — M25562 Pain in left knee: Secondary | ICD-10-CM | POA: Diagnosis not present

## 2017-02-03 DIAGNOSIS — R600 Localized edema: Secondary | ICD-10-CM

## 2017-02-03 NOTE — Therapy (Signed)
[place], [place], [address] Phone: [phone]   Fax:  [phone]  Physical Therapy Treatment  Patient Details  Name: Dana Williams MRN: [id number] Date of Birth: 1961-04-02 Referring Provider: Kathryne Hitch, MD  Encounter Date: 02/03/2017      PT End of Session - 02/03/17 1238    Visit Number 22   Number of Visits 28   Date for PT Re-Evaluation 02/28/17   PT Start Time 5643   PT Stop Time 1250   PT Time Calculation (min) 65 min   Activity Tolerance Patient tolerated treatment well   Behavior During Therapy [facility] for tasks assessed/performed      Past Medical History:  Diagnosis Date  . Arthritis   . Depression   . GERD (gastroesophageal reflux disease)   . H/O psoriatic arthritis    DX  age 24---- in remission for 20 years  . History of hiatal hernia   . History of kidney stones   . Hyperlipidemia   . Personal history of colonic polyp - adenoma 01/29/2014  . Plantar fasciitis of left foot   . PONV (postoperative nausea and vomiting)   . Renal calculus, left   . Sigmoid diverticulosis   . Vitamin D deficiency     Past Surgical History:  Procedure Laterality Date  . CLOSED MANIPULATION SHOULDER    . COLONOSCOPY WITH PROPOFOL  01-25-2014  . CYSTO/  BILATERAL RETROGRADE PYELOGRAM/  BILATERAL URETEROSCOPIC LASER LITHOTRIPSY STONE EXTRACTIONS/  BILATERAL STENT PLACEMENT  02-13-2010  . CYSTOSCOPY WITH RETROGRADE PYELOGRAM, URETEROSCOPY AND STENT PLACEMENT Left 04/21/2015   Procedure: CYSTOSCOPY WITH RETROGRADE PYELOGRAM, URETEROSCOPY, STONE EXTRACTION AND STENT PLACEMENT;  Surgeon: Franchot Gallo, MD;  Location: [facility];  Service: Urology;  Laterality: Left;  . CYSTOURETHROSCOPY/  PLACEMENT LYNX SUPRAPUBIC SLING  05-24-2008  . HAND TENDON SURGERY Right 1993  . HOLMIUM LASER APPLICATION Left 01/14/9517   Procedure: HOLMIUM LASER APPLICATION;  Surgeon: Franchot Gallo, MD;   Location: [facility];  Service: Urology;  Laterality: Left;  . LAPAROSCOPIC CHOLECYSTECTOMY  01-31-2002  . LEFT URETEROSCOPIC STONE EXTRACTION/  STENT PLACEMENT  10-06-2005  . RHINOPLASTY  2007  . STRABISMUS SURGERY Left age 56  . TOTAL KNEE ARTHROPLASTY Left 10/27/2016   Procedure: TOTAL KNEE ARTHROPLASTY;  Surgeon: [name], MD;  Location: [place];  Service: Orthopedics;  Laterality: Left;  . WISDOM TOOTH EXTRACTION  age 56    There were no vitals filed for this visit.      Subjective Assessment - 02/03/17 1149    Subjective Just took a pain pill. My knee hurts and I can't bend it back as much.  Has worked 2 -13 hour days.    Currently in Pain? Yes   Pain Score 4    Pain Location Knee   Pain Orientation Left   Pain Descriptors / Indicators Tightness;Heaviness   Pain Type Surgical pain;Chronic pain   Pain Onset More than a month ago   Pain Frequency Intermittent   Aggravating Factors  being on it too much    Pain Relieving Factors rest, RICE, stretching             [facility] PT Assessment - 02/03/17 1158      Observation/Other Assessments-Edema    Edema Circumferential  R ankle 8 3/4 inch and L. 9 1/4 inch      Circumferential Edema   Circumferential - Right 17 1/4 inch    Circumferential - Left  18 1/2           [facility] Adult PT Treatment/Exercise - 02/03/17 1223      Knee/Hip Exercises: Stretches   Active Hamstring Stretch Left;3 reps   Knee: Self-Stretch to increase Flexion Left;3 reps   Knee: Self-Stretch Limitations 30 sec    Gastroc Stretch Left;3 reps;30 seconds   Soleus Stretch Left;3 reps;30 seconds     Knee/Hip Exercises: Aerobic   Recumbent Bike 5 min level 2 for warm up     Acupuncturist Location L knee   Electrical Stimulation Action IFC   Electrical Stimulation Parameters to Psychiatric nurse Goals Edema;Pain     Vasopneumatic   Number Minutes Vasopneumatic  15 minutes    Vasopnuematic Location  Knee   Vasopneumatic Pressure Medium   Vasopneumatic Temperature  32     Manual Therapy   Manual Therapy Passive ROM   Manual therapy comments     Joint Mobilization Gr. III flexion and ext  in supine and  ,PROM   prone    Myofascial Release quads , patellar mobilization    Passive ROM flexion , extension                PT Education - 02/03/17 1156    Education provided Yes   Education Details stretch breaks throughout the day at work, E stim , swelling    Person(s) Educated Patient   Methods Explanation   Comprehension Verbalized understanding          PT Short Term Goals - 01/17/17 1120      PT SHORT TERM GOAL #1   Title She will be independent with inital HEP   Status Achieved     PT SHORT TERM GOAL #2   Title she will improve active LT knee extension to -12 degrees to improve gait pattern   Status Achieved     PT SHORT TERM GOAL #3   Title She will improve active LT knee flexion to 108 degrees to improve gait pattern   Status Achieved     PT SHORT TERM GOAL #4   Title she will improve strength to 5/5 quads and hamstrings LT to improve gait   Status Achieved           PT Long Term Goals - 02/03/17 1240      PT LONG TERM GOAL #1   Title She will be independent with all HEP issued .    Status On-going     PT LONG TERM GOAL #2   Title She will walk stairs step ovestep with one rail safely   Status Achieved     PT LONG TERM GOAL #3   Title she will report able to be on feet for at least 4 hours to prep for return to work.    Status Achieved     PT LONG TERM GOAL #4   Title she will be able to squat for picking up 20 pounds floor to waist.    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    Status On-going               Plan - 02/03/17 1238    Clinical Impression Statement Patient has gone back to 12+ hour days and is feeling tight, fatigued and has some knee pain.  Did not have pain after manual.  Increased swelling in L knee and throughout to ankle. Advised her to stretch during the day, ice after work (RICE) and work on [employer] on her days off.     PT Next Visit Plan cont to progress ROM, PRE and vaso.  MANUAL, any less stiff after standing, working    PT Home Exercise Plan Prone hang and supine knee flexion with hip flex 90 degrees for ROM  , level 1-2 knee , ITB stretch, given lateral band walks and lunges for HEP    Consulted and Agree with Plan of Care Patient      Patient will benefit from skilled therapeutic intervention in order to improve the following deficits and impairments:  Decreased activity tolerance, Increased edema, Decreased strength, Pain, Decreased range of motion, Difficulty walking  Visit Diagnosis: Left knee pain, unspecified chronicity  Difficulty in walking, not elsewhere classified  Localized edema  Muscle weakness (generalized)     Problem List Patient Active Problem List   Diagnosis Date Noted  . Primary localized osteoarthritis of left knee 10/27/2016  . Personal history of colonic polyp - adenoma 01/29/2014  . Hyperlipidemia 04/18/2012    PAA,[name] 02/03/2017, 12:45 PM  [facility] [address] [place], [place], [address] Phone: [phone]   Fax:  [phone]  Name: MICHAELLE BOTTOMLEY MRN: [id number] Date of Birth: 04/08/1961  Raeford Razor, PT 02/03/17 12:45 PM Phone: [phone] Fax: [phone]

## 2017-02-07 ENCOUNTER — Ambulatory Visit: Payer: 59 | Admitting: Physical Therapy

## 2017-02-07 DIAGNOSIS — R262 Difficulty in walking, not elsewhere classified: Secondary | ICD-10-CM

## 2017-02-07 DIAGNOSIS — R600 Localized edema: Secondary | ICD-10-CM

## 2017-02-07 DIAGNOSIS — M25562 Pain in left knee: Secondary | ICD-10-CM

## 2017-02-07 DIAGNOSIS — M6281 Muscle weakness (generalized): Secondary | ICD-10-CM | POA: Diagnosis not present

## 2017-02-07 NOTE — Therapy (Signed)
[place], [place], [address] Phone: [phone]   Fax:  [phone]  Physical Therapy Treatment  Patient Details  Name: Dana Williams MRN: [id number] Date of Birth: [birth year] Referring Provider: Kathryne Hitch, MD  Encounter Date: 02/07/2017      PT End of Session - 02/07/17 1207    Visit Number 23   Number of Visits 28   Date for PT Re-Evaluation 02/28/17   PT Start Time 5852   PT Stop Time 1245   PT Time Calculation (min) 60 min   Activity Tolerance Patient tolerated treatment well   Behavior During Therapy [facility] for tasks assessed/performed      Past Medical History:  Diagnosis Date  . Arthritis   . Depression   . GERD (gastroesophageal reflux disease)   . H/O psoriatic arthritis    DX  age 56---- in remission for 20 years  . History of hiatal hernia   . History of kidney stones   . Hyperlipidemia   . Personal history of colonic polyp - adenoma 01/29/2014  . Plantar fasciitis of left foot   . PONV (postoperative nausea and vomiting)   . Renal calculus, left   . Sigmoid diverticulosis   . Vitamin D deficiency     Past Surgical History:  Procedure Laterality Date  . CLOSED MANIPULATION SHOULDER    . COLONOSCOPY WITH PROPOFOL  01-25-2014  . CYSTO/  BILATERAL RETROGRADE PYELOGRAM/  BILATERAL URETEROSCOPIC LASER LITHOTRIPSY STONE EXTRACTIONS/  BILATERAL STENT PLACEMENT  02-13-2010  . CYSTOSCOPY WITH RETROGRADE PYELOGRAM, URETEROSCOPY AND STENT PLACEMENT Left 04/21/2015   Procedure: CYSTOSCOPY WITH RETROGRADE PYELOGRAM, URETEROSCOPY, STONE EXTRACTION AND STENT PLACEMENT;  Surgeon: Franchot Gallo, MD;  Location: [facility];  Service: Urology;  Laterality: Left;  . CYSTOURETHROSCOPY/  PLACEMENT LYNX SUPRAPUBIC SLING  05-24-2008  . HAND TENDON SURGERY Right 1993  . HOLMIUM LASER APPLICATION Left 05/21/8241   Procedure: HOLMIUM LASER APPLICATION;  Surgeon: Franchot Gallo, MD;   Location: [facility];  Service: Urology;  Laterality: Left;  . LAPAROSCOPIC CHOLECYSTECTOMY  01-31-2002  . LEFT URETEROSCOPIC STONE EXTRACTION/  STENT PLACEMENT  10-06-2005  . RHINOPLASTY  2007  . STRABISMUS SURGERY Left age 56  . TOTAL KNEE ARTHROPLASTY Left 10/27/2016   Procedure: TOTAL KNEE ARTHROPLASTY;  Surgeon: [name], MD;  Location: [place];  Service: Orthopedics;  Laterality: Left;  . WISDOM TOOTH EXTRACTION  age 56    There were no vitals filed for this visit.      Subjective Assessment - 02/07/17 1148    Subjective Didn't do a whole lot this weekend.  I took it easy.  No pain, just a little tight.  Pt with less pain after last treatment due to IFC.    Currently in Pain? No/denies              [facility] Adult PT Treatment/Exercise - 02/07/17 0001      Knee/Hip Exercises: Aerobic   Stationary Bike 6 min prior to PT      Knee/Hip Exercises: Machines for Strengthening   Other Machine Pilates Reformer: used platform for base: Footwork  double and single leg   light spring 1 red 1 blue"jump" , bridge all springs     Acupuncturist Location L knee   Electrical Stimulation Action IFC   Electrical Stimulation Parameters to Adult nurse Goals Edema;Pain     Vasopneumatic   Number Minutes Vasopneumatic  15 minutes   Vasopnuematic Location  Knee   Vasopneumatic Pressure Medium   Vasopneumatic Temperature  32     Manual Therapy   Manual therapy comments contract relax for flexion    Joint Mobilization Gr. III flexion and ext  in supine and  ,PROM   prone    Passive ROM flexion , extension     Pilates Reformer used for LE/core strength, postural strength, lumbopelvic disassociation and core control.            PT Education - 02/07/17 1326    Education provided Yes   Education Details jumping on Reformer   Person(s) Educated Patient   Methods Explanation   Comprehension Verbalized  understanding          PT Short Term Goals - 01/17/17 1120      PT SHORT TERM GOAL #1   Title She will be independent with inital HEP   Status Achieved     PT SHORT TERM GOAL #2   Title she will improve active LT knee extension to -12 degrees to improve gait pattern   Status Achieved     PT SHORT TERM GOAL #3   Title She will improve active LT knee flexion to 108 degrees to improve gait pattern   Status Achieved     PT SHORT TERM GOAL #4   Title she will improve strength to 5/5 quads and hamstrings LT to improve gait   Status Achieved           PT Long Term Goals - 02/03/17 1240      PT LONG TERM GOAL #1   Title She will be independent with all HEP issued .    Status On-going     PT LONG TERM GOAL #2   Title She will walk stairs step ovestep with one rail safely   Status Achieved     PT LONG TERM GOAL #3   Title she will report able to be on feet for at least 4 hours to prep for return to work.    Status Achieved     PT LONG TERM GOAL #4   Title she will be able to squat for picking up 20 pounds floor to waist.    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    Status On-going               Plan - 02/07/17 1327    Clinical Impression Statement Swelling improved.        Patient will benefit from skilled therapeutic intervention in order to improve the following deficits and impairments:     Visit Diagnosis: Left knee pain, unspecified chronicity  Difficulty in walking, not elsewhere classified  Localized edema  Muscle weakness  (generalized)     Problem List Patient Active Problem List   Diagnosis Date Noted  . Primary localized osteoarthritis of left knee 10/27/2016  . Personal  history of colonic polyp - adenoma 01/29/2014  . Hyperlipidemia 04/18/2012    PAA,[name] 02/07/2017, 1:29 PM  [facility] [address] [place], [place], [address] Phone: [phone]   Fax:  [phone]  Name: Dana Williams MRN: [id number] Date of Birth: Aug 09, 1961  Raeford Razor, PT 02/07/17 1:30 PM Phone: [phone] Fax: [phone]

## 2017-02-08 ENCOUNTER — Encounter: Payer: 59 | Admitting: Physical Therapy

## 2017-02-10 ENCOUNTER — Encounter: Payer: 59 | Admitting: Physical Therapy

## 2017-02-10 ENCOUNTER — Ambulatory Visit: Payer: 59 | Admitting: Physical Therapy

## 2017-02-10 DIAGNOSIS — M25562 Pain in left knee: Secondary | ICD-10-CM | POA: Diagnosis not present

## 2017-02-10 DIAGNOSIS — R262 Difficulty in walking, not elsewhere classified: Secondary | ICD-10-CM

## 2017-02-10 DIAGNOSIS — R600 Localized edema: Secondary | ICD-10-CM | POA: Diagnosis not present

## 2017-02-10 DIAGNOSIS — M6281 Muscle weakness (generalized): Secondary | ICD-10-CM

## 2017-02-10 NOTE — Therapy (Signed)
Dana Williams, [place], [address] Phone: [phone]   Fax:  [phone]  Physical Therapy Treatment  Patient Details  Name: Dana Williams MRN: [id number] Date of Birth: 08-19-1961 Referring Provider: Kathryne Hitch, MD  Encounter Date: 02/10/2017      PT End of Session - 02/10/17 1557    Visit Number 24   Number of Visits 28   Date for PT Re-Evaluation 02/28/17   PT Start Time 8338   PT Stop Time 1639   PT Time Calculation (min) 44 min   Activity Tolerance Patient tolerated treatment well   Behavior During Therapy [facility] for tasks assessed/performed      Past Medical History:  Diagnosis Date  . Arthritis   . Depression   . GERD (gastroesophageal reflux disease)   . H/O psoriatic arthritis    DX  age 24---- in remission for 20 years  . History of hiatal hernia   . History of kidney stones   . Hyperlipidemia   . Personal history of colonic polyp - adenoma 01/29/2014  . Plantar fasciitis of left foot   . PONV (postoperative nausea and vomiting)   . Renal calculus, left   . Sigmoid diverticulosis   . Vitamin D deficiency     Past Surgical History:  Procedure Laterality Date  . CLOSED MANIPULATION SHOULDER    . COLONOSCOPY WITH PROPOFOL  01-25-2014  . CYSTO/  BILATERAL RETROGRADE PYELOGRAM/  BILATERAL URETEROSCOPIC LASER LITHOTRIPSY STONE EXTRACTIONS/  BILATERAL STENT PLACEMENT  02-13-2010  . CYSTOSCOPY WITH RETROGRADE PYELOGRAM, URETEROSCOPY AND STENT PLACEMENT Left 04/21/2015   Procedure: CYSTOSCOPY WITH RETROGRADE PYELOGRAM, URETEROSCOPY, STONE EXTRACTION AND STENT PLACEMENT;  Surgeon: Dana Gallo, MD;  Location: [facility];  Service: Urology;  Laterality: Left;  . CYSTOURETHROSCOPY/  PLACEMENT LYNX SUPRAPUBIC SLING  05-24-2008  . HAND TENDON SURGERY Right 1993  . HOLMIUM LASER APPLICATION Left 12/20/537   Procedure: HOLMIUM LASER APPLICATION;  Surgeon: Dana Gallo, MD;   Location: [facility];  Service: Urology;  Laterality: Left;  . LAPAROSCOPIC CHOLECYSTECTOMY  01-31-2002  . LEFT URETEROSCOPIC STONE EXTRACTION/  STENT PLACEMENT  10-06-2005  . RHINOPLASTY  2007  . STRABISMUS SURGERY Left age 43  . TOTAL KNEE ARTHROPLASTY Left 10/27/2016   Procedure: TOTAL KNEE ARTHROPLASTY;  Surgeon: Dana Lights, MD;  Location: [place];  Service: Orthopedics;  Laterality: Left;  . WISDOM TOOTH EXTRACTION  age 9    There were no vitals filed for this visit.      Subjective Assessment - 02/10/17 1553    Subjective Been busy couple of days! knee hurting    Currently in Pain? Yes   Pain Score 3    Pain Location Knee   Pain Orientation Left   Pain Descriptors / Indicators Tightness   Pain Type Chronic pain;Surgical pain   Pain Onset More than a month ago   Pain Frequency Intermittent            [facility] PT Assessment - 02/10/17 1627      Circumferential Edema   Circumferential - Right 17.5   Circumferential - Left  18     PROM   Left Knee Extension 5   Left Knee Flexion 116            [facility] Adult PT Treatment/Exercise - 02/10/17 1605      Knee/Hip Exercises: Stretches   Active Hamstring Stretch Left;3 reps   Knee: Self-Stretch to increase Flexion Left;3 reps  Knee: Self-Stretch Limitations 30 sec      Knee/Hip Exercises: Aerobic   Stationary Bike 6 min prior to PT      Knee/Hip Exercises: Standing   Other Standing Knee Exercises front lunge, side lunge x 10- each leg cues to remain upright and bend knee, use hip    Other Standing Knee Exercises standing hip hinge with yellow band x 10 each side     Vasopneumatic   Number Minutes Vasopneumatic  15 minutes   Vasopnuematic Location  Knee   Vasopneumatic Pressure Medium   Vasopneumatic Temperature  32     Manual Therapy   Manual therapy comments contract relax for flexion    Joint Mobilization Gr. III flexion and ext  in supine and  ,PROM   prone    Passive ROM flexion ,  extension                PT Education - 02/10/17 1629    Education provided Yes   Education Details ROM    Person(s) Educated Patient   Methods Explanation   Comprehension Verbalized understanding          PT Short Term Goals - 01/17/17 1120      PT SHORT TERM GOAL #1   Title She will be independent with inital HEP   Status Achieved     PT SHORT TERM GOAL #2   Title she will improve active LT knee extension to -12 degrees to improve gait pattern   Status Achieved     PT SHORT TERM GOAL #3   Title She will improve active LT knee flexion to 108 degrees to improve gait pattern   Status Achieved     PT SHORT TERM GOAL #4   Title she will improve strength to 5/5 quads and hamstrings LT to improve gait   Status Achieved           PT Long Term Goals - 02/10/17 1631      PT LONG TERM GOAL #7   Title Pt will be able to work for a full work day and report pain <5/10 which she can control with meds, ice and positioning.    Baseline trending towards improvement    Status On-going               Plan - 02/10/17 1630    Clinical Impression Statement Patient exhausted after working long days. She cont with lateral knee pain, plans to stretch this weekend and rest!    PT Next Visit Plan cont to progress ROM, PRE and vaso.  MANUAL, any less stiff after standing, working    PT Home Exercise Plan Prone hang and supine knee flexion with hip flex 90 degrees for ROM  , level 1-2 knee , ITB stretch, given lateral band walks and lunges for HEP    Consulted and Agree with Plan of Care Patient      Patient will benefit from skilled therapeutic intervention in order to improve the following deficits and impairments:  Decreased activity tolerance, Increased edema, Decreased strength, Pain, Decreased range of motion, Difficulty walking  Visit Diagnosis: Left knee pain, unspecified chronicity  Difficulty in walking, not elsewhere classified  Localized edema  Muscle  weakness (generalized)     Problem List Patient Active Problem List   Diagnosis Date Noted  . Primary localized osteoarthritis of left knee 10/27/2016  . Personal history of colonic polyp - adenoma 01/29/2014  . Hyperlipidemia 04/18/2012    Dana Williams 02/10/2017, 4:33 PM  [place], [place], [address] Phone: [phone]   Fax:  [phone]  Name: Dana Williams MRN: [id number] Date of Birth: Oct 10, 1961  Raeford Razor, PT 02/10/17 4:34 PM Phone: [phone] Fax: [phone]

## 2017-02-15 ENCOUNTER — Ambulatory Visit: Payer: 59 | Admitting: Physical Therapy

## 2017-02-17 ENCOUNTER — Ambulatory Visit: Payer: 59 | Admitting: Physical Therapy

## 2017-02-22 ENCOUNTER — Ambulatory Visit: Payer: 59 | Admitting: Physical Therapy

## 2017-02-23 ENCOUNTER — Encounter: Payer: 59 | Admitting: Physical Therapy

## 2017-02-24 ENCOUNTER — Telehealth: Payer: Self-pay | Admitting: Physical Therapy

## 2017-02-24 ENCOUNTER — Ambulatory Visit: Payer: 59 | Admitting: Physical Therapy

## 2017-02-24 NOTE — Telephone Encounter (Signed)
This PT called the patient to notify her that her last scheduled session was today at 1:30. I left a voicemail.  She has missed her last few appts due to being unable to leave work.  I asked that she call me back to discuss options for her from this point.  Will wait to speak with her before discharging.

## 2017-03-08 ENCOUNTER — Ambulatory Visit: Payer: 59 | Attending: Orthopedic Surgery | Admitting: Physical Therapy

## 2017-03-08 DIAGNOSIS — R262 Difficulty in walking, not elsewhere classified: Secondary | ICD-10-CM | POA: Diagnosis present

## 2017-03-08 DIAGNOSIS — M25562 Pain in left knee: Secondary | ICD-10-CM | POA: Diagnosis present

## 2017-03-08 DIAGNOSIS — R600 Localized edema: Secondary | ICD-10-CM | POA: Insufficient documentation

## 2017-03-08 DIAGNOSIS — M6281 Muscle weakness (generalized): Secondary | ICD-10-CM | POA: Insufficient documentation

## 2017-03-08 NOTE — Therapy (Signed)
[place], [place], [address] Phone: [phone]   Fax:  [phone]  Physical Therapy Treatment  Patient Details  Name: IZZIE Williams MRN: [id number] Date of Birth: [birth year] Referring Provider: Kathryne Hitch, MD  Encounter Date: 03/08/2017      PT End of Session - 03/08/17 1151    Visit Number 25   Number of Visits 31   Date for PT Re-Evaluation 04/15/17   PT Start Time 5176   PT Stop Time 1240   PT Time Calculation (min) 55 min   Activity Tolerance Patient tolerated treatment well   Behavior During Therapy [facility] for tasks assessed/performed      Past Medical History:  Diagnosis Date  . Arthritis   . Depression   . GERD (gastroesophageal reflux disease)   . H/O psoriatic arthritis    DX  age 50---- in remission for 20 years  . History of hiatal hernia   . History of kidney stones   . Hyperlipidemia   . Personal history of colonic polyp - adenoma 01/29/2014  . Plantar fasciitis of left foot   . PONV (postoperative nausea and vomiting)   . Renal calculus, left   . Sigmoid diverticulosis   . Vitamin D deficiency     Past Surgical History:  Procedure Laterality Date  . CLOSED MANIPULATION SHOULDER    . COLONOSCOPY WITH PROPOFOL  01-25-2014  . CYSTO/  BILATERAL RETROGRADE PYELOGRAM/  BILATERAL URETEROSCOPIC LASER LITHOTRIPSY STONE EXTRACTIONS/  BILATERAL STENT PLACEMENT  02-13-2010  . CYSTOSCOPY WITH RETROGRADE PYELOGRAM, URETEROSCOPY AND STENT PLACEMENT Left 04/21/2015   Procedure: CYSTOSCOPY WITH RETROGRADE PYELOGRAM, URETEROSCOPY, STONE EXTRACTION AND STENT PLACEMENT;  Surgeon: Franchot Gallo, MD;  Location: [facility];  Service: Urology;  Laterality: Left;  . CYSTOURETHROSCOPY/  PLACEMENT LYNX SUPRAPUBIC SLING  05-24-2008  . HAND TENDON SURGERY Right 1993  . HOLMIUM LASER APPLICATION Left 11/21/735   Procedure: HOLMIUM LASER APPLICATION;  Surgeon: Franchot Gallo, MD;   Location: [facility];  Service: Urology;  Laterality: Left;  . LAPAROSCOPIC CHOLECYSTECTOMY  01-31-2002  . LEFT URETEROSCOPIC STONE EXTRACTION/  STENT PLACEMENT  10-06-2005  . RHINOPLASTY  2007  . STRABISMUS SURGERY Left age 66  . TOTAL KNEE ARTHROPLASTY Left 10/27/2016   Procedure: TOTAL KNEE ARTHROPLASTY;  Surgeon: [name], MD;  Location: [place];  Service: Orthopedics;  Laterality: Left;  . WISDOM TOOTH EXTRACTION  age 71    There were no vitals filed for this visit.      Subjective Assessment - 03/08/17 1149    Subjective Has not been here in 3 weeks.  Struggling with her work schedule.  The leg feels tight, especially when I sit for too long.  I try and get up and walk every hour.    Currently in Pain? No/denies            [facility] PT Assessment - 03/08/17 1150      PROM   Left Knee Flexion 112     Strength   Right Knee Flexion 5/5   Right Knee Extension 5/5   Left Knee Flexion 5/5   Left Knee Extension 5/5             [facility] Adult PT Treatment/Exercise - 03/08/17 1156      Knee/Hip Exercises: Stretches   Knee: Self-Stretch to increase Flexion Left;5 reps;30 seconds     Knee/Hip Exercises: Standing   Knee Flexion AROM;AAROM;Left;1 set;5 sets   Forward Lunges Left;1  set;10 reps   Forward Lunges Limitations dynamic stretching    Step Down Left;1 set;15 reps;Hand Hold: 1;Hand Hold: 0;Step Height: 6";Step Height: 8"     Knee/Hip Exercises: Seated   Knee/Hip Flexion flexion hold for measuring to 112 deg      Knee/Hip Exercises: Prone   Other Prone Exercises knee flexion with contract-relax x 10 for 10 sec hold      Cryotherapy   Number Minutes Cryotherapy 10 Minutes   Cryotherapy Location Knee   Type of Cryotherapy Ice pack     Manual Therapy   Manual therapy comments contract relax for flexion    Joint Mobilization Gr. III flexion and ext  in supine and  ,PROM   prone    Soft tissue mobilization used roller to L quads and ITB     Passive ROM flexion , extension                  PT Short Term Goals - 03/08/17 1239      PT SHORT TERM GOAL #1   Title She will be independent with inital HEP   Status Achieved     PT SHORT TERM GOAL #2   Title she will improve active LT knee extension to -12 degrees to improve gait pattern   Status Achieved     PT SHORT TERM GOAL #3   Title She will improve active LT knee flexion to 108 degrees to improve gait pattern   Status Achieved     PT SHORT TERM GOAL #4   Title she will improve strength to 5/5 quads and hamstrings LT to improve gait   Status Achieved           PT Long Term Goals - 03/08/17 1214      PT LONG TERM GOAL #1   Title She will be independent with all HEP issued .    Status On-going     PT LONG TERM GOAL #2   Title She will walk stairs step ovestep with one rail safely   Status Achieved     PT LONG TERM GOAL #3   Title she will report able to be on feet for at least 4 hours to prep for return to work.    Status Achieved     PT LONG TERM GOAL #4   Title she will be able to squat for picking up 20 pounds floor to waist.    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    Baseline more tight than pain, progresses throughout the day    Status Partially Met     PT LONG TERM GOAL #8   Title Pt will improve ROM to 116 deg for her to be able to kneel, bend and transfer without pain    Time 6   Period Weeks   Status New     PT LONG TERM GOAL  #9  TITLE Pt will perform eccentric LE exercises without pain and with good control.    Time  6   Period Weeks   Status New               Plan - 03/08/17 1240    Clinical Impression Statement Patient was renewed today because she has missed 3 weeks of PT and has lost some AROM.  She has not been completely diligent with doing prescribed HEP.  Focus on manual and stretching when she comes to PT (until she ses the MD)    Rehab Potential Excellent   PT Frequency 1x / week   PT Duration 6 weeks   PT Treatment/Interventions Cryotherapy;Electrical Stimulation;Stair training;Passive range of motion;Patient/family education;Therapeutic exercise;Taping;Manual techniques;Gait training   PT Next Visit Plan cont to progress ROM, PRE and vaso.  MANUAL, any less stiff after standing, working    PT Home Exercise Plan Prone hang and supine knee flexion with hip flex 90 degrees for ROM  , level 1-2 knee , ITB stretch, given lateral band walks and lunges for HEP    Consulted and Agree with Plan of Care Patient      Patient will benefit from skilled therapeutic intervention in order to improve the following deficits and impairments:  Decreased activity tolerance, Increased edema, Decreased strength, Pain, Decreased range of motion, Difficulty walking  Visit Diagnosis: Left knee pain, unspecified chronicity  Difficulty in walking, not elsewhere classified  Localized edema  Muscle weakness (generalized)     Problem List Patient Active Problem List   Diagnosis Date Noted  . Primary localized osteoarthritis of left knee 10/27/2016  . Personal history of colonic polyp - adenoma 01/29/2014  . Hyperlipidemia 04/18/2012    PAA,[name] 03/08/2017, 12:42 PM  [facility] [address] [place], [place], [address] Phone: [phone]   Fax:  [phone]  Name: Dana Williams MRN: [id number] Date of Birth: May 09, 1961  Raeford Razor, PT 03/08/17 1:27 PM Phone: [phone] Fax: [phone]

## 2017-03-15 ENCOUNTER — Encounter: Payer: Self-pay | Admitting: Physical Therapy

## 2017-03-15 ENCOUNTER — Ambulatory Visit: Payer: 59 | Attending: Orthopedic Surgery | Admitting: Physical Therapy

## 2017-03-15 DIAGNOSIS — R262 Difficulty in walking, not elsewhere classified: Secondary | ICD-10-CM | POA: Diagnosis present

## 2017-03-15 DIAGNOSIS — R600 Localized edema: Secondary | ICD-10-CM | POA: Insufficient documentation

## 2017-03-15 DIAGNOSIS — M6281 Muscle weakness (generalized): Secondary | ICD-10-CM | POA: Insufficient documentation

## 2017-03-15 DIAGNOSIS — M25562 Pain in left knee: Secondary | ICD-10-CM | POA: Insufficient documentation

## 2017-03-15 NOTE — Therapy (Addendum)
[place], [place], [address] Phone: [phone]   Fax:  [phone]  Physical Therapy Treatment and Discharge Patient Details  Name: Dana Williams MRN: [id number] Date of Birth: [birth year] Referring Provider: Kathryne Hitch, MD  Encounter Date: 03/15/2017      PT End of Session - 03/15/17 1234    Visit Number 26   Number of Visits 31   Date for PT Re-Evaluation 04/15/17   PT Start Time 3474   PT Stop Time 1240   PT Time Calculation (min) 53 min   Activity Tolerance Patient tolerated treatment well   Behavior During Therapy [facility] for tasks assessed/performed      Past Medical History:  Diagnosis Date  . Arthritis   . Depression   . GERD (gastroesophageal reflux disease)   . H/O psoriatic arthritis    DX  age 69---- in remission for 20 years  . History of hiatal hernia   . History of kidney stones   . Hyperlipidemia   . Personal history of colonic polyp - adenoma 01/29/2014  . Plantar fasciitis of left foot   . PONV (postoperative nausea and vomiting)   . Renal calculus, left   . Sigmoid diverticulosis   . Vitamin D deficiency     Past Surgical History:  Procedure Laterality Date  . CLOSED MANIPULATION SHOULDER    . COLONOSCOPY WITH PROPOFOL  01-25-2014  . CYSTO/  BILATERAL RETROGRADE PYELOGRAM/  BILATERAL URETEROSCOPIC LASER LITHOTRIPSY STONE EXTRACTIONS/  BILATERAL STENT PLACEMENT  02-13-2010  . CYSTOSCOPY WITH RETROGRADE PYELOGRAM, URETEROSCOPY AND STENT PLACEMENT Left 04/21/2015   Procedure: CYSTOSCOPY WITH RETROGRADE PYELOGRAM, URETEROSCOPY, STONE EXTRACTION AND STENT PLACEMENT;  Surgeon: Franchot Gallo, MD;  Location: [facility];  Service: Urology;  Laterality: Left;  . CYSTOURETHROSCOPY/  PLACEMENT LYNX SUPRAPUBIC SLING  05-24-2008  . HAND TENDON SURGERY Right 1993  . HOLMIUM LASER APPLICATION Left 12/20/9561   Procedure: HOLMIUM LASER APPLICATION;  Surgeon: Franchot Gallo, MD;  Location: [facility];  Service: Urology;  Laterality: Left;  . LAPAROSCOPIC CHOLECYSTECTOMY  01-31-2002  . LEFT URETEROSCOPIC STONE EXTRACTION/  STENT PLACEMENT  10-06-2005  . RHINOPLASTY  2007  . STRABISMUS SURGERY Left age 52  . TOTAL KNEE ARTHROPLASTY Left 10/27/2016   Procedure: TOTAL KNEE ARTHROPLASTY;  Surgeon: [name], MD;  Location: [place];  Service: Orthopedics;  Laterality: Left;  . WISDOM TOOTH EXTRACTION  age 69    There were no vitals filed for this visit.      Subjective Assessment - 03/15/17 1150    Subjective Sees MD early June.  Still tight.  I lost 3 lbs.  I'm back to walking and I even walked last night after working a 12 hour shift.    Currently in Pain? No/denies   Pain Location Knee   Pain Orientation Left   Pain Descriptors / Indicators Tightness   Pain Type Chronic pain;Surgical pain   Pain Onset More than a month ago   Pain Frequency Intermittent   Aggravating Factors  pressing on it    Pain Relieving Factors rest, RICE, stretching             [facility] PT Assessment - 03/15/17 1224      AROM   Left Knee Flexion 105     PROM   Left Knee Extension 4   Left Knee Flexion 120            [facility] Adult PT Treatment/Exercise - 03/15/17  1156      Self-Care   Other Self-Care Comments  self mob in knees with towel      Knee/Hip Exercises: Stretches   Active Hamstring Stretch Left;3 reps   Knee: Self-Stretch to increase Flexion Left;3 reps   Knee: Self-Stretch Limitations 30 sec    Other Knee/Hip Stretches ITB x 30 sec x 3      Knee/Hip Exercises: Aerobic   Stationary Bike 6 min for warm up     Knee/Hip Exercises: Standing   Forward Lunges Left;1 set;10 reps   Forward Lunges Limitations dynamic stretching      Knee/Hip Exercises: Prone   Other Prone Exercises knee flexion with contract-relax x 10 for 10 sec hold      Cryotherapy   Number Minutes Cryotherapy 10 Minutes   Cryotherapy Location Knee   Type  of Cryotherapy Ice pack     Manual Therapy   Joint Mobilization Gr. III-IV flexion and ext  in prone and  supine and  ,PROM   prone    Soft tissue mobilization post glide to tibia with lunging foot on step, mob with movement   Passive ROM flexion , extension                PT Education - 03/15/17 1234    Education provided Yes   Education Details self mob for flexion    Person(s) Educated Patient   Methods Explanation   Comprehension Verbalized understanding;Returned demonstration          PT Short Term Goals - 03/08/17 1239      PT SHORT TERM GOAL #1   Title She will be independent with inital HEP   Status Achieved     PT SHORT TERM GOAL #2   Title she will improve active LT knee extension to -12 degrees to improve gait pattern   Status Achieved     PT SHORT TERM GOAL #3   Title She will improve active LT knee flexion to 108 degrees to improve gait pattern   Status Achieved     PT SHORT TERM GOAL #4   Title she will improve strength to 5/5 quads and hamstrings LT to improve gait   Status Achieved           PT Long Term Goals - 03/15/17 1234      PT LONG TERM GOAL #1   Title She will be independent with all HEP issued .    Status Achieved     PT LONG TERM GOAL #2   Title She will walk stairs step ovestep with one rail safely   Status Achieved     PT LONG TERM GOAL #3   Title she will report able to be on feet for at least 4 hours to prep for return to work.    Status Achieved     PT LONG TERM GOAL #4   Title she will be able to squat for picking up 20 pounds floor to waist.    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    Status Achieved     PT LONG TERM GOAL #8   Title Pt will improve ROM to 116 deg for her to be able to kneel, bend and transfer without pain    Baseline PROM to 120 deg    Status On-going     PT LONG TERM GOAL  #9   TITLE Pt will perform eccentric LE exercises without pain and with good control.    Status Unable to assess               Plan - 03/15/17 1326    Clinical Impression Statement Able to push knee to 120 deg flexion.  More active, focused on numbers/degrees  but I focused on the positive of her integrating stretching into daily work and ADLs.  Best ROM in extension 3-4 deg.  May call PA with concerns about her plateau of ROM.    PT Next Visit Plan cont to progress ROM, PRE and vaso.  MANUAL, any less stiff after standing, working    PT Home Exercise Plan Prone hang and supine knee flexion with hip flex 90 degrees for ROM  , level 1-2 knee , ITB stretch, given lateral band walks and lunges for HEP    Consulted and Agree with Plan of Care Patient      Patient will benefit from skilled therapeutic intervention in order to improve the following deficits and impairments:  Decreased activity tolerance, Increased edema, Decreased strength, Pain, Decreased range of motion, Difficulty walking  Visit Diagnosis: Left knee pain, unspecified chronicity  Difficulty in walking, not elsewhere classified  Localized edema  Muscle weakness (generalized)     Problem List Patient Active Problem List   Diagnosis Date Noted  . Primary localized osteoarthritis of left knee 10/27/2016  . Personal history of colonic polyp - adenoma 01/29/2014  . Hyperlipidemia 04/18/2012    PAA,[name] 03/15/2017, 1:28 PM  [facility] [address] [place], [place], [address] Phone: [phone]   Fax:  [phone]  Name: Dana Williams MRN: [id number] Date  of Birth: 1960/11/29   Raeford Razor, PT 03/15/17 1:28 PM Phone: [phone] Fax: [phone]   PHYSICAL THERAPY DISCHARGE SUMMARY  Visits from Start of Care: 26  Current functional level related to goals / functional outcomes: See above for moat recent info   Remaining deficits: ROM    Education / Equipment: [employer] on HEP, gym exercise, ROM  , RICE  Plan: Patient agrees to discharge.  Patient goals were partially met. Patient is being discharged due to not returning since the last visit.  ?????    Missed several appts due to work obligations.  She is scheduled for a manipulation in the Fall 2018, she will need a new Referral to return.   Raeford Razor, PT 04/26/17 2:39 PM Phone: [phone] Fax: [phone]

## 2017-03-29 ENCOUNTER — Telehealth: Payer: Self-pay | Admitting: Physical Therapy

## 2017-03-29 ENCOUNTER — Encounter: Payer: Self-pay | Admitting: Physical Therapy

## 2017-03-29 ENCOUNTER — Ambulatory Visit: Payer: 59 | Admitting: Physical Therapy

## 2017-03-29 NOTE — Telephone Encounter (Signed)
Called patient to remind her of her 11:45 appt. That she missed.  I reminded her of her next 2 appts.  She was asked to call if she was unable to come to her next appts.

## 2017-04-05 MED FILL — AMMONIUM LACTATE 12% LOTION: 12 | 30 days supply | Qty: 800 | Fill #3

## 2017-04-07 ENCOUNTER — Ambulatory Visit: Payer: 59 | Admitting: Physical Therapy

## 2017-04-14 ENCOUNTER — Ambulatory Visit: Payer: 59 | Admitting: Physical Therapy

## 2017-04-18 DIAGNOSIS — Z13228 Encounter for screening for other metabolic disorders: Secondary | ICD-10-CM | POA: Diagnosis not present

## 2017-04-18 DIAGNOSIS — Z1231 Encounter for screening mammogram for malignant neoplasm of breast: Secondary | ICD-10-CM | POA: Diagnosis not present

## 2017-04-18 DIAGNOSIS — Z13220 Encounter for screening for lipoid disorders: Secondary | ICD-10-CM | POA: Diagnosis not present

## 2017-04-18 DIAGNOSIS — Z01419 Encounter for gynecological examination (general) (routine) without abnormal findings: Secondary | ICD-10-CM | POA: Diagnosis not present

## 2017-04-18 DIAGNOSIS — Z6835 Body mass index (BMI) 35.0-35.9, adult: Secondary | ICD-10-CM | POA: Diagnosis not present

## 2017-04-19 DIAGNOSIS — M1712 Unilateral primary osteoarthritis, left knee: Secondary | ICD-10-CM | POA: Diagnosis not present

## 2017-05-11 DIAGNOSIS — L718 Other rosacea: Secondary | ICD-10-CM | POA: Diagnosis not present

## 2017-05-11 DIAGNOSIS — D1801 Hemangioma of skin and subcutaneous tissue: Secondary | ICD-10-CM | POA: Diagnosis not present

## 2017-05-11 DIAGNOSIS — D225 Melanocytic nevi of trunk: Secondary | ICD-10-CM | POA: Diagnosis not present

## 2017-05-11 DIAGNOSIS — D2271 Melanocytic nevi of right lower limb, including hip: Secondary | ICD-10-CM | POA: Diagnosis not present

## 2017-05-11 DIAGNOSIS — Z85828 Personal history of other malignant neoplasm of skin: Secondary | ICD-10-CM | POA: Diagnosis not present

## 2017-05-11 DIAGNOSIS — D2261 Melanocytic nevi of right upper limb, including shoulder: Secondary | ICD-10-CM | POA: Diagnosis not present

## 2017-05-11 DIAGNOSIS — L400 Psoriasis vulgaris: Secondary | ICD-10-CM | POA: Diagnosis not present

## 2017-05-11 DIAGNOSIS — L814 Other melanin hyperpigmentation: Secondary | ICD-10-CM | POA: Diagnosis not present

## 2017-05-11 DIAGNOSIS — L853 Xerosis cutis: Secondary | ICD-10-CM | POA: Diagnosis not present

## 2017-05-19 MED FILL — AMMONIUM LACTATE 12% LOTION: 12 | 30 days supply | Qty: 800 | Fill #0

## 2017-06-03 MED FILL — NORETHIN-ETH ESTRAD 1 MG-5: 1-5 | 84 days supply | Qty: 84 | Fill #0

## 2017-07-05 DIAGNOSIS — M1712 Unilateral primary osteoarthritis, left knee: Secondary | ICD-10-CM | POA: Diagnosis not present

## 2017-07-21 ENCOUNTER — Ambulatory Visit (HOSPITAL_BASED_OUTPATIENT_CLINIC_OR_DEPARTMENT_OTHER): Admission: RE | Admit: 2017-07-21 | Payer: 59 | Source: Ambulatory Visit | Admitting: Orthopedic Surgery

## 2017-07-21 ENCOUNTER — Encounter (HOSPITAL_BASED_OUTPATIENT_CLINIC_OR_DEPARTMENT_OTHER): Admission: RE | Payer: Self-pay | Source: Ambulatory Visit

## 2017-07-21 SURGERY — CLOSED MANIPULATION KNEE WITH STEROID INJECTION
Anesthesia: General | Site: Knee | Laterality: Left

## 2017-08-04 MED FILL — ESOMEPRAZOLE MAG DR 40 MG C: 40 | 90 days supply | Qty: 90 | Fill #1

## 2017-08-04 MED FILL — BELVIQ 10 MG TABLET: 10 | 30 days supply | Qty: 60 | Fill #0

## 2017-08-25 MED FILL — DOXYCYCLINE HYC 100 MG TAB: 100 | 30 days supply | Qty: 30 | Fill #0

## 2017-09-08 MED FILL — AMMONIUM LACTATE 12% LOTION: 12 | 30 days supply | Qty: 800 | Fill #1

## 2017-09-16 DIAGNOSIS — H52223 Regular astigmatism, bilateral: Secondary | ICD-10-CM | POA: Diagnosis not present

## 2017-09-16 DIAGNOSIS — H524 Presbyopia: Secondary | ICD-10-CM | POA: Diagnosis not present

## 2017-09-16 DIAGNOSIS — H5213 Myopia, bilateral: Secondary | ICD-10-CM | POA: Diagnosis not present

## 2017-09-28 MED FILL — NORETHIN-ETH ESTRAD 1 MG-5: 1-5 | 84 days supply | Qty: 84 | Fill #1

## 2017-10-31 MED FILL — ESOMEPRAZOLE MAG DR 40 MG C: 40 | 90 days supply | Qty: 90 | Fill #0

## 2017-11-22 DIAGNOSIS — E7800 Pure hypercholesterolemia, unspecified: Secondary | ICD-10-CM | POA: Diagnosis not present

## 2017-11-29 MED FILL — ROSUVASTATIN CALCIUM 10 MG: 10 | 90 days supply | Qty: 90 | Fill #0

## 2017-12-12 MED FILL — FINACEA 15% FOAM: 15 | 30 days supply | Qty: 50 | Fill #0

## 2017-12-22 MED FILL — AMMONIUM LACTATE 12% LOTION: 12 | 30 days supply | Qty: 800 | Fill #2

## 2017-12-22 MED FILL — AZELAIC ACID 15 % GEL: 15 | 30 days supply | Qty: 50 | Fill #0

## 2018-01-23 MED FILL — NORETHIN-ETH ESTRAD 1 MG-5: 1-5 | 84 days supply | Qty: 84 | Fill #2

## 2018-01-30 MED FILL — ESOMEPRAZOLE MAG DR 40 MG C: 40 | 90 days supply | Qty: 90 | Fill #0

## 2018-02-21 MED FILL — valACYclovir HCL 1 GM TABS: 1 | 5 days supply | Qty: 10 | Fill #0

## 2018-02-27 DIAGNOSIS — H00021 Hordeolum internum right upper eyelid: Secondary | ICD-10-CM | POA: Diagnosis not present

## 2018-02-27 MED FILL — ROSUVASTATIN CALCIUM 10 MG: 10 | 90 days supply | Qty: 90 | Fill #1

## 2018-02-27 MED FILL — CEPHALEXIN 500 MG CAPSULE: 500 | 10 days supply | Qty: 20 | Fill #0

## 2018-03-10 DIAGNOSIS — H0011 Chalazion right upper eyelid: Secondary | ICD-10-CM | POA: Diagnosis not present

## 2018-03-10 DIAGNOSIS — H5212 Myopia, left eye: Secondary | ICD-10-CM | POA: Diagnosis not present

## 2018-03-10 DIAGNOSIS — H52222 Regular astigmatism, left eye: Secondary | ICD-10-CM | POA: Diagnosis not present

## 2018-03-10 MED FILL — FLUCONAZOLE 150 MG TABS: 150 | 3 days supply | Qty: 2 | Fill #0

## 2018-03-30 ENCOUNTER — Ambulatory Visit (INDEPENDENT_AMBULATORY_CARE_PROVIDER_SITE_OTHER): Payer: Self-pay | Admitting: Family Medicine

## 2018-03-30 VITALS — BP 130/90 | HR 88 | Temp 98.9°F | Resp 17 | Wt 231.4 lb

## 2018-03-30 DIAGNOSIS — R05 Cough: Secondary | ICD-10-CM

## 2018-03-30 DIAGNOSIS — R059 Cough, unspecified: Secondary | ICD-10-CM

## 2018-03-30 MED ORDER — BENZONATATE 100 MG PO CAPS: 100.0000 mg | ORAL_CAPSULE | Freq: Three times a day (TID) | ORAL | 0 refills | Status: DC | PRN

## 2018-03-30 MED ORDER — MONTELUKAST SODIUM 10 MG PO TABS: 10.0000 mg | ORAL_TABLET | Freq: Every day | ORAL | 0 refills | Status: DC

## 2018-03-30 MED FILL — MONTELUKAST SOD 10 MG TAB: 10 | 30 days supply | Qty: 30 | Fill #0

## 2018-03-30 MED FILL — BENZONATATE 100 MG CAP: 100 | 10 days supply | Qty: 30 | Fill #0

## 2018-03-30 NOTE — Patient Instructions (Signed)

## 2018-03-30 NOTE — Progress Notes (Signed)
Dana Williams is a 57 y.o. female who presents today with concerns of cough for the last 5 days. She has been taking OTC medications of mucinex and robitussin with minor improvement. She has been more reactive to pollen environmental allergens.  Review of Systems  Constitutional: Negative for chills, fever and malaise/fatigue.  HENT: Positive for congestion. Negative for ear discharge, ear pain, sinus pain and sore throat.   Eyes: Negative.   Respiratory: Positive for cough and sputum production. Negative for shortness of breath.   Cardiovascular: Negative.  Negative for chest pain.  Gastrointestinal: Negative for abdominal pain, diarrhea, nausea and vomiting.  Genitourinary: Negative for dysuria, frequency, hematuria and urgency.  Musculoskeletal: Negative for myalgias.  Skin: Negative.   Neurological: Negative for headaches.  Endo/Heme/Allergies: Negative.   Psychiatric/Behavioral: Negative.     O:  Vitals:   03/30/18 1527 03/30/18 1545  BP: 130/90   Pulse: (!) 111 88  Resp: 17   Temp: 98.9 F (37.2 C)   SpO2: 95%      Physical Exam  Constitutional: She is oriented to person, place, and time. Vital signs are normal. She appears well-developed and well-nourished. She is active.  Non-toxic appearance. She does not have a sickly appearance.  HENT:  Head: Normocephalic.  Right Ear: Hearing, tympanic membrane, external ear and ear canal normal.  Left Ear: Hearing, tympanic membrane, external ear and ear canal normal.  Nose: Nose normal.  Mouth/Throat: Uvula is midline and oropharynx is clear and moist.  Neck: Normal range of motion. Neck supple.  Cardiovascular: Normal rate, regular rhythm, normal heart sounds and normal pulses.  Pulmonary/Chest: Effort normal and breath sounds normal.  Abdominal: Soft. Bowel sounds are normal.  Musculoskeletal: Normal range of motion.  Lymphadenopathy:       Head (right side): No submental and no submandibular adenopathy present.   Head (left side): No submental and no submandibular adenopathy present.    She has no cervical adenopathy.  Neurological: She is alert and oriented to person, place, and time.  Skin: Skin is warm.  Psychiatric: She has a normal mood and affect.  Vitals reviewed.    A: 1. Cough      P: Discussed with patient that cough can persist up to 3 weeks after any URI type symptoms and be normal- advised to increase fluid intake and take medications as directed.   Exam findings, diagnosis etiology and medication use and indications reviewed with patient. Follow- Up and discharge instructions provided. No emergent/urgent issues found on exam.  Patient verbalized understanding of information provided and agrees with plan of care (POC), all questions answered.  1. Cough - montelukast (SINGULAIR) 10 MG tablet; Take 1 tablet (10 mg total) by mouth at bedtime. - benzonatate (TESSALON) 100 MG capsule; Take 1 capsule (100 mg total) by mouth 3 (three) times daily as needed for cough (with full glass of water).

## 2018-04-04 DIAGNOSIS — M24851 Other specific joint derangements of right hip, not elsewhere classified: Secondary | ICD-10-CM | POA: Diagnosis not present

## 2018-05-08 MED FILL — NORETHIN-ETH ESTRAD 1 MG-5: 1-5 | 84 days supply | Qty: 84 | Fill #3

## 2018-05-08 MED FILL — ESOMEPRAZOLE MAG DR 40 MG C: 40 | 30 days supply | Qty: 30 | Fill #0

## 2018-05-09 DIAGNOSIS — Z1231 Encounter for screening mammogram for malignant neoplasm of breast: Secondary | ICD-10-CM | POA: Diagnosis not present

## 2018-05-09 DIAGNOSIS — Z6838 Body mass index (BMI) 38.0-38.9, adult: Secondary | ICD-10-CM | POA: Diagnosis not present

## 2018-05-09 DIAGNOSIS — Z01419 Encounter for gynecological examination (general) (routine) without abnormal findings: Secondary | ICD-10-CM | POA: Diagnosis not present

## 2018-05-09 MED FILL — ALPRAZolam 0.25 MG TABS: 0.25 | 30 days supply | Qty: 30 | Fill #0

## 2018-05-11 ENCOUNTER — Other Ambulatory Visit: Payer: Self-pay | Admitting: Obstetrics and Gynecology

## 2018-05-11 DIAGNOSIS — R928 Other abnormal and inconclusive findings on diagnostic imaging of breast: Secondary | ICD-10-CM

## 2018-05-16 ENCOUNTER — Ambulatory Visit: Payer: 59

## 2018-05-16 ENCOUNTER — Ambulatory Visit
Admission: RE | Admit: 2018-05-16 | Discharge: 2018-05-16 | Disposition: A | Discharge status: Home / Self Care | Payer: 59 | Source: Ambulatory Visit | Attending: Obstetrics and Gynecology | Admitting: Obstetrics and Gynecology

## 2018-05-16 DIAGNOSIS — R928 Other abnormal and inconclusive findings on diagnostic imaging of breast: Secondary | ICD-10-CM | POA: Diagnosis not present

## 2018-05-17 MED FILL — AZELAIC ACID 15 % GEL: 15 | 30 days supply | Qty: 50 | Fill #1

## 2018-05-31 DIAGNOSIS — Z131 Encounter for screening for diabetes mellitus: Secondary | ICD-10-CM | POA: Diagnosis not present

## 2018-05-31 DIAGNOSIS — E7800 Pure hypercholesterolemia, unspecified: Secondary | ICD-10-CM | POA: Diagnosis not present

## 2018-05-31 DIAGNOSIS — E669 Obesity, unspecified: Secondary | ICD-10-CM | POA: Diagnosis not present

## 2018-05-31 DIAGNOSIS — E559 Vitamin D deficiency, unspecified: Secondary | ICD-10-CM | POA: Diagnosis not present

## 2018-05-31 MED FILL — SHINGRIX 50 MCG SUS: 50 | 1 days supply | Qty: 1 | Fill #0

## 2018-06-06 DIAGNOSIS — E669 Obesity, unspecified: Secondary | ICD-10-CM | POA: Diagnosis not present

## 2018-06-06 DIAGNOSIS — Z131 Encounter for screening for diabetes mellitus: Secondary | ICD-10-CM | POA: Diagnosis not present

## 2018-06-06 DIAGNOSIS — E7800 Pure hypercholesterolemia, unspecified: Secondary | ICD-10-CM | POA: Diagnosis not present

## 2018-06-06 DIAGNOSIS — E559 Vitamin D deficiency, unspecified: Secondary | ICD-10-CM | POA: Diagnosis not present

## 2018-06-30 MED FILL — AMMONIUM LACTATE 12% LOTION: 12 | 30 days supply | Qty: 800 | Fill #0

## 2018-06-30 MED FILL — ROSUVASTATIN CALCIUM 10 MG: 10 | 90 days supply | Qty: 90 | Fill #0

## 2018-06-30 MED FILL — ESOMEPRAZOLE MAG DR 40 MG C: 40 | 90 days supply | Qty: 90 | Fill #0

## 2018-07-04 DIAGNOSIS — M545 Low back pain: Secondary | ICD-10-CM | POA: Diagnosis not present

## 2018-07-04 DIAGNOSIS — Z96652 Presence of left artificial knee joint: Secondary | ICD-10-CM | POA: Diagnosis not present

## 2018-07-04 MED FILL — AMOXICILLIN 500 MG CAPSULE: 500 | 4 days supply | Qty: 16 | Fill #0

## 2018-07-04 MED FILL — predniSONE 5 MG TABS: 5 | 6 days supply | Qty: 21 | Fill #0

## 2018-07-27 ENCOUNTER — Encounter (INDEPENDENT_AMBULATORY_CARE_PROVIDER_SITE_OTHER): Payer: Self-pay

## 2018-08-02 DIAGNOSIS — L400 Psoriasis vulgaris: Secondary | ICD-10-CM | POA: Diagnosis not present

## 2018-08-02 DIAGNOSIS — D2261 Melanocytic nevi of right upper limb, including shoulder: Secondary | ICD-10-CM | POA: Diagnosis not present

## 2018-08-02 DIAGNOSIS — D2271 Melanocytic nevi of right lower limb, including hip: Secondary | ICD-10-CM | POA: Diagnosis not present

## 2018-08-02 DIAGNOSIS — L718 Other rosacea: Secondary | ICD-10-CM | POA: Diagnosis not present

## 2018-08-02 DIAGNOSIS — Z85828 Personal history of other malignant neoplasm of skin: Secondary | ICD-10-CM | POA: Diagnosis not present

## 2018-08-02 DIAGNOSIS — D225 Melanocytic nevi of trunk: Secondary | ICD-10-CM | POA: Diagnosis not present

## 2018-08-02 DIAGNOSIS — L853 Xerosis cutis: Secondary | ICD-10-CM | POA: Diagnosis not present

## 2018-08-02 MED FILL — AZELAIC ACID 15 % GEL: 15 | 20 days supply | Qty: 50 | Fill #0

## 2018-08-02 MED FILL — AMMONIUM LACTATE 12% LOTION: 12 | 60 days supply | Qty: 800 | Fill #0

## 2018-08-02 MED FILL — DOXYCYCLINE HYCLATE 100 MG: 100 | 15 days supply | Qty: 30 | Fill #0

## 2018-08-04 MED FILL — AMOXICILLIN 500 MG CAPSULE: 500 | 5 days supply | Qty: 15 | Fill #0

## 2018-08-04 MED FILL — HYDROCODON-APAP 10-325: 10-325 | 2 days supply | Qty: 10 | Fill #0

## 2018-08-05 IMAGING — MG DIGITAL DIAGNOSTIC UNILATERAL RIGHT MAMMOGRAM WITH TOMO AND CAD
4 series · 4 of 12 positions shown · non-contrast
Comparison: Previous exam(s).

CLINICAL DATA: Right breast upper outer quadrant possible
distortion seen on most recent screening mammography.

EXAM:
DIGITAL DIAGNOSTIC UNILATERAL RIGHT MAMMOGRAM WITH CAD AND TOMO

[R MLO synth-2D]
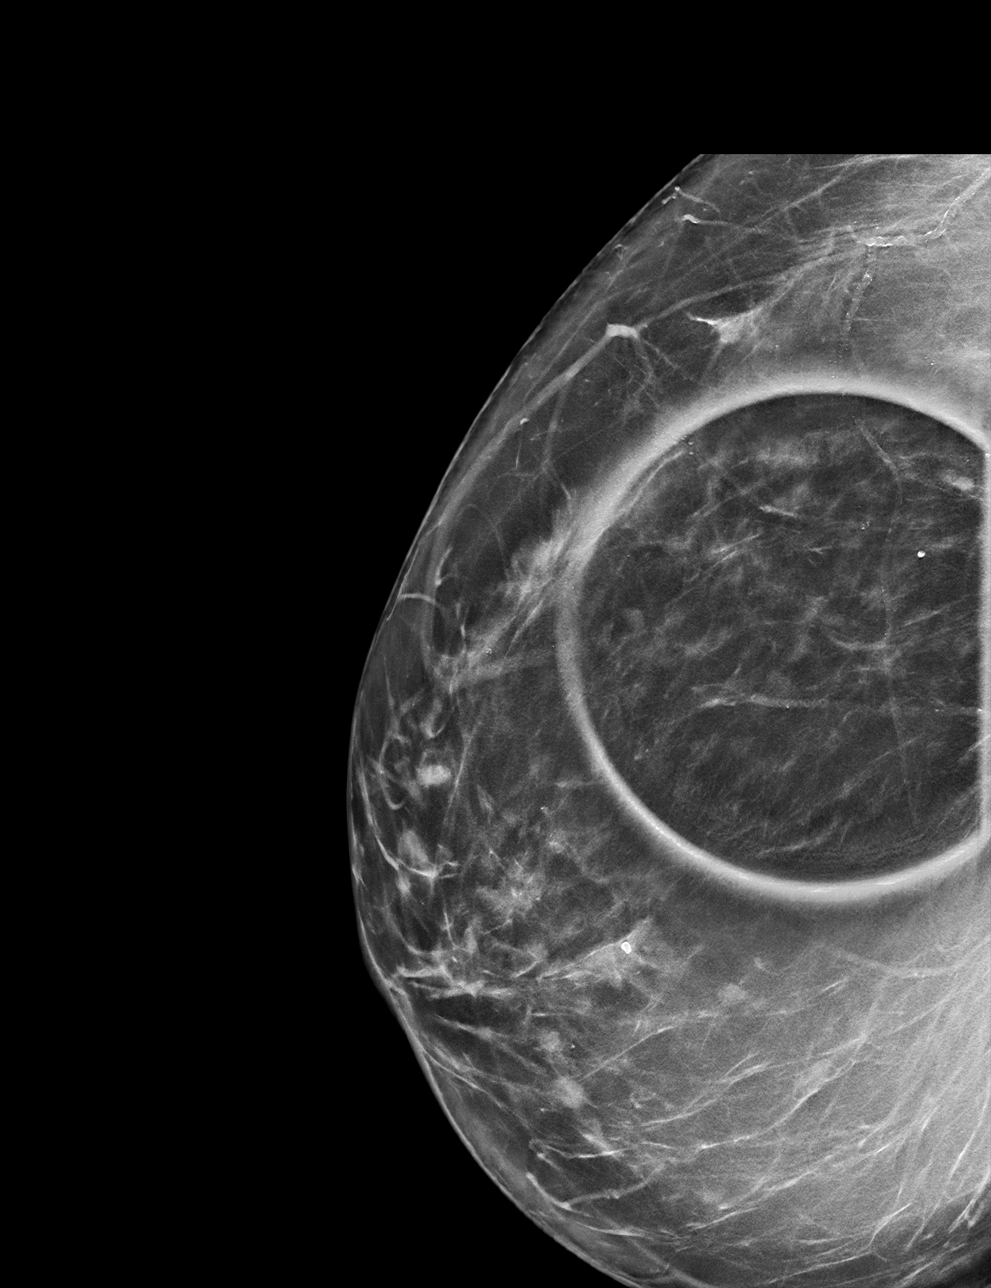

[R CC synth-2D]
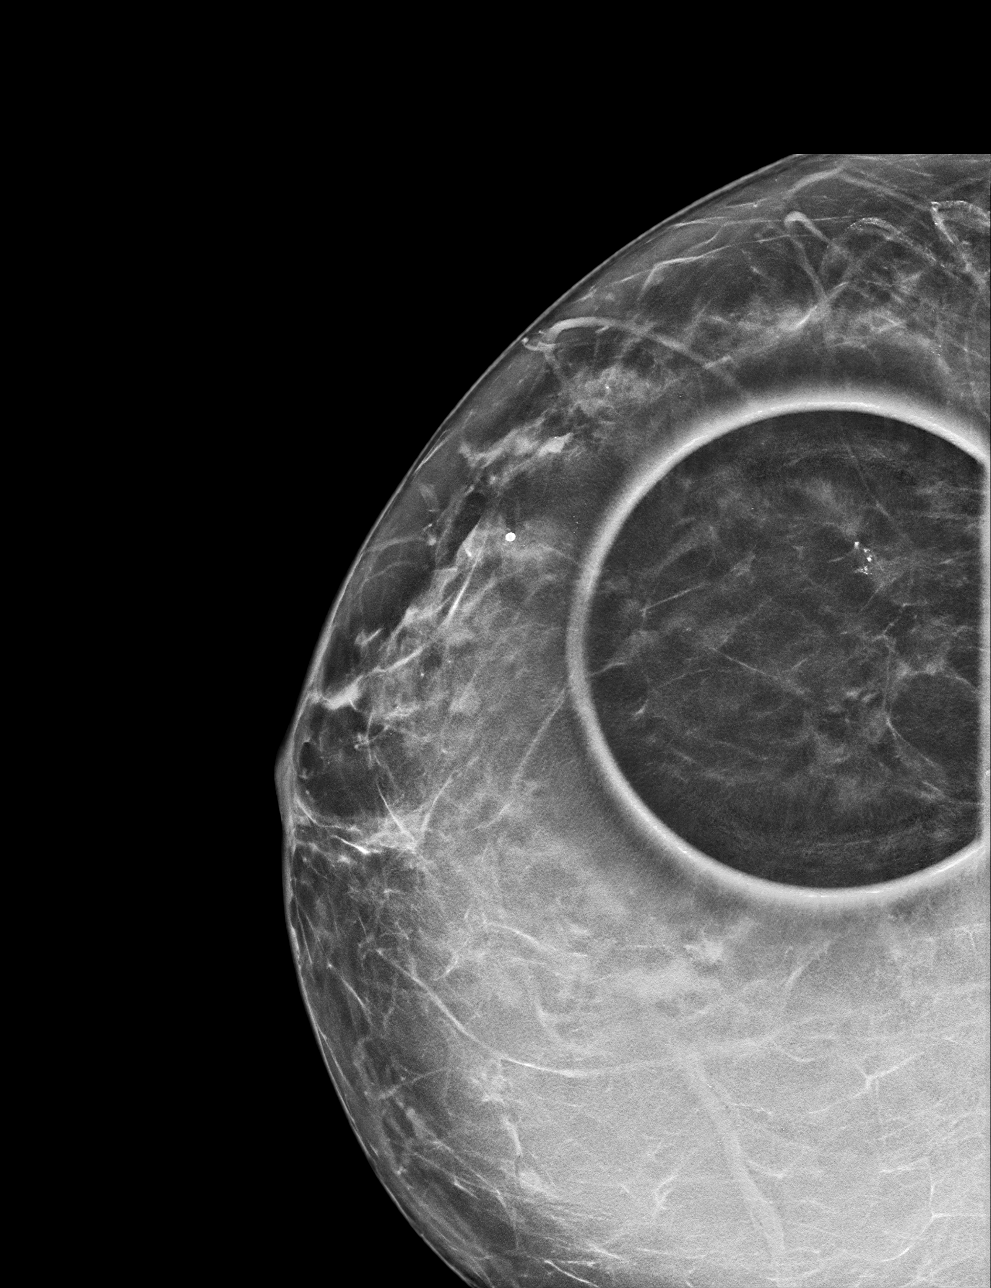

[R MLO tomo · tomo slice 40/79.0]
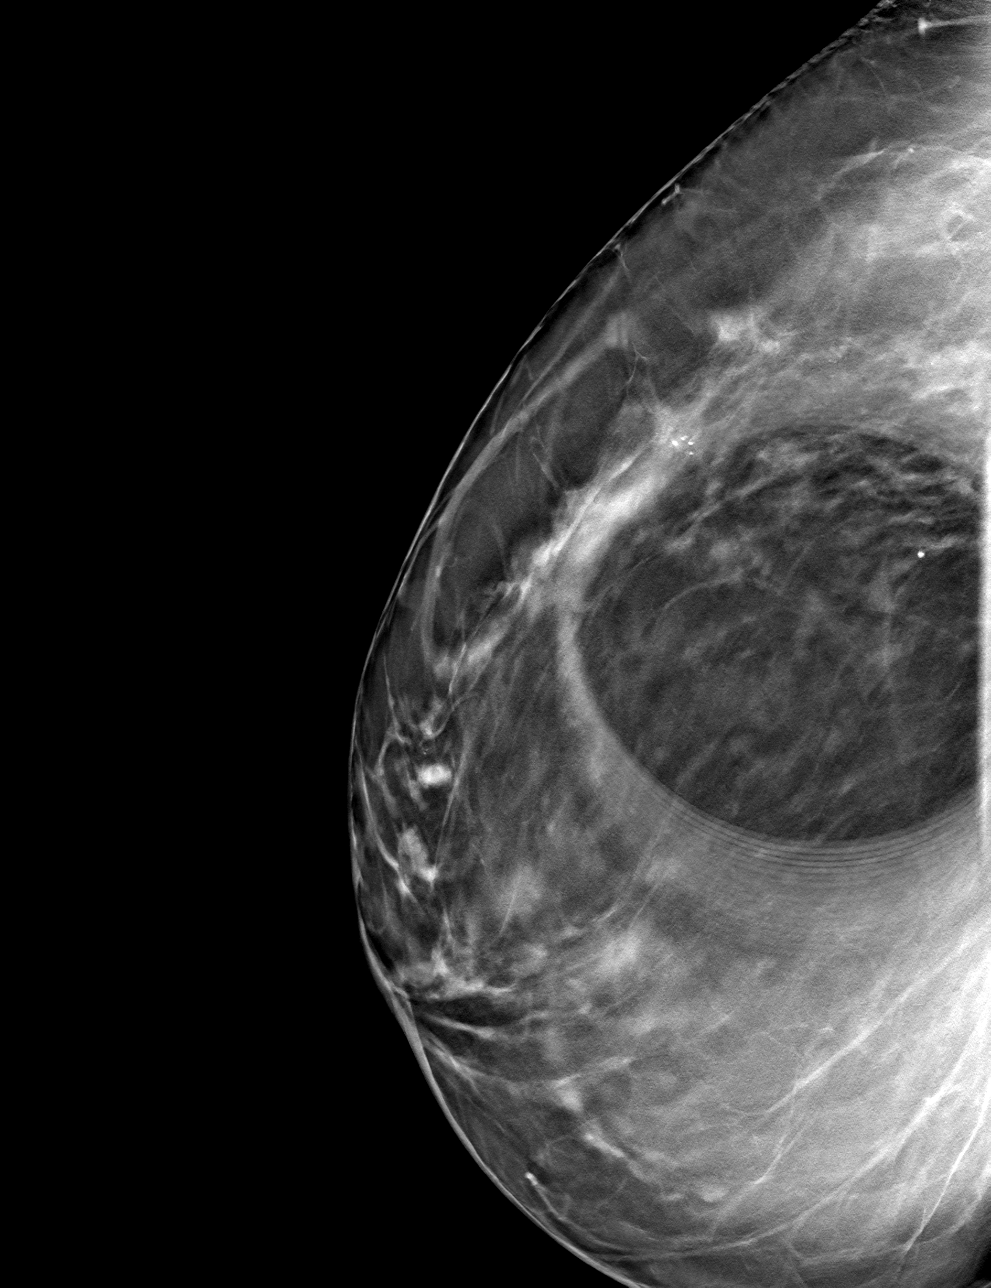

[R CC tomo · tomo slice 33/65.0]
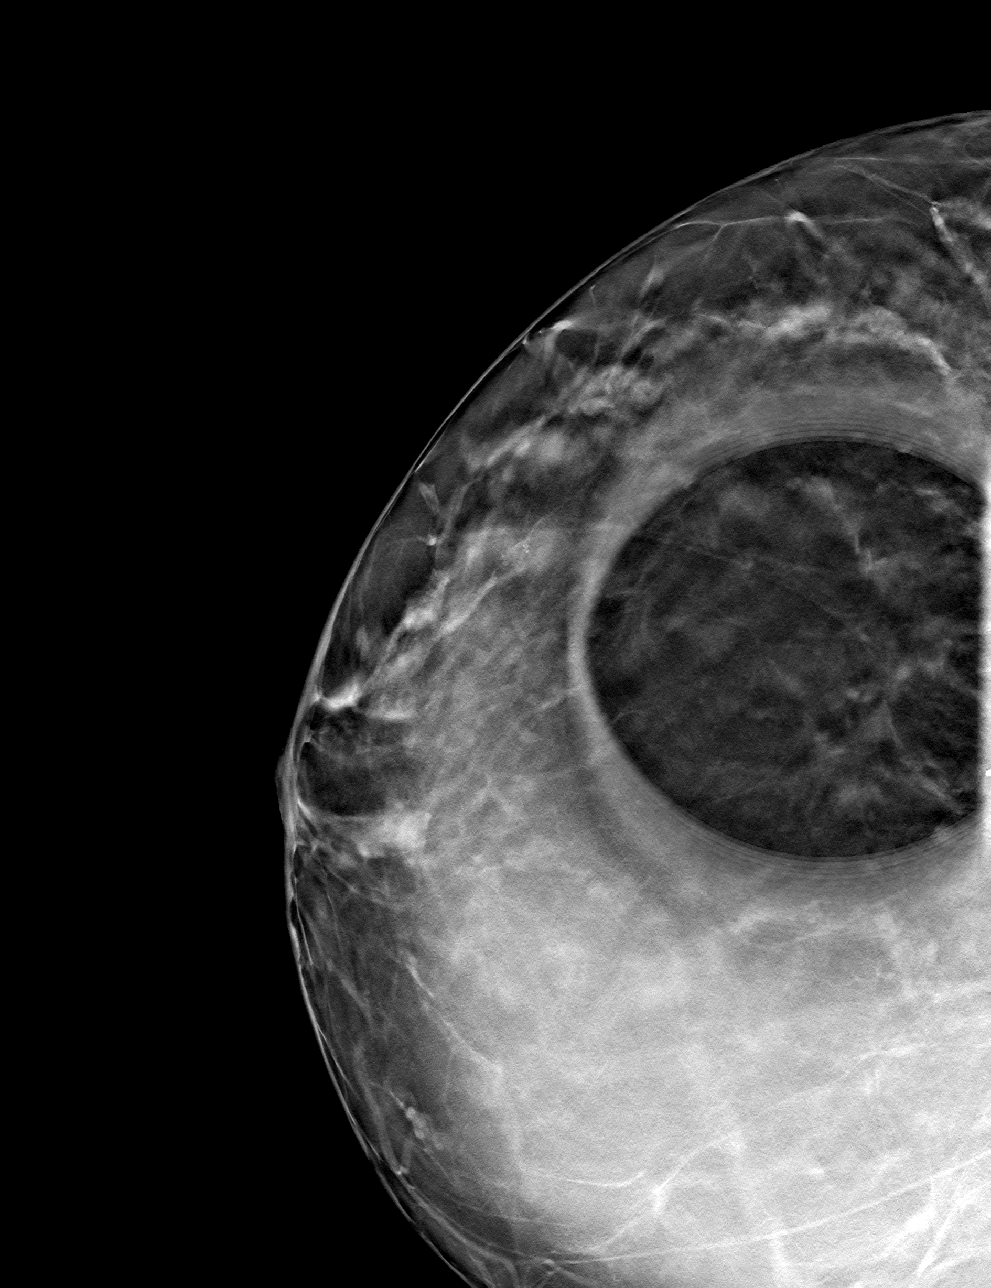

[4 of 12 positions shown; findings below may reference images not displayed]

ACR Breast Density Category b: There are scattered areas of
fibroglandular density.
FINDINGS: Additional mammographic views of the right breast demonstrate no
suspicious masses areas of architectural distortion or clustered
calcifications. The previously seen possible distortion in the right
breast upper outer quadrant, posterior depth, effaces to glandular
tissue on additional imaging.

Mammographic images were processed with CAD.
IMPRESSION: No mammographic evidence of malignancy in the right breast.

RECOMMENDATION:
Screening mammogram in one year.(Code:[id number]-3-XEX)

I have discussed the findings and recommendations with the patient.
Results were also provided in writing at the conclusion of the
visit. If applicable, a reminder letter will be sent to the patient
regarding the next appointment.

BI-RADS CATEGORY  1: Negative.

## 2018-08-08 MED FILL — TACLONEX SCALP SUSPENSION: 0.005-0.064 | 30 days supply | Qty: 120 | Fill #0

## 2018-08-10 ENCOUNTER — Encounter (INDEPENDENT_AMBULATORY_CARE_PROVIDER_SITE_OTHER): Payer: Self-pay | Admitting: Family Medicine

## 2018-08-10 ENCOUNTER — Ambulatory Visit (INDEPENDENT_AMBULATORY_CARE_PROVIDER_SITE_OTHER): Payer: 59 | Admitting: Family Medicine

## 2018-08-10 VITALS — BP 133/85 | HR 76 | Temp 97.8°F | Ht 65.0 in | Wt 236.0 lb

## 2018-08-10 DIAGNOSIS — Z6839 Body mass index (BMI) 39.0-39.9, adult: Secondary | ICD-10-CM

## 2018-08-10 DIAGNOSIS — Z0289 Encounter for other administrative examinations: Secondary | ICD-10-CM

## 2018-08-10 DIAGNOSIS — R5383 Other fatigue: Secondary | ICD-10-CM

## 2018-08-10 DIAGNOSIS — E7849 Other hyperlipidemia: Secondary | ICD-10-CM

## 2018-08-10 DIAGNOSIS — Z1331 Encounter for screening for depression: Secondary | ICD-10-CM

## 2018-08-10 DIAGNOSIS — E559 Vitamin D deficiency, unspecified: Secondary | ICD-10-CM

## 2018-08-10 DIAGNOSIS — R0602 Shortness of breath: Secondary | ICD-10-CM | POA: Diagnosis not present

## 2018-08-10 DIAGNOSIS — Z9189 Other specified personal risk factors, not elsewhere classified: Secondary | ICD-10-CM

## 2018-08-11 LAB — CBC WITH DIFFERENTIAL
Basophils Absolute: 0 10*3/uL (ref 0.0–0.2)
Basos: 0 %
EOS (ABSOLUTE): 0.1 10*3/uL (ref 0.0–0.4)
Eos: 2 %
Hematocrit: 35.6 % (ref 34.0–46.6)
Hemoglobin: 11 g/dL — ABNORMAL LOW (ref 11.1–15.9)
Immature Grans (Abs): 0 10*3/uL (ref 0.0–0.1)
Immature Granulocytes: 0 %
Lymphocytes Absolute: 1.7 10*3/uL (ref 0.7–3.1)
Lymphs: 35 %
MCH: 27.3 pg (ref 26.6–33.0)
MCHC: 30.9 g/dL — ABNORMAL LOW (ref 31.5–35.7)
MCV: 88 fL (ref 79–97)
Monocytes Absolute: 0.3 10*3/uL (ref 0.1–0.9)
Monocytes: 5 %
Neutrophils Absolute: 2.7 10*3/uL (ref 1.4–7.0)
Neutrophils: 58 %
RBC: 4.03 x10E6/uL (ref 3.77–5.28)
RDW: 13.8 % (ref 12.3–15.4)
WBC: 4.7 10*3/uL (ref 3.4–10.8)

## 2018-08-11 LAB — COMPREHENSIVE METABOLIC PANEL
ALT: 18 IU/L (ref 0–32)
AST: 17 IU/L (ref 0–40)
Albumin/Globulin Ratio: 1.6 (ref 1.2–2.2)
Albumin: 4 g/dL (ref 3.5–5.5)
Alkaline Phosphatase: 115 IU/L (ref 39–117)
BUN/Creatinine Ratio: 14 (ref 9–23)
BUN: 10 mg/dL (ref 6–24)
Bilirubin Total: 0.3 mg/dL (ref 0.0–1.2)
CO2: 23 mmol/L (ref 20–29)
Calcium: 9.3 mg/dL (ref 8.7–10.2)
Chloride: 103 mmol/L (ref 96–106)
Creatinine, Ser: 0.73 mg/dL (ref 0.57–1.00)
GFR calc Af Amer: 105 mL/min/{1.73_m2} (ref >59)
GFR calc non Af Amer: 91 mL/min/{1.73_m2} (ref >59)
Globulin, Total: 2.5 g/dL (ref 1.5–4.5)
Glucose: 87 mg/dL (ref 65–99)
Potassium: 4.1 mmol/L (ref 3.5–5.2)
Sodium: 143 mmol/L (ref 134–144)
Total Protein: 6.5 g/dL (ref 6.0–8.5)

## 2018-08-11 LAB — LIPID PANEL WITH LDL/HDL RATIO
Cholesterol, Total: 182 mg/dL (ref 100–199)
HDL: 89 mg/dL (ref >39)
LDL Calculated: 70 mg/dL (ref 0–99)
LDl/HDL Ratio: 0.8 ratio (ref 0.0–3.2)
Triglycerides: 116 mg/dL (ref 0–149)
VLDL Cholesterol Cal: 23 mg/dL (ref 5–40)

## 2018-08-11 LAB — HEMOGLOBIN A1C
Est. average glucose Bld gHb Est-mCnc: 108 mg/dL
Hgb A1c MFr Bld: 5.4 % (ref 4.8–5.6)

## 2018-08-11 LAB — T3: T3, Total: 177 ng/dL (ref 71–180)

## 2018-08-11 LAB — INSULIN, RANDOM: INSULIN: 10.9 u[IU]/mL (ref 2.6–24.9)

## 2018-08-11 LAB — VITAMIN D 25 HYDROXY (VIT D DEFICIENCY, FRACTURES): Vit D, 25-Hydroxy: 33.9 ng/mL (ref 30.0–100.0)

## 2018-08-11 LAB — TSH: TSH: 1.99 u[IU]/mL (ref 0.450–4.500)

## 2018-08-11 LAB — FOLATE: Folate: 11.2 ng/mL (ref >3.0)

## 2018-08-11 LAB — T4, FREE: Free T4: 1.11 ng/dL (ref 0.82–1.77)

## 2018-08-11 LAB — VITAMIN B12: Vitamin B-12: 407 pg/mL (ref 232–1245)

## 2018-08-14 NOTE — Progress Notes (Signed)
.  Office: [phone]  /  Fax: [phone]   HPI:   Chief Complaint: Dana Williams (MR# [id number]) is Dana Williams 57 y.o. female who presents on 08/14/2018 for obesity evaluation and treatment. Current BMI is Body mass index is 39.27 kg/m.[name] Dana Williams has struggled with obesity for years and has been unsuccessful in either losing weight or maintaining long term weight loss. Dana Williams attended our information session and states she is currently in the action stage of change and ready to dedicate time achieving and maintaining Dana Williams healthier weight.  Dana Williams states her desired weight loss is 50 lbs she has been heavy most of  her life she started gaining weight after menopause her heaviest weight ever was 230 lbs. she has significant Williams cravings issues  she snacks frequently in the evenings she is frequently drinking liquids with calories she has binge eating behaviors she struggles with emotional eating    Dana Williams feels her energy is lower than it should be. This has worsened with weight gain and has not worsened recently. Dana Williams admits to daytime somnolence and  admits to waking up still tired. Patient is at risk for obstructive sleep apnea. Patent has Dana Williams history of symptoms of daytime Dana and morning Dana. Patient generally gets 4 hours of sleep per night, and states they generally have nighttime awakenings. Snoring is not present. Apneic episodes are not present. Epworth Sleepiness Williams is 2  Dyspnea on exertion Dana Williams notes increasing shortness of breath with exercising and seems to be worsening over time with weight gain. She notes getting out of breath sooner with activity than she used to. This has not gotten worse recently. Mayerly denies orthopnea.  Hyperlipidemia Jazmen has hyperlipidemia and she is currently on statin. She is attempting to improve her cholesterol levels with intensive lifestyle modification including Dana Williams low saturated fat diet, exercise and weight loss. She denies  any chest pain or myalgias.  At risk for cardiovascular disease Dana Williams is at Dana Williams higher than average risk for cardiovascular disease due to obesity and hyperlipidemia. She currently denies any chest pain.  Vitamin D deficiency Dana Williams has Dana Williams diagnosis of vitamin D deficiency. She is currently taking multi vitamin and there are no recent labs. Dana Williams admits to Dana and denies nausea, vomiting or muscle weakness.  Positive Depression Screen Dana Williams has Dana Williams positive depression screen and she is not on antidepressants. Her Williams is depressed, she feels isolated and feels she has no social life.Dana Williams is working long hours and she knows, she isn't taking care of her health like she should.  Dana Williams was  Depression screen PHQ 2/9 08/10/2018  Decreased Interest 2  Down, Depressed, Hopeless 3  PHQ - 2 Williams 5  Altered sleeping 3  Tired, decreased energy 3  Change in appetite 3  Feeling bad or failure about yourself  2  Trouble concentrating 1  Moving slowly or fidgety/restless 1  Suicidal thoughts 0  PHQ-9 Williams 18  Difficult doing work/chores Somewhat difficult    ALLERGIES: Allergies  Allergen Reactions  . Sulfa Antibiotics Other (See Comments)    Mouth ulcers    MEDICATIONS: Current Outpatient Medications on File Prior to Visit  Medication Sig Dispense Refill  . ammonium lactate (LAC-HYDRIN) 12 % lotion Apply 1 application topically daily.     . Azelaic Acid (FINACEA) 15 % cream Apply 1 application topically daily.     . calcipotriene-betamethasone (TACLONEX) external suspension Apply 1 application topically daily as needed.     [name]  esomeprazole (NEXIUM) 40 MG capsule Take 40 mg by mouth daily before breakfast.    . HYDROcodone-acetaminophen (NORCO/VICODIN) 5-325 MG tablet Take 1 tablet by mouth every 6 (six) hours as needed for moderate pain.    [name] ibuprofen (ADVIL,MOTRIN) 600 MG tablet Take 600 mg by mouth every 6 (six) hours as needed.    . Multiple  Vitamins-Minerals (MULTIVITAMIN/EXTRA VITAMIN D3) CHEW Chew 2 tablets by mouth daily.    . norethindrone-ethinyl estradiol (FEMHRT 1/5) 1-5 MG-MCG TABS tablet Take 1 tablet by mouth daily.   12  . rosuvastatin (CRESTOR) 10 MG tablet Take 10 mg by mouth daily.     No current facility-administered medications on file prior to visit.     PAST MEDICAL HISTORY: Past Medical History:  Diagnosis Date  . Arthritis   . Arthritis   . Arthritis of pelvic region   . Depression   . Dry skin   . Dana   . GERD (gastroesophageal reflux disease)   . H/O psoriatic arthritis    DX  age 85---- in remission for 20 years  . History of hiatal hernia   . History of kidney stones   . Hyperlipidemia   . Joint pain   . Kidney stones   . Obesity   . Personal history of colonic polyp - adenoma 01/29/2014  . Plantar fasciitis of left foot   . PONV (postoperative nausea and vomiting)   . Postmenopausal   . Psoriasis   . Renal calculus, left   . Sigmoid diverticulosis   . Vitamin D deficiency     PAST SURGICAL HISTORY: Past Surgical History:  Procedure Laterality Date  . CLOSED MANIPULATION SHOULDER    . COLONOSCOPY WITH PROPOFOL  01-25-2014  . CYSTO/  BILATERAL RETROGRADE PYELOGRAM/  BILATERAL URETEROSCOPIC LASER LITHOTRIPSY STONE EXTRACTIONS/  BILATERAL STENT PLACEMENT  02-13-2010  . CYSTOSCOPY WITH RETROGRADE PYELOGRAM, URETEROSCOPY AND STENT PLACEMENT Left 04/21/2015   Procedure: CYSTOSCOPY WITH RETROGRADE PYELOGRAM, URETEROSCOPY, STONE EXTRACTION AND STENT PLACEMENT;  Surgeon: Franchot Gallo, MD;  Location: [facility];  Service: Urology;  Laterality: Left;  . CYSTOURETHROSCOPY/  PLACEMENT LYNX SUPRAPUBIC SLING  05-24-2008  . HAND TENDON SURGERY Right 1993  . HOLMIUM LASER APPLICATION Left 06/15/8562   Procedure: HOLMIUM LASER APPLICATION;  Surgeon: Franchot Gallo, MD;  Location: [facility];  Service: Urology;  Laterality: Left;  . LAPAROSCOPIC CHOLECYSTECTOMY   01-31-2002  . LEFT URETEROSCOPIC STONE EXTRACTION/  STENT PLACEMENT  10-06-2005  . RHINOPLASTY  2007  . STRABISMUS SURGERY Left age 75  . TOTAL KNEE ARTHROPLASTY Left 10/27/2016   Procedure: TOTAL KNEE ARTHROPLASTY;  Surgeon: [name], MD;  Location: [facility];  Service: Orthopedics;  Laterality: Left;  . WISDOM TOOTH EXTRACTION  age 31    SOCIAL HISTORY: Social History   Tobacco Use  . Smoking status: Never Smoker  . Smokeless tobacco: Never Used  Substance Use Topics  . Alcohol use: No  . Drug use: No    FAMILY HISTORY: Family History  Problem Relation Age of Onset  . Stomach cancer Paternal Grandmother   . Hypertension Mother   . Hyperlipidemia Mother   . Thyroid disease Mother   . Obesity Mother   . Colon cancer Neg Hx   . Esophageal cancer Neg Hx   . Rectal cancer Neg Hx     ROS: Review of Systems  Constitutional: Positive for malaise/Dana.  Eyes:       + Wear Glasses or Contacts  Respiratory: Positive for shortness  of breath (on exertion).   Cardiovascular: Negative for chest pain and orthopnea.  Gastrointestinal: Negative for nausea and vomiting.  Musculoskeletal: Negative for myalgias.       Negative for muscle weakness + Muscle or Joint Pain  Skin:       + Dryness  Psychiatric/Behavioral: Positive for depression. The patient has insomnia.     PHYSICAL EXAM: Blood pressure 133/85, pulse 76, temperature 97.8 F (36.6 C), temperature source Oral, height 5\' 5"  (1.651 m), weight 236 lb (107 kg), SpO2 99 %. Body mass index is 39.27 kg/m. Physical Exam  Constitutional: She is oriented to person, place, and time. She appears well-developed and well-nourished.  HENT:  Head: Normocephalic and atraumatic.  Nose: Nose normal.  Eyes: EOM are normal. No scleral icterus.  Neck: Normal range of motion. Neck supple. No thyromegaly present.  Cardiovascular: Normal rate and regular rhythm.  Pulmonary/Chest: Effort normal. No respiratory distress.  Abdominal:  Soft. There is no tenderness.  + Obesity  Musculoskeletal: Normal range of motion.  Range of Motion normal in all 4 extremities  Neurological: She is alert and oriented to person, place, and time. Coordination normal.  Skin: Skin is warm and dry.  Psychiatric: She has Dana Williams normal Williams and affect. Her behavior is normal.  Vitals reviewed.   RECENT LABS AND TESTS: BMET    Component Value Date/Time   NA 143 08/10/2018 1231   K 4.1 08/10/2018 1231   CL 103 08/10/2018 1231   CO2 23 08/10/2018 1231   GLUCOSE 87 08/10/2018 1231   GLUCOSE 129 (H) 10/28/2016 0530   BUN 10 08/10/2018 1231   CREATININE 0.73 08/10/2018 1231   CALCIUM 9.3 08/10/2018 1231   GFRNONAA 91 08/10/2018 1231   GFRAA 105 08/10/2018 1231   Lab Results  Component Value Date   HGBA1C 5.4 08/10/2018   Lab Results  Component Value Date   INSULIN 10.9 08/10/2018   CBC    Component Value Date/Time   WBC 4.7 08/10/2018 1231   WBC 7.2 10/28/2016 0530   RBC 4.03 08/10/2018 1231   RBC 3.86 (L) 10/28/2016 0530   HGB 11.0 (L) 08/10/2018 1231   HCT 35.6 08/10/2018 1231   PLT 213 10/28/2016 0530   MCV 88 08/10/2018 1231   MCH 27.3 08/10/2018 1231   MCH 29.5 10/28/2016 0530   MCHC 30.9 (L) 08/10/2018 1231   MCHC 32.3 10/28/2016 0530   RDW 13.8 08/10/2018 1231   LYMPHSABS 1.7 08/10/2018 1231   EOSABS 0.1 08/10/2018 1231   BASOSABS 0.0 08/10/2018 1231   Iron/TIBC/Ferritin/ %Sat No results found for: IRON, TIBC, FERRITIN, IRONPCTSAT Lipid Panel     Component Value Date/Time   CHOL 182 08/10/2018 1231   TRIG 116 08/10/2018 1231   HDL 89 08/10/2018 1231   LDLCALC 70 08/10/2018 1231   Hepatic Function Panel     Component Value Date/Time   PROT 6.5 08/10/2018 1231   ALBUMIN 4.0 08/10/2018 1231   AST 17 08/10/2018 1231   ALT 18 08/10/2018 1231   ALKPHOS 115 08/10/2018 1231   BILITOT 0.3 08/10/2018 1231      Component Value Date/Time   TSH 1.990 08/10/2018 1231   Vitamin D There are no recent lab  results  ECG  shows NSR with Dana Williams rate of 74 BPM INDIRECT CALORIMETER done today shows Dana Williams VO2 of 323 and Dana Williams REE of 2247. Her calculated basal metabolic rate is 4098 thus her basal metabolic rate is better than expected.    ASSESSMENT AND PLAN:  Other Dana - Plan: EKG 12-Lead, Vitamin B12, CBC With Differential, Comprehensive metabolic panel, Folate, Hemoglobin A1c, Insulin, random, T3, T4, free, TSH  Shortness of breath on exertion - Plan: CBC With Differential  Other hyperlipidemia - Plan: Lipid Panel With LDL/HDL Ratio  Vitamin D deficiency - Plan: VITAMIN D 25 Hydroxy (Vit-D Deficiency, Fractures)  Positive depression screening  At risk for heart disease  Class 2 severe obesity with serious comorbidity and body mass index (BMI) of 39.0 to 39.9 in adult, unspecified obesity type (HCC)  PLAN:  Dana Avanni was informed that her Dana may be related to obesity, depression or many other causes. Labs will be ordered, and in the meanwhile Ifeoma has agreed to work on diet, exercise and weight loss to help with Dana. Proper sleep hygiene was discussed including the need for 7-8 hours of quality sleep each night. Dana Williams sleep study was not ordered based on symptoms and Epworth Williams.  Dyspnea on exertion Zarahi's shortness of breath appears to be obesity related and exercise induced. She has agreed to work on weight loss and gradually increase exercise to treat her exercise induced shortness of breath. If Tyrese follows our instructions and loses weight without improvement of her shortness of breath, we will plan to refer to pulmonology. We will monitor this condition regularly. Shawonda agrees to this plan.  Hyperlipidemia Allyson was informed of the American Heart Association Guidelines emphasizing intensive lifestyle modifications as the first line treatment for hyperlipidemia. We discussed many lifestyle modifications today in depth, and Naiara will start diet and work on decreasing saturated  fats such as fatty red meat, butter and many fried foods. She will also increase vegetables and lean protein in her diet and continue to work on exercise and weight loss efforts.  Cardiovascular risk counseling Peachie was given extended (15 minutes) coronary artery disease prevention counseling today. She is 57 y.o. female and has risk factors for heart disease including obesity and hyperlipidemia. We discussed intensive lifestyle modifications today with an emphasis on specific weight loss instructions and strategies. Pt was also informed of the importance of increasing exercise and decreasing saturated fats to help prevent heart disease.  Vitamin D Deficiency Kirandeep was informed that low vitamin D levels contributes to Dana and are associated with obesity, breast, and colon cancer. She will continue to take multi vitamin and will follow up for routine testing of vitamin D, at least 2-3 times per year. She was informed of the risk of over-replacement of vitamin D and agrees to not increase her dose unless she discusses this with [name] first. We will check labs and Addalynn will follow up as directed.  Positive Depression Screen Mckinzey had Dana Williams strongly positive depression screening. Depression is commonly associated with obesity and often results in emotional eating behaviors. We will monitor this closely and work on CBT to help improve the non-hunger eating patterns. We will refer to Dr. Mallie Mussel, our bariatric psychologist for evaluation and therapy.  Obesity Mario is currently in the action stage of change and her goal is to continue with weight loss efforts She has agreed to follow the Category 3 plan Monzerat has been instructed to work up to Dana Williams goal of 150 minutes of combined cardio and strengthening exercise per week for weight loss and overall health benefits. We discussed the following Behavioral Modification Strategies today: increasing lean protein intake, decreasing simple carbohydrates  and work on  meal planning and easy cooking plans  Allyssa has agreed to follow up with our clinic  in 2 weeks. She was informed of the importance of frequent follow up visits to maximize her success with intensive lifestyle modifications for her multiple health conditions. She was informed we would discuss her lab results at her next visit unless there is Dana Williams critical issue that needs to be addressed sooner. Tisa agreed to keep her next visit at the agreed upon time to discuss these results.    OBESITY BEHAVIORAL INTERVENTION VISIT  Today's visit was # 1   Starting weight: 236 lbs Starting date: 08/10/18 Today's weight : 236 lbs Today's date: 08/10/2018 Total lbs lost to date: 0   ASK: We discussed the diagnosis of obesity with Berneta Sages today and Dana Williams agreed to give [name] permission to discuss obesity behavioral modification therapy today.  ASSESS: Yarieliz has the diagnosis of obesity and her BMI today is 39.27 Glenda is in the action stage of change   ADVISE: Anvita was educated on the multiple health risks of obesity as well as the benefit of weight loss to improve her health. She was advised of the need for long term treatment and the importance of lifestyle modifications to improve her current health and to decrease her risk of future health problems.  AGREE: Multiple dietary modification options and treatment options were discussed and  Dayshia agreed to follow the recommendations documented in the above note.  ARRANGE: Refugio was educated on the importance of frequent visits to treat obesity as outlined per CMS and USPSTF guidelines and agreed to schedule her next follow up appointment today.   I, Doreene Nest, am acting as transcriptionist for Dennard Nip, MD  I have reviewed the above documentation for accuracy and completeness, and I agree with the above. -Dennard Nip, MD

## 2018-08-22 MED FILL — ESTRADIOL-NORETHINDRONE ACE: 1-0.5 | 28 days supply | Qty: 28 | Fill #0

## 2018-08-23 MED FILL — valACYclovir HCL 1 GM TABS: 1 | 5 days supply | Qty: 10 | Fill #0

## 2018-08-23 NOTE — Progress Notes (Addendum)
Office: [phone]  /  Fax: [phone] Date: August 24, 2018 Time Seen: 10:00am Duration: 60 minutes Provider: Glennie Isle, PsyD Type of Session: Intake for Individual Therapy   Informed Consent: The provider's role was explained to [facility]. The provider reviewed and discussed issues of confidentiality, privacy, and limits therein. In addition to verbal informed consent, written informed consent for psychological services was obtained from [place] prior to the initial intake interview. Written consent included information concerning the practice, financial arrangements, and confidentiality and patients' rights. Since the clinic is not a 24/7 crisis center, mental health emergency resources were shared and a handout was provided. The provider further explained the utilization of MyChart, e-mail, voicemail, and/or other messaging systems can be utilized for non-emergency reasons. Roy verbally acknowledged understanding of the aforementioned, and agreed to use mental health emergency resources discussed if needed. Moreover, Dejah agreed information may be shared with other [facility] providers as needed for coordination of care, and written consent was obtained.   Chief Complaint: Dana Williams was referred by Dr. Dennard Nip due to positive depression screen. Per the note for the initial visit with Dr. Dennard Nip on August 10, 2018, "Kwynn has a positive depression screen and she is not on antidepressants. Her mood is depressed, she feels isolated and feels she has no social life.Itali is working long hours and she knows, she isn't taking care of her health like she should."   During today's appointment, Linlee explained she gained "signinfant weight" recently. She noted she has tried diets in the past, but "nothing seemed to work." Swara described herself as a "boredom and emotional eater." She discussed her current work schedules make it difficult to make healthier eating  choices. Regarding the current meal plan, Emberlyn noted she did not obtain the foods for the meal plan until a few days after her appointment with Dr. [name]. She indicated that while she has not completely followed the meal plan, she has done "better." Per her scale at home, Alya lost ten pounds. In addition, Donnielle described craving candy.   Rosaleah was asked to complete a questionnaire assessing various behaviors related to emotional eating. Micaella endorsed the following: experience food cravings on a regular basis, eat certain foods when you are anxious, stressed, depressed, or your feelings are hurt, use food to help you cope with emotional situations, find food is comforting to you and overeat frequently when you are bored or lonely.  HPI: Per the note for the initial visit with Dr. Dennard Nip on August 10, 2018, Kamauri has been heavy most of her life and started gaining weight after menopause. Her heaviest weight ever was 230 pounds. During her initial appointment with Dr. [name], Ileene Hutchinson reported experiencing the following: significant food cravings issues; snacking frequently in the evening; frequently drinking liquids with calories; binge eating behavior; and struggling with emotional eating. During today's appointment, Malachi denied a history of binge eating. She denied a history of purging and engagement in other compensatory strategies. Briunna has never been diagnosed with an eating disorder.   Mental Status Examination: Arlita arrived on time for the appointment. She presented as appropriately dressed and groomed. Kenyanna appeared her stated age and demonstrated adequate orientation to time, place, person, and purpose of the appointment. She also demonstrated appropriate eye contact. No psychomotor abnormalities or behavioral peculiarities noted. Her mood was euthymic with congruent affect. Her thought processes were logical, linear, and goal-directed. No hallucinations, delusions, bizarre thinking or  behavior reported or observed. Judgment, insight, and  impulse control appeared to be grossly intact. There was no evidence of paraphasias (i.e., errors in speech, gross mispronunciations, and word substitutions), repetition deficits, or disturbances in volume or prosody (i.e., rhythm and intonation). There was no evidence of attention or memory impairments. Leonela denied current suicidal and homicidal ideation, plan, and intent.   The Montreal Cognitive Assessment (MoCA) was administered. The MoCA assesses different cognitive domains: attention and concentration, executive functions, memory, language, visuoconstructional skills, conceptual thinking, calculations, and orientation. Jess received 25 out of 30 points possible on the MoCA.  It is important to note that symptoms of anxiety can impact scores on the MoCA. It appears that Prabhleen was experiencing anxiety as evidenced by her verbalizing "I am nervous" during the administration of the assessment. As such, her score should be interpreted with caution. Cinde lost one point on the visuospatial/executive task requiring her to replicate a visual stimuli. One point was lost on the abstraction task requiring her to identify the similarity between two words. In addition, three points were lost on the delayed recall task as Ileene Hutchinson recall two out of five words after a short delay. She was unable to identify the remaining three words with category cues. With additional multiple choice cues, Shaeleigh recalled two additional words but could not recall the last word.  Family & Psychosocial History: Darsha reported she is not currently in a relationship and added, "I haven't dated in several years." She indicated she has two adult children (ages 53 and 63). Both her children reside in [place] and she visits them once or twice a month. Aleyna reported she is employed at [employer] and part time with [employer]. Her highest level of education is  an Associate's Degree. Konner's social support system consists of the following: best friend and children. She indicated she identifies with Christianity. She explained she is looking for a new church and is currently part of [employer].   Medical History:  Past Medical History:  Diagnosis Date  . Arthritis   . Arthritis   . Arthritis of pelvic region   . Depression   . Dry skin   . Fatigue   . GERD (gastroesophageal reflux disease)   . H/O psoriatic arthritis    DX  age 71---- in remission for 20 years  . History of hiatal hernia   . History of kidney stones   . Hyperlipidemia   . Joint pain   . Kidney stones   . Obesity   . Personal history of colonic polyp - adenoma 01/29/2014  . Plantar fasciitis of left foot   . PONV (postoperative nausea and vomiting)   . Postmenopausal   . Psoriasis   . Renal calculus, left   . Sigmoid diverticulosis   . Vitamin D deficiency    Past Surgical History:  Procedure Laterality Date  . CLOSED MANIPULATION SHOULDER    . COLONOSCOPY WITH PROPOFOL  01-25-2014  . CYSTO/  BILATERAL RETROGRADE PYELOGRAM/  BILATERAL URETEROSCOPIC LASER LITHOTRIPSY STONE EXTRACTIONS/  BILATERAL STENT PLACEMENT  02-13-2010  . CYSTOSCOPY WITH RETROGRADE PYELOGRAM, URETEROSCOPY AND STENT PLACEMENT Left 04/21/2015   Procedure: CYSTOSCOPY WITH RETROGRADE PYELOGRAM, URETEROSCOPY, STONE EXTRACTION AND STENT PLACEMENT;  Surgeon: Franchot Gallo, MD;  Location: [facility];  Service: Urology;  Laterality: Left;  . CYSTOURETHROSCOPY/  PLACEMENT LYNX SUPRAPUBIC SLING  05-24-2008  . HAND TENDON SURGERY Right 1993  . HOLMIUM LASER APPLICATION Left 07/18/2670   Procedure: HOLMIUM LASER APPLICATION;  Surgeon: Franchot Gallo, MD;  Location: [facility];  Service: Urology;  Laterality: Left;  . LAPAROSCOPIC CHOLECYSTECTOMY  01-31-2002  . LEFT URETEROSCOPIC STONE EXTRACTION/  STENT PLACEMENT  10-06-2005  . RHINOPLASTY  2007  . STRABISMUS SURGERY Left age  18  . TOTAL KNEE ARTHROPLASTY Left 10/27/2016   Procedure: TOTAL KNEE ARTHROPLASTY;  Surgeon: [name], MD;  Location: [facility];  Service: Orthopedics;  Laterality: Left;  . WISDOM TOOTH EXTRACTION  age 34   Current Outpatient Medications on File Prior to Visit  Medication Sig Dispense Refill  . ammonium lactate (LAC-HYDRIN) 12 % lotion Apply 1 application topically daily.     . Azelaic Acid (FINACEA) 15 % cream Apply 1 application topically daily.     . calcipotriene-betamethasone (TACLONEX) external suspension Apply 1 application topically daily as needed.     [name] esomeprazole (NEXIUM) 40 MG capsule Take 40 mg by mouth daily before breakfast.    . HYDROcodone-acetaminophen (NORCO/VICODIN) 5-325 MG tablet Take 1 tablet by mouth every 6 (six) hours as needed for moderate pain.    [name] ibuprofen (ADVIL,MOTRIN) 600 MG tablet Take 600 mg by mouth every 6 (six) hours as needed.    . Multiple Vitamins-Minerals (MULTIVITAMIN/EXTRA VITAMIN D3) CHEW Chew 2 tablets by mouth daily.    . norethindrone-ethinyl estradiol (FEMHRT 1/5) 1-5 MG-MCG TABS tablet Take 1 tablet by mouth daily.   12  . rosuvastatin (CRESTOR) 10 MG tablet Take 10 mg by mouth daily.     No current facility-administered medications on file prior to visit.   Anona denied a history of head injuries and loss of consciousness.   Mental Health History: Arella reported she received therapeutic services in 2007 secondary to a relationship ending. She explained during this time, she attended two sessions of couples counseling. Prior to that, Maeson explained in her late 53s, she attended individual therapy due to "being down and depressed and relationship issues." Aaryanna denied ever being hospitalized for psychiatric reasons and has never seen a psychiatrist. Previously, Dijon stated her primary care physician prescribed Wellbutrin, which she "did not take for that long." It was approximately four years ago. Jayma indicated a belief that her mother  had a history of depression. Lerae denied a trauma history, including sexual, physical, and psychological abuse.   Beata reported experiencing the following: anhedonia; social isolation; feeling down; irritability; frustration and stress with work; difficulty staying asleep; fatigue; cravings; decreased self-esteem; and worry thoughts about work and her daughter. She attributed sleep difficulties to menopause and indicated she averages four hours of sleep a night. Latica denied experiencing the following: hopelessness; obsessions and compulsions; mania; attention and concentration issues; memory concerns; hallucinations and delusions; substance use; history of and current engagement in self-harm; and history of and current suicidal and homicidal ideation, plan, and intent.   Structured Assessment Results: Notably, Alysha completed the PHQ-9 and GAD-7 when she first received the new patient packet. Thus, she reviewed her responses to reflect the past two weeks.   The Patient Health Questionnaire-9 (PHQ-9) is a self-report measure that assesses symptoms and severity of depression over the course of the last two weeks. Latria obtained a score of nine suggesting mild depression. Jamileth finds the endorsed symptoms to be somewhat difficult. Depression screen PHQ 2/9 08/24/2018  Decreased Interest 1  Down, Depressed, Hopeless 1  PHQ - 2 Score 2  Altered sleeping 3  Tired, decreased energy 2  Change in appetite 1  Feeling bad or failure about yourself  1  Trouble concentrating 0  Moving slowly  or fidgety/restless 0  Suicidal thoughts 0  PHQ-9 Score 9  Difficult doing work/chores -   The Generalized Anxiety Disorder-7 (GAD-7) is a brief self-report measure that assesses symptoms of anxiety over the course of the last two weeks. Rox obtained a score of four suggesting minimal anxiety.  GAD 7 : Generalized Anxiety Score 08/24/2018  Nervous, Anxious, on Edge 0  Control/stop worrying 1  Worry too much -  different things 1  Trouble relaxing 1  Restless 0  Easily annoyed or irritable 1  Afraid - awful might happen 0  Total GAD 7 Score 4  Anxiety Difficulty Somewhat difficult   Interventions: A chart review was conducted prior to the clinical intake interview. The MoCA, PHQ-9, and GAD-7 were administered and a clinical intake interview was completed. In addition, Ginger was asked to complete a Mood and Food questionnaire to assess various behaviors related to emotional eating. Throughout session, empathic reflections and validation was provided. Continuing treatment with this provider was discussed and  treatment goals were established. Psychoeducation regarding emotional versus physical hunger was provided. Kinza was given a handout to utilize between now and the next appointment to increase awareness of hunger patterns and subsequent eating. In addition, psychoeducation was provided between the connection between thoughts, feelings, and behaviors. She was introduced to pleasurable activities (I.e., behaviors) and encouraged to identify activities she would be willing to engage in. Handouts were provided for the aforementioned.  Provisional DSM-5 Diagnosis: 296.31 (F33.0) Major Depressive Disorder, Recurrent Episode, Mild, With Anxious Distress  Plan: Ameah expressed understanding and agreement with the initial treatment plan of care. She appears able and willing to participate as evidenced by collaboration on a treatment goal, engagement in reciprocal conversation, and asking questions as needed for clarification. The next appointment will be scheduled in two weeks. The following treatment goals were established: decrease emotional eating and increase coping skills. For the aforementioned goal, Verbie can benefit from biweekly sessions that are brief in duration for approximately four to six sessions.

## 2018-08-24 ENCOUNTER — Ambulatory Visit (INDEPENDENT_AMBULATORY_CARE_PROVIDER_SITE_OTHER): Payer: 59 | Admitting: Family Medicine

## 2018-08-24 ENCOUNTER — Ambulatory Visit (INDEPENDENT_AMBULATORY_CARE_PROVIDER_SITE_OTHER): Payer: 59 | Admitting: Psychology

## 2018-08-24 VITALS — BP 136/86 | HR 78 | Temp 97.4°F | Ht 66.0 in | Wt 224.0 lb

## 2018-08-24 DIAGNOSIS — E8881 Metabolic syndrome: Secondary | ICD-10-CM

## 2018-08-24 DIAGNOSIS — Z9189 Other specified personal risk factors, not elsewhere classified: Secondary | ICD-10-CM

## 2018-08-24 DIAGNOSIS — E559 Vitamin D deficiency, unspecified: Secondary | ICD-10-CM | POA: Diagnosis not present

## 2018-08-24 DIAGNOSIS — F330 Major depressive disorder, recurrent, mild: Secondary | ICD-10-CM

## 2018-08-24 DIAGNOSIS — Z6837 Body mass index (BMI) 37.0-37.9, adult: Secondary | ICD-10-CM | POA: Diagnosis not present

## 2018-08-24 MED ORDER — VITAMIN D (ERGOCALCIFEROL) 1.25 MG (50000 UNIT) PO CAPS: 50000.0000 [IU] | ORAL_CAPSULE | ORAL | 0 refills | Status: DC

## 2018-08-24 MED FILL — VIT D2 1.25 MG (50,000 UNIT: 1.25 MG | 35 days supply | Qty: 5 | Fill #0

## 2018-08-24 MED FILL — ALPRAZolam 0.25 MG TABS: 0.25 | 30 days supply | Qty: 30 | Fill #1

## 2018-08-29 NOTE — Progress Notes (Signed)
Office: [phone]  /  Fax: [phone]   HPI:   Chief Complaint: OBESITY Dana Williams is here to discuss her progress with her obesity treatment plan. She is on the Category 3 plan and is following her eating plan approximately 70 to 75 % of the time. She states she is exercising 0 minutes 0 times per week. [name] did very well with weight loss on her Category 3 plan, but she didn't eat all her food and she felt somewhat deprived. [name] is still struggling with emotional eating as well, but she is now seeing Dr. Mallie Mussel for therapy. Her weight is 224 lb (101.6 kg) today and has had a weight loss of 12 pounds over a period of 2 weeks since her last visit. She has lost 12 lbs since starting treatment with [name].  Vitamin D deficiency (new) [name] has a new diagnosis of vitamin D deficiency. [name] is not currently taking vitamin D and she is not at goal. Chimamanda admits to fatigue and denies nausea, vomiting or muscle weakness.  Insulin Resistance (new) Reagen has a new diagnosis of insulin resistance. [name] has a normal A1c and glucose, but she has a elevated fasting insulin level >5. Although [name]'s blood glucose readings are still under good control, insulin resistance puts her at greater risk of metabolic syndrome and diabetes. She is not taking metformin currently and continues to work on diet and exercise to decrease risk of diabetes. [name] admits to polyphagia.  At risk for diabetes [name] is at higher than average risk for developing diabetes due to her obesity and insulin resistance. She currently denies polyuria or polydipsia.  ALLERGIES: Allergies  Allergen Reactions  . Sulfa Antibiotics Other (See Comments)    Mouth ulcers    MEDICATIONS: Current Outpatient Medications on File Prior to Visit  Medication Sig Dispense Refill  . ammonium lactate (LAC-HYDRIN) 12 % lotion Apply 1 application topically daily.     . Azelaic Acid (FINACEA) 15 % cream Apply 1 application topically daily.     .  calcipotriene-betamethasone (TACLONEX) external suspension Apply 1 application topically daily as needed.     [name] esomeprazole (NEXIUM) 40 MG capsule Take 40 mg by mouth daily before breakfast.    . HYDROcodone-acetaminophen (NORCO/VICODIN) 5-325 MG tablet Take 1 tablet by mouth every 6 (six) hours as needed for moderate pain.    [name] ibuprofen (ADVIL,MOTRIN) 600 MG tablet Take 600 mg by mouth every 6 (six) hours as needed.    . Multiple Vitamins-Minerals (MULTIVITAMIN/EXTRA VITAMIN D3) CHEW Chew 2 tablets by mouth daily.    . norethindrone-ethinyl estradiol (FEMHRT 1/5) 1-5 MG-MCG TABS tablet Take 1 tablet by mouth daily.   12  . rosuvastatin (CRESTOR) 10 MG tablet Take 10 mg by mouth daily.     No current facility-administered medications on file prior to visit.     PAST MEDICAL HISTORY: Past Medical History:  Diagnosis Date  . Arthritis   . Arthritis   . Arthritis of pelvic region   . Depression   . Dry skin   . Fatigue   . GERD (gastroesophageal reflux disease)   . H/O psoriatic arthritis    DX  age 57---- in remission for 20 years  . History of hiatal hernia   . History of kidney stones   . Hyperlipidemia   . Joint pain   . Kidney stones   . Obesity   . Personal history of colonic polyp - adenoma 01/29/2014  . Plantar fasciitis of left foot   .  PONV (postoperative nausea and vomiting)   . Postmenopausal   . Psoriasis   . Renal calculus, left   . Sigmoid diverticulosis   . Vitamin D deficiency     PAST SURGICAL HISTORY: Past Surgical History:  Procedure Laterality Date  . CLOSED MANIPULATION SHOULDER    . COLONOSCOPY WITH PROPOFOL  01-25-2014  . CYSTO/  BILATERAL RETROGRADE PYELOGRAM/  BILATERAL URETEROSCOPIC LASER LITHOTRIPSY STONE EXTRACTIONS/  BILATERAL STENT PLACEMENT  02-13-2010  . CYSTOSCOPY WITH RETROGRADE PYELOGRAM, URETEROSCOPY AND STENT PLACEMENT Left 04/21/2015   Procedure: CYSTOSCOPY WITH RETROGRADE PYELOGRAM, URETEROSCOPY, STONE EXTRACTION AND STENT PLACEMENT;   Surgeon: Franchot Gallo, MD;  Location: [facility];  Service: Urology;  Laterality: Left;  . CYSTOURETHROSCOPY/  PLACEMENT LYNX SUPRAPUBIC SLING  05-24-2008  . HAND TENDON SURGERY Right 1993  . HOLMIUM LASER APPLICATION Left 4/0/9735   Procedure: HOLMIUM LASER APPLICATION;  Surgeon: Franchot Gallo, MD;  Location: [facility];  Service: Urology;  Laterality: Left;  . LAPAROSCOPIC CHOLECYSTECTOMY  01-31-2002  . LEFT URETEROSCOPIC STONE EXTRACTION/  STENT PLACEMENT  10-06-2005  . RHINOPLASTY  2007  . STRABISMUS SURGERY Left age 68  . TOTAL KNEE ARTHROPLASTY Left 10/27/2016   Procedure: TOTAL KNEE ARTHROPLASTY;  Surgeon: [name], MD;  Location: [place];  Service: Orthopedics;  Laterality: Left;  . WISDOM TOOTH EXTRACTION  age 57    SOCIAL HISTORY: Social History   Tobacco Use  . Smoking status: Never Smoker  . Smokeless tobacco: Never Used  Substance Use Topics  . Alcohol use: No  . Drug use: No    FAMILY HISTORY: Family History  Problem Relation Age of Onset  . Stomach cancer Paternal Grandmother   . Hypertension Mother   . Hyperlipidemia Mother   . Thyroid disease Mother   . Obesity Mother   . Colon cancer Neg Hx   . Esophageal cancer Neg Hx   . Rectal cancer Neg Hx     ROS: Review of Systems  Constitutional: Positive for malaise/fatigue and weight loss.  Gastrointestinal: Negative for nausea and vomiting.  Genitourinary: Negative for frequency.  Musculoskeletal:       Negative for muscle weakness  Endo/Heme/Allergies: Negative for polydipsia.    PHYSICAL EXAM: Blood pressure 136/86, pulse 78, temperature (!) 97.4 F (36.3 C), temperature source Oral, height 5\' 6"  (1.676 m), weight 224 lb (101.6 kg), SpO2 100 %. Body mass index is 36.15 kg/m. Physical Exam  Constitutional: She is oriented to person, place, and time. She appears well-developed and well-nourished.  Cardiovascular: Normal rate.  Pulmonary/Chest: Effort  normal.  Musculoskeletal: Normal range of motion.  Neurological: She is oriented to person, place, and time.  Skin: Skin is warm and dry.  Psychiatric: She has a normal mood and affect. Her behavior is normal.  Vitals reviewed.   RECENT LABS AND TESTS: BMET    Component Value Date/Time   NA 143 08/10/2018 1231   K 4.1 08/10/2018 1231   CL 103 08/10/2018 1231   CO2 23 08/10/2018 1231   GLUCOSE 87 08/10/2018 1231   GLUCOSE 129 (H) 10/28/2016 0530   BUN 10 08/10/2018 1231   CREATININE 0.73 08/10/2018 1231   CALCIUM 9.3 08/10/2018 1231   GFRNONAA 91 08/10/2018 1231   GFRAA 105 08/10/2018 1231   Lab Results  Component Value Date   HGBA1C 5.4 08/10/2018   Lab Results  Component Value Date   INSULIN 10.9 08/10/2018   CBC    Component Value Date/Time   WBC 4.7 08/10/2018  1231   WBC 7.2 10/28/2016 0530   RBC 4.03 08/10/2018 1231   RBC 3.86 (L) 10/28/2016 0530   HGB 11.0 (L) 08/10/2018 1231   HCT 35.6 08/10/2018 1231   PLT 213 10/28/2016 0530   MCV 88 08/10/2018 1231   MCH 27.3 08/10/2018 1231   MCH 29.5 10/28/2016 0530   MCHC 30.9 (L) 08/10/2018 1231   MCHC 32.3 10/28/2016 0530   RDW 13.8 08/10/2018 1231   LYMPHSABS 1.7 08/10/2018 1231   EOSABS 0.1 08/10/2018 1231   BASOSABS 0.0 08/10/2018 1231   Iron/TIBC/Ferritin/ %Sat No results found for: IRON, TIBC, FERRITIN, IRONPCTSAT Lipid Panel     Component Value Date/Time   CHOL 182 08/10/2018 1231   TRIG 116 08/10/2018 1231   HDL 89 08/10/2018 1231   LDLCALC 70 08/10/2018 1231   Hepatic Function Panel     Component Value Date/Time   PROT 6.5 08/10/2018 1231   ALBUMIN 4.0 08/10/2018 1231   AST 17 08/10/2018 1231   ALT 18 08/10/2018 1231   ALKPHOS 115 08/10/2018 1231   BILITOT 0.3 08/10/2018 1231      Component Value Date/Time   TSH 1.990 08/10/2018 1231   Results for MIXTLI, RENO (MRN [id number]) as of 08/29/2018 07:18  Ref. Range 08/10/2018 12:31  Vitamin D, 25-Hydroxy Latest Ref Range: 30.0 - 100.0  ng/mL 33.9   ASSESSMENT AND PLAN: Vitamin D deficiency - Plan: Vitamin D, Ergocalciferol, (DRISDOL) 50000 units CAPS capsule  Insulin resistance  At risk for diabetes mellitus  Class 2 severe obesity with serious comorbidity and body mass index (BMI) of 37.0 to 37.9 in adult, unspecified obesity type ([place])  PLAN:  Vitamin D Deficiency (new) Hajar was informed that low vitamin D levels contributes to fatigue and are associated with obesity, breast, and colon cancer. She agrees to start prescription Vit D @50 ,000 IU every week #4 with no refills and will follow up for routine testing of vitamin D, at least 2-3 times per year. She was informed of the risk of over-replacement of vitamin D and agrees to not increase her dose unless she discusses this with [name] first. Shawnie agrees to follow up as directed.  Insulin Resistance (new) Brionne will continue to work on weight loss, exercise, and decreasing simple carbohydrates in her diet to help decrease the risk of diabetes. She was informed that eating too many simple carbohydrates or too many calories at one sitting increases the likelihood of GI side effects Briyana agreed to eat all of her food and follow up with [name] as directed to monitor her progress.  Diabetes risk counseling Haydyn was given extended (30 minutes) diabetes prevention counseling today. She is 57 y.o. female and has risk factors for diabetes including obesity and insulin resistance. We discussed intensive lifestyle modifications today with an emphasis on weight loss as well as increasing exercise and decreasing simple carbohydrates in her diet.  Obesity Althea is currently in the action stage of change. As such, her goal is to continue with weight loss efforts She has agreed to follow the Category 3 plan Jazminn has been instructed to work up to a goal of 150 minutes of combined cardio and strengthening exercise per week for weight loss and overall health benefits. We discussed the  following Behavioral Modification Strategies today: increasing lean protein intake, decreasing simple carbohydrates  and work on meal planning and easy cooking plans  Annalese has agreed to follow up with our clinic in 2 weeks. She was informed of the importance of  frequent follow up visits to maximize her success with intensive lifestyle modifications for her multiple health conditions.   OBESITY BEHAVIORAL INTERVENTION VISIT  Today's visit was # 2   Starting weight: 236 lbs Starting date: 08/10/18 Today's weight : 224 lbs  Today's date: 08/24/2018 Total lbs lost to date: 12   ASK: We discussed the diagnosis of obesity with Berneta Sages today and Ileene Hutchinson agreed to give [name] permission to discuss obesity behavioral modification therapy today.  ASSESS: Kaisey has the diagnosis of obesity and her BMI today is 36.17 Rhema is in the action stage of change   ADVISE: Carmelite was educated on the multiple health risks of obesity as well as the benefit of weight loss to improve her health. She was advised of the need for long term treatment and the importance of lifestyle modifications to improve her current health and to decrease her risk of future health problems.  AGREE: Multiple dietary modification options and treatment options were discussed and  Johnica agreed to follow the recommendations documented in the above note.  ARRANGE: Charleen was educated on the importance of frequent visits to treat obesity as outlined per CMS and USPSTF guidelines and agreed to schedule her next follow up appointment today.  I, Doreene Nest, am acting as transcriptionist for Dennard Nip, MD  I have reviewed the above documentation for accuracy and completeness, and I agree with the above. -Dennard Nip, MD

## 2018-08-30 MED FILL — SHINGRIX 50 MCG SUS: 50 | 1 days supply | Qty: 1 | Fill #1

## 2018-09-08 DIAGNOSIS — Z85828 Personal history of other malignant neoplasm of skin: Secondary | ICD-10-CM | POA: Diagnosis not present

## 2018-09-08 DIAGNOSIS — D485 Neoplasm of uncertain behavior of skin: Secondary | ICD-10-CM | POA: Diagnosis not present

## 2018-09-08 DIAGNOSIS — L570 Actinic keratosis: Secondary | ICD-10-CM | POA: Diagnosis not present

## 2018-09-08 DIAGNOSIS — R21 Rash and other nonspecific skin eruption: Secondary | ICD-10-CM | POA: Diagnosis not present

## 2018-09-11 NOTE — Progress Notes (Signed)
Office: [phone]  /  Fax: [phone]   Date: September 12, 2018 Time Seen: 4:10pm Duration: 30 minutes Provider: Glennie Isle, Psy.D. Type of Session: Individual Therapy   HPI: Dana Williams was referred by Dr. Dennard Nip due to positive depression screen. She was seen for an initial appointment by this provider on August 24, 2018. Per the note for the initial visit with Dr. Dennard Nip on August 10, 2018, "Dana Williams has a positive depression screen and she is not on antidepressants. Her mood is depressed, she feels isolated and feels she has no social life.Dana Williams is working long hours and she knows, she isn't taking care of her health like she should." In addition, per the note for the initial visit with Dr. Dennard Nip on August 10, 2018, Dana Williams has been heavy most of her life and started gaining weight after menopause. Her heaviest weight ever was 230 pounds. During her initial appointment with Dr. Leafy Williams, Dana Williams reported experiencing the following: significant food cravings issues; snacking frequently in the evening; frequently drinking liquids with calories; binge eating behavior; and struggling with emotional eating. During the initial appointment with this provider, Dana Williams explained she gained "signinfant weight" recently. She noted she has tried diets in the past, but "nothing seemed to work." Dana Williams described herself as a "boredom and emotional eater." She discussed her current work schedule make it difficult to make healthier eating choices. Regarding the current meal plan, Dana Williams noted she did not obtain the foods for the meal plan until a few days after her appointment with Dr. Leafy Williams. She indicated that while she has not completely followed the meal plan, she has done "better." Per her scale at home, Dana Williams lost ten pounds. In addition, Dana Williams described craving candy. Dana Williams denied a history of binge eating. She denied a history of purging and engagement in other compensatory strategies.  Dana Williams has never been diagnosed with an eating disorder. Furthermore, Dana Williams was asked to complete a questionnaire assessing various behaviors related to emotional eating. Dana Williams endorsed the following: experience food cravings on a regular basis, eat certain foods when you are anxious, stressed, depressed, or your feelings are hurt, use food to help you cope with emotional situations, find food is comforting to you and overeat frequently when you are bored or lonely.  Session Content: Session focused on the following treatment goals: decrease emotional eating and increase coping skills. The session was initiated with the administration of the PHQ-9 and GAD-7, as well as a brief check-in. Dana Williams reported changes in her work schedule. She indicated she engaged in pleasurable activities, including going to [place] and speaking with friends. Regarding eating, Dana Williams described feeling discouraged with her weight loss. As such, this was explored further and associated thoughts and subsequent feelings were processed. Dana Williams acknowledged that she has been making better choices and tries to choose options at restaurants that are congruent to her meal plan when she goes out. Dana of session focused on triggers for emotional eating. Psychoeducation was provided regarding triggers and Dana Williams was encouraged to identify her frequent triggers between now and the next appointment. As it relates to socialization as a trigger, this provider discussed Dana Williams placing her utensils down when conversing during a meal to avoid mindless eating. Overall, Dana Williams was receptive to today's appointment as evidenced by her openness to sharing, responsiveness to feedback, and willingness to identify her triggers and continue to engage in pleasurable activities.  Mental Status Examination: Dana Williams arrived on time for the appointment; however, the appointment was initiated late due to  this provider. She presented as appropriately dressed and  groomed. Dana Williams appeared her stated age and demonstrated adequate orientation to time, place, person, and purpose of the appointment. She also demonstrated appropriate eye contact. No psychomotor abnormalities or behavioral peculiarities noted. Her mood was euthymic with congruent affect. Her thought processes were logical, linear, and goal-directed. No hallucinations, delusions, bizarre thinking or behavior reported or observed. Judgment, insight, and impulse control appeared to be grossly intact. There was no evidence of paraphasias (i.e., errors in speech, gross mispronunciations, and word substitutions), repetition deficits, or disturbances in volume or prosody (i.e., rhythm and intonation). There was no evidence of attention or memory impairments. Dana Williams denied current suicidal and homicidal ideation, intent or plan.  Structured Assessment Results: The Patient Health Questionnaire-9 (PHQ-9) is a self-report measure that assesses symptoms and severity of depression over the course of the last two weeks. Dana Williams obtained a score of two suggesting minimal depression. Dana Williams finds the endorsed symptoms to be not difficult at all. Depression screen PHQ 2/9 09/12/2018  Decreased Interest 0  Down, Depressed, Hopeless 1  PHQ - 2 Score 1  Altered sleeping 1  Tired, decreased energy 0  Change in appetite 0  Feeling bad or failure about yourself  0  Trouble concentrating 0  Moving slowly or fidgety/restless 0  Suicidal thoughts 0  PHQ-9 Score 2  Difficult doing work/chores -   The Generalized Anxiety Disorder-7 (GAD-7) is a brief self-report measure that assesses symptoms of anxiety over the course of the last two weeks. Dana Williams obtained a score of zero. GAD 7 : Generalized Anxiety Score 09/12/2018  Nervous, Anxious, on Edge 0  Control/stop worrying 0  Worry too much - different things 0  Trouble relaxing 0  Restless 0  Easily annoyed or irritable 0  Afraid - awful might happen 0  Total GAD 7 Score 0    Anxiety Difficulty -   Interventions: Glendia was administered the PHQ-9 and GAD-7 for symptom monitoring. Content from the last session was reviewed. Throughout today's session, empathic reflections and validation were provided. This provider processed Brynleigh's thoughts and feelings associated eating. Psychoeducation regarding triggers for emotional eating was provided and Sherolyn was given a handout.   DSM-5 Diagnosis: 296.31 (F33.0) Major Depressive Disorder, Recurrent Episode, Mild, With Anxious Distress, Mild    Treatment Goal & Progress: Jamisen was seen for an initial appointment with this provider on August 24, 2018 during which the following treatment goals were established: decrease emotional eating and increase coping skills. Rudene has demonstrated progress in her goal of decreasing emotional eating as evidenced by increased awareness of physical versus emotional hunger and willingness to identify her triggers for emotional eating. As it relates to her goal of increasing coping skills, Mitzy reported engaging in pleasurable activities and was agreeable to continuing to engage in pleasurable activities.  Plan: Krislyn continues to appear able and willing to participate as evidenced by engagement in reciprocal conversation, and asking questions for clarification as appropriate. The next appointment will be scheduled in two weeks. The next session will focus on reviewing triggers for emotional eating, and working towards the established treatment goals.

## 2018-09-12 ENCOUNTER — Ambulatory Visit (INDEPENDENT_AMBULATORY_CARE_PROVIDER_SITE_OTHER): Payer: 59 | Admitting: Family Medicine

## 2018-09-12 ENCOUNTER — Ambulatory Visit (INDEPENDENT_AMBULATORY_CARE_PROVIDER_SITE_OTHER): Payer: 59 | Admitting: Psychology

## 2018-09-12 VITALS — BP 123/79 | HR 89 | Temp 97.8°F | Ht 65.0 in | Wt 219.0 lb

## 2018-09-12 DIAGNOSIS — E7849 Other hyperlipidemia: Secondary | ICD-10-CM

## 2018-09-12 DIAGNOSIS — F330 Major depressive disorder, recurrent, mild: Secondary | ICD-10-CM

## 2018-09-12 DIAGNOSIS — Z6836 Body mass index (BMI) 36.0-36.9, adult: Secondary | ICD-10-CM

## 2018-09-13 NOTE — Progress Notes (Signed)
Office: [phone]  /  Fax: [phone]   HPI:   Chief Complaint: OBESITY Dana Williams is here to discuss her progress with her obesity treatment plan. She is on the Category 3 plan and is following her eating plan approximately 80 % of the time. She states she is exercising 0 minutes 0 times per week. Dana Williams continues to do well with weight loss on her Category 3 plan. Her hunger is controlled, but she is getting bored with dinner and would like additional options.  Her weight is 219 lb (99.3 kg) today and has had a weight loss of 5 pounds over a period of 2 weeks since her last visit. She has lost 17 lbs since starting treatment with [name].  Hyperlipidemia Dana Williams has hyperlipidemia and has been trying to improve her cholesterol levels with intensive lifestyle modification including a low saturated fat diet, exercise and weight loss. She is on Crestor 10mg  and she is working on her diet to improve levels. She denies any chest pain or myalgias.  ALLERGIES: Allergies  Allergen Reactions  . Sulfa Antibiotics Other (See Comments)    Mouth ulcers    MEDICATIONS: Current Outpatient Medications on File Prior to Visit  Medication Sig Dispense Refill  . ammonium lactate (LAC-HYDRIN) 12 % lotion Apply 1 application topically daily.     . Azelaic Acid (FINACEA) 15 % cream Apply 1 application topically daily.     . calcipotriene-betamethasone (TACLONEX) external suspension Apply 1 application topically daily as needed.     [name] esomeprazole (NEXIUM) 40 MG capsule Take 40 mg by mouth daily before breakfast.    . Multiple Vitamins-Minerals (MULTIVITAMIN/EXTRA VITAMIN D3) CHEW Chew 2 tablets by mouth daily.    . norethindrone-ethinyl estradiol (FEMHRT 1/5) 1-5 MG-MCG TABS tablet Take 1 tablet by mouth daily.   12  . rosuvastatin (CRESTOR) 10 MG tablet Take 10 mg by mouth daily.    . Vitamin D, Ergocalciferol, (DRISDOL) 50000 units CAPS capsule Take 1 capsule (50,000 Units total) by mouth every 7 (seven)  days. 5 capsule 0   No current facility-administered medications on file prior to visit.     PAST MEDICAL HISTORY: Past Medical History:  Diagnosis Date  . Arthritis   . Arthritis   . Arthritis of pelvic region   . Depression   . Dry skin   . Fatigue   . GERD (gastroesophageal reflux disease)   . H/O psoriatic arthritis    DX  age 32---- in remission for 20 years  . History of hiatal hernia   . History of kidney stones   . Hyperlipidemia   . Joint pain   . Kidney stones   . Obesity   . Personal history of colonic polyp - adenoma 01/29/2014  . Plantar fasciitis of left foot   . PONV (postoperative nausea and vomiting)   . Postmenopausal   . Psoriasis   . Renal calculus, left   . Sigmoid diverticulosis   . Vitamin D deficiency     PAST SURGICAL HISTORY: Past Surgical History:  Procedure Laterality Date  . CLOSED MANIPULATION SHOULDER    . COLONOSCOPY WITH PROPOFOL  01-25-2014  . CYSTO/  BILATERAL RETROGRADE PYELOGRAM/  BILATERAL URETEROSCOPIC LASER LITHOTRIPSY STONE EXTRACTIONS/  BILATERAL STENT PLACEMENT  02-13-2010  . CYSTOSCOPY WITH RETROGRADE PYELOGRAM, URETEROSCOPY AND STENT PLACEMENT Left 04/21/2015   Procedure: CYSTOSCOPY WITH RETROGRADE PYELOGRAM, URETEROSCOPY, STONE EXTRACTION AND STENT PLACEMENT;  Surgeon: Franchot Gallo, MD;  Location: [facility];  Service: Urology;  Laterality: Left;  .  CYSTOURETHROSCOPY/  PLACEMENT LYNX SUPRAPUBIC SLING  05-24-2008  . HAND TENDON SURGERY Right 1993  . HOLMIUM LASER APPLICATION Left 05/22/2422   Procedure: HOLMIUM LASER APPLICATION;  Surgeon: Franchot Gallo, MD;  Location: [facility];  Service: Urology;  Laterality: Left;  . LAPAROSCOPIC CHOLECYSTECTOMY  01-31-2002  . LEFT URETEROSCOPIC STONE EXTRACTION/  STENT PLACEMENT  10-06-2005  . RHINOPLASTY  2007  . STRABISMUS SURGERY Left age 72  . TOTAL KNEE ARTHROPLASTY Left 10/27/2016   Procedure: TOTAL KNEE ARTHROPLASTY;  Surgeon: [name],  MD;  Location: [facility];  Service: Orthopedics;  Laterality: Left;  . WISDOM TOOTH EXTRACTION  age 62    SOCIAL HISTORY: Social History   Tobacco Use  . Smoking status: Never Smoker  . Smokeless tobacco: Never Used  Substance Use Topics  . Alcohol use: No  . Drug use: No    FAMILY HISTORY: Family History  Problem Relation Age of Onset  . Stomach cancer Paternal Grandmother   . Hypertension Mother   . Hyperlipidemia Mother   . Thyroid disease Mother   . Obesity Mother   . Colon cancer Neg Hx   . Esophageal cancer Neg Hx   . Rectal cancer Neg Hx     ROS: Review of Systems  Constitutional: Positive for weight loss.  Cardiovascular: Negative for chest pain.  Musculoskeletal: Negative for myalgias.    PHYSICAL EXAM: Blood pressure 123/79, pulse 89, temperature 97.8 F (36.6 C), temperature source Oral, height 5\' 5"  (1.651 m), weight 219 lb (99.3 kg), SpO2 97 %. Body mass index is 36.44 kg/m. Physical Exam  Constitutional: She appears well-developed and well-nourished.  Cardiovascular: Normal rate.  Pulmonary/Chest: Effort normal.  Musculoskeletal: Normal range of motion.  Skin: Skin is warm and dry.  Psychiatric: She has a normal mood and affect. Her behavior is normal.  Vitals reviewed.   RECENT LABS AND TESTS: BMET    Component Value Date/Time   NA 143 08/10/2018 1231   K 4.1 08/10/2018 1231   CL 103 08/10/2018 1231   CO2 23 08/10/2018 1231   GLUCOSE 87 08/10/2018 1231   GLUCOSE 129 (H) 10/28/2016 0530   BUN 10 08/10/2018 1231   CREATININE 0.73 08/10/2018 1231   CALCIUM 9.3 08/10/2018 1231   GFRNONAA 91 08/10/2018 1231   GFRAA 105 08/10/2018 1231   Lab Results  Component Value Date   HGBA1C 5.4 08/10/2018   Lab Results  Component Value Date   INSULIN 10.9 08/10/2018   CBC    Component Value Date/Time   WBC 4.7 08/10/2018 1231   WBC 7.2 10/28/2016 0530   RBC 4.03 08/10/2018 1231   RBC 3.86 (L) 10/28/2016 0530   HGB 11.0 (L) 08/10/2018 1231    HCT 35.6 08/10/2018 1231   PLT 213 10/28/2016 0530   MCV 88 08/10/2018 1231   MCH 27.3 08/10/2018 1231   MCH 29.5 10/28/2016 0530   MCHC 30.9 (L) 08/10/2018 1231   MCHC 32.3 10/28/2016 0530   RDW 13.8 08/10/2018 1231   LYMPHSABS 1.7 08/10/2018 1231   EOSABS 0.1 08/10/2018 1231   BASOSABS 0.0 08/10/2018 1231   Iron/TIBC/Ferritin/ %Sat No results found for: IRON, TIBC, FERRITIN, IRONPCTSAT Lipid Panel     Component Value Date/Time   CHOL 182 08/10/2018 1231   TRIG 116 08/10/2018 1231   HDL 89 08/10/2018 1231   LDLCALC 70 08/10/2018 1231   Hepatic Function Panel     Component Value Date/Time   PROT 6.5 08/10/2018 1231   ALBUMIN 4.0  08/10/2018 1231   AST 17 08/10/2018 1231   ALT 18 08/10/2018 1231   ALKPHOS 115 08/10/2018 1231   BILITOT 0.3 08/10/2018 1231      Component Value Date/Time   TSH 1.990 08/10/2018 1231   Results for VIVICA, DOBOSZ (MRN [id number]) as of 09/13/2018 16:42  Ref. Range 08/10/2018 12:31  Vitamin D, 25-Hydroxy Latest Ref Range: 30.0 - 100.0 ng/mL 33.9   ASSESSMENT AND PLAN: Other hyperlipidemia  Class 2 severe obesity with serious comorbidity and body mass index (BMI) of 36.0 to 36.9 in adult, unspecified obesity type ([place])  PLAN:  Hyperlipidemia Dana Williams was informed of the American Heart Association Guidelines emphasizing intensive lifestyle modifications as the first line treatment for hyperlipidemia. We discussed many lifestyle modifications today in depth, and [place] will continue to work on decreasing saturated fats such as fatty red meat, butter and many fried foods. She will also increase vegetables and lean protein in her diet and continue to work on exercise and weight loss efforts. Dana Williams agrees to continue with her diet and Crestor. We will recheck labs in 2 months. She will follow up as directed in 2 to 3 weeks.  I spent > than 50% of the 15 minute visit on counseling as documented in the note.  Obesity Dana Williams is currently in the action  stage of change. As such, her goal is to continue with weight loss efforts She has agreed to follow the Category 3 plan and keeping a food journal of 400 to 600 calories and 40+ grams of protein for supper. Dana Williams has been instructed to work up to a goal of 150 minutes of combined cardio and strengthening exercise per week for weight loss and overall health benefits. We discussed the following Behavioral Modification Strategies today: increasing lean protein intake and decreasing simple carbohydrates.   Dana Williams has agreed to follow up with our clinic in 2 to 3 weeks. She was informed of the importance of frequent follow up visits to maximize her success with intensive lifestyle modifications for her multiple health conditions.   OBESITY BEHAVIORAL INTERVENTION VISIT  Today's visit was # 3   Starting weight: 236 lbs Starting date: 08/10/18 Today's weight : Weight: 219 lb (99.3 kg)  Today's date: 09/12/2018 Total lbs lost to date: 17  ASK: We discussed the diagnosis of obesity with Berneta Sages today and Ileene Hutchinson agreed to give [name] permission to discuss obesity behavioral modification therapy today.  ASSESS: Tabbatha has the diagnosis of obesity and her BMI today is 36.44. Kassie is in the action stage of change.   ADVISE: Talea was educated on the multiple health risks of obesity as well as the benefit of weight loss to improve her health. She was advised of the need for long term treatment and the importance of lifestyle modifications to improve her current health and to decrease her risk of future health problems.  AGREE: Multiple dietary modification options and treatment options were discussed and Toi agreed to follow the recommendations documented in the above note.  ARRANGE: Dakoda was educated on the importance of frequent visits to treat obesity as outlined per CMS and USPSTF guidelines and agreed to schedule her next follow up appointment today.  I, Marcille Blanco, am acting as  transcriptionist for Starlyn Skeans, MD  I have reviewed the above documentation for accuracy and completeness, and I agree with the above. -Dennard Nip, MD

## 2018-09-14 ENCOUNTER — Encounter (INDEPENDENT_AMBULATORY_CARE_PROVIDER_SITE_OTHER): Payer: Self-pay | Admitting: Family Medicine

## 2018-09-14 DIAGNOSIS — H52223 Regular astigmatism, bilateral: Secondary | ICD-10-CM | POA: Diagnosis not present

## 2018-09-14 DIAGNOSIS — H0011 Chalazion right upper eyelid: Secondary | ICD-10-CM | POA: Diagnosis not present

## 2018-09-14 DIAGNOSIS — H25013 Cortical age-related cataract, bilateral: Secondary | ICD-10-CM | POA: Diagnosis not present

## 2018-09-14 DIAGNOSIS — H5213 Myopia, bilateral: Secondary | ICD-10-CM | POA: Diagnosis not present

## 2018-09-14 DIAGNOSIS — H524 Presbyopia: Secondary | ICD-10-CM | POA: Diagnosis not present

## 2018-09-15 ENCOUNTER — Encounter (INDEPENDENT_AMBULATORY_CARE_PROVIDER_SITE_OTHER): Payer: Self-pay | Admitting: Family Medicine

## 2018-09-18 NOTE — Telephone Encounter (Signed)
Can you address this?

## 2018-10-02 NOTE — Progress Notes (Signed)
Office: [phone]  /  Fax: [phone]   Date: October 03, 2018 Time Seen: 9:32am Duration: 32 minutes Provider: Glennie Isle, Psy.D. Type of Session: Individual Therapy   HPI: Karolwas referred by Dr. Lillia Carmel to positive depression screen. She was seen for an initial appointment by this provider on August 24, 2018. Per the note for the initial visit withDr. Desmond Dike August 10, 2018,"Dana Williams has a positive depression screen and she is not on antidepressants. Her mood is depressed, she feels isolated and feels she has no social life.Dana Williams is working long hours and she knows, she isn't taking care of her health like she should."In addition, per the note for the initial visit withDr. Desmond Dike August 10, 2018,Dana Williams been heavy most of her life and started gaining weight after menopause. Her heaviest weight ever was 230 pounds. During her initial appointment with Dr. Trinda Pascal reported experiencing the following:significant food cravings issues; snacking frequently in the evening; frequently drinking liquids with calories; binge eating behavior; and struggling with emotional eating.During the initial appointment with this provider, Dana Williams explained she gained "signinfant weight" recently. She noted she has tried diets in the past, but "nothing seemed to work." Dana Williams described herself as a "boredom and emotional eater." She discussed her current work schedule make it difficult to make healthier eating choices. Regarding the current meal plan, [name] noted she did not obtain the foods for the meal plan until a few days after her appointment with Dr. [name]. She indicated that while she has not completely followed the meal plan, she has done "better." Per her scale at home, Dana Williams lost ten pounds. In addition, Dana Williams described craving candy. Dana Williams denied a history of binge eating. She denied a history of purging and engagement in other compensatory strategies.  Dana Williams has never been diagnosed with an eating disorder.Furthermore, Dana Williams asked to complete a questionnaire assessing various behaviors related to emotional eating. Dana Williams the following: experience food cravings on a regular basis, eat certain foods when you are anxious, stressed, depressed, or your feelings are hurt, use food to help you cope with emotional situations, find food is comforting to you and overeat frequently when you are bored or lonely.  Session Content: Session focused on the following treatment goals: decrease emotional eating and increase coping skills. The session was initiated with the administration of the PHQ-9 and GAD-7, as well as a brief check-in. Dana Williams reported she continues to follow the prescribed meal plan, and noted an increase in her protein intake. In addition, she reported she joined a gym, is making healthier choices when eating out, is engaging in pleasurable activities, and feels "happy" and "good." As such, [name] noted, "I don't think I need you." This was explored further, and Dana Williams agreed to follow-up in four weeks with this provider as a check-in. Session then focused on behavioral strategies for Thanksgiving. A handout was provided, and this provider discussed ways to generalize the strategies to other holidays and events. Moreover, psychoeducation regarding mindfulness was provided. Dana Williams was given a handout and encouraged to engage in mindfulness between now and the next appointment. She agreed. Overall, Dana Williams was receptive to today's session as evidenced by her openness to sharing, responsiveness to feedback, and willingness to engage in mindfulness.   Mental Status Examination: Dana Williams arrived on time for the appointment; however, the appointment was initiated a couple minutes late due to this provider. She presented as appropriately dressed and groomed. Dana Williams appeared her stated age and demonstrated adequate orientation to time, place, person, and purpose  of the appointment. She also demonstrated appropriate eye contact. No psychomotor abnormalities or behavioral peculiarities noted. Her mood was euthymic with congruent affect. Her thought processes were logical, linear, and goal-directed. No hallucinations, delusions, bizarre thinking or behavior reported or observed. Judgment, insight, and impulse control appeared to be grossly intact. There was no evidence of paraphasias (i.e., errors in speech, gross mispronunciations, and word substitutions), repetition deficits, or disturbances in volume or prosody (i.e., rhythm and intonation). There was no evidence of attention or memory impairments. Dana Williams denied current suicidal and homicidal ideation, intent or plan.  Structured Assessment Results: The Patient Health Questionnaire-9 (PHQ-9) is a self-report measure that assesses symptoms and severity of depression over the course of the last two weeks. Dana Williams obtained a score of zero. Depression screen PHQ 2/9 10/03/2018  Decreased Interest 0  Down, Depressed, Hopeless 0  PHQ - 2 Score 0  Altered sleeping 0  Tired, decreased energy 0  Change in appetite 0  Feeling bad or failure about yourself  0  Trouble concentrating 0  Moving slowly or fidgety/restless 0  Suicidal thoughts 0  PHQ-9 Score 0  Difficult doing work/chores -   The Generalized Anxiety Disorder-7 (GAD-7) is a brief self-report measure that assesses symptoms of anxiety over the course of the last two weeks. Dana Williams obtained a score of zero. GAD 7 : Generalized Anxiety Score 10/03/2018  Nervous, Anxious, on Edge 0  Control/stop worrying 0  Worry too much - different things 0  Trouble relaxing 0  Restless 0  Easily annoyed or irritable 0  Afraid - awful might happen 0  Total GAD 7 Score 0  Anxiety Difficulty -   Interventions: Dana Williams was administered the PHQ-9 and GAD-7 for symptom monitoring. Content from the last session was reviewed. Throughout today's session, empathic reflections and  validation were provided. Psychoeducation regarding behavioral strategies for Thanksgiving as well as mindfulness was provided.  DSM-5 Diagnosis: 296.31 (F33.0) Major Depressive Disorder, Recurrent Episode, Mild, With Anxious Distress, Mild   Treatment Goal & Progress: Dana Williams was seen for an initial appointment with this provider on August 24, 2018 during which the following treatment goal were established: decrease emotional eating and increase coping skills. Dana Williams has demonstrated progress in her goal of decreasing emotional eating as evidenced by her increased awareness of hunger patterns and triggers for emotional eating. She has also demonstrated progress in her goal of increasing coping skills as evidenced by her continued engagement in pleasurable activities and willingness to engage in mindfulness.   Plan: Dana Williams continues to appear able and willing to participate as evidenced by engagement in reciprocal conversation, and asking questions for clarification as appropriate. The next appointment will be scheduled in one month. The next session will focus further or mindfulness, and potential termination.

## 2018-10-03 ENCOUNTER — Ambulatory Visit (INDEPENDENT_AMBULATORY_CARE_PROVIDER_SITE_OTHER): Payer: 59 | Admitting: Psychology

## 2018-10-03 ENCOUNTER — Ambulatory Visit (INDEPENDENT_AMBULATORY_CARE_PROVIDER_SITE_OTHER): Payer: 59 | Admitting: Family Medicine

## 2018-10-03 VITALS — BP 113/71 | HR 85 | Ht 65.0 in | Wt 215.0 lb

## 2018-10-03 DIAGNOSIS — Z9189 Other specified personal risk factors, not elsewhere classified: Secondary | ICD-10-CM | POA: Diagnosis not present

## 2018-10-03 DIAGNOSIS — F330 Major depressive disorder, recurrent, mild: Secondary | ICD-10-CM | POA: Diagnosis not present

## 2018-10-03 DIAGNOSIS — Z6835 Body mass index (BMI) 35.0-35.9, adult: Secondary | ICD-10-CM

## 2018-10-03 DIAGNOSIS — E559 Vitamin D deficiency, unspecified: Secondary | ICD-10-CM | POA: Diagnosis not present

## 2018-10-03 MED ORDER — VITAMIN D (ERGOCALCIFEROL) 1.25 MG (50000 UNIT) PO CAPS: 50000.0000 [IU] | ORAL_CAPSULE | ORAL | 0 refills | Status: DC

## 2018-10-03 MED FILL — VIT D2 1.25 MG (50,000 UNIT: 1.25 MG | 35 days supply | Qty: 5 | Fill #0

## 2018-10-04 NOTE — Progress Notes (Signed)
Office: [phone]  /  Fax: [phone]   HPI:   Chief Complaint: OBESITY Dana Williams is here to discuss her progress with her obesity treatment plan. She is on the Category 3 plan and is following her eating plan approximately 90 to 95 % of the time. She states she is exercising 0 minutes 0 times per week. Dana Williams continues to do well with weight loss on her Category 3 plan. She states hunger is controlled and she is doing much better with meal planning and prepping.  Her weight is 215 lb (97.5 kg) today and has had a weight loss of 4 pounds over a period of 3 weeks since her last visit. She has lost 21 lbs since starting treatment with [name].  Vitamin D deficiency Dana Williams has a diagnosis of vitamin D deficiency. She is currently taking vit D and is stable. She denies nausea, vomiting, or muscle weakness.  At risk for osteopenia and osteoporosis Dana Williams is at higher risk of osteopenia and osteoporosis due to vitamin D deficiency.   ALLERGIES: Allergies  Allergen Reactions  . Sulfa Antibiotics Other (See Comments)    Mouth ulcers    MEDICATIONS: Current Outpatient Medications on File Prior to Visit  Medication Sig Dispense Refill  . ammonium lactate (LAC-HYDRIN) 12 % lotion Apply 1 application topically daily.     . Azelaic Acid (FINACEA) 15 % cream Apply 1 application topically daily.     . calcipotriene-betamethasone (TACLONEX) external suspension Apply 1 application topically daily as needed.     [name] esomeprazole (NEXIUM) 40 MG capsule Take 40 mg by mouth daily before breakfast.    . Multiple Vitamins-Minerals (MULTIVITAMIN/EXTRA VITAMIN D3) CHEW Chew 2 tablets by mouth daily.    . norethindrone-ethinyl estradiol (FEMHRT 1/5) 1-5 MG-MCG TABS tablet Take 1 tablet by mouth daily.   12  . rosuvastatin (CRESTOR) 10 MG tablet Take 10 mg by mouth daily.     No current facility-administered medications on file prior to visit.     PAST MEDICAL HISTORY: Past Medical History:  Diagnosis Date    . Arthritis   . Arthritis   . Arthritis of pelvic region   . Depression   . Dry skin   . Fatigue   . GERD (gastroesophageal reflux disease)   . H/O psoriatic arthritis    DX  age 45---- in remission for 20 years  . History of hiatal hernia   . History of kidney stones   . Hyperlipidemia   . Joint pain   . Kidney stones   . Obesity   . Personal history of colonic polyp - adenoma 01/29/2014  . Plantar fasciitis of left foot   . PONV (postoperative nausea and vomiting)   . Postmenopausal   . Psoriasis   . Renal calculus, left   . Sigmoid diverticulosis   . Vitamin D deficiency     PAST SURGICAL HISTORY: Past Surgical History:  Procedure Laterality Date  . CLOSED MANIPULATION SHOULDER    . COLONOSCOPY WITH PROPOFOL  01-25-2014  . CYSTO/  BILATERAL RETROGRADE PYELOGRAM/  BILATERAL URETEROSCOPIC LASER LITHOTRIPSY STONE EXTRACTIONS/  BILATERAL STENT PLACEMENT  02-13-2010  . CYSTOSCOPY WITH RETROGRADE PYELOGRAM, URETEROSCOPY AND STENT PLACEMENT Left 04/21/2015   Procedure: CYSTOSCOPY WITH RETROGRADE PYELOGRAM, URETEROSCOPY, STONE EXTRACTION AND STENT PLACEMENT;  Surgeon: Franchot Gallo, MD;  Location: [facility];  Service: Urology;  Laterality: Left;  . CYSTOURETHROSCOPY/  PLACEMENT LYNX SUPRAPUBIC SLING  05-24-2008  . HAND TENDON SURGERY Right 1993  . HOLMIUM LASER APPLICATION Left  04/21/2015   Procedure: HOLMIUM LASER APPLICATION;  Surgeon: Franchot Gallo, MD;  Location: [facility];  Service: Urology;  Laterality: Left;  . LAPAROSCOPIC CHOLECYSTECTOMY  01-31-2002  . LEFT URETEROSCOPIC STONE EXTRACTION/  STENT PLACEMENT  10-06-2005  . RHINOPLASTY  2007  . STRABISMUS SURGERY Left age 33  . TOTAL KNEE ARTHROPLASTY Left 10/27/2016   Procedure: TOTAL KNEE ARTHROPLASTY;  Surgeon: [name], MD;  Location: [facility];  Service: Orthopedics;  Laterality: Left;  . WISDOM TOOTH EXTRACTION  age 53    SOCIAL HISTORY: Social History   Tobacco Use  .  Smoking status: Never Smoker  . Smokeless tobacco: Never Used  Substance Use Topics  . Alcohol use: No  . Drug use: No    FAMILY HISTORY: Family History  Problem Relation Age of Onset  . Stomach cancer Paternal Grandmother   . Hypertension Mother   . Hyperlipidemia Mother   . Thyroid disease Mother   . Obesity Mother   . Colon cancer Neg Hx   . Esophageal cancer Neg Hx   . Rectal cancer Neg Hx     ROS: Review of Systems  Constitutional: Positive for weight loss.  Gastrointestinal: Negative for nausea and vomiting.  Musculoskeletal:       Negative for muscle weakness.    PHYSICAL EXAM: Blood pressure 113/71, pulse 85, height 5\' 5"  (1.651 m), weight 215 lb (97.5 kg), SpO2 97 %. Body mass index is 35.78 kg/m. Physical Exam  Constitutional: She is oriented to person, place, and time. She appears well-developed and well-nourished.  Cardiovascular: Normal rate.  Pulmonary/Chest: Effort normal.  Musculoskeletal: Normal range of motion.  Neurological: She is oriented to person, place, and time.  Skin: Skin is warm and dry.  Psychiatric: She has a normal mood and affect. Her behavior is normal.  Vitals reviewed.   RECENT LABS AND TESTS: BMET    Component Value Date/Time   NA 143 08/10/2018 1231   K 4.1 08/10/2018 1231   CL 103 08/10/2018 1231   CO2 23 08/10/2018 1231   GLUCOSE 87 08/10/2018 1231   GLUCOSE 129 (H) 10/28/2016 0530   BUN 10 08/10/2018 1231   CREATININE 0.73 08/10/2018 1231   CALCIUM 9.3 08/10/2018 1231   GFRNONAA 91 08/10/2018 1231   GFRAA 105 08/10/2018 1231   Lab Results  Component Value Date   HGBA1C 5.4 08/10/2018   Lab Results  Component Value Date   INSULIN 10.9 08/10/2018   CBC    Component Value Date/Time   WBC 4.7 08/10/2018 1231   WBC 7.2 10/28/2016 0530   RBC 4.03 08/10/2018 1231   RBC 3.86 (L) 10/28/2016 0530   HGB 11.0 (L) 08/10/2018 1231   HCT 35.6 08/10/2018 1231   PLT 213 10/28/2016 0530   MCV 88 08/10/2018 1231   MCH  27.3 08/10/2018 1231   MCH 29.5 10/28/2016 0530   MCHC 30.9 (L) 08/10/2018 1231   MCHC 32.3 10/28/2016 0530   RDW 13.8 08/10/2018 1231   LYMPHSABS 1.7 08/10/2018 1231   EOSABS 0.1 08/10/2018 1231   BASOSABS 0.0 08/10/2018 1231   Iron/TIBC/Ferritin/ %Sat No results found for: IRON, TIBC, FERRITIN, IRONPCTSAT Lipid Panel     Component Value Date/Time   CHOL 182 08/10/2018 1231   TRIG 116 08/10/2018 1231   HDL 89 08/10/2018 1231   LDLCALC 70 08/10/2018 1231   Hepatic Function Panel     Component Value Date/Time   PROT 6.5 08/10/2018 1231   ALBUMIN 4.0 08/10/2018 1231  AST 17 08/10/2018 1231   ALT 18 08/10/2018 1231   ALKPHOS 115 08/10/2018 1231   BILITOT 0.3 08/10/2018 1231      Component Value Date/Time   TSH 1.990 08/10/2018 1231   Results for COURNEY, GARROD (MRN [id number]) as of 10/04/2018 15:24  Ref. Range 08/10/2018 12:31  Vitamin D, 25-Hydroxy Latest Ref Range: 30.0 - 100.0 ng/mL 33.9   ASSESSMENT AND PLAN: Vitamin D deficiency - Plan: Vitamin D, Ergocalciferol, (DRISDOL) 1.25 MG (50000 UT) CAPS capsule  At risk for osteoporosis  Class 2 severe obesity with serious comorbidity and body mass index (BMI) of 35.0 to 35.9 in adult, unspecified obesity type ([place])  PLAN:  Vitamin D Deficiency Dana Williams was informed that low vitamin D levels contributes to fatigue and are associated with obesity, breast, and colon cancer. She agrees to continue to take prescription Vit D @50 ,000 IU every week #4 with no refills and will follow up for routine testing of vitamin D, at least 2-3 times per year. She was informed of the risk of over-replacement of vitamin D and agrees to not increase her dose unless she discusses this with [name] first. Dana Williams agrees to follow up in 4 weeks.  At risk for osteopenia and osteoporosis Dana Williams was given extended (15 minutes) osteoporosis prevention counseling today. Dana Williams is at risk for osteopenia and osteoporosis due to her vitamin D deficiency. She was  encouraged to take her vitamin D and follow her higher calcium diet and increase strengthening exercise to help strengthen her bones and decrease her risk of osteopenia and osteoporosis.  Obesity Dana Williams is currently in the action stage of change. As such, her goal is to continue with weight loss efforts. She has agreed to follow the Category 3 plan. Dana Williams has been instructed to work up to a goal of 150 minutes of combined cardio and strengthening exercise per week for weight loss and overall health benefits. We discussed the following Behavioral Modification Strategies today: increasing lean protein intake, decreasing simple carbohydrates, and holiday eating strategies.   Dana Williams has agreed to follow up with our clinic in 2 weeks with our registered dietitian and in 4 weeks for an office visit. She was informed of the importance of frequent follow up visits to maximize her success with intensive lifestyle modifications for her multiple health conditions.   OBESITY BEHAVIORAL INTERVENTION VISIT  Today's visit was # 4   Starting weight: 236 lbs Starting date: 08/10/18 Today's weight : Weight: 215 lb (97.5 kg)  Today's date: 10/03/2018 Total lbs lost to date: 21  ASK: We discussed the diagnosis of obesity with Dana Williams today and Dana Williams agreed to give [name] permission to discuss obesity behavioral modification therapy today.  ASSESS: Dana Williams has the diagnosis of obesity and her BMI today is 35.78. Dana Williams is in the action stage of change.  ADVISE: Dana Williams was educated on the multiple health risks of obesity as well as the benefit of weight loss to improve her health. She was advised of the need for long term treatment and the importance of lifestyle modifications to improve her current health and to decrease her risk of future health problems.  AGREE: Multiple dietary modification options and treatment options were discussed and Dana Williams agreed to follow the recommendations documented in the above  note.  ARRANGE: Dana Williams was educated on the importance of frequent visits to treat obesity as outlined per CMS and USPSTF guidelines and agreed to schedule her next follow up appointment today.  IMarcille Blanco, am acting  as transcriptionist for Starlyn Skeans, MD  I have reviewed the above documentation for accuracy and completeness, and I agree with the above. -Dennard Nip, MD

## 2018-10-07 MED FILL — ESOMEPRAZOLE MAG DR 40 MG C: 40 | 90 days supply | Qty: 90 | Fill #1

## 2018-10-07 MED FILL — ROSUVASTATIN CALCIUM 10 MG: 10 | 90 days supply | Qty: 90 | Fill #1

## 2018-10-07 MED FILL — ESTRADIOL-NORETHINDRONE ACE: 1-0.5 | 84 days supply | Qty: 84 | Fill #1

## 2018-10-17 ENCOUNTER — Ambulatory Visit (INDEPENDENT_AMBULATORY_CARE_PROVIDER_SITE_OTHER): Payer: 59 | Admitting: Dietician

## 2018-10-17 VITALS — Ht 65.0 in | Wt 217.0 lb

## 2018-10-17 DIAGNOSIS — Z9189 Other specified personal risk factors, not elsewhere classified: Secondary | ICD-10-CM

## 2018-10-17 DIAGNOSIS — E8881 Metabolic syndrome: Secondary | ICD-10-CM

## 2018-10-17 DIAGNOSIS — Z6835 Body mass index (BMI) 35.0-35.9, adult: Secondary | ICD-10-CM

## 2018-10-18 NOTE — Progress Notes (Signed)
  Office: [phone]  /  Fax: [phone]     Dana Williams has a diagnosis of insulin resistance based on her elevated fasting insulin level >5. Although Dana Williams's blood glucose readings are still under good control, insulin resistance puts her at greater risk of metabolic syndrome and diabetes. She is here today for diabetes risk nutrition counseling which includes her obesity treatment plan.   Dana Williams's weight today is 217 lbs, a 2 lb weight gain since her last visit, however she appears to retaining a little fluid today. She has had a weight loss of 19 lbs since beginning treatment with [name].   She reports she did indulge some over the thanksgiving holiday however she states she has been able to get back to her meal plan and follow approximately 80% since Thanksgiving. She denies any significant polyphagia when on her meal plan. She states with her busy work schedule she would like some easy dinner meal ideas. She agreed to journal her dinner meal food intake with goal of 400-600 calories and 40+ g protein using the My Fitness Pal app. We discussed dinner meal ideas and recipes at length.   Also reviewed food nutrients ie protein, fats, simple and complex carbohydrates and how these affect insulin response and her weight. Focus on portion control,  avoiding simple carbohydrates and increasing lean proteins for ongoing wt loss efforts and glucose management.  Dana Williams is on the following meal plan Category 3 for breakfast and lunch and journal her dinner meal 400-600 calories and 40+ g protein. Her meal plan was individualized for maximum benefit.  Also discussed at length the following behavioral modifications to help maximize  Success:  increasing lean protein intake, increasing vegetables, meal planning and cooking strategies, holiday eating strategies, celebration eating strategies, keeping a food journal.     Dana Williams has been instructed to work up to a goal of 150 minutes of combined cardio and  strengthening exercise per week for weight loss and overall health benefits. Written information was provided and the following handouts were given: journaling instructions, recipes and meal ideas.    OBESITY BEHAVIORAL INTERVENTION VISIT  Today's visit was # 5   Starting weight: 236 lbs Starting date: 08/10/18 Today's weight : Weight: 217 lb (98.4 kg)  Today's date: 10/17/18 Total lbs lost to date: 19 lbs At least 15 minutes were spent on discussing the following behavioral intervention visit.   ASK: We discussed the diagnosis of obesity with Dana Williams today and Dana Williams agreed to give [name] permission to discuss obesity behavioral modification therapy today.  ASSESS: Dana Williams has the diagnosis of obesity and her BMI today is 60 Dana Williams is in the action stage of change   ADVISE: Dana Williams was educated on the multiple health risks of obesity as well as the benefit of weight loss to improve her health. She was advised of the need for long term treatment and the importance of lifestyle modifications to improve her current health and to decrease her risk of future health problems.  AGREE: Multiple dietary modification options and treatment options were discussed and  Dana Williams agreed to follow the recommendations documented in the above note.  ARRANGE: Dana Williams was educated on the importance of frequent visits to treat obesity as outlined per CMS and USPSTF guidelines and agreed to schedule her next follow up appointment today.

## 2018-10-24 MED FILL — ALPRAZolam 0.25 MG TABS: 0.25 | 30 days supply | Qty: 30 | Fill #2

## 2018-10-25 ENCOUNTER — Ambulatory Visit (INDEPENDENT_AMBULATORY_CARE_PROVIDER_SITE_OTHER): Payer: 59 | Admitting: Psychology

## 2018-10-25 ENCOUNTER — Ambulatory Visit (INDEPENDENT_AMBULATORY_CARE_PROVIDER_SITE_OTHER): Payer: 59 | Admitting: Family Medicine

## 2018-10-25 ENCOUNTER — Encounter (INDEPENDENT_AMBULATORY_CARE_PROVIDER_SITE_OTHER): Payer: Self-pay | Admitting: Family Medicine

## 2018-10-25 VITALS — BP 126/75 | HR 76 | Temp 97.3°F | Ht 65.0 in | Wt 215.0 lb

## 2018-10-25 DIAGNOSIS — Z9189 Other specified personal risk factors, not elsewhere classified: Secondary | ICD-10-CM | POA: Diagnosis not present

## 2018-10-25 DIAGNOSIS — F330 Major depressive disorder, recurrent, mild: Secondary | ICD-10-CM | POA: Diagnosis not present

## 2018-10-25 DIAGNOSIS — Z85828 Personal history of other malignant neoplasm of skin: Secondary | ICD-10-CM | POA: Diagnosis not present

## 2018-10-25 DIAGNOSIS — E559 Vitamin D deficiency, unspecified: Secondary | ICD-10-CM | POA: Diagnosis not present

## 2018-10-25 DIAGNOSIS — Z6835 Body mass index (BMI) 35.0-35.9, adult: Secondary | ICD-10-CM

## 2018-10-25 DIAGNOSIS — M24851 Other specific joint derangements of right hip, not elsewhere classified: Secondary | ICD-10-CM | POA: Diagnosis not present

## 2018-10-25 MED ORDER — VITAMIN D (ERGOCALCIFEROL) 1.25 MG (50000 UNIT) PO CAPS: 50000.0000 [IU] | ORAL_CAPSULE | ORAL | 0 refills | Status: DC

## 2018-10-25 MED FILL — CELECOXIB 200 MG CAP: 200 | 30 days supply | Qty: 60 | Fill #0

## 2018-10-25 NOTE — Progress Notes (Signed)
Office: [phone]  /  Fax: [phone]   Date: October 25, 2018 Time Seen: 8:00am Duration: 30 minutes Provider: Glennie Isle, Psy.D. Type of Session: Individual Therapy   HPI: Karolwas referred by Dr. Lillia Carmel to positive depression screen.She was seen for an initial appointment by this provider on August 24, 2018.Per the note for the initial visit withDr. Desmond Dike August 10, 2018,"Leilanie has a positive depression screen and she is not on antidepressants. Her mood is depressed, she feels isolated and feels she has no social life.Mckinsey is working long hours and she knows, she isn't taking care of her health like she should."In addition, per the note for the initial visit withDr. Desmond Dike August 10, 2018,Karolhas been heavy most of her life and started gaining weight after menopause. Her heaviest weight ever was 230 pounds. During her initial appointment with Dr. Trinda Pascal reported experiencing the following:significant food cravings issues; snacking frequently in the evening; frequently drinking liquids with calories; binge eating behavior; and struggling with emotional eating.Duringthe initial appointment with this provider, Gael explained she gained "signinfant weight" recently. She noted she has tried diets in the past, but "nothing seemed to work." Brianda described herself as a "boredom and emotional eater." She discussed her current work schedule make it difficult to make healthier eating choices. Regarding the current meal plan, Kevonna noted she did not obtain the foods for the meal plan until a few days after her appointment with Dr. [name]. She indicated that while she has not completely followed the meal plan, she has done "better." Per her scale at home, Erum lost ten pounds. In addition, Darlyne described craving candy. Ozella denied a history of binge eating. She denied a history of purging and engagement in other compensatory strategies.  Tanessa has never been diagnosed with an eating disorder.Furthermore,Karolwas asked to complete a questionnaire assessing various behaviors related to emotional eating. Karolendorsed the following: experience food cravings on a regular basis, eat certain foods when you are anxious, stressed, depressed, or your feelings are hurt, use food to help you cope with emotional situations, find food is comforting to you and overeat frequently when you are bored or lonely.  Session Content: Session focused on the following treatment goals: decrease emotional eating and increase coping skills as well as termination. The session was initiated with the administration of the PHQ-9 and GAD-7, as well as a brief check-in. Tahani reported she gained two pounds after Thanksgiving, but discussed having awareness of what resulted in the weight gain. She shared she met with the  dietitian recently and noted, "it went well." Donnarae shared about upcoming holiday parties; therefore, this provider further discussed portion control and the importance of consuming protein prior to events. Susanna noted she ate protein before a recent party to ensure she was consuming the recommended amount and she also noted that she used behavioral strategies that were previously discussed. Since the onset of treatment with this provider, Deborah reported an increase in socialization and a decrease in emotional eating. Regarding mindfulness, Mahina reported engaging in it 5-10 minutes daily. Today's appointment then focused on a mindfulness exercise, "A Taste of Mindfulness." Following the exercise, Malay reported, "I like that." She was also provided with a handout with the hunger and satisfaction scale. At this time, Jaleeya requested to terminate therapeutic services with this provider and noted, "I am good." Katelen was receptive to today's session as evidenced by openness to sharing, responsiveness to feedback, and engagement in the mindfulness exercise.    Mental Status Examination:  Courtnie arrived on time for the appointment. She presented as appropriately dressed and groomed. Oscar appeared her stated age and demonstrated adequate orientation to time, place, person, and purpose of the appointment. She also demonstrated appropriate eye contact. No psychomotor abnormalities or behavioral peculiarities noted. Her mood was euthymic with congruent affect. Her thought processes were logical, linear, and goal-directed. No hallucinations, delusions, bizarre thinking or behavior reported or observed. Judgment, insight, and impulse control appeared to be grossly intact. There was no evidence of paraphasias (i.e., errors in speech, gross mispronunciations, and word substitutions), repetition deficits, or disturbances in volume or prosody (i.e., rhythm and intonation). There was no evidence of attention or memory impairments. Ceara denied current suicidal and homicidal ideation, intent or plan.  Structured Assessment Results: The Patient Health Questionnaire-9 (PHQ-9) is a self-report measure that assesses symptoms and severity of depression over the course of the last two weeks. Beverely obtained a score of zero. Depression screen [facility] 2/9 10/25/2018  Decreased Interest 0  Down, Depressed, Hopeless 0  PHQ - 2 Score 0  Altered sleeping 0  Tired, decreased energy 0  Change in appetite 0  Feeling bad or failure about yourself  0  Trouble concentrating 0  Moving slowly or fidgety/restless 0  Suicidal thoughts 0  PHQ-9 Score 0  Difficult doing work/chores -   The Generalized Anxiety Disorder-7 (GAD-7) is a brief self-report measure that assesses symptoms of anxiety over the course of the last two weeks. Nazaret obtained a score of zero. GAD 7 : Generalized Anxiety Score 10/25/2018  Nervous, Anxious, on Edge 0  Control/stop worrying 0  Worry too much - different things 0  Trouble relaxing 0  Restless 0  Easily annoyed or irritable 0  Afraid - awful might happen  0  Total GAD 7 Score 0  Anxiety Difficulty -   Interventions: Katlynne was administered the PHQ-9 and GAD-7 for symptom monitoring. Content from the last session was reviewed. Throughout today's session, empathic reflections and validation were provided. The importance of portion control and protein intake was further discussed. Mindfulness was also further discussed and Diamante was led through an exercise.  DSM-5 Diagnosis: 296.31 (F33.0) Major Depressive Disorder, Recurrent Episode, Mild, With Anxious Distress, Mild  Treatment Goal & Progress: Karoline was seen for an initial appointment with this provider on August 24, 2018 during which the following treatment goals were established: decrease emotional eating and increase coping skills. Alexx has demonstrated progress in her goal of decreasing emotional eating as evidenced by her increased awareness of hunger patterns and triggers for emotional eating. She also noted an overall decrease in episodes of emotional eating. Regarding her progress with the goal of increasing coping skills, Palin reported engaging in pleasurable activities in the past and continues to engage in mindfulness. She also noted an overall increase in socialization.  Plan: Denesia requested to terminate services as this time and noted, "I'm good." This provider discussed options should Audrie desire to re-initiate therapeutic services. She verbally acknowledged understanding.

## 2018-10-25 NOTE — Progress Notes (Signed)
Office: [phone]  /  Fax: [phone]   HPI:   Chief Complaint: OBESITY Dana Williams is here to discuss her progress with her obesity treatment plan. She is on the Category 3 plan and is following her eating plan approximately 80 % of the time. She states she is exercising 0 minutes 0 times per week. [name] has done well maintaining weight over Thanksgiving. Her hunger is controlled and she is doing well with meal planning and prepping. She notes a decrease in emotional eating and feels good overall.  Her weight is 215 lb (97.5 kg) today and has not lost weight since her last visit. She has lost 21 lbs since starting treatment with [name].  Vitamin D deficiency [name] has a diagnosis of vitamin D deficiency. She is currently taking vit D and is stable. She denies nausea, vomiting, or muscle weakness.  At risk for osteopenia and osteoporosis [name] is at higher risk of osteopenia and osteoporosis due to vitamin D deficiency.   ALLERGIES: Allergies  Allergen Reactions  . Sulfa Antibiotics Other (See Comments)    Mouth ulcers    MEDICATIONS: Current Outpatient Medications on File Prior to Visit  Medication Sig Dispense Refill  . ammonium lactate (LAC-HYDRIN) 12 % lotion Apply 1 application topically daily.     . Azelaic Acid (FINACEA) 15 % cream Apply 1 application topically daily.     . calcipotriene-betamethasone (TACLONEX) external suspension Apply 1 application topically daily as needed.     [name] esomeprazole (NEXIUM) 40 MG capsule Take 40 mg by mouth daily before breakfast.    . Multiple Vitamins-Minerals (MULTIVITAMIN/EXTRA VITAMIN D3) CHEW Chew 2 tablets by mouth daily.    . norethindrone-ethinyl estradiol (FEMHRT 1/5) 1-5 MG-MCG TABS tablet Take 1 tablet by mouth daily.   12  . rosuvastatin (CRESTOR) 10 MG tablet Take 10 mg by mouth daily.     No current facility-administered medications on file prior to visit.     PAST MEDICAL HISTORY: Past Medical History:  Diagnosis Date  .  Arthritis   . Arthritis   . Arthritis of pelvic region   . Depression   . Dry skin   . Fatigue   . GERD (gastroesophageal reflux disease)   . H/O psoriatic arthritis    DX  age 59---- in remission for 20 years  . History of hiatal hernia   . History of kidney stones   . Hyperlipidemia   . Joint pain   . Kidney stones   . Obesity   . Personal history of colonic polyp - adenoma 01/29/2014  . Plantar fasciitis of left foot   . PONV (postoperative nausea and vomiting)   . Postmenopausal   . Psoriasis   . Renal calculus, left   . Sigmoid diverticulosis   . Vitamin D deficiency     PAST SURGICAL HISTORY: Past Surgical History:  Procedure Laterality Date  . CLOSED MANIPULATION SHOULDER    . COLONOSCOPY WITH PROPOFOL  01-25-2014  . CYSTO/  BILATERAL RETROGRADE PYELOGRAM/  BILATERAL URETEROSCOPIC LASER LITHOTRIPSY STONE EXTRACTIONS/  BILATERAL STENT PLACEMENT  02-13-2010  . CYSTOSCOPY WITH RETROGRADE PYELOGRAM, URETEROSCOPY AND STENT PLACEMENT Left 04/21/2015   Procedure: CYSTOSCOPY WITH RETROGRADE PYELOGRAM, URETEROSCOPY, STONE EXTRACTION AND STENT PLACEMENT;  Surgeon: Franchot Gallo, MD;  Location: [facility];  Service: Urology;  Laterality: Left;  . CYSTOURETHROSCOPY/  PLACEMENT LYNX SUPRAPUBIC SLING  05-24-2008  . HAND TENDON SURGERY Right 1993  . HOLMIUM LASER APPLICATION Left 06/16/5052   Procedure: HOLMIUM LASER APPLICATION;  Surgeon:  Franchot Gallo, MD;  Location: [facility];  Service: Urology;  Laterality: Left;  . LAPAROSCOPIC CHOLECYSTECTOMY  01-31-2002  . LEFT URETEROSCOPIC STONE EXTRACTION/  STENT PLACEMENT  10-06-2005  . RHINOPLASTY  2007  . STRABISMUS SURGERY Left age 22  . TOTAL KNEE ARTHROPLASTY Left 10/27/2016   Procedure: TOTAL KNEE ARTHROPLASTY;  Surgeon: [name], MD;  Location: [place];  Service: Orthopedics;  Laterality: Left;  . WISDOM TOOTH EXTRACTION  age 68    SOCIAL HISTORY: Social History   Tobacco Use  .  Smoking status: Never Smoker  . Smokeless tobacco: Never Used  Substance Use Topics  . Alcohol use: No  . Drug use: No    FAMILY HISTORY: Family History  Problem Relation Age of Onset  . Stomach cancer Paternal Grandmother   . Hypertension Mother   . Hyperlipidemia Mother   . Thyroid disease Mother   . Obesity Mother   . Colon cancer Neg Hx   . Esophageal cancer Neg Hx   . Rectal cancer Neg Hx     ROS: Review of Systems  Constitutional: Negative for weight loss.  Gastrointestinal: Negative for nausea and vomiting.  Musculoskeletal:       Negative for muscle weakness.    PHYSICAL EXAM: Blood pressure 126/75, pulse 76, temperature (!) 97.3 F (36.3 C), temperature source Oral, height 5\' 5"  (1.651 m), weight 215 lb (97.5 kg), SpO2 99 %. Body mass index is 35.78 kg/m. Physical Exam  Constitutional: She is oriented to person, place, and time. She appears well-developed and well-nourished.  Cardiovascular: Normal rate.  Pulmonary/Chest: Effort normal.  Musculoskeletal: Normal range of motion.  Neurological: She is oriented to person, place, and time.  Skin: Skin is warm and dry.  Psychiatric: She has a normal mood and affect. Her behavior is normal.  Vitals reviewed.   RECENT LABS AND TESTS: BMET    Component Value Date/Time   NA 143 08/10/2018 1231   K 4.1 08/10/2018 1231   CL 103 08/10/2018 1231   CO2 23 08/10/2018 1231   GLUCOSE 87 08/10/2018 1231   GLUCOSE 129 (H) 10/28/2016 0530   BUN 10 08/10/2018 1231   CREATININE 0.73 08/10/2018 1231   CALCIUM 9.3 08/10/2018 1231   GFRNONAA 91 08/10/2018 1231   GFRAA 105 08/10/2018 1231   Lab Results  Component Value Date   HGBA1C 5.4 08/10/2018   Lab Results  Component Value Date   INSULIN 10.9 08/10/2018   CBC    Component Value Date/Time   WBC 4.7 08/10/2018 1231   WBC 7.2 10/28/2016 0530   RBC 4.03 08/10/2018 1231   RBC 3.86 (L) 10/28/2016 0530   HGB 11.0 (L) 08/10/2018 1231   HCT 35.6 08/10/2018 1231     PLT 213 10/28/2016 0530   MCV 88 08/10/2018 1231   MCH 27.3 08/10/2018 1231   MCH 29.5 10/28/2016 0530   MCHC 30.9 (L) 08/10/2018 1231   MCHC 32.3 10/28/2016 0530   RDW 13.8 08/10/2018 1231   LYMPHSABS 1.7 08/10/2018 1231   EOSABS 0.1 08/10/2018 1231   BASOSABS 0.0 08/10/2018 1231   Iron/TIBC/Ferritin/ %Sat No results found for: IRON, TIBC, FERRITIN, IRONPCTSAT Lipid Panel     Component Value Date/Time   CHOL 182 08/10/2018 1231   TRIG 116 08/10/2018 1231   HDL 89 08/10/2018 1231   LDLCALC 70 08/10/2018 1231   Hepatic Function Panel     Component Value Date/Time   PROT 6.5 08/10/2018 1231   ALBUMIN 4.0 08/10/2018 1231  AST 17 08/10/2018 1231   ALT 18 08/10/2018 1231   ALKPHOS 115 08/10/2018 1231   BILITOT 0.3 08/10/2018 1231      Component Value Date/Time   TSH 1.990 08/10/2018 1231   Results for JAYAH, BALTHAZAR (MRN [id number]) as of 10/25/2018 12:17  Ref. Range 08/10/2018 12:31  Vitamin D, 25-Hydroxy Latest Ref Range: 30.0 - 100.0 ng/mL 33.9   ASSESSMENT AND PLAN: Vitamin D deficiency - Plan: Vitamin D, Ergocalciferol, (DRISDOL) 1.25 MG (50000 UT) CAPS capsule  At risk for osteoporosis  Class 2 severe obesity with serious comorbidity and body mass index (BMI) of 35.0 to 35.9 in adult, unspecified obesity type ([place])  PLAN:  Vitamin D Deficiency Barbar was informed that low vitamin D levels contributes to fatigue and are associated with obesity, breast, and colon cancer. She agrees to continue to take prescription Vit D @50 ,000 IU every week #4 with no refills and will follow up for routine testing of vitamin D, at least 2-3 times per year. She was informed of the risk of over-replacement of vitamin D and agrees to not increase her dose unless she discusses this with [name] first. Nomi agrees to follow up in 3 to 4 weeks.  At risk for osteopenia and osteoporosis Arial was given extended (15 minutes) osteoporosis prevention counseling today. Aeron is at risk for  osteopenia and osteoporosis due to her vitamin D deficiency. She was encouraged to take her vitamin D and follow her higher calcium diet and increase strengthening exercise to help strengthen her bones and decrease her risk of osteopenia and osteoporosis.  Obesity Kawena is currently in the action stage of change. As such, her goal is to continue with weight loss efforts. She has agreed to follow the Category 3 plan and to keep a food journal with 400 to 600 calories and 40 grams of protein for supper. Rayme has been instructed to work up to a goal of 150 minutes of combined cardio and strengthening exercise per week for weight loss and overall health benefits. We discussed the following Behavioral Modification Strategies today: increasing lean protein intake, decreasing simple carbohydrates, work on meal planning and easy cooking plans, holiday eating strategies, and celebration eating strategies.  Ellarose has agreed to follow up with our clinic in 3 to 4 weeks. She was informed of the importance of frequent follow up visits to maximize her success with intensive lifestyle modifications for her multiple health conditions.   OBESITY BEHAVIORAL INTERVENTION VISIT  Today's visit was # 5   Starting weight: 236 lbs Starting date: 08/10/18 Today's weight : Weight: 215 lb (97.5 kg)  Today's date: 10/25/2018 Total lbs lost to date: 21  ASK: We discussed the diagnosis of obesity with Berneta Sages today and Ileene Hutchinson agreed to give [name] permission to discuss obesity behavioral modification therapy today.  ASSESS: Cornella has the diagnosis of obesity and her BMI today is 35.7. Bethann is in the action stage of change.   ADVISE: Kaira was educated on the multiple health risks of obesity as well as the benefit of weight loss to improve her health. She was advised of the need for long term treatment and the importance of lifestyle modifications to improve her current health and to decrease her risk of future  health problems.  AGREE: Multiple dietary modification options and treatment options were discussed and Isha agreed to follow the recommendations documented in the above note.  ARRANGE: Deeana was educated on the importance of frequent visits to treat obesity as outlined  per CMS and USPSTF guidelines and agreed to schedule her next follow up appointment today.  I, Marcille Blanco, am acting as transcriptionist for Starlyn Skeans, MD  I have reviewed the above documentation for accuracy and completeness, and I agree with the above. -Dennard Nip, MD

## 2018-11-03 MED FILL — AMMONIUM LACTATE 12% LOTION: 12 | 60 days supply | Qty: 800 | Fill #1

## 2018-11-03 MED FILL — VIT D2 1.25 MG (50,000 UNIT: 1.25 MG | 28 days supply | Qty: 4 | Fill #0

## 2018-11-22 MED FILL — DOXYCYCLINE HYCLATE 100 MG: 100 | 15 days supply | Qty: 30 | Fill #0

## 2018-11-23 ENCOUNTER — Ambulatory Visit (INDEPENDENT_AMBULATORY_CARE_PROVIDER_SITE_OTHER): Payer: 59 | Admitting: Family Medicine

## 2018-11-23 ENCOUNTER — Encounter (INDEPENDENT_AMBULATORY_CARE_PROVIDER_SITE_OTHER): Payer: Self-pay | Admitting: Family Medicine

## 2018-11-23 VITALS — BP 115/73 | HR 111 | Temp 97.6°F | Ht 65.0 in | Wt 219.0 lb

## 2018-11-23 DIAGNOSIS — R7303 Prediabetes: Secondary | ICD-10-CM | POA: Diagnosis not present

## 2018-11-23 DIAGNOSIS — Z9189 Other specified personal risk factors, not elsewhere classified: Secondary | ICD-10-CM

## 2018-11-23 DIAGNOSIS — Z6836 Body mass index (BMI) 36.0-36.9, adult: Secondary | ICD-10-CM | POA: Diagnosis not present

## 2018-11-23 DIAGNOSIS — E559 Vitamin D deficiency, unspecified: Secondary | ICD-10-CM

## 2018-11-23 MED ORDER — VITAMIN D (ERGOCALCIFEROL) 1.25 MG (50000 UNIT) PO CAPS: 50000.0000 [IU] | ORAL_CAPSULE | ORAL | 0 refills | Status: DC

## 2018-11-23 MED ORDER — METFORMIN HCL 500 MG PO TABS: 500.0000 mg | ORAL_TABLET | Freq: Every day | ORAL | 0 refills | Status: DC

## 2018-11-23 MED FILL — metFORMIN HCL 500 MG TABS: 500 | 30 days supply | Qty: 30 | Fill #0

## 2018-11-27 NOTE — Progress Notes (Signed)
Office: [phone]  /  Fax: [phone]   HPI:   Chief Complaint: OBESITY Dana Williams is here to discuss her progress with her obesity treatment plan. She is on the Category 3 plan and is following her eating plan approximately 50 % of the time. She states she is exercising 0 minutes 0 times per week. Dana Williams got off track over the holidays. She notes increased snacking and is feeling frustrated with herself. She states that she is ready to get back on track.  Her weight is 219 lb (99.3 kg) today and has had a weight gain of 4 pounds over a period of 7 weeks since her last visit. She has lost 17 lbs since starting treatment with [name].  Pre-Diabetes Dana Williams has a diagnosis of pre-diabetes based on her elevated Hgb A1c and was informed this puts her at greater risk of developing diabetes. She is not taking metformin currently and notes increased polyphagia, worsening with simple carbs. She denies hypoglycemia.  At risk for diabetes Dana Williams is at higher than average risk for developing diabetes due to her pre-diabetes and obesity. She currently denies polyuria or polydipsia.  Vitamin D deficiency Dana Williams has a diagnosis of vitamin D deficiency. She is currently stable on vit D, but not yet at goal. She denies nausea, vomiting, or muscle weakness.  ASSESSMENT AND PLAN:  Prediabetes - Plan: metFORMIN (GLUCOPHAGE) 500 MG tablet  Vitamin D deficiency - Plan: Vitamin D, Ergocalciferol, (DRISDOL) 1.25 MG (50000 UT) CAPS capsule  At risk for diabetes mellitus  Class 2 severe obesity with serious comorbidity and body mass index (BMI) of 36.0 to 36.9 in adult, unspecified obesity type [facility])  PLAN:  Pre-Diabetes Dana Williams will continue to work on weight loss, exercise, and decreasing simple carbohydrates in her diet to help decrease the risk of diabetes. We discussed metformin including benefits and risks. She was informed that eating too many simple carbohydrates or too many calories at one sitting increases  the likelihood of GI side effects. [name] agreed to start metformin 500mg  qAM #30 with no refills and a prescription was written today and to get back to her diet. Dana Williams agreed to follow up with [name] as directed to monitor her progress in 2 to 3 weeks.  Diabetes risk counseling Dana Williams was given extended (15 minutes) diabetes prevention counseling today. She is 58 y.o. female and has risk factors for diabetes including pre-diabetes and obesity. We discussed intensive lifestyle modifications today with an emphasis on weight loss as well as increasing exercise and decreasing simple carbohydrates in her diet.  Vitamin D Deficiency Dana Williams was informed that low vitamin D levels contributes to fatigue and are associated with obesity, breast, and colon cancer. She agrees to continue to take prescription Vit D @50 ,000 IU every week #4 with no refills and will follow up for routine testing of vitamin D, at least 2-3 times per year. She was informed of the risk of over-replacement of vitamin D and agrees to not increase her dose unless she discusses this with [name] first. Dana Williams agrees to follow up as directed.  Obesity Dana Williams is currently in the action stage of change. As such, her goal is to continue with weight loss efforts. She has agreed to follow the Category 3 plan. Dana Williams has been instructed to work up to a goal of 150 minutes of combined cardio and strengthening exercise per week for weight loss and overall health benefits. We discussed the following Behavioral Modification Strategies today: increasing lean protein intake, decreasing simple carbohydrates, decrease  eating out, and work on meal planning and easy cooking plans.  Dana Williams has agreed to follow up with our clinic in 2 to 3 weeks for a fasting appointment. She was informed of the importance of frequent follow up visits to maximize her success with intensive lifestyle modifications for her multiple health conditions.  ALLERGIES: Allergies  Allergen  Reactions  . Sulfa Antibiotics Other (See Comments)    Mouth ulcers    MEDICATIONS: Current Outpatient Medications on File Prior to Visit  Medication Sig Dispense Refill  . ammonium lactate (LAC-HYDRIN) 12 % lotion Apply 1 application topically daily.     . Azelaic Acid (FINACEA) 15 % cream Apply 1 application topically daily.     . calcipotriene-betamethasone (TACLONEX) external suspension Apply 1 application topically daily as needed.     [name] esomeprazole (NEXIUM) 40 MG capsule Take 40 mg by mouth daily before breakfast.    . Multiple Vitamins-Minerals (MULTIVITAMIN/EXTRA VITAMIN D3) CHEW Chew 2 tablets by mouth daily.    . norethindrone-ethinyl estradiol (FEMHRT 1/5) 1-5 MG-MCG TABS tablet Take 1 tablet by mouth daily.   12  . rosuvastatin (CRESTOR) 10 MG tablet Take 10 mg by mouth daily.     No current facility-administered medications on file prior to visit.     PAST MEDICAL HISTORY: Past Medical History:  Diagnosis Date  . Arthritis   . Arthritis   . Arthritis of pelvic region   . Depression   . Dry skin   . Fatigue   . GERD (gastroesophageal reflux disease)   . H/O psoriatic arthritis    DX  age 84---- in remission for 20 years  . History of hiatal hernia   . History of kidney stones   . Hyperlipidemia   . Joint pain   . Kidney stones   . Obesity   . Personal history of colonic polyp - adenoma 01/29/2014  . Plantar fasciitis of left foot   . PONV (postoperative nausea and vomiting)   . Postmenopausal   . Psoriasis   . Renal calculus, left   . Sigmoid diverticulosis   . Vitamin D deficiency     PAST SURGICAL HISTORY: Past Surgical History:  Procedure Laterality Date  . CLOSED MANIPULATION SHOULDER    . COLONOSCOPY WITH PROPOFOL  01-25-2014  . CYSTO/  BILATERAL RETROGRADE PYELOGRAM/  BILATERAL URETEROSCOPIC LASER LITHOTRIPSY STONE EXTRACTIONS/  BILATERAL STENT PLACEMENT  02-13-2010  . CYSTOSCOPY WITH RETROGRADE PYELOGRAM, URETEROSCOPY AND STENT PLACEMENT Left  04/21/2015   Procedure: CYSTOSCOPY WITH RETROGRADE PYELOGRAM, URETEROSCOPY, STONE EXTRACTION AND STENT PLACEMENT;  Surgeon: Franchot Gallo, MD;  Location: [facility];  Service: Urology;  Laterality: Left;  . CYSTOURETHROSCOPY/  PLACEMENT LYNX SUPRAPUBIC SLING  05-24-2008  . HAND TENDON SURGERY Right 1993  . HOLMIUM LASER APPLICATION Left 0/12/5850   Procedure: HOLMIUM LASER APPLICATION;  Surgeon: Franchot Gallo, MD;  Location: [facility];  Service: Urology;  Laterality: Left;  . LAPAROSCOPIC CHOLECYSTECTOMY  01-31-2002  . LEFT URETEROSCOPIC STONE EXTRACTION/  STENT PLACEMENT  10-06-2005  . RHINOPLASTY  2007  . STRABISMUS SURGERY Left age 54  . TOTAL KNEE ARTHROPLASTY Left 10/27/2016   Procedure: TOTAL KNEE ARTHROPLASTY;  Surgeon: [name], MD;  Location: [place];  Service: Orthopedics;  Laterality: Left;  . WISDOM TOOTH EXTRACTION  age 51    SOCIAL HISTORY: Social History   Tobacco Use  . Smoking status: Never Smoker  . Smokeless tobacco: Never Used  Substance Use Topics  . Alcohol use: No  .  Drug use: No    FAMILY HISTORY: Family History  Problem Relation Age of Onset  . Stomach cancer Paternal Grandmother   . Hypertension Mother   . Hyperlipidemia Mother   . Thyroid disease Mother   . Obesity Mother   . Colon cancer Neg Hx   . Esophageal cancer Neg Hx   . Rectal cancer Neg Hx     ROS: Review of Systems  Constitutional: Negative for malaise/fatigue.  Gastrointestinal: Negative for nausea and vomiting.  Genitourinary:       Negative for polyuria.  Musculoskeletal:       Negative for muscle weakness.  Endo/Heme/Allergies: Negative for polydipsia.       Negative for hypoglycemia.    PHYSICAL EXAM: Blood pressure 115/73, pulse (!) 111, temperature 97.6 F (36.4 C), temperature source Oral, height 5\' 5"  (1.651 m), weight 219 lb (99.3 kg), SpO2 96 %. Body mass index is 36.44 kg/m. Physical Exam Vitals signs reviewed.    Constitutional:      Appearance: Normal appearance. She is obese.  Cardiovascular:     Rate and Rhythm: Normal rate.  Pulmonary:     Effort: Pulmonary effort is normal.  Musculoskeletal: Normal range of motion.  Skin:    General: Skin is warm and dry.  Neurological:     Mental Status: She is alert and oriented to person, place, and time.  Psychiatric:        Mood and Affect: Mood normal.        Behavior: Behavior normal.     RECENT LABS AND TESTS: BMET    Component Value Date/Time   NA 143 08/10/2018 1231   K 4.1 08/10/2018 1231   CL 103 08/10/2018 1231   CO2 23 08/10/2018 1231   GLUCOSE 87 08/10/2018 1231   GLUCOSE 129 (H) 10/28/2016 0530   BUN 10 08/10/2018 1231   CREATININE 0.73 08/10/2018 1231   CALCIUM 9.3 08/10/2018 1231   GFRNONAA 91 08/10/2018 1231   GFRAA 105 08/10/2018 1231   Lab Results  Component Value Date   HGBA1C 5.4 08/10/2018   Lab Results  Component Value Date   INSULIN 10.9 08/10/2018   CBC    Component Value Date/Time   WBC 4.7 08/10/2018 1231   WBC 7.2 10/28/2016 0530   RBC 4.03 08/10/2018 1231   RBC 3.86 (L) 10/28/2016 0530   HGB 11.0 (L) 08/10/2018 1231   HCT 35.6 08/10/2018 1231   PLT 213 10/28/2016 0530   MCV 88 08/10/2018 1231   MCH 27.3 08/10/2018 1231   MCH 29.5 10/28/2016 0530   MCHC 30.9 (L) 08/10/2018 1231   MCHC 32.3 10/28/2016 0530   RDW 13.8 08/10/2018 1231   LYMPHSABS 1.7 08/10/2018 1231   EOSABS 0.1 08/10/2018 1231   BASOSABS 0.0 08/10/2018 1231   Iron/TIBC/Ferritin/ %Sat No results found for: IRON, TIBC, FERRITIN, IRONPCTSAT Lipid Panel     Component Value Date/Time   CHOL 182 08/10/2018 1231   TRIG 116 08/10/2018 1231   HDL 89 08/10/2018 1231   LDLCALC 70 08/10/2018 1231   Hepatic Function Panel     Component Value Date/Time   PROT 6.5 08/10/2018 1231   ALBUMIN 4.0 08/10/2018 1231   AST 17 08/10/2018 1231   ALT 18 08/10/2018 1231   ALKPHOS 115 08/10/2018 1231   BILITOT 0.3 08/10/2018 1231       Component Value Date/Time   TSH 1.990 08/10/2018 1231   Results for STEVIE, ERTLE (MRN [id number]) as of 11/27/2018 12:29  Ref. Range  08/10/2018 12:31  Vitamin D, 25-Hydroxy Latest Ref Range: 30.0 - 100.0 ng/mL 33.9     OBESITY BEHAVIORAL INTERVENTION VISIT  Today's visit was # 6   Starting weight: 236 lbs Starting date: 08/10/18 Today's weight : Weight: 219 lb (99.3 kg)  Today's date: 11/23/2018 Total lbs lost to date: 17  ASK: We discussed the diagnosis of obesity with Dana Williams today and Dana Williams agreed to give [name] permission to discuss obesity behavioral modification therapy today.  ASSESS: Dana Williams has the diagnosis of obesity and her BMI today is 36.4. Dana Williams is in the action stage of change.   ADVISE: Dana Williams was educated on the multiple health risks of obesity as well as the benefit of weight loss to improve her health. She was advised of the need for long term treatment and the importance of lifestyle modifications to improve her current health and to decrease her risk of future health problems.  AGREE: Multiple dietary modification options and treatment options were discussed and Dana Williams agreed to follow the recommendations documented in the above note.  ARRANGE: Dana Williams was educated on the importance of frequent visits to treat obesity as outlined per CMS and USPSTF guidelines and agreed to schedule her next follow up appointment today.  I, Marcille Blanco, am acting as transcriptionist for Starlyn Skeans, MD  I have reviewed the above documentation for accuracy and completeness, and I agree with the above. -Dennard Nip, MD

## 2018-12-06 ENCOUNTER — Encounter: Payer: Self-pay | Admitting: Allergy

## 2018-12-06 ENCOUNTER — Ambulatory Visit: Payer: 59 | Admitting: Allergy

## 2018-12-06 VITALS — BP 116/78 | HR 98 | Temp 97.6°F | Resp 20 | Ht 65.2 in | Wt 220.2 lb

## 2018-12-06 DIAGNOSIS — T781XXD Other adverse food reactions, not elsewhere classified, subsequent encounter: Secondary | ICD-10-CM

## 2018-12-06 DIAGNOSIS — R21 Rash and other nonspecific skin eruption: Secondary | ICD-10-CM

## 2018-12-06 NOTE — Patient Instructions (Addendum)
Today's skin testing showed: Borderline to shellfish.  Avoid shellfish for now.  For mild symptoms you can take over the counter antihistamines such as Benadryl and monitor symptoms closely. If symptoms worsen or if you have severe symptoms including breathing issues, throat closure, significant swelling, whole body hives, severe diarrhea and vomiting, lightheadedness then seek immediate medical care.  Get bloodwork Alpha-gal panel, shellfish panel, tryptase, ANA with reflex   If this happens again, take picture and see if dermatology can biopsy the rash.   Follow up in 3 months if needed.

## 2018-12-06 NOTE — Progress Notes (Signed)
New Patient Note  RE: Dana Williams MRN: [id number] DOB: 1961/03/03 Date of Office Visit: 12/06/2018  Referring provider: Sydnee Levans, MD Primary care provider: Aretta Nip, MD  Chief Complaint: New Patient (Initial Visit) (hives x 3 onset oct, nov 2019, jan 2020 arms and legs)  History of Present Illness: I had the pleasure of seeing Dana Williams for initial evaluation at the [facility] on 12/07/2018. She is a 58 y.o. female, who is referred here by Dr. Fontaine No (dermatology) for the evaluation of hives.   Patient had 3 episodes of hive outbreak. First episode occurred in October 2019. She was at a CME event during the day and for dinner she had some salmon, green beans, crystal light at a restaurant. At the conference she had yogurt, berries, [place], cheese, baked lays chips. She noticed some red spots on her arm after dinner.   She went home and noticed that it was spreading to her lower legs and arms. Describes them as red and pruritic. She took some benadryl and went to bed. The pruritus took about 1-2 days for it to resolve and the redness took 1-2 weeks to completely resolve.  Second episode occurred in November 2019. [place] [employer] and she had salmon, green beans, and crystal light again. Reaction occurred a few hours after dinner. Patient took benadryl and took 1-2 weeks for the spots to completely resolve.   Third episode occurred in January 2020 and started around 4:30PM. Symptoms worsened after she went out to eat dinner this time. She did not have salmon this time.   The rash does seem to leave some residual mark upon resolution but it's not bruising. Associated symptoms include: none. Suspected triggers are unknown. Denies any fevers, chills, changes in medications, foods, personal care products or recent infections.  Patient started a new diet in October but the first episode occurred before she started the diet.    Patient has eaten all these foods since then with no issues. Does not eat shellfish on a regular basis though.   She has tried the following therapies: benadryl with some benefit but no systemic steroids.   Previous work up includes: saw dermatology and was concerned about food allergy. No previous skin biopsy.  Previous history of rash/hives: no. Patient is up to date with the following cancer screening tests: pap smear, mammogram, colonoscopy.  Assessment and Plan: Dana Williams is a 58 y.o. female with: Rash and other nonspecific skin eruption 3 episodes of rash/hive outbreak since October 2019. No specific triggers noted but the first 2 episodes occurred after eating at a restaurant. Pruritus usually resolve within 1-2 days and the erythema takes about 1-2 weeks to resolve. Saw dermatology but no clear etiology. No skin biopsy performed.  Today's skin testing showed: Borderline to shellfish.   Based on clinical history and testing results not sure what may be triggering these events. I recommended that she avoids shellfish for now.   For mild symptoms you can take over the counter antihistamines such as Benadryl and monitor symptoms closely. If symptoms worsen or if you have severe symptoms including breathing issues, throat closure, significant swelling, whole body hives, severe diarrhea and vomiting, lightheadedness then seek immediate medical care.  Get bloodwork as below to rule out other etiologies.   If this happens again, advised to take pictures and see if dermatology can biopsy the rash.   Discussed proper skin care measures.   Return in about 3 months (  around 03/07/2019).  Lab Orders     Alpha-Gal Panel     Tryptase     Allergen Profile, Shellfish     ANA w/Reflex  Other allergy screening: Asthma: no Rhino conjunctivitis: no Food allergy: no  Past work up includes: None Dietary History: patient has been eating other foods including milk, eggs, peanut, treenuts, sesame,  shellfish, seafood, soy, wheat, meats, fruits and vegetables.  Medication allergy: yes  Some type of sulfa. Hymenoptera allergy: no Eczema:no History of recurrent infections suggestive of immunodeficency: no  Diagnostics: Skin Testing: Food allergy panel. Positive test to: borderline to shellfish mix. Results discussed with patient/family. Food Adult Perc - 12/06/18 0900    Time Antigen Placed  2297    Allergen Manufacturer  Lavella Hammock    Location  Back     Control-buffer 50% Glycerol  Negative    Control-Histamine 1 mg/ml  4+    1. Peanut  Negative    2. Soybean  Negative    3. Wheat  Negative    4. Sesame  Negative    5. Milk, cow  Negative    6. Egg White, Chicken  Negative    7. Casein  Negative    8. Shellfish Mix  --   +/-   9. Fish Mix  Negative    10. Cashew  Negative    11. Pecan Food  Negative    12. [place]  Negative    13. Almond  Negative    14. Hazelnut  Negative    15. [place] nut  Negative    16. Coconut  Negative    17. Pistachio  Negative    18. Catfish  Negative    19. Bass  Negative    20. Trout  Negative    21. Tuna  Negative    22. Salmon  Negative    23. Flounder  Negative    24. Codfish  Negative    25. Shrimp  Negative    26. Crab  Negative    27. Lobster  Negative    28. Oyster  Negative    29. Scallops  Negative    30. Barley  Negative    31. Oat   Negative    32. Rye   Negative    33. Hops  Negative    34. Rice  Negative    35. Cottonseed  Negative    36. Saccharomyces Cerevisiae   Negative    37. Pork  Negative    38. [place] Meat  Negative    39. Chicken Meat  Negative    40. Beef  Negative    41. Lamb  Negative    42. Tomato  Negative    43. White Potato  Negative    44. Sweet Potato  Negative    45. Pea, Green/English  Negative    46. Navy Bean  Negative    47. Mushrooms  Negative    48. Avocado  Negative    49. Onion  Negative    50. Cabbage  Negative    51. Carrots  Negative    52. Celery  Negative    53. Corn   Negative    54. Cucumber  Negative    55. Grape (White seedless)  Negative    56. Orange   Negative    57. Banana  Negative    58. Apple  Negative    59. Peach  Negative    60. Strawberry  Negative  61. Cantaloupe  Negative    62. Watermelon  Negative    63. Pineapple  Negative    64. Chocolate/Cacao bean  Negative    65. Karaya Gum  Negative    66. Acacia (Arabic Gum)  Negative    67. Cinnamon  Negative    68. Nutmeg  Negative    69. Ginger  Negative    70. Garlic  Negative    71. Pepper, black  Negative    72. Mustard  Negative    Comments  --       Past Medical History: Patient Active Problem List   Diagnosis Date Noted  . Adverse food reaction 12/07/2018  . Rash and other nonspecific skin eruption 12/06/2018  . Primary localized osteoarthritis of left knee 10/27/2016  . Personal history of colonic polyp - adenoma 01/29/2014  . Hyperlipidemia 04/18/2012   Past Medical History:  Diagnosis Date  . Arthritis   . Arthritis   . Arthritis of pelvic region   . Depression   . Dry skin   . Fatigue   . GERD (gastroesophageal reflux disease)   . H/O psoriatic arthritis    DX  age 19---- in remission for 20 years  . History of hiatal hernia   . History of kidney stones   . Hyperlipidemia   . Joint pain   . Kidney stones   . Obesity   . Personal history of colonic polyp - adenoma 01/29/2014  . Plantar fasciitis of left foot   . PONV (postoperative nausea and vomiting)   . Postmenopausal   . Psoriasis   . Renal calculus, left   . Sigmoid diverticulosis   . Urticaria   . Vitamin D deficiency    Past Surgical History: Past Surgical History:  Procedure Laterality Date  . CLOSED MANIPULATION SHOULDER    . COLONOSCOPY WITH PROPOFOL  01-25-2014  . CYSTO/  BILATERAL RETROGRADE PYELOGRAM/  BILATERAL URETEROSCOPIC LASER LITHOTRIPSY STONE EXTRACTIONS/  BILATERAL STENT PLACEMENT  02-13-2010  . CYSTOSCOPY WITH RETROGRADE PYELOGRAM, URETEROSCOPY AND STENT PLACEMENT Left  04/21/2015   Procedure: CYSTOSCOPY WITH RETROGRADE PYELOGRAM, URETEROSCOPY, STONE EXTRACTION AND STENT PLACEMENT;  Surgeon: Franchot Gallo, MD;  Location: [facility];  Service: Urology;  Laterality: Left;  . CYSTOURETHROSCOPY/  PLACEMENT LYNX SUPRAPUBIC SLING  05-24-2008  . HAND TENDON SURGERY Right 1993  . HOLMIUM LASER APPLICATION Left 05/20/1949   Procedure: HOLMIUM LASER APPLICATION;  Surgeon: Franchot Gallo, MD;  Location: [facility];  Service: Urology;  Laterality: Left;  . LAPAROSCOPIC CHOLECYSTECTOMY  01-31-2002  . LEFT URETEROSCOPIC STONE EXTRACTION/  STENT PLACEMENT  10-06-2005  . RHINOPLASTY  2007  . RHINOPLASTY    . STRABISMUS SURGERY Left age 56  . TOTAL KNEE ARTHROPLASTY Left 10/27/2016   Procedure: TOTAL KNEE ARTHROPLASTY;  Surgeon: [name], MD;  Location: [place];  Service: Orthopedics;  Laterality: Left;  . WISDOM TOOTH EXTRACTION  age 54   Medication List:  Current Outpatient Medications  Medication Sig Dispense Refill  . ammonium lactate (LAC-HYDRIN) 12 % lotion Apply 1 application topically daily.     . Azelaic Acid (FINACEA) 15 % cream Apply 1 application topically daily.     . calcipotriene-betamethasone (TACLONEX) external suspension Apply 1 application topically daily as needed.     [name] esomeprazole (NEXIUM) 40 MG capsule Take 40 mg by mouth daily before breakfast.    . Multiple Vitamins-Minerals (MULTIVITAMIN/EXTRA VITAMIN D3) CHEW Chew 2 tablets by mouth daily.    . norethindrone-ethinyl estradiol (  FEMHRT 1/5) 1-5 MG-MCG TABS tablet Take 1 tablet by mouth daily.   12  . rosuvastatin (CRESTOR) 10 MG tablet Take 10 mg by mouth daily.    . Vitamin D, Ergocalciferol, (DRISDOL) 1.25 MG (50000 UT) CAPS capsule Take 1 capsule (50,000 Units total) by mouth every 7 (seven) days. 5 capsule 0  . metFORMIN (GLUCOPHAGE) 500 MG tablet Take 1 tablet (500 mg total) by mouth daily with breakfast. (Patient not taking: Reported on 12/06/2018) 30  tablet 0   No current facility-administered medications for this visit.    Allergies: Allergies  Allergen Reactions  . Sulfa Antibiotics Other (See Comments)    Mouth ulcers   Social History: Social History   Socioeconomic History  . Marital status: Single    Spouse name: Not on file  . Number of children: Not on file  . Years of education: Not on file  . Highest education level: Not on file  Occupational History  . Occupation: Therapist, sports  Social Needs  . Financial resource strain: Not on file  . Food insecurity:    Worry: Not on file    Inability: Not on file  . Transportation needs:    Medical: Not on file    Non-medical: Not on file  Tobacco Use  . Smoking status: Never Smoker  . Smokeless tobacco: Never Used  Substance and Sexual Activity  . Alcohol use: No  . Drug use: No  . Sexual activity: Not on file  Lifestyle  . Physical activity:    Days per week: Not on file    Minutes per session: Not on file  . Stress: Not on file  Relationships  . Social connections:    Talks on phone: Not on file    Gets together: Not on file    Attends religious service: Not on file    Active member of club or organization: Not on file    Attends meetings of clubs or organizations: Not on file    Relationship status: Not on file  Other Topics Concern  . Not on file  Social History Narrative  . Not on file   Lives in a [address] year old condo. Smoking: denies Occupation: Therapist, sports HistoryFreight forwarder in the house: no Charity fundraiser in the family room: no Carpet in the bedroom: no Heating: gas Cooling: central Pet: no  Family History: Family History  Problem Relation Age of Onset  . Stomach cancer Paternal Grandmother   . Hypertension Mother   . Hyperlipidemia Mother   . Thyroid disease Mother   . Obesity Mother   . Colon cancer Neg Hx   . Esophageal cancer Neg Hx   . Rectal cancer Neg Hx   . Allergic rhinitis Neg Hx   . Asthma Neg Hx   . Eczema Neg Hx   .  Urticaria Neg Hx     Review of Systems  Constitutional: Negative for appetite change, chills, fever and unexpected weight change.  HENT: Negative for congestion and rhinorrhea.   Eyes: Negative for itching.  Respiratory: Negative for cough, chest tightness, shortness of breath and wheezing.   Cardiovascular: Negative for chest pain.  Gastrointestinal: Negative for abdominal pain.  Genitourinary: Negative for difficulty urinating.  Skin: Positive for rash.  Allergic/Immunologic: Negative for environmental allergies and food allergies.  Neurological: Negative for headaches.   Objective: BP 116/78 (BP Location: Left Arm, Patient Position: Sitting, Cuff Size: Normal)   Pulse 98   Temp 97.6 F (36.4 C) (Oral)   Resp  20   Ht 5' 5.2" (1.656 m)   Wt 220 lb 3.2 oz (99.9 kg)   SpO2 96%   BMI 36.42 kg/m  Body mass index is 36.42 kg/m. Physical Exam  Constitutional: She is oriented to person, place, and time. She appears well-developed and well-nourished.  HENT:  Head: Normocephalic and atraumatic.  Right Ear: External ear normal.  Left Ear: External ear normal.  Nose: Nose normal.  Mouth/Throat: Oropharynx is clear and moist.  Eyes: Conjunctivae and EOM are normal.  Neck: Neck supple.  Cardiovascular: Normal rate, regular rhythm and normal heart sounds. Exam reveals no gallop and no friction rub.  No murmur heard. Pulmonary/Chest: Effort normal and breath sounds normal. She has no wheezes. She has no rales.  Abdominal: Soft.  Lymphadenopathy:    She has no cervical adenopathy.  Neurological: She is alert and oriented to person, place, and time.  Skin: Skin is warm. Rash noted.  Residual circular flat rash on lower extremities and upper extremities b/l.  Psychiatric: She has a normal mood and affect. Her behavior is normal.  Nursing note and vitals reviewed.  The plan was reviewed with the patient/family, and all questions/concerned were addressed.  It was my pleasure to see  Dana Williams today and participate in her care. Please feel free to contact me with any questions or concerns.  Sincerely,  Rexene Alberts, DO Allergy & Immunology  [facility] office: [phone] [facility] office: [phone]

## 2018-12-07 DIAGNOSIS — T781XXA Other adverse food reactions, not elsewhere classified, initial encounter: Secondary | ICD-10-CM | POA: Insufficient documentation

## 2018-12-07 NOTE — Assessment & Plan Note (Signed)
3 episodes of rash/hive outbreak since October 2019. No specific triggers noted but the first 2 episodes occurred after eating at a restaurant. Pruritus usually resolve within 1-2 days and the erythema takes about 1-2 weeks to resolve. Saw dermatology but no clear etiology. No skin biopsy performed.  Today's skin testing showed: Borderline to shellfish.   Based on clinical history and testing results not sure what may be triggering these events. I recommended that she avoids shellfish for now.   For mild symptoms you can take over the counter antihistamines such as Benadryl and monitor symptoms closely. If symptoms worsen or if you have severe symptoms including breathing issues, throat closure, significant swelling, whole body hives, severe diarrhea and vomiting, lightheadedness then seek immediate medical care.  Get bloodwork as below to rule out other etiologies.   If this happens again, advised to take pictures and see if dermatology can biopsy the rash.   Discussed proper skin care measures.

## 2018-12-10 MED FILL — CELECOXIB 200 MG CAP: 200 | 30 days supply | Qty: 60 | Fill #1

## 2018-12-12 ENCOUNTER — Encounter (INDEPENDENT_AMBULATORY_CARE_PROVIDER_SITE_OTHER): Payer: Self-pay | Admitting: Family Medicine

## 2018-12-12 ENCOUNTER — Ambulatory Visit (INDEPENDENT_AMBULATORY_CARE_PROVIDER_SITE_OTHER): Payer: 59 | Admitting: Family Medicine

## 2018-12-12 VITALS — BP 108/68 | HR 72 | Temp 97.3°F | Ht 65.0 in | Wt 216.0 lb

## 2018-12-12 DIAGNOSIS — R7303 Prediabetes: Secondary | ICD-10-CM | POA: Diagnosis not present

## 2018-12-12 DIAGNOSIS — E7849 Other hyperlipidemia: Secondary | ICD-10-CM

## 2018-12-12 DIAGNOSIS — Z9189 Other specified personal risk factors, not elsewhere classified: Secondary | ICD-10-CM

## 2018-12-12 DIAGNOSIS — Z6836 Body mass index (BMI) 36.0-36.9, adult: Secondary | ICD-10-CM | POA: Diagnosis not present

## 2018-12-12 DIAGNOSIS — E559 Vitamin D deficiency, unspecified: Secondary | ICD-10-CM

## 2018-12-12 LAB — ALLERGEN PROFILE, SHELLFISH
Clam IgE: <0.1 kU/L
F023-IgE Crab: <0.1 kU/L
F080-IgE Lobster: <0.1 kU/L
F290-IgE Oyster: <0.1 kU/L
Scallop IgE: <0.1 kU/L
Shrimp IgE: <0.1 kU/L

## 2018-12-12 LAB — ALPHA-GAL PANEL
Alpha Gal IgE*: <0.1 kU/L (ref <0.10)
Beef (Bos spp) IgE: <0.1 kU/L (ref <0.35)
Class Interpretation: 0
Class Interpretation: 0
Class Interpretation: 0
Lamb/Mutton (Ovis spp) IgE: <0.1 kU/L (ref <0.35)
Pork (Sus spp) IgE: <0.1 kU/L (ref <0.35)

## 2018-12-12 LAB — ANA W/REFLEX: Anti Nuclear Antibody(ANA): NEGATIVE

## 2018-12-12 LAB — TRYPTASE: Tryptase: 3.8 ug/L (ref 2.2–13.2)

## 2018-12-12 MED ORDER — VITAMIN D (ERGOCALCIFEROL) 1.25 MG (50000 UNIT) PO CAPS: 50000.0000 [IU] | ORAL_CAPSULE | ORAL | 0 refills | Status: DC

## 2018-12-12 MED FILL — VIT D2 1.25 MG (50,000 UNIT: 1.25 MG | 35 days supply | Qty: 5 | Fill #0

## 2018-12-12 NOTE — Progress Notes (Signed)
Office: [phone]  /  Fax: [phone]   HPI:   Chief Complaint: OBESITY Dana Williams is here to discuss her progress with her obesity treatment plan. She is on the Category 3 plan and is following her eating plan approximately 50 % of the time. She states she is exercising 0 minutes 0 times per week. Dana Williams has done better with getting back on track, but still struggling with sweet cravings and eating all of her food. She is working on getting back to meal planning.  Her weight is 216 lb (98 kg) today and has had a weight loss of 3 pounds over a period of 3 weeks since her last visit. She has lost 20 lbs since starting treatment with [name].  Pre-Diabetes Dana Williams has a diagnosis of pre-diabetes based on her elevated Hgb A1c and was informed this puts her at greater risk of developing diabetes. She was started on metformin last week and is tolerating it well. She continues to work on diet and exercise to decrease risk of diabetes. She denies nausea, vomiting, or hypoglycemia.  At risk for diabetes Dana Williams is at higher than average risk for developing diabetes due to her pre-diabetes and obesity. She currently denies polyuria or polydipsia.  Vitamin D deficiency Dana Williams has a diagnosis of vitamin D deficiency. She is currently stable on vit D and denies nausea, vomiting, or muscle weakness.  Hyperlipidemia Dana Williams has hyperlipidemia and has been trying to improve her cholesterol levels with intensive lifestyle modification including a low saturated fat diet, exercise and weight loss. She is on Crestor and is working on her diet. She is due for labs today and denies any chest pain or myalgias.  ASSESSMENT AND PLAN:  Prediabetes - Plan: Hemoglobin A1c, Insulin, random  Vitamin D deficiency - Plan: VITAMIN D 25 Hydroxy (Vit-D Deficiency, Fractures), Vitamin D, Ergocalciferol, (DRISDOL) 1.25 MG (50000 UT) CAPS capsule  Other hyperlipidemia - Plan: Comprehensive metabolic panel, Lipid Panel With LDL/HDL  Ratio  At risk for diabetes mellitus  Class 2 severe obesity with serious comorbidity and body mass index (BMI) of 36.0 to 36.9 in adult, unspecified obesity type [facility])  PLAN:  Pre-Diabetes Dana Williams will continue to work on weight loss, exercise, and decreasing simple carbohydrates in her diet to help decrease the risk of diabetes.She was informed that eating too many simple carbohydrates or too many calories at one sitting increases the likelihood of GI side effects. Dana Williams agreed to continue her diet and metformin. We will order labs today and Dana Williams agreed to follow up with [name] as directed to monitor her progress in 3 weeks.  Diabetes risk counseling Dana Williams was given extended (15 minutes) diabetes prevention counseling today. She is 58 y.o. female and has risk factors for diabetes including pre-diabetes and obesity. We discussed intensive lifestyle modifications today with an emphasis on weight loss as well as increasing exercise and decreasing simple carbohydrates in her diet.  Vitamin D Deficiency Dana Williams was informed that low vitamin D levels contributes to fatigue and are associated with obesity, breast, and colon cancer. She agrees to continue to take prescription Vit D @50 ,000 IU every week #4 with no refills and will follow up for routine testing of vitamin D, at least 2-3 times per year. She was informed of the risk of over-replacement of vitamin D and agrees to not increase her dose unless she discusses this with [name] first. We will check her vitamin D level today and she agrees to follow up in 3 weeks.  Hyperlipidemia Dana Williams was  informed of the American Heart Association Guidelines emphasizing intensive lifestyle modifications as the first line treatment for hyperlipidemia. We discussed many lifestyle modifications today in depth, and Dana Williams will continue to work on decreasing saturated fats such as fatty red meat, butter and many fried foods. She will also increase vegetables and lean protein in  her diet and continue to work on exercise and weight loss efforts. Labs were ordered today. Shantrice will continue Crestor and her diet and will follow up as directed. She agrees with this plan.  Obesity Dana Williams is currently in the action stage of change. As such, her goal is to continue with weight loss efforts. She has agreed to follow the Category 3 plan. Dana Williams has been instructed to work up to a goal of 150 minutes of combined cardio and strengthening exercise per week for weight loss and overall health benefits. We discussed the following Behavioral Modification Strategies today: increasing lean protein intake, decreasing simple carbohydrates, work on meal planning and easy cooking plans, and dealing with family or coworker sabotage.  Dana Williams has agreed to follow up with our clinic in 3 weeks. She was informed of the importance of frequent follow up visits to maximize her success with intensive lifestyle modifications for her multiple health conditions.  ALLERGIES: Allergies  Allergen Reactions  . Sulfa Antibiotics Other (See Comments)    Mouth ulcers    MEDICATIONS: Current Outpatient Medications on File Prior to Visit  Medication Sig Dispense Refill  . ammonium lactate (LAC-HYDRIN) 12 % lotion Apply 1 application topically daily.     . Azelaic Acid (FINACEA) 15 % cream Apply 1 application topically daily.     . calcipotriene-betamethasone (TACLONEX) external suspension Apply 1 application topically daily as needed.     [name] esomeprazole (NEXIUM) 40 MG capsule Take 40 mg by mouth daily before breakfast.    . metFORMIN (GLUCOPHAGE) 500 MG tablet Take 1 tablet (500 mg total) by mouth daily with breakfast. 30 tablet 0  . Multiple Vitamins-Minerals (MULTIVITAMIN/EXTRA VITAMIN D3) CHEW Chew 2 tablets by mouth daily.    . norethindrone-ethinyl estradiol (FEMHRT 1/5) 1-5 MG-MCG TABS tablet Take 1 tablet by mouth daily.   12  . rosuvastatin (CRESTOR) 10 MG tablet Take 10 mg by mouth daily.     No  current facility-administered medications on file prior to visit.     PAST MEDICAL HISTORY: Past Medical History:  Diagnosis Date  . Arthritis   . Arthritis   . Arthritis of pelvic region   . Depression   . Dry skin   . Fatigue   . GERD (gastroesophageal reflux disease)   . H/O psoriatic arthritis    DX  age 49---- in remission for 20 years  . History of hiatal hernia   . History of kidney stones   . Hyperlipidemia   . Joint pain   . Kidney stones   . Obesity   . Personal history of colonic polyp - adenoma 01/29/2014  . Plantar fasciitis of left foot   . PONV (postoperative nausea and vomiting)   . Postmenopausal   . Psoriasis   . Renal calculus, left   . Sigmoid diverticulosis   . Urticaria   . Vitamin D deficiency     PAST SURGICAL HISTORY: Past Surgical History:  Procedure Laterality Date  . CLOSED MANIPULATION SHOULDER    . COLONOSCOPY WITH PROPOFOL  01-25-2014  . CYSTO/  BILATERAL RETROGRADE PYELOGRAM/  BILATERAL URETEROSCOPIC LASER LITHOTRIPSY STONE EXTRACTIONS/  BILATERAL STENT PLACEMENT  02-13-2010  .  CYSTOSCOPY WITH RETROGRADE PYELOGRAM, URETEROSCOPY AND STENT PLACEMENT Left 04/21/2015   Procedure: CYSTOSCOPY WITH RETROGRADE PYELOGRAM, URETEROSCOPY, STONE EXTRACTION AND STENT PLACEMENT;  Surgeon: Franchot Gallo, MD;  Location: [facility];  Service: Urology;  Laterality: Left;  . CYSTOURETHROSCOPY/  PLACEMENT LYNX SUPRAPUBIC SLING  05-24-2008  . HAND TENDON SURGERY Right 1993  . HOLMIUM LASER APPLICATION Left 0/0/3491   Procedure: HOLMIUM LASER APPLICATION;  Surgeon: Franchot Gallo, MD;  Location: [facility];  Service: Urology;  Laterality: Left;  . LAPAROSCOPIC CHOLECYSTECTOMY  01-31-2002  . LEFT URETEROSCOPIC STONE EXTRACTION/  STENT PLACEMENT  10-06-2005  . RHINOPLASTY  2007  . RHINOPLASTY    . STRABISMUS SURGERY Left age 40  . TOTAL KNEE ARTHROPLASTY Left 10/27/2016   Procedure: TOTAL KNEE ARTHROPLASTY;  Surgeon: [name], MD;  Location: [place];  Service: Orthopedics;  Laterality: Left;  . WISDOM TOOTH EXTRACTION  age 65    SOCIAL HISTORY: Social History   Tobacco Use  . Smoking status: Never Smoker  . Smokeless tobacco: Never Used  Substance Use Topics  . Alcohol use: No  . Drug use: No    FAMILY HISTORY: Family History  Problem Relation Age of Onset  . Stomach cancer Paternal Grandmother   . Hypertension Mother   . Hyperlipidemia Mother   . Thyroid disease Mother   . Obesity Mother   . Colon cancer Neg Hx   . Esophageal cancer Neg Hx   . Rectal cancer Neg Hx   . Allergic rhinitis Neg Hx   . Asthma Neg Hx   . Eczema Neg Hx   . Urticaria Neg Hx     ROS: Review of Systems  Constitutional: Positive for weight loss.  Gastrointestinal: Negative for nausea and vomiting.  Genitourinary:       Negative for polyuria.  Musculoskeletal:       Negative for muscle weakness.  Endo/Heme/Allergies: Negative for polydipsia.       Negative for hypoglycemia.    PHYSICAL EXAM: Blood pressure 108/68, pulse 72, temperature (!) 97.3 F (36.3 C), temperature source Oral, height 5\' 5"  (1.651 m), weight 216 lb (98 kg), SpO2 96 %. Body mass index is 35.94 kg/m. Physical Exam Vitals signs reviewed.  Constitutional:      Appearance: Normal appearance. She is obese.  Cardiovascular:     Rate and Rhythm: Normal rate.  Pulmonary:     Effort: Pulmonary effort is normal.  Musculoskeletal: Normal range of motion.  Skin:    General: Skin is warm and dry.  Neurological:     Mental Status: She is alert and oriented to person, place, and time.  Psychiatric:        Mood and Affect: Mood normal.        Behavior: Behavior normal.     RECENT LABS AND TESTS: BMET    Component Value Date/Time   NA 143 08/10/2018 1231   K 4.1 08/10/2018 1231   CL 103 08/10/2018 1231   CO2 23 08/10/2018 1231   GLUCOSE 87 08/10/2018 1231   GLUCOSE 129 (H) 10/28/2016 0530   BUN 10 08/10/2018 1231   CREATININE 0.73  08/10/2018 1231   CALCIUM 9.3 08/10/2018 1231   GFRNONAA 91 08/10/2018 1231   GFRAA 105 08/10/2018 1231   Lab Results  Component Value Date   HGBA1C 5.4 08/10/2018   Lab Results  Component Value Date   INSULIN 10.9 08/10/2018   CBC    Component Value Date/Time   WBC 4.7 08/10/2018  1231   WBC 7.2 10/28/2016 0530   RBC 4.03 08/10/2018 1231   RBC 3.86 (L) 10/28/2016 0530   HGB 11.0 (L) 08/10/2018 1231   HCT 35.6 08/10/2018 1231   PLT 213 10/28/2016 0530   MCV 88 08/10/2018 1231   MCH 27.3 08/10/2018 1231   MCH 29.5 10/28/2016 0530   MCHC 30.9 (L) 08/10/2018 1231   MCHC 32.3 10/28/2016 0530   RDW 13.8 08/10/2018 1231   LYMPHSABS 1.7 08/10/2018 1231   EOSABS 0.1 08/10/2018 1231   BASOSABS 0.0 08/10/2018 1231   Iron/TIBC/Ferritin/ %Sat No results found for: IRON, TIBC, FERRITIN, IRONPCTSAT Lipid Panel     Component Value Date/Time   CHOL 182 08/10/2018 1231   TRIG 116 08/10/2018 1231   HDL 89 08/10/2018 1231   LDLCALC 70 08/10/2018 1231   Hepatic Function Panel     Component Value Date/Time   PROT 6.5 08/10/2018 1231   ALBUMIN 4.0 08/10/2018 1231   AST 17 08/10/2018 1231   ALT 18 08/10/2018 1231   ALKPHOS 115 08/10/2018 1231   BILITOT 0.3 08/10/2018 1231      Component Value Date/Time   TSH 1.990 08/10/2018 1231   Results for ARISTEA, POSADA (MRN [id number]) as of 12/12/2018 14:30  Ref. Range 08/10/2018 12:31  Vitamin D, 25-Hydroxy Latest Ref Range: 30.0 - 100.0 ng/mL 33.9    OBESITY BEHAVIORAL INTERVENTION VISIT  Today's visit was # 7   Starting weight: 236 lbs Starting date: 08/10/18 Today's weight : Weight: 216 lb (98 kg)  Today's date: 12/12/2018 Total lbs lost to date: 20  ASK: We discussed the diagnosis of obesity with Berneta Sages today and Ileene Hutchinson agreed to give [name] permission to discuss obesity behavioral modification therapy today.  ASSESS: Naveya has the diagnosis of obesity and her BMI today is 35.9. Lam is in the action stage of change.     ADVISE: Jillann was educated on the multiple health risks of obesity as well as the benefit of weight loss to improve her health. She was advised of the need for long term treatment and the importance of lifestyle modifications to improve her current health and to decrease her risk of future health problems.  AGREE: Multiple dietary modification options and treatment options were discussed and Rosine agreed to follow the recommendations documented in the above note.  ARRANGE: Shirlene was educated on the importance of frequent visits to treat obesity as outlined per CMS and USPSTF guidelines and agreed to schedule her next follow up appointment today.  I, Marcille Blanco, am acting as transcriptionist for Starlyn Skeans, MD  I have reviewed the above documentation for accuracy and completeness, and I agree with the above. -Dennard Nip, MD

## 2018-12-13 ENCOUNTER — Encounter: Payer: Self-pay | Admitting: Allergy

## 2018-12-13 LAB — COMPREHENSIVE METABOLIC PANEL
ALT: 17 IU/L (ref 0–32)
AST: 17 IU/L (ref 0–40)
Albumin/Globulin Ratio: 2 (ref 1.2–2.2)
Albumin: 4.3 g/dL (ref 3.8–4.9)
Alkaline Phosphatase: 91 IU/L (ref 39–117)
BUN/Creatinine Ratio: 19 (ref 9–23)
BUN: 13 mg/dL (ref 6–24)
Bilirubin Total: 0.3 mg/dL (ref 0.0–1.2)
CO2: 23 mmol/L (ref 20–29)
Calcium: 9.2 mg/dL (ref 8.7–10.2)
Chloride: 105 mmol/L (ref 96–106)
Creatinine, Ser: 0.69 mg/dL (ref 0.57–1.00)
GFR calc Af Amer: 111 mL/min/{1.73_m2} (ref >59)
GFR calc non Af Amer: 96 mL/min/{1.73_m2} (ref >59)
Globulin, Total: 2.1 g/dL (ref 1.5–4.5)
Glucose: 101 mg/dL — ABNORMAL HIGH (ref 65–99)
Potassium: 4.6 mmol/L (ref 3.5–5.2)
Sodium: 142 mmol/L (ref 134–144)
Total Protein: 6.4 g/dL (ref 6.0–8.5)

## 2018-12-13 LAB — HEMOGLOBIN A1C
Est. average glucose Bld gHb Est-mCnc: 108 mg/dL
Hgb A1c MFr Bld: 5.4 % (ref 4.8–5.6)

## 2018-12-13 LAB — LIPID PANEL WITH LDL/HDL RATIO
Cholesterol, Total: 149 mg/dL (ref 100–199)
HDL: 79 mg/dL (ref >39)
LDL Calculated: 56 mg/dL (ref 0–99)
LDl/HDL Ratio: 0.7 ratio (ref 0.0–3.2)
Triglycerides: 69 mg/dL (ref 0–149)
VLDL Cholesterol Cal: 14 mg/dL (ref 5–40)

## 2018-12-13 LAB — VITAMIN D 25 HYDROXY (VIT D DEFICIENCY, FRACTURES): Vit D, 25-Hydroxy: 62.8 ng/mL (ref 30.0–100.0)

## 2018-12-13 LAB — INSULIN, RANDOM: INSULIN: 11.5 u[IU]/mL (ref 2.6–24.9)

## 2018-12-13 MED FILL — ESTRADIOL-NORETHINDRONE ACE: 1-0.5 | 84 days supply | Qty: 84 | Fill #2

## 2018-12-13 MED FILL — ALPRAZolam 0.5 MG TABS: 0.5 | 30 days supply | Qty: 30 | Fill #0

## 2019-01-03 ENCOUNTER — Encounter (INDEPENDENT_AMBULATORY_CARE_PROVIDER_SITE_OTHER): Payer: Self-pay | Admitting: Family Medicine

## 2019-01-03 ENCOUNTER — Ambulatory Visit (INDEPENDENT_AMBULATORY_CARE_PROVIDER_SITE_OTHER): Payer: 59 | Admitting: Family Medicine

## 2019-01-03 VITALS — BP 119/80 | HR 87 | Temp 97.7°F | Ht 65.0 in | Wt 220.0 lb

## 2019-01-03 DIAGNOSIS — Z9189 Other specified personal risk factors, not elsewhere classified: Secondary | ICD-10-CM

## 2019-01-03 DIAGNOSIS — E559 Vitamin D deficiency, unspecified: Secondary | ICD-10-CM | POA: Diagnosis not present

## 2019-01-03 DIAGNOSIS — E8881 Metabolic syndrome: Secondary | ICD-10-CM | POA: Diagnosis not present

## 2019-01-03 DIAGNOSIS — Z6836 Body mass index (BMI) 36.0-36.9, adult: Secondary | ICD-10-CM | POA: Diagnosis not present

## 2019-01-03 MED ORDER — METFORMIN HCL 500 MG PO TABS: 500.0000 mg | ORAL_TABLET | Freq: Every day | ORAL | 0 refills | Status: DC

## 2019-01-03 MED ORDER — VITAMIN D (ERGOCALCIFEROL) 1.25 MG (50000 UNIT) PO CAPS: 50000.0000 [IU] | ORAL_CAPSULE | ORAL | 0 refills | Status: DC

## 2019-01-03 MED FILL — metFORMIN HCL 500 MG TABS: 500 | 30 days supply | Qty: 30 | Fill #0

## 2019-01-04 MED FILL — VIT D2 1.25 MG (50,000 UNIT: 1.25 MG | 28 days supply | Qty: 4 | Fill #0

## 2019-01-04 NOTE — Progress Notes (Signed)
Office: [phone]  /  Fax: [phone]   HPI:   Chief Complaint: OBESITY Dana Williams is here to discuss her progress with her obesity treatment plan. She is on the Category 3 plan and is following her eating plan approximately 35 to 40 % of the time. She states she is exercising 0 minutes 0 times per week. Dana Williams is retaining fluid with increased simple carbs. She notes increased work stress and reduced meal planning. She states that she is ready to get back on track.  Her weight is 220 lb (99.8 kg) today and has had a weight gain of 4 pounds over a period of 3 weeks since her last visit. She has lost 16 lbs since starting treatment with [name].  Insulin Resistance Dana Williams has a diagnosis of insulin resistance based on her elevated fasting insulin level >5. Although Dana Williams's blood glucose readings are still under good control, insulin resistance puts her at greater risk of metabolic syndrome and diabetes. She is stable on metformin currently, but is struggling with her diet prescription. She continues to work on diet and exercise to decrease risk of diabetes.  Vitamin D deficiency Dana Williams has a diagnosis of vitamin D deficiency. She is currently taking vit D, but is not at goal. She denies nausea, vomiting, or muscle weakness.  At risk for osteopenia and osteoporosis Dana Williams is at higher risk of osteopenia and osteoporosis due to vitamin D deficiency.   ASSESSMENT AND PLAN:  Vitamin D deficiency - Plan: Vitamin D, Ergocalciferol, (DRISDOL) 1.25 MG (50000 UT) CAPS capsule  Insulin resistance - Plan: metFORMIN (GLUCOPHAGE) 500 MG tablet  At risk for osteoporosis  Class 2 severe obesity with serious comorbidity and body mass index (BMI) of 36.0 to 36.9 in adult, unspecified obesity type ([place])  PLAN:  Insulin Resistance Dana Williams will continue to work on weight loss, exercise, and decreasing simple carbohydrates in her diet to help decrease the risk of diabetes. She was informed that eating too many  simple carbohydrates or too many calories at one sitting increases the likelihood of GI side effects. Dana Williams agreed to continue metformin 500mg  with breakfast #30 with no refills and prescription was written today. Dana Williams agreed to follow up with [name] as directed to monitor her progress in 3 weeks.  Vitamin D Deficiency Dana Williams was informed that low vitamin D levels contributes to fatigue and are associated with obesity, breast, and colon cancer. Zuma agrees to continue to take prescription Vit D @50 ,000 IU every week #4 with no refills and will follow up for routine testing of vitamin D, at least 2-3 times per year. She was informed of the risk of over-replacement of vitamin D and agrees to not increase her dose unless she discusses this with [name] first. Dana Williams agrees to follow up in 3 weeks as directed.  At risk for osteopenia and osteoporosis Dana Williams was given extended (15 minutes) osteoporosis prevention counseling today. Dana Williams is at risk for osteopenia and osteoporsis due to her vitamin D deficiency. She was encouraged to take her vitamin D and follow her higher calcium diet and increase strengthening exercise to help strengthen her bones and decrease her risk of osteopenia and osteoporosis.  Obesity Dana Williams is currently in the action stage of change. As such, her goal is to continue with weight loss efforts. She has agreed to follow the Category 3 plan. Dana Williams has been instructed to work up to a goal of 150 minutes of combined cardio and strengthening exercise per week for weight loss and overall health benefits.  We discussed the following Behavioral Modification Stratagies today: increasing lean protein intake, decreasing simple carbohydrates, work on meal planning and easy cooking plans, dealing with family or coworker sabotage, better snacking choices, and avoiding temptations.  Dana Williams has agreed to follow up with our clinic in 3 weeks. She was informed of the importance of frequent follow up visits to  maximize her success with intensive lifestyle modifications for her multiple health conditions.  ALLERGIES: Allergies  Allergen Reactions  . Sulfa Antibiotics Other (See Comments)    Mouth ulcers    MEDICATIONS: Current Outpatient Medications on File Prior to Visit  Medication Sig Dispense Refill  . ammonium lactate (LAC-HYDRIN) 12 % lotion Apply 1 application topically daily.     . Azelaic Acid (FINACEA) 15 % cream Apply 1 application topically daily.     . calcipotriene-betamethasone (TACLONEX) external suspension Apply 1 application topically daily as needed.     [name] esomeprazole (NEXIUM) 40 MG capsule Take 40 mg by mouth daily before breakfast.    . Multiple Vitamins-Minerals (MULTIVITAMIN/EXTRA VITAMIN D3) CHEW Chew 2 tablets by mouth daily.    . norethindrone-ethinyl estradiol (FEMHRT 1/5) 1-5 MG-MCG TABS tablet Take 1 tablet by mouth daily.   12  . rosuvastatin (CRESTOR) 10 MG tablet Take 10 mg by mouth daily.     No current facility-administered medications on file prior to visit.     PAST MEDICAL HISTORY: Past Medical History:  Diagnosis Date  . Arthritis   . Arthritis   . Arthritis of pelvic region   . Depression   . Dry skin   . Fatigue   . GERD (gastroesophageal reflux disease)   . H/O psoriatic arthritis    DX  age 34---- in remission for 20 years  . History of hiatal hernia   . History of kidney stones   . Hyperlipidemia   . Joint pain   . Kidney stones   . Obesity   . Personal history of colonic polyp - adenoma 01/29/2014  . Plantar fasciitis of left foot   . PONV (postoperative nausea and vomiting)   . Postmenopausal   . Psoriasis   . Renal calculus, left   . Sigmoid diverticulosis   . Urticaria   . Vitamin D deficiency     PAST SURGICAL HISTORY: Past Surgical History:  Procedure Laterality Date  . CLOSED MANIPULATION SHOULDER    . COLONOSCOPY WITH PROPOFOL  01-25-2014  . CYSTO/  BILATERAL RETROGRADE PYELOGRAM/  BILATERAL URETEROSCOPIC LASER  LITHOTRIPSY STONE EXTRACTIONS/  BILATERAL STENT PLACEMENT  02-13-2010  . CYSTOSCOPY WITH RETROGRADE PYELOGRAM, URETEROSCOPY AND STENT PLACEMENT Left 04/21/2015   Procedure: CYSTOSCOPY WITH RETROGRADE PYELOGRAM, URETEROSCOPY, STONE EXTRACTION AND STENT PLACEMENT;  Surgeon: Franchot Gallo, MD;  Location: [facility];  Service: Urology;  Laterality: Left;  . CYSTOURETHROSCOPY/  PLACEMENT LYNX SUPRAPUBIC SLING  05-24-2008  . HAND TENDON SURGERY Right 1993  . HOLMIUM LASER APPLICATION Left 02/19/6545   Procedure: HOLMIUM LASER APPLICATION;  Surgeon: Franchot Gallo, MD;  Location: [facility];  Service: Urology;  Laterality: Left;  . LAPAROSCOPIC CHOLECYSTECTOMY  01-31-2002  . LEFT URETEROSCOPIC STONE EXTRACTION/  STENT PLACEMENT  10-06-2005  . RHINOPLASTY  2007  . RHINOPLASTY    . STRABISMUS SURGERY Left age 53  . TOTAL KNEE ARTHROPLASTY Left 10/27/2016   Procedure: TOTAL KNEE ARTHROPLASTY;  Surgeon: [name], MD;  Location: [place];  Service: Orthopedics;  Laterality: Left;  . WISDOM TOOTH EXTRACTION  age 64    SOCIAL HISTORY: Social  History   Tobacco Use  . Smoking status: Never Smoker  . Smokeless tobacco: Never Used  Substance Use Topics  . Alcohol use: No  . Drug use: No    FAMILY HISTORY: Family History  Problem Relation Age of Onset  . Stomach cancer Paternal Grandmother   . Hypertension Mother   . Hyperlipidemia Mother   . Thyroid disease Mother   . Obesity Mother   . Colon cancer Neg Hx   . Esophageal cancer Neg Hx   . Rectal cancer Neg Hx   . Allergic rhinitis Neg Hx   . Asthma Neg Hx   . Eczema Neg Hx   . Urticaria Neg Hx     ROS: Review of Systems  Constitutional: Negative for weight loss.  Gastrointestinal: Negative for nausea and vomiting.  Musculoskeletal:       Negative for muscle weakness.  Endo/Heme/Allergies:       Negative for hypoglycemia.    PHYSICAL EXAM: Blood pressure 119/80, pulse 87, temperature 97.7 F  (36.5 C), temperature source Oral, height 5\' 5"  (1.651 m), weight 220 lb (99.8 kg), SpO2 96 %. Body mass index is 36.61 kg/m. Physical Exam Vitals signs reviewed.  Constitutional:      Appearance: Normal appearance. She is obese.  Cardiovascular:     Rate and Rhythm: Normal rate.  Pulmonary:     Effort: Pulmonary effort is normal.  Musculoskeletal: Normal range of motion.  Skin:    General: Skin is warm and dry.  Neurological:     Mental Status: She is alert and oriented to person, place, and time.  Psychiatric:        Mood and Affect: Mood normal.        Behavior: Behavior normal.     RECENT LABS AND TESTS: BMET    Component Value Date/Time   NA 142 12/12/2018 1209   K 4.6 12/12/2018 1209   CL 105 12/12/2018 1209   CO2 23 12/12/2018 1209   GLUCOSE 101 (H) 12/12/2018 1209   GLUCOSE 129 (H) 10/28/2016 0530   BUN 13 12/12/2018 1209   CREATININE 0.69 12/12/2018 1209   CALCIUM 9.2 12/12/2018 1209   GFRNONAA 96 12/12/2018 1209   GFRAA 111 12/12/2018 1209   Lab Results  Component Value Date   HGBA1C 5.4 12/12/2018   HGBA1C 5.4 08/10/2018   Lab Results  Component Value Date   INSULIN 11.5 12/12/2018   INSULIN 10.9 08/10/2018   CBC    Component Value Date/Time   WBC 4.7 08/10/2018 1231   WBC 7.2 10/28/2016 0530   RBC 4.03 08/10/2018 1231   RBC 3.86 (L) 10/28/2016 0530   HGB 11.0 (L) 08/10/2018 1231   HCT 35.6 08/10/2018 1231   PLT 213 10/28/2016 0530   MCV 88 08/10/2018 1231   MCH 27.3 08/10/2018 1231   MCH 29.5 10/28/2016 0530   MCHC 30.9 (L) 08/10/2018 1231   MCHC 32.3 10/28/2016 0530   RDW 13.8 08/10/2018 1231   LYMPHSABS 1.7 08/10/2018 1231   EOSABS 0.1 08/10/2018 1231   BASOSABS 0.0 08/10/2018 1231   Iron/TIBC/Ferritin/ %Sat No results found for: IRON, TIBC, FERRITIN, IRONPCTSAT Lipid Panel     Component Value Date/Time   CHOL 149 12/12/2018 1209   TRIG 69 12/12/2018 1209   HDL 79 12/12/2018 1209   LDLCALC 56 12/12/2018 1209   Hepatic  Function Panel     Component Value Date/Time   PROT 6.4 12/12/2018 1209   ALBUMIN 4.3 12/12/2018 1209   AST 17 12/12/2018  1209   ALT 17 12/12/2018 1209   ALKPHOS 91 12/12/2018 1209   BILITOT 0.3 12/12/2018 1209      Component Value Date/Time   TSH 1.990 08/10/2018 1231   Results for KRISINDA, GIOVANNI (MRN [id number]) as of 01/04/2019 08:14  Ref. Range 12/12/2018 12:09  Vitamin D, 25-Hydroxy Latest Ref Range: 30.0 - 100.0 ng/mL 62.8    OBESITY BEHAVIORAL INTERVENTION VISIT  Today's visit was # 8   Starting weight: 236 lbs Starting date: 08/10/18 Today's weight : Weight: 220 lb (99.8 kg)  Today's date: 01/03/2019 Total lbs lost to date: 16   01/03/2019  Height 5\' 5"  (1.651 m)  Weight 220 lb (99.8 kg)  BMI (Calculated) 36.61  BLOOD PRESSURE - SYSTOLIC 322  BLOOD PRESSURE - DIASTOLIC 80   Body Fat % 02.5 %  Total Body Water (lbs) 84.2 lbs     ASK: We discussed the diagnosis of obesity with Berneta Sages today and Leilanni agreed to give [name] permission to discuss obesity behavioral modification therapy today.  ASSESS: Martrice has the diagnosis of obesity and her BMI today is 36.6. Taylour is in the action stage of change.   ADVISE: Dianah was educated on the multiple health risks of obesity as well as the benefit of weight loss to improve her health. She was advised of the need for long term treatment and the importance of lifestyle modifications to improve her current health and to decrease her risk of future health problems.  AGREE: Multiple dietary modification options and treatment options were discussed and Airabella agreed to follow the recommendations documented in the above note.  ARRANGE: Zayanna was educated on the importance of frequent visits to treat obesity as outlined per CMS and USPSTF guidelines and agreed to schedule her next follow up appointment today.  IMarcille Blanco, CMA, am acting as transcriptionist for Starlyn Skeans, MD  I have reviewed the above  documentation for accuracy and completeness, and I agree with the above. -Dennard Nip, MD

## 2019-01-11 DIAGNOSIS — M25571 Pain in right ankle and joints of right foot: Secondary | ICD-10-CM | POA: Diagnosis not present

## 2019-01-11 DIAGNOSIS — M25572 Pain in left ankle and joints of left foot: Secondary | ICD-10-CM | POA: Diagnosis not present

## 2019-01-15 DIAGNOSIS — S93492A Sprain of other ligament of left ankle, initial encounter: Secondary | ICD-10-CM | POA: Diagnosis not present

## 2019-01-15 DIAGNOSIS — S92354A Nondisplaced fracture of fifth metatarsal bone, right foot, initial encounter for closed fracture: Secondary | ICD-10-CM | POA: Diagnosis not present

## 2019-01-16 MED FILL — traMADol HCL 50 MG TABS: 50 | 5 days supply | Qty: 40 | Fill #0

## 2019-01-22 ENCOUNTER — Ambulatory Visit (INDEPENDENT_AMBULATORY_CARE_PROVIDER_SITE_OTHER): Payer: Self-pay | Admitting: Bariatrics

## 2019-01-29 DIAGNOSIS — S92354D Nondisplaced fracture of fifth metatarsal bone, right foot, subsequent encounter for fracture with routine healing: Secondary | ICD-10-CM | POA: Diagnosis not present

## 2019-01-30 MED FILL — ROSUVASTATIN CALCIUM 10 MG: 10 | 30 days supply | Qty: 30 | Fill #0

## 2019-01-30 MED FILL — ESOMEPRAZOLE MAG DR 40 MG C: 40 | 30 days supply | Qty: 30 | Fill #0

## 2019-02-07 ENCOUNTER — Encounter (INDEPENDENT_AMBULATORY_CARE_PROVIDER_SITE_OTHER): Payer: Self-pay

## 2019-02-12 ENCOUNTER — Ambulatory Visit (INDEPENDENT_AMBULATORY_CARE_PROVIDER_SITE_OTHER): Payer: Self-pay | Admitting: Family Medicine

## 2019-02-12 ENCOUNTER — Encounter (INDEPENDENT_AMBULATORY_CARE_PROVIDER_SITE_OTHER): Payer: Self-pay

## 2019-02-12 DIAGNOSIS — M25572 Pain in left ankle and joints of left foot: Secondary | ICD-10-CM | POA: Diagnosis not present

## 2019-02-12 DIAGNOSIS — S92354D Nondisplaced fracture of fifth metatarsal bone, right foot, subsequent encounter for fracture with routine healing: Secondary | ICD-10-CM | POA: Diagnosis not present

## 2019-02-27 DIAGNOSIS — K219 Gastro-esophageal reflux disease without esophagitis: Secondary | ICD-10-CM | POA: Diagnosis not present

## 2019-02-27 DIAGNOSIS — E7800 Pure hypercholesterolemia, unspecified: Secondary | ICD-10-CM | POA: Diagnosis not present

## 2019-02-27 MED FILL — ESOMEPRAZOLE MAG DR 40 MG C: 40 | 90 days supply | Qty: 90 | Fill #0

## 2019-02-27 MED FILL — ROSUVASTATIN CALCIUM 10 MG: 10 | 90 days supply | Qty: 90 | Fill #0

## 2019-02-27 MED FILL — ESTRADIOL-NORETHINDRONE ACE: 1-0.5 | 28 days supply | Qty: 28 | Fill #3

## 2019-02-28 DIAGNOSIS — M25572 Pain in left ankle and joints of left foot: Secondary | ICD-10-CM | POA: Diagnosis not present

## 2019-03-05 DIAGNOSIS — R21 Rash and other nonspecific skin eruption: Secondary | ICD-10-CM | POA: Diagnosis not present

## 2019-03-05 DIAGNOSIS — Z85828 Personal history of other malignant neoplasm of skin: Secondary | ICD-10-CM | POA: Diagnosis not present

## 2019-03-05 DIAGNOSIS — L400 Psoriasis vulgaris: Secondary | ICD-10-CM | POA: Diagnosis not present

## 2019-03-05 DIAGNOSIS — L308 Other specified dermatitis: Secondary | ICD-10-CM | POA: Diagnosis not present

## 2019-03-05 DIAGNOSIS — L309 Dermatitis, unspecified: Secondary | ICD-10-CM | POA: Diagnosis not present

## 2019-03-19 DIAGNOSIS — M25572 Pain in left ankle and joints of left foot: Secondary | ICD-10-CM | POA: Diagnosis not present

## 2019-03-19 DIAGNOSIS — M25571 Pain in right ankle and joints of right foot: Secondary | ICD-10-CM | POA: Diagnosis not present

## 2019-03-20 ENCOUNTER — Ambulatory Visit (INDEPENDENT_AMBULATORY_CARE_PROVIDER_SITE_OTHER): Payer: 59 | Admitting: Family Medicine

## 2019-03-20 ENCOUNTER — Other Ambulatory Visit: Payer: Self-pay

## 2019-03-20 ENCOUNTER — Encounter (INDEPENDENT_AMBULATORY_CARE_PROVIDER_SITE_OTHER): Payer: Self-pay | Admitting: Family Medicine

## 2019-03-20 DIAGNOSIS — Z6836 Body mass index (BMI) 36.0-36.9, adult: Secondary | ICD-10-CM

## 2019-03-20 DIAGNOSIS — E559 Vitamin D deficiency, unspecified: Secondary | ICD-10-CM

## 2019-03-20 MED ORDER — VITAMIN D (ERGOCALCIFEROL) 1.25 MG (50000 UNIT) PO CAPS: 50000.0000 [IU] | ORAL_CAPSULE | ORAL | 0 refills | Status: DC

## 2019-03-20 MED FILL — BETAMETHASONE DP AUG 0.05%: 0.05 | 15 days supply | Qty: 50 | Fill #0

## 2019-03-20 MED FILL — ALPRAZolam 0.5 MG TABS: 0.5 | 30 days supply | Qty: 30 | Fill #1

## 2019-03-20 MED FILL — valACYclovir HCL 1 GM TABS: 1 | 5 days supply | Qty: 20 | Fill #0

## 2019-03-20 MED FILL — AMMONIUM LACTATE 12% LOTION: 12 | 60 days supply | Qty: 800 | Fill #2

## 2019-03-20 MED FILL — VIT D2 1.25 MG (50,000 UNIT: 1.25 MG | 28 days supply | Qty: 4 | Fill #0

## 2019-03-21 ENCOUNTER — Encounter (INDEPENDENT_AMBULATORY_CARE_PROVIDER_SITE_OTHER): Payer: Self-pay

## 2019-03-21 NOTE — Progress Notes (Signed)
Office: [phone]  /  Fax: [phone] TeleHealth Visit:  Dana Williams has verbally consented to this TeleHealth visit today. The patient is located at home, the provider is located at the [facility] office. The participants in this visit include the listed provider and patient. The visit was conducted today via [email or web address].  HPI:   Chief Complaint: OBESITY Dana Williams is here to discuss her progress with her obesity treatment plan. She is on the Category 3 plan and is following her eating plan approximately 10 % of the time. She states she is walking 14,000 steps 3 times per week. Dana Williams's last visit was approximately 3 months ago. She had a fall and was out of work, but is back to work. She gained about 5 pounds and is frustrated and stating that she needs to get back on track.   We were unable to weigh the patient today for this TeleHealth visit. She feels as if she has gained weight since her last visit. She has lost 16 lbs since starting treatment with [name].  Vitamin D Deficiency Dana Williams has a diagnosis of vitamin D deficiency. She is currently on vit D and her last level of 62.8 on 12/12/18. Dana Williams hasn't spent hardly anytime outside.  ASSESSMENT AND PLAN:  Vitamin D deficiency - Plan: Vitamin D, Ergocalciferol, (DRISDOL) 1.25 MG (50000 UT) CAPS capsule  Class 2 severe obesity with serious comorbidity and body mass index (BMI) of 36.0 to 36.9 in adult, unspecified obesity type ([place])  PLAN:  Vitamin D Deficiency Dana Williams was informed that low vitamin D levels contribute to fatigue and are associated with obesity, breast, and colon cancer. Dana Williams agrees to continue to take prescription Vit D @50 ,000 IU every week #4 with no refills and will follow up for routine testing of vitamin D, at least 2-3 times per year. She was informed of the risk of over-replacement of vitamin D and agrees to not increase her dose unless she discusses this with [name] first. Dana Williams agrees to follow up in 3 to 4  weeks as directed.  I spent > than 50% of the 25 minute visit on counseling as documented in the note.  Obesity Dana Williams is currently in the action stage of change. As such, her goal is to continue with weight loss efforts. She has agreed to was given multiple options that she could follow, such as the Category 3 plan or keeping a food journal of 1500 to 90+ grams of protein. Dana Williams has been instructed to work up to a goal of 150 minutes of combined cardio and strengthening exercise per week for weight loss and overall health benefits. We discussed the following Behavioral Modification Strategies today: increasing lean protein intake, work on meal planning and easy cooking plans, emotional eating strategies, keeping healthy foods in the home, and ways to avoid boredom eating.  Dana Williams has agreed to follow up with our clinic in 3 to 4 weeks. She was informed of the importance of frequent follow up visits to maximize her success with intensive lifestyle modifications for her multiple health conditions.  ALLERGIES: Allergies  Allergen Reactions  . Sulfa Antibiotics Other (See Comments)    Mouth ulcers    MEDICATIONS: Current Outpatient Medications on File Prior to Visit  Medication Sig Dispense Refill  . ammonium lactate (LAC-HYDRIN) 12 % lotion Apply 1 application topically daily.     . Azelaic Acid (FINACEA) 15 % cream Apply 1 application topically daily.     . calcipotriene-betamethasone (TACLONEX) external suspension Apply  1 application topically daily as needed.     [name] esomeprazole (NEXIUM) 40 MG capsule Take 40 mg by mouth daily before breakfast.    . metFORMIN (GLUCOPHAGE) 500 MG tablet Take 1 tablet (500 mg total) by mouth daily with breakfast. 30 tablet 0  . Multiple Vitamins-Minerals (MULTIVITAMIN/EXTRA VITAMIN D3) CHEW Chew 2 tablets by mouth daily.    . norethindrone-ethinyl estradiol (FEMHRT 1/5) 1-5 MG-MCG TABS tablet Take 1 tablet by mouth daily.   12  . rosuvastatin (CRESTOR) 10  MG tablet Take 10 mg by mouth daily.     No current facility-administered medications on file prior to visit.     PAST MEDICAL HISTORY: Past Medical History:  Diagnosis Date  . Arthritis   . Arthritis   . Arthritis of pelvic region   . Depression   . Dry skin   . Fatigue   . GERD (gastroesophageal reflux disease)   . H/O psoriatic arthritis    DX  age 45---- in remission for 20 years  . History of hiatal hernia   . History of kidney stones   . Hyperlipidemia   . Joint pain   . Kidney stones   . Obesity   . Personal history of colonic polyp - adenoma 01/29/2014  . Plantar fasciitis of left foot   . PONV (postoperative nausea and vomiting)   . Postmenopausal   . Psoriasis   . Renal calculus, left   . Sigmoid diverticulosis   . Urticaria   . Vitamin D deficiency     PAST SURGICAL HISTORY: Past Surgical History:  Procedure Laterality Date  . CLOSED MANIPULATION SHOULDER    . COLONOSCOPY WITH PROPOFOL  01-25-2014  . CYSTO/  BILATERAL RETROGRADE PYELOGRAM/  BILATERAL URETEROSCOPIC LASER LITHOTRIPSY STONE EXTRACTIONS/  BILATERAL STENT PLACEMENT  02-13-2010  . CYSTOSCOPY WITH RETROGRADE PYELOGRAM, URETEROSCOPY AND STENT PLACEMENT Left 04/21/2015   Procedure: CYSTOSCOPY WITH RETROGRADE PYELOGRAM, URETEROSCOPY, STONE EXTRACTION AND STENT PLACEMENT;  Surgeon: Franchot Gallo, MD;  Location: [facility];  Service: Urology;  Laterality: Left;  . CYSTOURETHROSCOPY/  PLACEMENT LYNX SUPRAPUBIC SLING  05-24-2008  . HAND TENDON SURGERY Right 1993  . HOLMIUM LASER APPLICATION Left 01/17/3298   Procedure: HOLMIUM LASER APPLICATION;  Surgeon: Franchot Gallo, MD;  Location: [facility];  Service: Urology;  Laterality: Left;  . LAPAROSCOPIC CHOLECYSTECTOMY  01-31-2002  . LEFT URETEROSCOPIC STONE EXTRACTION/  STENT PLACEMENT  10-06-2005  . RHINOPLASTY  2007  . RHINOPLASTY    . STRABISMUS SURGERY Left age 89  . TOTAL KNEE ARTHROPLASTY Left 10/27/2016    Procedure: TOTAL KNEE ARTHROPLASTY;  Surgeon: [name], MD;  Location: [place];  Service: Orthopedics;  Laterality: Left;  . WISDOM TOOTH EXTRACTION  age 40    SOCIAL HISTORY: Social History   Tobacco Use  . Smoking status: Never Smoker  . Smokeless tobacco: Never Used  Substance Use Topics  . Alcohol use: No  . Drug use: No    FAMILY HISTORY: Family History  Problem Relation Age of Onset  . Stomach cancer Paternal Grandmother   . Hypertension Mother   . Hyperlipidemia Mother   . Thyroid disease Mother   . Obesity Mother   . Colon cancer Neg Hx   . Esophageal cancer Neg Hx   . Rectal cancer Neg Hx   . Allergic rhinitis Neg Hx   . Asthma Neg Hx   . Eczema Neg Hx   . Urticaria Neg Hx     ROS: Review of Systems  Gastrointestinal: Negative for nausea and vomiting.  Musculoskeletal:       Negative for muscle weakness.    PHYSICAL EXAM: Pt in no acute distress  RECENT LABS AND TESTS: BMET    Component Value Date/Time   NA 142 12/12/2018 1209   K 4.6 12/12/2018 1209   CL 105 12/12/2018 1209   CO2 23 12/12/2018 1209   GLUCOSE 101 (H) 12/12/2018 1209   GLUCOSE 129 (H) 10/28/2016 0530   BUN 13 12/12/2018 1209   CREATININE 0.69 12/12/2018 1209   CALCIUM 9.2 12/12/2018 1209   GFRNONAA 96 12/12/2018 1209   GFRAA 111 12/12/2018 1209   Lab Results  Component Value Date   HGBA1C 5.4 12/12/2018   HGBA1C 5.4 08/10/2018   Lab Results  Component Value Date   INSULIN 11.5 12/12/2018   INSULIN 10.9 08/10/2018   CBC    Component Value Date/Time   WBC 4.7 08/10/2018 1231   WBC 7.2 10/28/2016 0530   RBC 4.03 08/10/2018 1231   RBC 3.86 (L) 10/28/2016 0530   HGB 11.0 (L) 08/10/2018 1231   HCT 35.6 08/10/2018 1231   PLT 213 10/28/2016 0530   MCV 88 08/10/2018 1231   MCH 27.3 08/10/2018 1231   MCH 29.5 10/28/2016 0530   MCHC 30.9 (L) 08/10/2018 1231   MCHC 32.3 10/28/2016 0530   RDW 13.8 08/10/2018 1231   LYMPHSABS 1.7 08/10/2018 1231   EOSABS 0.1  08/10/2018 1231   BASOSABS 0.0 08/10/2018 1231   Iron/TIBC/Ferritin/ %Sat No results found for: IRON, TIBC, FERRITIN, IRONPCTSAT Lipid Panel     Component Value Date/Time   CHOL 149 12/12/2018 1209   TRIG 69 12/12/2018 1209   HDL 79 12/12/2018 1209   LDLCALC 56 12/12/2018 1209   Hepatic Function Panel     Component Value Date/Time   PROT 6.4 12/12/2018 1209   ALBUMIN 4.3 12/12/2018 1209   AST 17 12/12/2018 1209   ALT 17 12/12/2018 1209   ALKPHOS 91 12/12/2018 1209   BILITOT 0.3 12/12/2018 1209      Component Value Date/Time   TSH 1.990 08/10/2018 1231   Results for JASA, DUNDON (MRN [id number]) as of 03/21/2019 08:36  Ref. Range 12/12/2018 12:09  Vitamin D, 25-Hydroxy Latest Ref Range: 30.0 - 100.0 ng/mL 62.8     I, Marcille Blanco, CMA, am acting as transcriptionist for Starlyn Skeans, MD I have reviewed the above documentation for accuracy and completeness, and I agree with the above. -Dennard Nip, MD

## 2019-04-10 DIAGNOSIS — L308 Other specified dermatitis: Secondary | ICD-10-CM | POA: Diagnosis not present

## 2019-04-10 DIAGNOSIS — R21 Rash and other nonspecific skin eruption: Secondary | ICD-10-CM | POA: Diagnosis not present

## 2019-04-10 DIAGNOSIS — L0889 Other specified local infections of the skin and subcutaneous tissue: Secondary | ICD-10-CM | POA: Diagnosis not present

## 2019-04-10 MED FILL — VALACYCLOVIR HCL 500 MG TAB: 500 | 90 days supply | Qty: 90 | Fill #0

## 2019-04-10 MED FILL — hydrOXYzine HCL 25 MG TABS: 25 | 30 days supply | Qty: 60 | Fill #0

## 2019-04-12 ENCOUNTER — Encounter (INDEPENDENT_AMBULATORY_CARE_PROVIDER_SITE_OTHER): Payer: Self-pay | Admitting: Family Medicine

## 2019-04-16 ENCOUNTER — Other Ambulatory Visit: Payer: Self-pay

## 2019-04-16 ENCOUNTER — Ambulatory Visit (INDEPENDENT_AMBULATORY_CARE_PROVIDER_SITE_OTHER): Payer: 59 | Admitting: Family Medicine

## 2019-04-16 ENCOUNTER — Encounter (INDEPENDENT_AMBULATORY_CARE_PROVIDER_SITE_OTHER): Payer: Self-pay | Admitting: Family Medicine

## 2019-04-16 DIAGNOSIS — Z6836 Body mass index (BMI) 36.0-36.9, adult: Secondary | ICD-10-CM | POA: Diagnosis not present

## 2019-04-16 DIAGNOSIS — E559 Vitamin D deficiency, unspecified: Secondary | ICD-10-CM

## 2019-04-16 MED ORDER — VITAMIN D (ERGOCALCIFEROL) 1.25 MG (50000 UNIT) PO CAPS: 50000.0000 [IU] | ORAL_CAPSULE | ORAL | 0 refills | Status: DC

## 2019-04-16 MED FILL — VIT D2 1.25 MG (50,000 UNIT: 1.25 MG | 28 days supply | Qty: 4 | Fill #0

## 2019-04-16 NOTE — Progress Notes (Signed)
Office: [phone]  /  Fax: [phone] TeleHealth Visit:  Dana Williams has verbally consented to this TeleHealth visit today. The patient is located at home, the provider is located at the [employer] [facility] office. The participants in this visit include the listed provider and patient. The visit was conducted today via [email or web address].  HPI:   Chief Complaint: OBESITY Dana Williams is here to discuss her progress with her obesity treatment plan. She is on the Category 3 plan and is following her eating plan approximately 70 % of the time. She states she is walking 20 to 30 minutes 4 to 5 times per week. Dana Williams continues to do well with weight loss. She is walking most days of the week in addition to doing 10,000 steps per day at work. Her hunger is controlled and she is happy with her progress.  We were unable to weigh the patient today for this TeleHealth visit. She feels as if she has lost weight since her last visit. She has lost 16 lbs since starting treatment with [name].  Vitamin D Deficiency Dana Williams has a diagnosis of vitamin D deficiency. She is currently on prescription vit D and her last level was at goal. Dana Williams denies nausea, vomiting, or muscle weakness.  ASSESSMENT AND PLAN:  Vitamin D deficiency - Plan: Vitamin D, Ergocalciferol, (DRISDOL) 1.25 MG (50000 UT) CAPS capsule  Class 2 severe obesity with serious comorbidity and body mass index (BMI) of 36.0 to 36.9 in adult, unspecified obesity type ([place])  PLAN:  Vitamin D Deficiency Dana Williams was informed that low vitamin D levels contribute to fatigue and are associated with obesity, breast, and colon cancer. Dana Williams agrees to continue to take prescription Vit D @50 ,000 IU every week #4 with no refills and will follow up for routine testing of vitamin D, at least 2-3 times per year. She was informed of the risk of over-replacement of vitamin D and agrees to not increase her dose unless she discusses this with [name] first. We will check labs at  her next visit. Dana Williams agrees to follow up in 2 weeks as directed.  Obesity Dana Williams is currently in the action stage of change. As such, her goal is to continue with weight loss efforts. She has agreed to keep a food journal with 1500 calories and 90 grams of protein.  Dana Williams has been instructed to work up to a goal of 150 minutes of combined cardio and strengthening exercise per week for weight loss and overall health benefits. We discussed the following Behavioral Modification Strategies today: dealing with family or coworker sabotage and no skipping meals.  Dana Williams has agreed to follow up with our clinic in 2 weeks. She was informed of the importance of frequent follow up visits to maximize her success with intensive lifestyle modifications for her multiple health conditions.  ALLERGIES: Allergies  Allergen Reactions  . Sulfa Antibiotics Other (See Comments)    Mouth ulcers    MEDICATIONS: Current Outpatient Medications on File Prior to Visit  Medication Sig Dispense Refill  . ammonium lactate (LAC-HYDRIN) 12 % lotion Apply 1 application topically daily.     . Azelaic Acid (FINACEA) 15 % cream Apply 1 application topically daily.     . calcipotriene-betamethasone (TACLONEX) external suspension Apply 1 application topically daily as needed.     Dana Williams Kitchen esomeprazole (NEXIUM) 40 MG capsule Take 40 mg by mouth daily before breakfast.    . Multiple Vitamins-Minerals (MULTIVITAMIN/EXTRA VITAMIN D3) CHEW Chew 2 tablets by mouth daily.    Dana Williams Kitchen  norethindrone-ethinyl estradiol (FEMHRT 1/5) 1-5 MG-MCG TABS tablet Take 1 tablet by mouth daily.   12  . rosuvastatin (CRESTOR) 10 MG tablet Take 10 mg by mouth daily.    . valACYclovir (VALTREX) 500 MG tablet Take 500 mg by mouth daily.     No current facility-administered medications on file prior to visit.     PAST MEDICAL HISTORY: Past Medical History:  Diagnosis Date  . Arthritis   . Arthritis   . Arthritis of pelvic region   . Depression   . Dry skin    . Fatigue   . GERD (gastroesophageal reflux disease)   . H/O psoriatic arthritis    DX  age 68---- in remission for 20 years  . History of hiatal hernia   . History of kidney stones   . Hyperlipidemia   . Joint pain   . Kidney stones   . Obesity   . Personal history of colonic polyp - adenoma 01/29/2014  . Plantar fasciitis of left foot   . PONV (postoperative nausea and vomiting)   . Postmenopausal   . Psoriasis   . Renal calculus, left   . Sigmoid diverticulosis   . Urticaria   . Vitamin D deficiency     PAST SURGICAL HISTORY: Past Surgical History:  Procedure Laterality Date  . CLOSED MANIPULATION SHOULDER    . COLONOSCOPY WITH PROPOFOL  01-25-2014  . CYSTO/  BILATERAL RETROGRADE PYELOGRAM/  BILATERAL URETEROSCOPIC LASER LITHOTRIPSY STONE EXTRACTIONS/  BILATERAL STENT PLACEMENT  02-13-2010  . CYSTOSCOPY WITH RETROGRADE PYELOGRAM, URETEROSCOPY AND STENT PLACEMENT Left 04/21/2015   Procedure: CYSTOSCOPY WITH RETROGRADE PYELOGRAM, URETEROSCOPY, STONE EXTRACTION AND STENT PLACEMENT;  Surgeon: Franchot Gallo, MD;  Location: [facility];  Service: Urology;  Laterality: Left;  . CYSTOURETHROSCOPY/  PLACEMENT LYNX SUPRAPUBIC SLING  05-24-2008  . HAND TENDON SURGERY Right 1993  . HOLMIUM LASER APPLICATION Left 07/18/2354   Procedure: HOLMIUM LASER APPLICATION;  Surgeon: Franchot Gallo, MD;  Location: [facility];  Service: Urology;  Laterality: Left;  . LAPAROSCOPIC CHOLECYSTECTOMY  01-31-2002  . LEFT URETEROSCOPIC STONE EXTRACTION/  STENT PLACEMENT  10-06-2005  . RHINOPLASTY  2007  . RHINOPLASTY    . STRABISMUS SURGERY Left age 24  . TOTAL KNEE ARTHROPLASTY Left 10/27/2016   Procedure: TOTAL KNEE ARTHROPLASTY;  Surgeon: [name], MD;  Location: [facility];  Service: Orthopedics;  Laterality: Left;  . WISDOM TOOTH EXTRACTION  age 31    SOCIAL HISTORY: Social History   Tobacco Use  . Smoking status: Never Smoker  . Smokeless tobacco: Never  Used  Substance Use Topics  . Alcohol use: No  . Drug use: No    FAMILY HISTORY: Family History  Problem Relation Age of Onset  . Stomach cancer Paternal Grandmother   . Hypertension Mother   . Hyperlipidemia Mother   . Thyroid disease Mother   . Obesity Mother   . Colon cancer Neg Hx   . Esophageal cancer Neg Hx   . Rectal cancer Neg Hx   . Allergic rhinitis Neg Hx   . Asthma Neg Hx   . Eczema Neg Hx   . Urticaria Neg Hx     ROS: Review of Systems  Gastrointestinal: Negative for nausea and vomiting.  Musculoskeletal:       Negative for muscle weakness.    PHYSICAL EXAM: Pt in no acute distress  RECENT LABS AND TESTS: BMET    Component Value Date/Time   NA 142 12/12/2018 1209   K  4.6 12/12/2018 1209   CL 105 12/12/2018 1209   CO2 23 12/12/2018 1209   GLUCOSE 101 (H) 12/12/2018 1209   GLUCOSE 129 (H) 10/28/2016 0530   BUN 13 12/12/2018 1209   CREATININE 0.69 12/12/2018 1209   CALCIUM 9.2 12/12/2018 1209   GFRNONAA 96 12/12/2018 1209   GFRAA 111 12/12/2018 1209   Lab Results  Component Value Date   HGBA1C 5.4 12/12/2018   HGBA1C 5.4 08/10/2018   Lab Results  Component Value Date   INSULIN 11.5 12/12/2018   INSULIN 10.9 08/10/2018   CBC    Component Value Date/Time   WBC 4.7 08/10/2018 1231   WBC 7.2 10/28/2016 0530   RBC 4.03 08/10/2018 1231   RBC 3.86 (L) 10/28/2016 0530   HGB 11.0 (L) 08/10/2018 1231   HCT 35.6 08/10/2018 1231   PLT 213 10/28/2016 0530   MCV 88 08/10/2018 1231   MCH 27.3 08/10/2018 1231   MCH 29.5 10/28/2016 0530   MCHC 30.9 (L) 08/10/2018 1231   MCHC 32.3 10/28/2016 0530   RDW 13.8 08/10/2018 1231   LYMPHSABS 1.7 08/10/2018 1231   EOSABS 0.1 08/10/2018 1231   BASOSABS 0.0 08/10/2018 1231   Iron/TIBC/Ferritin/ %Sat No results found for: IRON, TIBC, FERRITIN, IRONPCTSAT Lipid Panel     Component Value Date/Time   CHOL 149 12/12/2018 1209   TRIG 69 12/12/2018 1209   HDL 79 12/12/2018 1209   LDLCALC 56 12/12/2018  1209   Hepatic Function Panel     Component Value Date/Time   PROT 6.4 12/12/2018 1209   ALBUMIN 4.3 12/12/2018 1209   AST 17 12/12/2018 1209   ALT 17 12/12/2018 1209   ALKPHOS 91 12/12/2018 1209   BILITOT 0.3 12/12/2018 1209      Component Value Date/Time   TSH 1.990 08/10/2018 1231   Results for DEWANDA, FENNEMA (MRN [id number]) as of 04/16/2019 12:59  Ref. Range 12/12/2018 12:09  Vitamin D, 25-Hydroxy Latest Ref Range: 30.0 - 100.0 ng/mL 62.8     I, Marcille Blanco, CMA, am acting as transcriptionist for Starlyn Skeans, MD I have reviewed the above documentation for accuracy and completeness, and I agree with the above. -Dennard Nip, MD

## 2019-04-17 DIAGNOSIS — E559 Vitamin D deficiency, unspecified: Secondary | ICD-10-CM | POA: Insufficient documentation

## 2019-04-17 DIAGNOSIS — Z6839 Body mass index (BMI) 39.0-39.9, adult: Secondary | ICD-10-CM | POA: Insufficient documentation

## 2019-04-17 MED FILL — ALPRAZolam 0.5 MG TABS: 0.5 | 30 days supply | Qty: 30 | Fill #2

## 2019-04-25 DIAGNOSIS — M19072 Primary osteoarthritis, left ankle and foot: Secondary | ICD-10-CM | POA: Insufficient documentation

## 2019-04-25 DIAGNOSIS — L409 Psoriasis, unspecified: Secondary | ICD-10-CM | POA: Insufficient documentation

## 2019-04-25 DIAGNOSIS — Z8719 Personal history of other diseases of the digestive system: Secondary | ICD-10-CM | POA: Insufficient documentation

## 2019-04-25 DIAGNOSIS — Z87442 Personal history of urinary calculi: Secondary | ICD-10-CM | POA: Insufficient documentation

## 2019-04-25 DIAGNOSIS — F32A Depression, unspecified: Secondary | ICD-10-CM | POA: Insufficient documentation

## 2019-04-25 DIAGNOSIS — M170 Bilateral primary osteoarthritis of knee: Secondary | ICD-10-CM | POA: Insufficient documentation

## 2019-04-25 DIAGNOSIS — F419 Anxiety disorder, unspecified: Secondary | ICD-10-CM | POA: Insufficient documentation

## 2019-04-25 DIAGNOSIS — F329 Major depressive disorder, single episode, unspecified: Secondary | ICD-10-CM | POA: Insufficient documentation

## 2019-04-25 DIAGNOSIS — M19071 Primary osteoarthritis, right ankle and foot: Secondary | ICD-10-CM | POA: Insufficient documentation

## 2019-04-25 NOTE — Progress Notes (Signed)
Office Visit Note  Patient: Dana Williams             Date of Birth: September 12, 1961           MRN: [id number]             PCP: Aretta Nip, MD Referring: Aretta Nip, MD Visit Date: 04/26/2019 Occupation: _0 @  Subjective:  Positive ANA.   History of Present Illness: Dana Williams is a 58 y.o. female with history of psoriasis and osteoarthritis.  She returns today after her last visit in June 2017.  At the time she was having left knee joint swelling.  She states she had left total knee replacement December 2017.  Since then she has had no recurrence of joint pain or joint swelling.  For psoriasis in her scalp she uses topical agents.  She states in October 2019 and December 2019 she had 2 outbreaks with hives all over.  She had evaluation by dermatologist and allergist but no diagnosis was established.  She had a skin biopsy which was positive for erythema multiforme.  She also had labs done by her dermatologist which showed sed rate of 31 and ANA 1:160  Homogenous pattern.  She is currently using Valtrex which is been helpful in preventing the recurrence of hives.  Activities of Daily Living:  Patient reports morning stiffness for 0 minute.   Patient Denies nocturnal pain.  Difficulty dressing/grooming: Denies Difficulty climbing stairs: Denies Difficulty getting out of chair: Denies Difficulty using hands for taps, buttons, cutlery, and/or writing: Denies  Review of Systems  Constitutional: Positive for weight loss. Negative for fatigue, night sweats and weight gain.       Intentional  HENT: Negative for mouth sores, trouble swallowing, trouble swallowing, mouth dryness and nose dryness.   Eyes: Negative for pain, redness, visual disturbance and dryness.  Respiratory: Negative for cough, shortness of breath, wheezing and difficulty breathing.   Cardiovascular: Negative for chest pain, palpitations, hypertension, irregular heartbeat and swelling in legs/feet.   Gastrointestinal: Negative for blood in stool, constipation and diarrhea.  Endocrine: Negative for excessive thirst and increased urination.  Genitourinary: Negative for vaginal dryness.  Musculoskeletal: Negative for arthralgias, joint pain, joint swelling, myalgias, muscle weakness, morning stiffness, muscle tenderness and myalgias.  Skin: Negative for color change, rash, hair loss, redness, skin tightness, ulcers and sensitivity to sunlight.  Allergic/Immunologic: Negative for susceptible to infections.  Neurological: Negative for dizziness, headaches, memory loss, night sweats and weakness.  Hematological: Negative for bruising/bleeding tendency and swollen glands.  Psychiatric/Behavioral: Negative for depressed mood and sleep disturbance. The patient is not nervous/anxious.     PMFS History:  Patient Active Problem List   Diagnosis Date Noted  . History of kidney stones 04/25/2019  . History of diverticulosis 04/25/2019  . History of gastroesophageal reflux (GERD) 04/25/2019  . Anxiety and depression 04/25/2019  . Psoriasis 04/25/2019  . Primary osteoarthritis of both knees 04/25/2019  . Primary osteoarthritis of both feet 04/25/2019  . Vitamin D deficiency 04/17/2019  . Class 2 severe obesity with serious comorbidity and body mass index (BMI) of 36.0 to 36.9 in adult [facility]) 04/17/2019  . Adverse food reaction 12/07/2018  . Rash and other nonspecific skin eruption 12/06/2018  . Primary localized osteoarthritis of left knee 10/27/2016  . Personal history of colonic polyp - adenoma 01/29/2014  . Hyperlipidemia 04/18/2012    Past Medical History:  Diagnosis Date  . Arthritis   . Arthritis   . Arthritis of pelvic  region   . Depression   . Dry skin   . Erythema multiforme   . Fatigue   . GERD (gastroesophageal reflux disease)   . H/O psoriatic arthritis    DX  age 53---- in remission for 20 years  . History of hiatal hernia   . History of kidney stones   . Hyperlipidemia    . Joint pain   . Kidney stones   . Obesity   . Personal history of colonic polyp - adenoma 01/29/2014  . Plantar fasciitis of left foot   . PONV (postoperative nausea and vomiting)   . Postmenopausal   . Psoriasis   . Renal calculus, left   . Sigmoid diverticulosis   . Urticaria   . Vitamin D deficiency     Family History  Problem Relation Age of Onset  . Stomach cancer Paternal Grandmother   . Hypertension Mother   . Hyperlipidemia Mother   . Thyroid disease Mother   . Obesity Mother   . Colon cancer Neg Hx   . Esophageal cancer Neg Hx   . Rectal cancer Neg Hx   . Allergic rhinitis Neg Hx   . Asthma Neg Hx   . Eczema Neg Hx   . Urticaria Neg Hx    Past Surgical History:  Procedure Laterality Date  . CLOSED MANIPULATION SHOULDER    . COLONOSCOPY WITH PROPOFOL  01-25-2014  . CYSTO/  BILATERAL RETROGRADE PYELOGRAM/  BILATERAL URETEROSCOPIC LASER LITHOTRIPSY STONE EXTRACTIONS/  BILATERAL STENT PLACEMENT  02-13-2010  . CYSTOSCOPY WITH RETROGRADE PYELOGRAM, URETEROSCOPY AND STENT PLACEMENT Left 04/21/2015   Procedure: CYSTOSCOPY WITH RETROGRADE PYELOGRAM, URETEROSCOPY, STONE EXTRACTION AND STENT PLACEMENT;  Surgeon: Franchot Gallo, MD;  Location: [facility];  Service: Urology;  Laterality: Left;  . CYSTOURETHROSCOPY/  PLACEMENT LYNX SUPRAPUBIC SLING  05-24-2008  . HAND TENDON SURGERY Right 1993  . HOLMIUM LASER APPLICATION Left 04/16/354   Procedure: HOLMIUM LASER APPLICATION;  Surgeon: Franchot Gallo, MD;  Location: [facility];  Service: Urology;  Laterality: Left;  . LAPAROSCOPIC CHOLECYSTECTOMY  01-31-2002  . LEFT URETEROSCOPIC STONE EXTRACTION/  STENT PLACEMENT  10-06-2005  . RHINOPLASTY  2007  . RHINOPLASTY    . STRABISMUS SURGERY Left age 56  . TOTAL KNEE ARTHROPLASTY Left 10/27/2016   Procedure: TOTAL KNEE ARTHROPLASTY;  Surgeon: [name], MD;  Location: [facility];  Service: Orthopedics;  Laterality: Left;  . WISDOM TOOTH EXTRACTION   age 62   Social History   Social History Narrative  . Not on file   Immunization History  Administered Date(s) Administered  . Zoster Recombinat (Shingrix) 08/31/2015, 05/31/2018     Objective: Vital Signs: BP 128/80 (BP Location: Left Arm, Patient Position: Sitting, Cuff Size: Normal)   Pulse (!) 103   Resp 12   Ht '5\' 5"'$  (1.651 m)   Wt 226 lb (102.5 kg)   BMI 37.61 kg/m    Physical Exam Vitals signs and nursing note reviewed.  Constitutional:      Appearance: She is well-developed.  HENT:     Head: Normocephalic and atraumatic.  Eyes:     Conjunctiva/sclera: Conjunctivae normal.  Neck:     Musculoskeletal: Normal range of motion.  Cardiovascular:     Rate and Rhythm: Normal rate and regular rhythm.     Heart sounds: Normal heart sounds.  Pulmonary:     Effort: Pulmonary effort is normal.     Breath sounds: Normal breath sounds.  Abdominal:     General: Bowel  sounds are normal.     Palpations: Abdomen is soft.  Lymphadenopathy:     Cervical: No cervical adenopathy.  Skin:    General: Skin is warm and dry.     Capillary Refill: Capillary refill takes less than 2 seconds.  Neurological:     Mental Status: She is alert and oriented to person, place, and time.  Psychiatric:        Behavior: Behavior normal.      Musculoskeletal Exam: C-spine thoracic and lumbar spine good range of motion.  She had no SI joint tenderness.  Shoulder joints elbow joints wrist joints with good range of motion.  She has bilateral MCP thickening and ulnar deviation.  She has DIP and PIP thickening.  No synovitis was noted.  Hip joints with good range of motion.  Left total knee replacement is doing well.  She has some osteoarthritic changes in her feet with PIP thickening no synovitis was noted.  CDAI Exam: CDAI Score: - Patient Global: -; Provider Global: - Swollen: -; Tender: - Joint Exam   No joint exam has been documented for this visit   There is currently no information  documented on the homunculus. Go to the Rheumatology activity and complete the homunculus joint exam.  Investigation: No additional findings.  Imaging: No results found.  Recent Labs: Lab Results  Component Value Date   WBC 4.7 08/10/2018   HGB 11.0 (L) 08/10/2018   PLT 213 10/28/2016   NA 142 12/12/2018   K 4.6 12/12/2018   CL 105 12/12/2018   CO2 23 12/12/2018   GLUCOSE 101 (H) 12/12/2018   BUN 13 12/12/2018   CREATININE 0.69 12/12/2018   BILITOT 0.3 12/12/2018   ALKPHOS 91 12/12/2018   AST 17 12/12/2018   ALT 17 12/12/2018   PROT 6.4 12/12/2018   ALBUMIN 4.3 12/12/2018   CALCIUM 9.2 12/12/2018   GFRAA 111 12/12/2018  May 2017 CBC, CMP, ESR, CK, RF, anti-CCP, '14 3 3 '$ eta negative, Hepatitis panel, HIV, G6PD, immunoglobulins, SPEP, TB Gold were negative  Apr 10, 2019 labs from Dr. Derrel Nip CMP normal, CBC normal, ESR 31, ANA 1: 160 homogeneous,  Speciality Comments: No specialty comments available.  Procedures:  No procedures performed Allergies: Sulfa antibiotics   Assessment / Plan:     Visit Diagnoses: Positive ANA (antinuclear antibody) -patient had recurrent her rash until December and was seen by her dermatologist.  Her labs showed positive ANA.  Today she does not have any clinical features of autoimmune disease.  We discussed in future if she develops any new symptoms I would obtain following labs.  She wanted me to pend the orders in the computer so she can have the labs drawn at the hospital in case she develops a flare.  She will contact us if she develops new symptoms.  Plan: ANA, Anti-scleroderma antibody, RNP Antibody, Anti-Smith antibody, Sjogrens syndrome-A extractable nuclear antibody, Sjogrens syndrome-B extractable nuclear antibody, Anti-DNA antibody, double-stranded, C3 and C4, Beta-2 glycoprotein antibodies, Cardiolipin antibodies, IgG, IgM, IgA, Lupus Anticoagulant Eval w/Reflex,   Psoriasis - Limited to her scalp. -She uses topical steroid cream  prescribed by her dermatologist.  Psoriatic arthropathy ([place]) -patient developed psoriatic arthritis at a young age.  She stated at the time she had dactylitis and synovitis in multiple joints.  Symptoms resolved after she got pregnant with her child.  And did not have any recurrence.  Although she developed lateral deformities in her joints.  When she saw me a few years back she  was having left knee joint effusion but had no symptoms since she had left total knee replacement.  Today she had no synovitis on examination.  Primary osteoarthritis of both knees - Bilateral severe osteoarthritis and psoriatic arthritis.  She had left total knee replacement is doing well.  She does not have much discomfort in her right knee joint currently.  No synovitis was noted.  Primary osteoarthritis of both feet -she has osteoarthritic changes in her feet.  No synovitis was noted.  Other medical problems are listed as follows:  History of kidney stones   History of diverticulosis   History of gastroesophageal reflux (GERD)   Hyperlipidemia, unspecified hyperlipidemia type  Anxiety and depression -   Orders: Orders Placed This Encounter  Procedures  . ANA  . Anti-scleroderma antibody  . RNP Antibody  . Anti-Smith antibody  . Sjogrens syndrome-A extractable nuclear antibody  . Sjogrens syndrome-B extractable nuclear antibody  . Anti-DNA antibody, double-stranded  . C3 and C4  . Beta-2 glycoprotein antibodies  . Cardiolipin antibodies, IgG, IgM, IgA  . Lupus Anticoagulant Eval w/Reflex   No orders of the defined types were placed in this encounter.   Face-to-face time spent with patient was 30 minutes. Greater than 50% of time was spent in counseling and coordination of care.  Follow-Up Instructions: Return in about 6 months (around 10/26/2019), or if symptoms worsen or fail to improve, for Psoriatic arthritis, Osteoarthritis.   Bo Merino, MD  Note - This record has been created using  Editor, commissioning.  Chart creation errors have been sought, but may not always  have been located. Such creation errors do not reflect on  the standard of medical care.

## 2019-04-26 ENCOUNTER — Ambulatory Visit: Payer: 59 | Admitting: Rheumatology

## 2019-04-26 ENCOUNTER — Other Ambulatory Visit: Payer: Self-pay

## 2019-04-26 ENCOUNTER — Encounter: Payer: Self-pay | Admitting: Rheumatology

## 2019-04-26 VITALS — BP 128/80 | HR 103 | Resp 12 | Ht 65.0 in | Wt 226.0 lb

## 2019-04-26 DIAGNOSIS — M170 Bilateral primary osteoarthritis of knee: Secondary | ICD-10-CM

## 2019-04-26 DIAGNOSIS — Z87442 Personal history of urinary calculi: Secondary | ICD-10-CM | POA: Diagnosis not present

## 2019-04-26 DIAGNOSIS — F419 Anxiety disorder, unspecified: Secondary | ICD-10-CM

## 2019-04-26 DIAGNOSIS — Z96652 Presence of left artificial knee joint: Secondary | ICD-10-CM

## 2019-04-26 DIAGNOSIS — F32A Depression, unspecified: Secondary | ICD-10-CM

## 2019-04-26 DIAGNOSIS — E785 Hyperlipidemia, unspecified: Secondary | ICD-10-CM

## 2019-04-26 DIAGNOSIS — L4050 Arthropathic psoriasis, unspecified: Secondary | ICD-10-CM

## 2019-04-26 DIAGNOSIS — M19071 Primary osteoarthritis, right ankle and foot: Secondary | ICD-10-CM

## 2019-04-26 DIAGNOSIS — R768 Other specified abnormal immunological findings in serum: Secondary | ICD-10-CM

## 2019-04-26 DIAGNOSIS — Z8719 Personal history of other diseases of the digestive system: Secondary | ICD-10-CM | POA: Diagnosis not present

## 2019-04-26 DIAGNOSIS — F329 Major depressive disorder, single episode, unspecified: Secondary | ICD-10-CM

## 2019-04-26 DIAGNOSIS — L409 Psoriasis, unspecified: Secondary | ICD-10-CM | POA: Diagnosis not present

## 2019-04-26 DIAGNOSIS — M19072 Primary osteoarthritis, left ankle and foot: Secondary | ICD-10-CM

## 2019-05-01 ENCOUNTER — Encounter (INDEPENDENT_AMBULATORY_CARE_PROVIDER_SITE_OTHER): Payer: Self-pay | Admitting: Family Medicine

## 2019-05-01 ENCOUNTER — Ambulatory Visit (INDEPENDENT_AMBULATORY_CARE_PROVIDER_SITE_OTHER): Payer: 59 | Admitting: Family Medicine

## 2019-05-01 ENCOUNTER — Other Ambulatory Visit: Payer: Self-pay

## 2019-05-01 VITALS — BP 120/71 | HR 81 | Temp 97.9°F | Ht 65.0 in | Wt 215.0 lb

## 2019-05-01 DIAGNOSIS — Z6835 Body mass index (BMI) 35.0-35.9, adult: Secondary | ICD-10-CM | POA: Diagnosis not present

## 2019-05-01 DIAGNOSIS — E559 Vitamin D deficiency, unspecified: Secondary | ICD-10-CM | POA: Diagnosis not present

## 2019-05-01 DIAGNOSIS — Z9189 Other specified personal risk factors, not elsewhere classified: Secondary | ICD-10-CM | POA: Diagnosis not present

## 2019-05-01 MED ORDER — VITAMIN D (ERGOCALCIFEROL) 1.25 MG (50000 UNIT) PO CAPS: 50000.0000 [IU] | ORAL_CAPSULE | ORAL | 0 refills | Status: DC

## 2019-05-01 MED FILL — ESTRADIOL-NORETHINDRONE ACE: 1-0.5 | 84 days supply | Qty: 84 | Fill #0

## 2019-05-01 NOTE — Progress Notes (Signed)
Office: [phone]  /  Fax: [phone]   HPI:   Chief Complaint: OBESITY Dana Williams is here to discuss her progress with her obesity treatment plan. She is on the Category 3 plan and is following her eating plan approximately 60% of the time. She states she is walking 30 minutes 4-5 times per week. Dana Williams's last in office visit was 3 months ago. She has lost 5 lbs in that time but has struggled with some emotional eating without meal planning due to her crazy schedule.  Her weight is 215 lb (97.5 kg) today and has had a weight loss of 5 pounds over a period of 16 weeks since her last in office visit. She has lost 21 lbs since starting treatment with [name].  Vitamin D deficiency Dana Williams has a diagnosis of Vitamin D deficiency. She is currently taking prescription Vit D and denies nausea, vomiting or muscle weakness.  At risk for osteopenia and osteoporosis Dana Williams is at higher risk of osteopenia and osteoporosis due to vitamin D deficiency.   ASSESSMENT AND PLAN:  Vitamin D deficiency - Plan: Vitamin D, Ergocalciferol, (DRISDOL) 1.25 MG (50000 UT) CAPS capsule  At risk for osteoporosis  Class 2 severe obesity with serious comorbidity and body mass index (BMI) of 35.0 to 35.9 in adult, unspecified obesity type ([place])  PLAN:  Vitamin D Deficiency Dana Williams was informed that low Vitamin D levels contributes to fatigue and are associated with obesity, breast, and colon cancer. She agrees to continue to take prescription Vit D @ 50,000 IU every week #4 with 0 refills and will follow-up for routine testing of Vitamin D, at least 2-3 times per year. She was informed of the risk of over-replacement of Vitamin D and agrees to not increase her dose unless she discusses this with [name] first. Dana Williams agrees to follow-up with our clinic in 2 weeks.  At risk for osteopenia and osteoporosis Dana Williams was given extended  (15 minutes) osteoporosis prevention counseling today. Dana Williams is at risk for osteopenia and  osteoporsis due to her Vitamin D deficiency. She was encouraged to take her Vitamin D and follow her higher calcium diet and increase strengthening exercise to help strengthen her bones and decrease her risk of osteopenia and osteoporosis.  Obesity Dana Williams is currently in the action stage of change. As such, her goal is to continue with weight loss efforts. She has agreed to follow the Category 3 plan. Dana Williams has been instructed to work up to a goal of 150 minutes of combined cardio and strengthening exercise per week for weight loss and overall health benefits. We discussed the following Behavioral Modification Strategies today: increasing lean protein intake, decreasing simple carbohydrates, work on meal planning and easy cooking plans.  Dana Williams has agreed to follow-up with our clinic in 2 weeks. She was informed of the importance of frequent follow-up visits to maximize her success with intensive lifestyle modifications for her multiple health conditions.  ALLERGIES: Allergies  Allergen Reactions  . Sulfa Antibiotics Other (See Comments)    Mouth ulcers    MEDICATIONS: Current Outpatient Medications on File Prior to Visit  Medication Sig Dispense Refill  . ammonium lactate (LAC-HYDRIN) 12 % lotion Apply 1 application topically daily.     . Azelaic Acid (FINACEA) 15 % cream Apply 1 application topically daily.     . calcipotriene-betamethasone (TACLONEX) external suspension Apply 1 application topically daily as needed.     [name] esomeprazole (NEXIUM) 40 MG capsule Take 40 mg by mouth daily before breakfast.    .  Multiple Vitamins-Minerals (MULTIVITAMIN/EXTRA VITAMIN D3) CHEW Chew 2 tablets by mouth daily.    . norethindrone-ethinyl estradiol (FEMHRT 1/5) 1-5 MG-MCG TABS tablet Take 1 tablet by mouth daily.   12  . rosuvastatin (CRESTOR) 10 MG tablet Take 10 mg by mouth daily.    . valACYclovir (VALTREX) 500 MG tablet Take 500 mg by mouth daily.     No current facility-administered medications  on file prior to visit.     PAST MEDICAL HISTORY: Past Medical History:  Diagnosis Date  . Arthritis   . Arthritis   . Arthritis of pelvic region   . Depression   . Dry skin   . Erythema multiforme   . Fatigue   . GERD (gastroesophageal reflux disease)   . H/O psoriatic arthritis    DX  age 1---- in remission for 20 years  . History of hiatal hernia   . History of kidney stones   . Hyperlipidemia   . Joint pain   . Kidney stones   . Obesity   . Personal history of colonic polyp - adenoma 01/29/2014  . Plantar fasciitis of left foot   . PONV (postoperative nausea and vomiting)   . Postmenopausal   . Psoriasis   . Renal calculus, left   . Sigmoid diverticulosis   . Urticaria   . Vitamin D deficiency     PAST SURGICAL HISTORY: Past Surgical History:  Procedure Laterality Date  . CLOSED MANIPULATION SHOULDER    . COLONOSCOPY WITH PROPOFOL  01-25-2014  . CYSTO/  BILATERAL RETROGRADE PYELOGRAM/  BILATERAL URETEROSCOPIC LASER LITHOTRIPSY STONE EXTRACTIONS/  BILATERAL STENT PLACEMENT  02-13-2010  . CYSTOSCOPY WITH RETROGRADE PYELOGRAM, URETEROSCOPY AND STENT PLACEMENT Left 04/21/2015   Procedure: CYSTOSCOPY WITH RETROGRADE PYELOGRAM, URETEROSCOPY, STONE EXTRACTION AND STENT PLACEMENT;  Surgeon: Franchot Gallo, MD;  Location: [facility];  Service: Urology;  Laterality: Left;  . CYSTOURETHROSCOPY/  PLACEMENT LYNX SUPRAPUBIC SLING  05-24-2008  . HAND TENDON SURGERY Right 1993  . HOLMIUM LASER APPLICATION Left 03/16/8412   Procedure: HOLMIUM LASER APPLICATION;  Surgeon: Franchot Gallo, MD;  Location: [facility];  Service: Urology;  Laterality: Left;  . LAPAROSCOPIC CHOLECYSTECTOMY  01-31-2002  . LEFT URETEROSCOPIC STONE EXTRACTION/  STENT PLACEMENT  10-06-2005  . RHINOPLASTY  2007  . RHINOPLASTY    . STRABISMUS SURGERY Left age 24  . TOTAL KNEE ARTHROPLASTY Left 10/27/2016   Procedure: TOTAL KNEE ARTHROPLASTY;  Surgeon: [name], MD;   Location: [place];  Service: Orthopedics;  Laterality: Left;  . WISDOM TOOTH EXTRACTION  age 39    SOCIAL HISTORY: Social History   Tobacco Use  . Smoking status: Never Smoker  . Smokeless tobacco: Never Used  Substance Use Topics  . Alcohol use: No  . Drug use: No    FAMILY HISTORY: Family History  Problem Relation Age of Onset  . Stomach cancer Paternal Grandmother   . Hypertension Mother   . Hyperlipidemia Mother   . Thyroid disease Mother   . Obesity Mother   . Colon cancer Neg Hx   . Esophageal cancer Neg Hx   . Rectal cancer Neg Hx   . Allergic rhinitis Neg Hx   . Asthma Neg Hx   . Eczema Neg Hx   . Urticaria Neg Hx     ROS: Review of Systems  Gastrointestinal: Negative for nausea and vomiting.  Musculoskeletal:       Negative for muscle weakness.   PHYSICAL EXAM: Blood pressure 120/71, pulse 81, temperature  97.9 F (36.6 C), temperature source Oral, height 5\' 5"  (1.651 m), weight 215 lb (97.5 kg), SpO2 97 %. Body mass index is 35.78 kg/m. Physical Exam Vitals signs reviewed.  Constitutional:      Appearance: Normal appearance. She is obese.  Cardiovascular:     Rate and Rhythm: Normal rate.     Pulses: Normal pulses.  Pulmonary:     Effort: Pulmonary effort is normal.     Breath sounds: Normal breath sounds.  Musculoskeletal: Normal range of motion.  Skin:    General: Skin is warm and dry.  Neurological:     Mental Status: She is alert and oriented to person, place, and time.  Psychiatric:        Behavior: Behavior normal.   RECENT LABS AND TESTS: BMET    Component Value Date/Time   NA 142 12/12/2018 1209   K 4.6 12/12/2018 1209   CL 105 12/12/2018 1209   CO2 23 12/12/2018 1209   GLUCOSE 101 (H) 12/12/2018 1209   GLUCOSE 129 (H) 10/28/2016 0530   BUN 13 12/12/2018 1209   CREATININE 0.69 12/12/2018 1209   CALCIUM 9.2 12/12/2018 1209   GFRNONAA 96 12/12/2018 1209   GFRAA 111 12/12/2018 1209   Lab Results  Component Value Date   HGBA1C  5.4 12/12/2018   HGBA1C 5.4 08/10/2018   Lab Results  Component Value Date   INSULIN 11.5 12/12/2018   INSULIN 10.9 08/10/2018   CBC    Component Value Date/Time   WBC 4.7 08/10/2018 1231   WBC 7.2 10/28/2016 0530   RBC 4.03 08/10/2018 1231   RBC 3.86 (L) 10/28/2016 0530   HGB 11.0 (L) 08/10/2018 1231   HCT 35.6 08/10/2018 1231   PLT 213 10/28/2016 0530   MCV 88 08/10/2018 1231   MCH 27.3 08/10/2018 1231   MCH 29.5 10/28/2016 0530   MCHC 30.9 (L) 08/10/2018 1231   MCHC 32.3 10/28/2016 0530   RDW 13.8 08/10/2018 1231   LYMPHSABS 1.7 08/10/2018 1231   EOSABS 0.1 08/10/2018 1231   BASOSABS 0.0 08/10/2018 1231   Iron/TIBC/Ferritin/ %Sat No results found for: IRON, TIBC, FERRITIN, IRONPCTSAT Lipid Panel     Component Value Date/Time   CHOL 149 12/12/2018 1209   TRIG 69 12/12/2018 1209   HDL 79 12/12/2018 1209   LDLCALC 56 12/12/2018 1209   Hepatic Function Panel     Component Value Date/Time   PROT 6.4 12/12/2018 1209   ALBUMIN 4.3 12/12/2018 1209   AST 17 12/12/2018 1209   ALT 17 12/12/2018 1209   ALKPHOS 91 12/12/2018 1209   BILITOT 0.3 12/12/2018 1209      Component Value Date/Time   TSH 1.990 08/10/2018 1231   Results for TAYTE, CHILDERS (MRN [id number]) as of 05/01/2019 16:09  Ref. Range 12/12/2018 12:09  Vitamin D, 25-Hydroxy Latest Ref Range: 30.0 - 100.0 ng/mL 62.8    OBESITY BEHAVIORAL INTERVENTION VISIT  Today's visit was #11   Starting weight: 236 lbs Starting date: 08/10/2018 Today's weight: 215 lbs  Today's date: 05/01/2019 Total lbs lost to date: 21  ASK: We discussed the diagnosis of obesity with Dana Williams today and Dana Williams agreed to give [name] permission to discuss obesity behavioral modification therapy today.  ASSESS: Icis has the diagnosis of obesity and her BMI today is 35.9. Dana Williams is in the action stage of change.   ADVISE: Dana Williams was educated on the multiple health risks of obesity as well as the benefit of weight loss to improve  her health. She was advised of the need for long term treatment and the importance of lifestyle modifications to improve her current health and to decrease her risk of future health problems.  AGREE: Multiple dietary modification options and treatment options were discussed and  Dana Williams agreed to follow the recommendations documented in the above note.  ARRANGE: Dana Williams was educated on the importance of frequent visits to treat obesity as outlined per CMS and USPSTF guidelines and agreed to schedule her next follow up appointment today.  I, Michaelene Song, am acting as Location manager for Dennard Nip, MD I have reviewed the above documentation for accuracy and completeness, and I agree with the above. -Dennard Nip, MD

## 2019-05-17 ENCOUNTER — Ambulatory Visit (INDEPENDENT_AMBULATORY_CARE_PROVIDER_SITE_OTHER): Payer: 59 | Admitting: Physician Assistant

## 2019-05-17 ENCOUNTER — Other Ambulatory Visit: Payer: Self-pay

## 2019-05-17 ENCOUNTER — Encounter (INDEPENDENT_AMBULATORY_CARE_PROVIDER_SITE_OTHER): Payer: Self-pay | Admitting: Physician Assistant

## 2019-05-17 VITALS — BP 118/73 | HR 103 | Temp 98.2°F | Ht 65.0 in | Wt 212.0 lb

## 2019-05-17 DIAGNOSIS — D225 Melanocytic nevi of trunk: Secondary | ICD-10-CM | POA: Diagnosis not present

## 2019-05-17 DIAGNOSIS — L518 Other erythema multiforme: Secondary | ICD-10-CM | POA: Diagnosis not present

## 2019-05-17 DIAGNOSIS — E559 Vitamin D deficiency, unspecified: Secondary | ICD-10-CM

## 2019-05-17 DIAGNOSIS — L853 Xerosis cutis: Secondary | ICD-10-CM | POA: Diagnosis not present

## 2019-05-17 DIAGNOSIS — L718 Other rosacea: Secondary | ICD-10-CM | POA: Diagnosis not present

## 2019-05-17 DIAGNOSIS — Z6835 Body mass index (BMI) 35.0-35.9, adult: Secondary | ICD-10-CM | POA: Diagnosis not present

## 2019-05-17 DIAGNOSIS — D2261 Melanocytic nevi of right upper limb, including shoulder: Secondary | ICD-10-CM | POA: Diagnosis not present

## 2019-05-17 DIAGNOSIS — Z85828 Personal history of other malignant neoplasm of skin: Secondary | ICD-10-CM | POA: Diagnosis not present

## 2019-05-17 DIAGNOSIS — L400 Psoriasis vulgaris: Secondary | ICD-10-CM | POA: Diagnosis not present

## 2019-05-17 DIAGNOSIS — L814 Other melanin hyperpigmentation: Secondary | ICD-10-CM | POA: Diagnosis not present

## 2019-05-21 MED FILL — VIT D2 1.25 MG (50,000 UNIT: 1.25 MG | 28 days supply | Qty: 4 | Fill #0

## 2019-05-21 MED FILL — ALPRAZolam 0.5 MG TABS: 0.5 | 30 days supply | Qty: 30 | Fill #3

## 2019-05-22 NOTE — Progress Notes (Signed)
Office: [phone]  /  Fax: [phone]   HPI:   Chief Complaint: OBESITY Yasheka is here to discuss her progress with her obesity treatment plan. She is on the Category 3 plan and is following her eating plan approximately 50 % of the time. She states she is walking 30 minutes 5 times per week. 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 She is not meal planning, but she wants to work on that over the next few days while she is off. Her weight is 212 lb (96.2 kg) today and has had a weight loss of 3 pounds over a period of 2 weeks since her last visit. She has lost 24 lbs since starting treatment with [name].  Vitamin D deficiency Challis has a diagnosis of vitamin D deficiency. She is currently taking vit D and denies nausea, vomiting or muscle weakness.  ASSESSMENT AND PLAN:  Vitamin D deficiency  Class 2 severe obesity with serious comorbidity and body mass index (BMI) of 35.0 to 35.9 in adult, unspecified obesity type ([place])  PLAN:  Vitamin D Deficiency Ericah was informed that low vitamin D levels contributes to fatigue and are associated with obesity, breast, and colon cancer. She will continue to take prescription Vit D @50 ,000 IU every week and will follow up for routine testing of vitamin D, at least 2-3 times per year. She was informed of the risk of over-replacement of vitamin D and agrees to not increase her dose unless she discusses this with [name] first.  I spent > than 50% of the 25 minute visit on counseling as documented in the note.  Obesity Brittinie is currently in the action stage of change. As such, her goal is to continue with weight loss efforts She has agreed to keep a food journal with 450 to 600 calories and 40 grams of protein at supper daily and the Category 3 plan Lynn has been instructed to work up to a goal of 150 minutes of combined cardio and strengthening exercise per week for weight loss and  overall health benefits. We discussed the following Behavioral Modification Strategies today: keeping healthy foods in the home and work on meal planning and easy cooking plans  Xyla has agreed to follow up with our clinic in 2 to 3 weeks. She was informed of the importance of frequent follow up visits to maximize her success with intensive lifestyle modifications for her multiple health conditions.  I spent > than 50% of the 25 minute visit on counseling as documented in the note.    ALLERGIES: Allergies  Allergen Reactions   Sulfa Antibiotics Other (See Comments)    Mouth ulcers    MEDICATIONS: Current Outpatient Medications on File Prior to Visit  Medication Sig Dispense Refill   ammonium lactate (LAC-HYDRIN) 12 % lotion Apply 1 application topically daily.      Azelaic Acid (FINACEA) 15 % cream Apply 1 application topically daily.      calcipotriene-betamethasone (TACLONEX) external suspension Apply 1 application topically daily as needed.      esomeprazole (NEXIUM) 40 MG capsule Take 40 mg by mouth daily before breakfast.     Multiple Vitamins-Minerals (MULTIVITAMIN/EXTRA VITAMIN D3) CHEW Chew 2 tablets by mouth daily.     norethindrone-ethinyl estradiol (FEMHRT 1/5) 1-5 MG-MCG TABS tablet Take 1 tablet by mouth daily.   12   rosuvastatin (CRESTOR) 10 MG tablet Take 10 mg by mouth daily.  valACYclovir (VALTREX) 500 MG tablet Take 500 mg by mouth daily.     Vitamin D, Ergocalciferol, (DRISDOL) 1.25 MG (50000 UT) CAPS capsule Take 1 capsule (50,000 Units total) by mouth every 7 (seven) days. 4 capsule 0   No current facility-administered medications on file prior to visit.     PAST MEDICAL HISTORY: Past Medical History:  Diagnosis Date   Arthritis    Arthritis    Arthritis of pelvic region    Depression    Dry skin    Erythema multiforme    Fatigue    GERD (gastroesophageal reflux disease)    H/O psoriatic arthritis    DX  age 75---- in remission  for 20 years   History of hiatal hernia    History of kidney stones    Hyperlipidemia    Joint pain    Kidney stones    Obesity    Personal history of colonic polyp - adenoma 01/29/2014   Plantar fasciitis of left foot    PONV (postoperative nausea and vomiting)    Postmenopausal    Psoriasis    Renal calculus, left    Sigmoid diverticulosis    Urticaria    Vitamin D deficiency     PAST SURGICAL HISTORY: Past Surgical History:  Procedure Laterality Date   CLOSED MANIPULATION SHOULDER     COLONOSCOPY WITH PROPOFOL  01-25-2014   CYSTO/  BILATERAL RETROGRADE PYELOGRAM/  BILATERAL URETEROSCOPIC LASER LITHOTRIPSY STONE EXTRACTIONS/  BILATERAL STENT PLACEMENT  02-13-2010   CYSTOSCOPY WITH RETROGRADE PYELOGRAM, URETEROSCOPY AND STENT PLACEMENT Left 04/21/2015   Procedure: CYSTOSCOPY WITH RETROGRADE PYELOGRAM, URETEROSCOPY, STONE EXTRACTION AND STENT PLACEMENT;  Surgeon: Franchot Gallo, MD;  Location: [facility];  Service: Urology;  Laterality: Left;   CYSTOURETHROSCOPY/  PLACEMENT LYNX SUPRAPUBIC SLING  05-24-2008   HAND TENDON SURGERY Right 1993   HOLMIUM LASER APPLICATION Left 12/18/7626   Procedure: HOLMIUM LASER APPLICATION;  Surgeon: Franchot Gallo, MD;  Location: [facility];  Service: Urology;  Laterality: Left;   LAPAROSCOPIC CHOLECYSTECTOMY  01-31-2002   LEFT URETEROSCOPIC STONE EXTRACTION/  STENT PLACEMENT  10-06-2005   RHINOPLASTY  2007   RHINOPLASTY     STRABISMUS SURGERY Left age 52   TOTAL KNEE ARTHROPLASTY Left 10/27/2016   Procedure: TOTAL KNEE ARTHROPLASTY;  Surgeon: [name], MD;  Location: [place];  Service: Orthopedics;  Laterality: Left;   WISDOM TOOTH EXTRACTION  age 26    SOCIAL HISTORY: Social History   Tobacco Use   Smoking status: Never Smoker   Smokeless tobacco: Never Used  Substance Use Topics   Alcohol use: No   Drug use: No    FAMILY HISTORY: Family History  Problem  Relation Age of Onset   Stomach cancer Paternal Grandmother    Hypertension Mother    Hyperlipidemia Mother    Thyroid disease Mother    Obesity Mother    Colon cancer Neg Hx    Esophageal cancer Neg Hx    Rectal cancer Neg Hx    Allergic rhinitis Neg Hx    Asthma Neg Hx    Eczema Neg Hx    Urticaria Neg Hx     ROS: Review of Systems  Constitutional: Positive for weight loss.  Gastrointestinal: Negative for nausea and vomiting.  Musculoskeletal:       Negative for muscle weakness    PHYSICAL EXAM: Blood pressure 118/73, pulse (!) 103, temperature 98.2 F (36.8 C), temperature source Oral, height 5\' 5"  (1.651 m), weight 212 lb (  96.2 kg), SpO2 97 %. Body mass index is 35.28 kg/m. Physical Exam Vitals signs reviewed.  Constitutional:      Appearance: Normal appearance. She is well-developed. She is obese.  Cardiovascular:     Rate and Rhythm: Normal rate.  Pulmonary:     Effort: Pulmonary effort is normal.  Musculoskeletal: Normal range of motion.  Skin:    General: Skin is warm and dry.  Neurological:     Mental Status: She is alert and oriented to person, place, and time.  Psychiatric:        Mood and Affect: Mood normal.        Behavior: Behavior normal.     RECENT LABS AND TESTS: BMET    Component Value Date/Time   NA 142 12/12/2018 1209   K 4.6 12/12/2018 1209   CL 105 12/12/2018 1209   CO2 23 12/12/2018 1209   GLUCOSE 101 (H) 12/12/2018 1209   GLUCOSE 129 (H) 10/28/2016 0530   BUN 13 12/12/2018 1209   CREATININE 0.69 12/12/2018 1209   CALCIUM 9.2 12/12/2018 1209   GFRNONAA 96 12/12/2018 1209   GFRAA 111 12/12/2018 1209   Lab Results  Component Value Date   HGBA1C 5.4 12/12/2018   HGBA1C 5.4 08/10/2018   Lab Results  Component Value Date   INSULIN 11.5 12/12/2018   INSULIN 10.9 08/10/2018   CBC    Component Value Date/Time   WBC 4.7 08/10/2018 1231   WBC 7.2 10/28/2016 0530   RBC 4.03 08/10/2018 1231   RBC 3.86 (L)  10/28/2016 0530   HGB 11.0 (L) 08/10/2018 1231   HCT 35.6 08/10/2018 1231   PLT 213 10/28/2016 0530   MCV 88 08/10/2018 1231   MCH 27.3 08/10/2018 1231   MCH 29.5 10/28/2016 0530   MCHC 30.9 (L) 08/10/2018 1231   MCHC 32.3 10/28/2016 0530   RDW 13.8 08/10/2018 1231   LYMPHSABS 1.7 08/10/2018 1231   EOSABS 0.1 08/10/2018 1231   BASOSABS 0.0 08/10/2018 1231   Iron/TIBC/Ferritin/ %Sat No results found for: IRON, TIBC, FERRITIN, IRONPCTSAT Lipid Panel     Component Value Date/Time   CHOL 149 12/12/2018 1209   TRIG 69 12/12/2018 1209   HDL 79 12/12/2018 1209   LDLCALC 56 12/12/2018 1209   Hepatic Function Panel     Component Value Date/Time   PROT 6.4 12/12/2018 1209   ALBUMIN 4.3 12/12/2018 1209   AST 17 12/12/2018 1209   ALT 17 12/12/2018 1209   ALKPHOS 91 12/12/2018 1209   BILITOT 0.3 12/12/2018 1209      Component Value Date/Time   TSH 1.990 08/10/2018 1231     Ref. Range 12/12/2018 12:09  Vitamin D, 25-Hydroxy Latest Ref Range: 30.0 - 100.0 ng/mL 62.8    OBESITY BEHAVIORAL INTERVENTION VISIT  Today's visit was # 12   Starting weight: 236 lbs Starting date: 08/10/2018 Today's weight : 212 lbs Today's date: 05/17/2019 Total lbs lost to date: 24   ASK: We discussed the diagnosis of obesity with Berneta Sages today and Ileene Hutchinson agreed to give [name] permission to discuss obesity behavioral modification therapy today.  ASSESS: Deloris has the diagnosis of obesity and her BMI today is 35.28 Krystina is in the action stage of change   ADVISE: Roxann was educated on the multiple health risks of obesity as well as the benefit of weight loss to improve her health. She was advised of the need for long term treatment and the importance of lifestyle modifications to improve her current health  and to decrease her risk of future health problems.  AGREE: Multiple dietary modification options and treatment options were discussed and  Teriana agreed to follow the recommendations  documented in the above note.  ARRANGE: Silvia was educated on the importance of frequent visits to treat obesity as outlined per CMS and USPSTF guidelines and agreed to schedule her next follow up appointment today.  Corey Skains, am acting as transcriptionist for Abby Potash, PA-C I, Abby Potash, PA-C have reviewed above note and agree with its content

## 2019-06-04 ENCOUNTER — Encounter (INDEPENDENT_AMBULATORY_CARE_PROVIDER_SITE_OTHER): Payer: Self-pay | Admitting: Family Medicine

## 2019-06-04 ENCOUNTER — Ambulatory Visit (INDEPENDENT_AMBULATORY_CARE_PROVIDER_SITE_OTHER): Payer: 59 | Admitting: Family Medicine

## 2019-06-04 ENCOUNTER — Other Ambulatory Visit: Payer: Self-pay

## 2019-06-04 VITALS — BP 113/73 | HR 94 | Temp 97.8°F | Ht 65.0 in | Wt 213.0 lb

## 2019-06-04 DIAGNOSIS — Z6835 Body mass index (BMI) 35.0-35.9, adult: Secondary | ICD-10-CM | POA: Diagnosis not present

## 2019-06-04 DIAGNOSIS — Z9189 Other specified personal risk factors, not elsewhere classified: Secondary | ICD-10-CM

## 2019-06-04 DIAGNOSIS — E559 Vitamin D deficiency, unspecified: Secondary | ICD-10-CM

## 2019-06-04 DIAGNOSIS — E66812 Obesity, class 2: Secondary | ICD-10-CM

## 2019-06-04 MED ORDER — VITAMIN D (ERGOCALCIFEROL) 1.25 MG (50000 UNIT) PO CAPS: 50000.0000 [IU] | ORAL_CAPSULE | ORAL | 0 refills | Status: DC

## 2019-06-05 NOTE — Progress Notes (Signed)
Office: [phone]  /  Fax: [phone]   HPI:   Chief Complaint: OBESITY Dana Williams is here to discuss her progress with her obesity treatment plan. She is on the Category 3 plan and is following her eating plan approximately 70% of the time. She states she is walking 30 minutes 2-3 times per week. [name] has done well with her Category 3 plan but did some celebration eating and eating out this weekend. Overall she is increasing her lean protein and vegetables. Hunger is mostly controlled.  Her weight is 213 lb (96.6 kg) today and has had a weight gain of 1 lb since her last visit. She has lost 23 lbs since starting treatment with [name].  Vitamin D deficiency Dana Williams has a diagnosis of Vitamin D deficiency, which is not yet at goal. She is currently stable on prescription Vit D and denies nausea, vomiting or muscle weakness.  At risk for osteopenia and osteoporosis Dana Williams is at higher risk of osteopenia and osteoporosis due to Vitamin D deficiency.   ASSESSMENT AND PLAN:  Vitamin D deficiency - Plan: Vitamin D, Ergocalciferol, (DRISDOL) 1.25 MG (50000 UT) CAPS capsule  At risk for osteoporosis  Class 2 severe obesity with serious comorbidity and body mass index (BMI) of 35.0 to 35.9 in adult, unspecified obesity type ([place])  PLAN:  Vitamin D Deficiency Dana Williams was informed that low Vitamin D levels contributes to fatigue and are associated with obesity, breast, and colon cancer. She agrees to continue to take prescription Vit D @ 50,000 IU every week #4 with 0 refills and will follow-up for routine testing of Vitamin D in 1 month. She was informed of the risk of over-replacement of Vitamin D and agrees to not increase her dose unless she discusses this with [name] first. Dana Williams agrees to follow-up with our clinic in 2 weeks.  At risk for osteopenia and osteoporosis Dana Williams was given extended  (15 minutes) osteoporosis prevention counseling today. Dana Williams is at risk for osteopenia and osteoporsis due  to her Vitamin D deficiency. She was encouraged to take her Vitamin D and follow her higher calcium diet and increase strengthening exercise to help strengthen her bones and decrease her risk of osteopenia and osteoporosis.  Obesity Dana Williams is currently in the action stage of change. As such, her goal is to continue with weight loss efforts. She has agreed to follow the Category 3 plan and journal 450-600 calories + 40 grams of protein at supper. Cortasia has been instructed to work up to a goal of 150 minutes of combined cardio and strengthening exercise per week for weight loss and overall health benefits. We discussed the following Behavioral Modification Strategies today: increasing lean protein intake, decreasing simple carbohydrates, work on meal planning and easy cooking plans, travel eating strategies, holiday eating strategies, and celebration eating strategies.  Dana Williams has agreed to follow-up with our clinic in 2 weeks. She was informed of the importance of frequent follow-up visits to maximize her success with intensive lifestyle modifications for her multiple health conditions.  ALLERGIES: Allergies  Allergen Reactions  . Sulfa Antibiotics Other (See Comments)    Mouth ulcers    MEDICATIONS: Current Outpatient Medications on File Prior to Visit  Medication Sig Dispense Refill  . ammonium lactate (LAC-HYDRIN) 12 % lotion Apply 1 application topically daily.     . Azelaic Acid (FINACEA) 15 % cream Apply 1 application topically daily.     . calcipotriene-betamethasone (TACLONEX) external suspension Apply 1 application topically daily as needed.     [name]  esomeprazole (NEXIUM) 40 MG capsule Take 40 mg by mouth daily before breakfast.    . Multiple Vitamins-Minerals (MULTIVITAMIN/EXTRA VITAMIN D3) CHEW Chew 2 tablets by mouth daily.    . norethindrone-ethinyl estradiol (FEMHRT 1/5) 1-5 MG-MCG TABS tablet Take 1 tablet by mouth daily.   12  . rosuvastatin (CRESTOR) 10 MG tablet Take 10 mg by  mouth daily.    . valACYclovir (VALTREX) 500 MG tablet Take 500 mg by mouth daily.     No current facility-administered medications on file prior to visit.     PAST MEDICAL HISTORY: Past Medical History:  Diagnosis Date  . Arthritis   . Arthritis   . Arthritis of pelvic region   . Depression   . Dry skin   . Erythema multiforme   . Fatigue   . GERD (gastroesophageal reflux disease)   . H/O psoriatic arthritis    DX  age 58---- in remission for 20 years  . History of hiatal hernia   . History of kidney stones   . Hyperlipidemia   . Joint pain   . Kidney stones   . Obesity   . Personal history of colonic polyp - adenoma 01/29/2014  . Plantar fasciitis of left foot   . PONV (postoperative nausea and vomiting)   . Postmenopausal   . Psoriasis   . Renal calculus, left   . Sigmoid diverticulosis   . Urticaria   . Vitamin D deficiency     PAST SURGICAL HISTORY: Past Surgical History:  Procedure Laterality Date  . CLOSED MANIPULATION SHOULDER    . COLONOSCOPY WITH PROPOFOL  01-25-2014  . CYSTO/  BILATERAL RETROGRADE PYELOGRAM/  BILATERAL URETEROSCOPIC LASER LITHOTRIPSY STONE EXTRACTIONS/  BILATERAL STENT PLACEMENT  02-13-2010  . CYSTOSCOPY WITH RETROGRADE PYELOGRAM, URETEROSCOPY AND STENT PLACEMENT Left 04/21/2015   Procedure: CYSTOSCOPY WITH RETROGRADE PYELOGRAM, URETEROSCOPY, STONE EXTRACTION AND STENT PLACEMENT;  Surgeon: Franchot Gallo, MD;  Location: [facility];  Service: Urology;  Laterality: Left;  . CYSTOURETHROSCOPY/  PLACEMENT LYNX SUPRAPUBIC SLING  05-24-2008  . HAND TENDON SURGERY Right 1993  . HOLMIUM LASER APPLICATION Left 12/20/9561   Procedure: HOLMIUM LASER APPLICATION;  Surgeon: Franchot Gallo, MD;  Location: [facility];  Service: Urology;  Laterality: Left;  . LAPAROSCOPIC CHOLECYSTECTOMY  01-31-2002  . LEFT URETEROSCOPIC STONE EXTRACTION/  STENT PLACEMENT  10-06-2005  . RHINOPLASTY  2007  . RHINOPLASTY    . STRABISMUS  SURGERY Left age 86  . TOTAL KNEE ARTHROPLASTY Left 10/27/2016   Procedure: TOTAL KNEE ARTHROPLASTY;  Surgeon: [name], MD;  Location: [place];  Service: Orthopedics;  Laterality: Left;  . WISDOM TOOTH EXTRACTION  age 43    SOCIAL HISTORY: Social History   Tobacco Use  . Smoking status: Never Smoker  . Smokeless tobacco: Never Used  Substance Use Topics  . Alcohol use: No  . Drug use: No    FAMILY HISTORY: Family History  Problem Relation Age of Onset  . Stomach cancer Paternal Grandmother   . Hypertension Mother   . Hyperlipidemia Mother   . Thyroid disease Mother   . Obesity Mother   . Colon cancer Neg Hx   . Esophageal cancer Neg Hx   . Rectal cancer Neg Hx   . Allergic rhinitis Neg Hx   . Asthma Neg Hx   . Eczema Neg Hx   . Urticaria Neg Hx    ROS: Review of Systems  Gastrointestinal: Negative for nausea and vomiting.  Musculoskeletal:  Negative for muscle weakness.   PHYSICAL EXAM: Blood pressure 113/73, pulse 94, temperature 97.8 F (36.6 C), temperature source Oral, height 5\' 5"  (1.651 m), weight 213 lb (96.6 kg), SpO2 93 %. Body mass index is 35.45 kg/m. Physical Exam Vitals signs reviewed.  Constitutional:      Appearance: Normal appearance. She is obese.  Cardiovascular:     Rate and Rhythm: Normal rate.     Pulses: Normal pulses.  Pulmonary:     Effort: Pulmonary effort is normal.     Breath sounds: Normal breath sounds.  Musculoskeletal: Normal range of motion.  Skin:    General: Skin is warm and dry.  Neurological:     Mental Status: She is alert and oriented to person, place, and time.  Psychiatric:        Behavior: Behavior normal.   RECENT LABS AND TESTS: BMET    Component Value Date/Time   NA 142 12/12/2018 1209   K 4.6 12/12/2018 1209   CL 105 12/12/2018 1209   CO2 23 12/12/2018 1209   GLUCOSE 101 (H) 12/12/2018 1209   GLUCOSE 129 (H) 10/28/2016 0530   BUN 13 12/12/2018 1209   CREATININE 0.69 12/12/2018 1209    CALCIUM 9.2 12/12/2018 1209   GFRNONAA 96 12/12/2018 1209   GFRAA 111 12/12/2018 1209   Lab Results  Component Value Date   HGBA1C 5.4 12/12/2018   HGBA1C 5.4 08/10/2018   Lab Results  Component Value Date   INSULIN 11.5 12/12/2018   INSULIN 10.9 08/10/2018   CBC    Component Value Date/Time   WBC 4.7 08/10/2018 1231   WBC 7.2 10/28/2016 0530   RBC 4.03 08/10/2018 1231   RBC 3.86 (L) 10/28/2016 0530   HGB 11.0 (L) 08/10/2018 1231   HCT 35.6 08/10/2018 1231   PLT 213 10/28/2016 0530   MCV 88 08/10/2018 1231   MCH 27.3 08/10/2018 1231   MCH 29.5 10/28/2016 0530   MCHC 30.9 (L) 08/10/2018 1231   MCHC 32.3 10/28/2016 0530   RDW 13.8 08/10/2018 1231   LYMPHSABS 1.7 08/10/2018 1231   EOSABS 0.1 08/10/2018 1231   BASOSABS 0.0 08/10/2018 1231   Iron/TIBC/Ferritin/ %Sat No results found for: IRON, TIBC, FERRITIN, IRONPCTSAT Lipid Panel     Component Value Date/Time   CHOL 149 12/12/2018 1209   TRIG 69 12/12/2018 1209   HDL 79 12/12/2018 1209   LDLCALC 56 12/12/2018 1209   Hepatic Function Panel     Component Value Date/Time   PROT 6.4 12/12/2018 1209   ALBUMIN 4.3 12/12/2018 1209   AST 17 12/12/2018 1209   ALT 17 12/12/2018 1209   ALKPHOS 91 12/12/2018 1209   BILITOT 0.3 12/12/2018 1209      Component Value Date/Time   TSH 1.990 08/10/2018 1231   Results for PARIS, HOHN (MRN [id number]) as of 06/05/2019 14:00  Ref. Range 12/12/2018 12:09  Vitamin D, 25-Hydroxy Latest Ref Range: 30.0 - 100.0 ng/mL 62.8   OBESITY BEHAVIORAL INTERVENTION VISIT  Today's visit was #13  Starting weight: 236 lbs Starting date: 08/10/2018 Today's weight: 213 lbs  Today's date: 06/04/2019 Total lbs lost to date: 23    06/04/2019  Height 5\' 5"  (1.651 m)  Weight 213 lb (96.6 kg)  BMI (Calculated) 35.44  BLOOD PRESSURE - SYSTOLIC 939  BLOOD PRESSURE - DIASTOLIC 73   Body Fat % 03.0 %  Total Body Water (lbs) 81.2 lbs   ASK: We discussed the diagnosis of obesity with Berneta Sages today  and Malee agreed to give [name] permission to discuss obesity behavioral modification therapy today.  ASSESS: Mena has the diagnosis of obesity and her BMI today is 35.6. Jhaniya is in the action stage of change.   ADVISE: Jenetta was educated on the multiple health risks of obesity as well as the benefit of weight loss to improve her health. She was advised of the need for long term treatment and the importance of lifestyle modifications to improve her current health and to decrease her risk of future health problems.  AGREE: Multiple dietary modification options and treatment options were discussed and  Ludmilla agreed to follow the recommendations documented in the above note.  ARRANGE: Lakeithia was educated on the importance of frequent visits to treat obesity as outlined per CMS and USPSTF guidelines and agreed to schedule her next follow up appointment today.  I, Michaelene Song, am acting as Location manager for Dennard Nip, MD I have reviewed the above documentation for accuracy and completeness, and I agree with the above. -Dennard Nip, MD

## 2019-06-18 ENCOUNTER — Ambulatory Visit (INDEPENDENT_AMBULATORY_CARE_PROVIDER_SITE_OTHER): Payer: 59 | Admitting: Family Medicine

## 2019-06-18 ENCOUNTER — Encounter (INDEPENDENT_AMBULATORY_CARE_PROVIDER_SITE_OTHER): Payer: Self-pay | Admitting: Family Medicine

## 2019-06-18 ENCOUNTER — Other Ambulatory Visit: Payer: Self-pay

## 2019-06-18 VITALS — BP 127/82 | HR 82 | Temp 97.9°F | Ht 65.0 in | Wt 215.0 lb

## 2019-06-18 DIAGNOSIS — Z13820 Encounter for screening for osteoporosis: Secondary | ICD-10-CM | POA: Diagnosis not present

## 2019-06-18 DIAGNOSIS — Z9189 Other specified personal risk factors, not elsewhere classified: Secondary | ICD-10-CM | POA: Diagnosis not present

## 2019-06-18 DIAGNOSIS — Z6836 Body mass index (BMI) 36.0-36.9, adult: Secondary | ICD-10-CM | POA: Diagnosis not present

## 2019-06-18 DIAGNOSIS — E559 Vitamin D deficiency, unspecified: Secondary | ICD-10-CM

## 2019-06-18 DIAGNOSIS — Z6835 Body mass index (BMI) 35.0-35.9, adult: Secondary | ICD-10-CM | POA: Diagnosis not present

## 2019-06-18 DIAGNOSIS — E8881 Metabolic syndrome: Secondary | ICD-10-CM

## 2019-06-18 DIAGNOSIS — Z01419 Encounter for gynecological examination (general) (routine) without abnormal findings: Secondary | ICD-10-CM | POA: Diagnosis not present

## 2019-06-18 DIAGNOSIS — Z1231 Encounter for screening mammogram for malignant neoplasm of breast: Secondary | ICD-10-CM | POA: Diagnosis not present

## 2019-06-18 MED ORDER — VITAMIN D (ERGOCALCIFEROL) 1.25 MG (50000 UNIT) PO CAPS: 50000.0000 [IU] | ORAL_CAPSULE | ORAL | 0 refills | Status: DC

## 2019-06-18 MED FILL — ROSUVASTATIN CALCIUM 10 MG: 10 | 90 days supply | Qty: 90 | Fill #1

## 2019-06-18 MED FILL — AZELAIC ACID 15 % GEL: 15 | 30 days supply | Qty: 50 | Fill #0

## 2019-06-18 MED FILL — ESOMEPRAZOLE MAG DR 40 MG C: 40 | 90 days supply | Qty: 90 | Fill #1

## 2019-06-18 MED FILL — ALPRAZolam 0.5 MG TABS: 0.5 | 30 days supply | Qty: 30 | Fill #0

## 2019-06-18 MED FILL — VIT D2 1.25 MG (50,000 UNIT: 1.25 MG | 28 days supply | Qty: 4 | Fill #0

## 2019-06-18 NOTE — Progress Notes (Signed)
Office: [phone]  /  Fax: [phone]   HPI:   Chief Complaint: OBESITY Dana Williams is here to discuss her progress with her obesity treatment plan. She is on the Category 3 plan and is following her eating plan approximately 60-65% of the time. She states she is walking 30-84minutes 3 times per week. [name] has been on vacation and did some celebration eating. She is retaining fluid today and notes she is not always eating all of her food. Her weight is 215 lb (97.5 kg) today and has had a weight gain of 2 lbs since her last visit. She has lost 21 lbs since starting treatment with [name].  Insulin Resistance Dana Williams has a diagnosis of insulin resistance based on her elevated fasting insulin level >5. Dana Williams has questions about what IR is and how to treat. We discussed this many months ago but the patient has new questions. Although Dana Williams's blood glucose readings are still under good control, insulin resistance puts her at greater risk of metabolic syndrome and diabetes. She is not taking metformin currently and continues to work on diet and exercise to decrease risk of diabetes.  At risk for diabetes Dana Williams is at higher than averagerisk for developing diabetes due to her obesity. She currently denies polyuria or polydipsia.  Vitamin D deficiency Dana Williams has a diagnosis of Vitamin D deficiency. She is currently stable on prescription Vit D and denies nausea, vomiting or muscle weakness.  ASSESSMENT AND PLAN:  Insulin resistance  Vitamin D deficiency - Plan: Vitamin D, Ergocalciferol, (DRISDOL) 1.25 MG (50000 UT) CAPS capsule  At risk for diabetes mellitus  Class 2 severe obesity with serious comorbidity and body mass index (BMI) of 35.0 to 35.9 in adult, unspecified obesity type ([place])  PLAN:  Insulin Resistance Dana Williams will continue to work on weight loss, exercise, and decreasing simple carbohydrates in her diet to help decrease the risk of diabetes. We dicussed metformin including benefits  and risks. She was informed that eating too many simple carbohydrates or too many calories at one sitting increases the likelihood of GI side effects. Williams was educated on IR and the importance of proper diet and exercise to help prevent diabetes mellitus. She will follow-up with [name] as directed to monitor her progress.  Diabetes risk counseling Dana Williams was given extended (15 minutes) diabetes prevention counseling today. She is 58 y.o. female and has risk factors for diabetes including obesity. We discussed intensive lifestyle modifications today with an emphasis on weight loss as well as increasing exercise and decreasing simple carbohydrates in her diet.  Vitamin D Deficiency Dana Williams was informed that low Vitamin D levels contributes to fatigue and are associated with obesity, breast, and colon cancer. She agrees to continue to take prescription Vit D @ 50,000 IU every week #4 with 0 refills and will follow-up for routine testing of Vitamin D, at least 2-3 times per year. She was informed of the risk of over-replacement of Vitamin D and agrees to not increase her dose unless she discusses this with [name] first. Dana Williams agrees to follow-up with our clinic in 2-3 weeks.  Obesity Dana Williams is currently in the action stage of change. As such, her goal is to continue with weight loss efforts. She has agreed to follow the Category 3 plan or change to journaling 1500 calories and 100 grams of protein. Dana Williams has been instructed to work up to a goal of 150 minutes of combined cardio and strengthening exercise per week for weight loss and overall health benefits. We  discussed the following Behavioral Modification Strategies today: increasing lean protein intake, decreasing simple carbohydrates, increasing vegetables, increase H20 intake, and increase high fiber foods.  Dana Williams has agreed to follow-up with our clinic in 2-3 weeks. She was informed of the importance of frequent follow-up visits to maximize her success with  intensive lifestyle modifications for her multiple health conditions.  ALLERGIES: Allergies  Allergen Reactions  . Sulfa Antibiotics Other (See Comments)    Mouth ulcers    MEDICATIONS: Current Outpatient Medications on File Prior to Visit  Medication Sig Dispense Refill  . ammonium lactate (LAC-HYDRIN) 12 % lotion Apply 1 application topically daily.     . Azelaic Acid (FINACEA) 15 % cream Apply 1 application topically daily.     . calcipotriene-betamethasone (TACLONEX) external suspension Apply 1 application topically daily as needed.     [name] esomeprazole (NEXIUM) 40 MG capsule Take 40 mg by mouth daily before breakfast.    . Multiple Vitamins-Minerals (MULTIVITAMIN/EXTRA VITAMIN D3) CHEW Chew 2 tablets by mouth daily.    . norethindrone-ethinyl estradiol (FEMHRT 1/5) 1-5 MG-MCG TABS tablet Take 1 tablet by mouth daily.   12  . rosuvastatin (CRESTOR) 10 MG tablet Take 10 mg by mouth daily.    . valACYclovir (VALTREX) 500 MG tablet Take 500 mg by mouth daily.    . Vitamin D, Ergocalciferol, (DRISDOL) 1.25 MG (50000 UT) CAPS capsule Take 1 capsule (50,000 Units total) by mouth every 7 (seven) days. 4 capsule 0   No current facility-administered medications on file prior to visit.     PAST MEDICAL HISTORY: Past Medical History:  Diagnosis Date  . Arthritis   . Arthritis   . Arthritis of pelvic region   . Depression   . Dry skin   . Erythema multiforme   . Fatigue   . GERD (gastroesophageal reflux disease)   . H/O psoriatic arthritis    DX  age 26---- in remission for 20 years  . History of hiatal hernia   . History of kidney stones   . Hyperlipidemia   . Joint pain   . Kidney stones   . Obesity   . Personal history of colonic polyp - adenoma 01/29/2014  . Plantar fasciitis of left foot   . PONV (postoperative nausea and vomiting)   . Postmenopausal   . Psoriasis   . Renal calculus, left   . Sigmoid diverticulosis   . Urticaria   . Vitamin D deficiency     PAST  SURGICAL HISTORY: Past Surgical History:  Procedure Laterality Date  . CLOSED MANIPULATION SHOULDER    . COLONOSCOPY WITH PROPOFOL  01-25-2014  . CYSTO/  BILATERAL RETROGRADE PYELOGRAM/  BILATERAL URETEROSCOPIC LASER LITHOTRIPSY STONE EXTRACTIONS/  BILATERAL STENT PLACEMENT  02-13-2010  . CYSTOSCOPY WITH RETROGRADE PYELOGRAM, URETEROSCOPY AND STENT PLACEMENT Left 04/21/2015   Procedure: CYSTOSCOPY WITH RETROGRADE PYELOGRAM, URETEROSCOPY, STONE EXTRACTION AND STENT PLACEMENT;  Surgeon: Franchot Gallo, MD;  Location: [facility];  Service: Urology;  Laterality: Left;  . CYSTOURETHROSCOPY/  PLACEMENT LYNX SUPRAPUBIC SLING  05-24-2008  . HAND TENDON SURGERY Right 1993  . HOLMIUM LASER APPLICATION Left 0/05/3709   Procedure: HOLMIUM LASER APPLICATION;  Surgeon: Franchot Gallo, MD;  Location: [facility];  Service: Urology;  Laterality: Left;  . LAPAROSCOPIC CHOLECYSTECTOMY  01-31-2002  . LEFT URETEROSCOPIC STONE EXTRACTION/  STENT PLACEMENT  10-06-2005  . RHINOPLASTY  2007  . RHINOPLASTY    . STRABISMUS SURGERY Left age 74  . TOTAL KNEE ARTHROPLASTY Left 10/27/2016  Procedure: TOTAL KNEE ARTHROPLASTY;  Surgeon: [name], MD;  Location: [place];  Service: Orthopedics;  Laterality: Left;  . WISDOM TOOTH EXTRACTION  age 9    SOCIAL HISTORY: Social History   Tobacco Use  . Smoking status: Never Smoker  . Smokeless tobacco: Never Used  Substance Use Topics  . Alcohol use: No  . Drug use: No    FAMILY HISTORY: Family History  Problem Relation Age of Onset  . Stomach cancer Paternal Grandmother   . Hypertension Mother   . Hyperlipidemia Mother   . Thyroid disease Mother   . Obesity Mother   . Colon cancer Neg Hx   . Esophageal cancer Neg Hx   . Rectal cancer Neg Hx   . Allergic rhinitis Neg Hx   . Asthma Neg Hx   . Eczema Neg Hx   . Urticaria Neg Hx    ROS: Review of Systems  Gastrointestinal: Negative for nausea and vomiting.   Musculoskeletal:       Negative for muscle weakness.   PHYSICAL EXAM: Blood pressure 127/82, pulse 82, temperature 97.9 F (36.6 C), temperature source Oral, height 5\' 5"  (1.651 m), weight 215 lb (97.5 kg), SpO2 97 %. Body mass index is 35.78 kg/m. Physical Exam Vitals signs reviewed.  Constitutional:      Appearance: Normal appearance. She is obese.  Cardiovascular:     Rate and Rhythm: Normal rate.     Pulses: Normal pulses.  Pulmonary:     Effort: Pulmonary effort is normal.     Breath sounds: Normal breath sounds.  Musculoskeletal: Normal range of motion.  Skin:    General: Skin is warm and dry.  Neurological:     Mental Status: She is alert and oriented to person, place, and time.  Psychiatric:        Behavior: Behavior normal.   RECENT LABS AND TESTS: BMET    Component Value Date/Time   NA 142 12/12/2018 1209   K 4.6 12/12/2018 1209   CL 105 12/12/2018 1209   CO2 23 12/12/2018 1209   GLUCOSE 101 (H) 12/12/2018 1209   GLUCOSE 129 (H) 10/28/2016 0530   BUN 13 12/12/2018 1209   CREATININE 0.69 12/12/2018 1209   CALCIUM 9.2 12/12/2018 1209   GFRNONAA 96 12/12/2018 1209   GFRAA 111 12/12/2018 1209   Lab Results  Component Value Date   HGBA1C 5.4 12/12/2018   HGBA1C 5.4 08/10/2018   Lab Results  Component Value Date   INSULIN 11.5 12/12/2018   INSULIN 10.9 08/10/2018   CBC    Component Value Date/Time   WBC 4.7 08/10/2018 1231   WBC 7.2 10/28/2016 0530   RBC 4.03 08/10/2018 1231   RBC 3.86 (L) 10/28/2016 0530   HGB 11.0 (L) 08/10/2018 1231   HCT 35.6 08/10/2018 1231   PLT 213 10/28/2016 0530   MCV 88 08/10/2018 1231   MCH 27.3 08/10/2018 1231   MCH 29.5 10/28/2016 0530   MCHC 30.9 (L) 08/10/2018 1231   MCHC 32.3 10/28/2016 0530   RDW 13.8 08/10/2018 1231   LYMPHSABS 1.7 08/10/2018 1231   EOSABS 0.1 08/10/2018 1231   BASOSABS 0.0 08/10/2018 1231   Iron/TIBC/Ferritin/ %Sat No results found for: IRON, TIBC, FERRITIN, IRONPCTSAT Lipid Panel      Component Value Date/Time   CHOL 149 12/12/2018 1209   TRIG 69 12/12/2018 1209   HDL 79 12/12/2018 1209   LDLCALC 56 12/12/2018 1209   Hepatic Function Panel     Component Value Date/Time  PROT 6.4 12/12/2018 1209   ALBUMIN 4.3 12/12/2018 1209   AST 17 12/12/2018 1209   ALT 17 12/12/2018 1209   ALKPHOS 91 12/12/2018 1209   BILITOT 0.3 12/12/2018 1209      Component Value Date/Time   TSH 1.990 08/10/2018 1231   Results for SAKSHI, SERMONS (MRN [id number]) as of 06/18/2019 16:26  Ref. Range 12/12/2018 12:09  Vitamin D, 25-Hydroxy Latest Ref Range: 30.0 - 100.0 ng/mL 62.8   OBESITY BEHAVIORAL INTERVENTION VISIT  Today's visit was #14  Starting weight: 236 lbs Starting date: 08/10/2018 Today's weight: 215 lbs Today's date: 06/18/2019 Total lbs lost to date: 21    06/18/2019  Height 5\' 5"  (1.651 m)  Weight 215 lb (97.5 kg)  BMI (Calculated) 35.78  BLOOD PRESSURE - SYSTOLIC 829  BLOOD PRESSURE - DIASTOLIC 82   Body Fat % 56.2 %  Total Body Water (lbs) 84.8 lbs   ASK: We discussed the diagnosis of obesity with Berneta Sages today and Kilynn agreed to give [name] permission to discuss obesity behavioral modification therapy today.  ASSESS: Annalyse has the diagnosis of obesity and her BMI today is 35.8. Reece is in the action stage of change.   ADVISE: Sondra was educated on the multiple health risks of obesity as well as the benefit of weight loss to improve her health. She was advised of the need for long term treatment and the importance of lifestyle modifications to improve her current health and to decrease her risk of future health problems.  AGREE: Multiple dietary modification options and treatment options were discussed and  Asherah agreed to follow the recommendations documented in the above note.  ARRANGE: Glenice was educated on the importance of frequent visits to treat obesity as outlined per CMS and USPSTF guidelines and agreed to schedule her next follow up appointment  today.  I, Michaelene Song, am acting as Location manager for Dennard Nip, MD I have reviewed the above documentation for accuracy and completeness, and I agree with the above. -Dennard Nip, MD

## 2019-06-21 MED FILL — VALACYCLOVIR HCL 500 MG TAB: 500 | 90 days supply | Qty: 90 | Fill #0

## 2019-07-03 ENCOUNTER — Other Ambulatory Visit: Payer: Self-pay

## 2019-07-03 ENCOUNTER — Ambulatory Visit (INDEPENDENT_AMBULATORY_CARE_PROVIDER_SITE_OTHER): Payer: 59 | Admitting: Physician Assistant

## 2019-07-03 VITALS — BP 115/74 | HR 97 | Temp 98.2°F | Ht 65.0 in | Wt 212.0 lb

## 2019-07-03 DIAGNOSIS — E7849 Other hyperlipidemia: Secondary | ICD-10-CM | POA: Diagnosis not present

## 2019-07-03 DIAGNOSIS — Z6835 Body mass index (BMI) 35.0-35.9, adult: Secondary | ICD-10-CM

## 2019-07-03 DIAGNOSIS — Z9189 Other specified personal risk factors, not elsewhere classified: Secondary | ICD-10-CM

## 2019-07-03 DIAGNOSIS — E559 Vitamin D deficiency, unspecified: Secondary | ICD-10-CM

## 2019-07-03 MED ORDER — VITAMIN D (ERGOCALCIFEROL) 1.25 MG (50000 UNIT) PO CAPS: 50000.0000 [IU] | ORAL_CAPSULE | ORAL | 0 refills | Status: DC

## 2019-07-04 NOTE — Progress Notes (Signed)
Office: [phone]  /  Fax: [phone]   HPI:   Chief Complaint: OBESITY Dana Williams is here to discuss her progress with her obesity treatment plan. She is on the Category 3 plan and is following her eating plan approximately 70% of the time. She states she is exercising 0 minutes 0 times per week. Dana Williams reports that she is having a difficult time getting in all of the protein on the plan. She has not decided whether or not she wants to journal or stay on Category 3. Her weight is 212 lb (96.2 kg) today and has had a weight loss of 3 pounds over a period of 2 weeks since her last visit. She has lost 24 lbs since starting treatment with [name].  Vitamin D deficiency Dana Williams has a diagnosis of Vitamin D deficiency. She is currently taking prescription Vit D and denies nausea, vomiting or muscle weakness.  Hyperlipidemia Dana Williams has hyperlipidemia and has been trying to improve her cholesterol levels with intensive lifestyle modification including a low saturated fat diet, exercise and weight loss. She is on Crestor and denies any chest pain.   At risk for cardiovascular disease Dana Williams is at a higher than average risk for cardiovascular disease due to obesity. She currently denies any chest pain.  ASSESSMENT AND PLAN:  Vitamin D deficiency - Plan: Vitamin D, Ergocalciferol, (DRISDOL) 1.25 MG (50000 UT) CAPS capsule  Other hyperlipidemia  At risk for heart disease  Class 2 severe obesity with serious comorbidity and body mass index (BMI) of 35.0 to 35.9 in adult, unspecified obesity type (Dana Williams)  PLAN:  Vitamin D Deficiency Dana Williams was informed that low Vitamin D levels contributes to fatigue and are associated with obesity, breast, and colon cancer. She agrees to continue to take prescription Vit D @ 50,000 IU every week #4 with 0 refills and will follow-up for routine testing of Vitamin D, at least 2-3 times per year. She was informed of the risk of over-replacement of Vitamin D and agrees to not  increase her dose unless she discusses this with [name] first. Thereasa agrees to follow-up with our clinic in 2-3 weeks.  Hyperlipidemia Dana Williams was informed of the American Heart Association Guidelines emphasizing intensive lifestyle modifications as the first line treatment for hyperlipidemia. We discussed many lifestyle modifications today in depth, and Dana Williams will continue to work on decreasing saturated fats such as fatty red meat, butter and many fried foods. She will continue Crestor, increase vegetables and lean protein in her diet, and continue to work on exercise and weight loss efforts.  Cardiovascular risk counseling Dana Williams was given extended (15 minutes) coronary artery disease prevention counseling today. She is 58 y.o. female and has risk factors for heart disease including obesity. We discussed intensive lifestyle modifications today with an emphasis on specific weight loss instructions and strategies. Pt was also informed of the importance of increasing exercise and decreasing saturated fats to help prevent heart disease.  Obesity Dana Williams is currently in the action stage of change. As such, her goal is to continue with weight loss efforts. She has agreed to follow the Category 3 plan. Dana Williams has been instructed to work up to a goal of 150 minutes of combined cardio and strengthening exercise per week for weight loss and overall health benefits. We discussed the following Behavioral Modification Strategies today: increasing lean protein intake, work on meal planning and easy cooking plans.  Dana Williams has agreed to follow-up with our clinic in 2-3 weeks. She was informed of the importance of  frequent follow-up visits to maximize her success with intensive lifestyle modifications for her multiple health conditions.  ALLERGIES: Allergies  Allergen Reactions   Sulfa Antibiotics Other (See Comments)    Mouth ulcers    MEDICATIONS: Current Outpatient Medications on File Prior to Visit  Medication  Sig Dispense Refill   ammonium lactate (LAC-HYDRIN) 12 % lotion Apply 1 application topically daily.      Azelaic Acid (FINACEA) 15 % cream Apply 1 application topically daily.      calcipotriene-betamethasone (TACLONEX) external suspension Apply 1 application topically daily as needed.      esomeprazole (NEXIUM) 40 MG capsule Take 40 mg by mouth daily before breakfast.     Multiple Vitamins-Minerals (MULTIVITAMIN/EXTRA VITAMIN D3) CHEW Chew 2 tablets by mouth daily.     norethindrone-ethinyl estradiol (FEMHRT 1/5) 1-5 MG-MCG TABS tablet Take 1 tablet by mouth daily.   12   rosuvastatin (CRESTOR) 10 MG tablet Take 10 mg by mouth daily.     valACYclovir (VALTREX) 500 MG tablet Take 500 mg by mouth daily.     No current facility-administered medications on file prior to visit.     PAST MEDICAL HISTORY: Past Medical History:  Diagnosis Date   Arthritis    Arthritis    Arthritis of pelvic region    Depression    Dry skin    Erythema multiforme    Fatigue    GERD (gastroesophageal reflux disease)    H/O psoriatic arthritis    DX  age 42---- in remission for 20 years   History of hiatal hernia    History of kidney stones    Hyperlipidemia    Joint pain    Kidney stones    Obesity    Personal history of colonic polyp - adenoma 01/29/2014   Plantar fasciitis of left foot    PONV (postoperative nausea and vomiting)    Postmenopausal    Psoriasis    Renal calculus, left    Sigmoid diverticulosis    Urticaria    Vitamin D deficiency     PAST SURGICAL HISTORY: Past Surgical History:  Procedure Laterality Date   CLOSED MANIPULATION SHOULDER     COLONOSCOPY WITH PROPOFOL  01-25-2014   CYSTO/  BILATERAL RETROGRADE PYELOGRAM/  BILATERAL URETEROSCOPIC LASER LITHOTRIPSY STONE EXTRACTIONS/  BILATERAL STENT PLACEMENT  02-13-2010   CYSTOSCOPY WITH RETROGRADE PYELOGRAM, URETEROSCOPY AND STENT PLACEMENT Left 04/21/2015   Procedure: CYSTOSCOPY WITH  RETROGRADE PYELOGRAM, URETEROSCOPY, STONE EXTRACTION AND STENT PLACEMENT;  Surgeon: Franchot Gallo, MD;  Location: [facility];  Service: Urology;  Laterality: Left;   CYSTOURETHROSCOPY/  PLACEMENT LYNX SUPRAPUBIC SLING  05-24-2008   HAND TENDON SURGERY Right 1993   HOLMIUM LASER APPLICATION Left 01/15/8249   Procedure: HOLMIUM LASER APPLICATION;  Surgeon: Franchot Gallo, MD;  Location: [facility];  Service: Urology;  Laterality: Left;   LAPAROSCOPIC CHOLECYSTECTOMY  01-31-2002   LEFT URETEROSCOPIC STONE EXTRACTION/  STENT PLACEMENT  10-06-2005   RHINOPLASTY  2007   RHINOPLASTY     STRABISMUS SURGERY Left age 89   TOTAL KNEE ARTHROPLASTY Left 10/27/2016   Procedure: TOTAL KNEE ARTHROPLASTY;  Surgeon: [name], MD;  Location: [place];  Service: Orthopedics;  Laterality: Left;   WISDOM TOOTH EXTRACTION  age 37    SOCIAL HISTORY: Social History   Tobacco Use   Smoking status: Never Smoker   Smokeless tobacco: Never Used  Substance Use Topics   Alcohol use: No   Drug use: No    FAMILY HISTORY:  Family History  Problem Relation Age of Onset   Stomach cancer Paternal Grandmother    Hypertension Mother    Hyperlipidemia Mother    Thyroid disease Mother    Obesity Mother    Colon cancer Neg Hx    Esophageal cancer Neg Hx    Rectal cancer Neg Hx    Allergic rhinitis Neg Hx    Asthma Neg Hx    Eczema Neg Hx    Urticaria Neg Hx    ROS: Review of Systems  Cardiovascular: Negative for chest pain.  Gastrointestinal: Negative for nausea and vomiting.  Musculoskeletal:       Negative for muscle weakness.   PHYSICAL EXAM: Blood pressure 115/74, pulse 97, temperature 98.2 F (36.8 C), height 5\' 5"  (1.651 m), weight 212 lb (96.2 kg), SpO2 98 %. Body mass index is 35.28 kg/m. Physical Exam Vitals signs reviewed.  Constitutional:      Appearance: Normal appearance. She is obese.  Cardiovascular:     Rate and Rhythm:  Normal rate.     Pulses: Normal pulses.  Pulmonary:     Effort: Pulmonary effort is normal.     Breath sounds: Normal breath sounds.  Musculoskeletal: Normal range of motion.  Skin:    General: Skin is warm and dry.  Neurological:     Mental Status: She is alert and oriented to person, place, and time.  Psychiatric:        Behavior: Behavior normal.   RECENT LABS AND TESTS: BMET    Component Value Date/Time   NA 142 12/12/2018 1209   K 4.6 12/12/2018 1209   CL 105 12/12/2018 1209   CO2 23 12/12/2018 1209   GLUCOSE 101 (H) 12/12/2018 1209   GLUCOSE 129 (H) 10/28/2016 0530   BUN 13 12/12/2018 1209   CREATININE 0.69 12/12/2018 1209   CALCIUM 9.2 12/12/2018 1209   GFRNONAA 96 12/12/2018 1209   GFRAA 111 12/12/2018 1209   Lab Results  Component Value Date   HGBA1C 5.4 12/12/2018   HGBA1C 5.4 08/10/2018   Lab Results  Component Value Date   INSULIN 11.5 12/12/2018   INSULIN 10.9 08/10/2018   CBC    Component Value Date/Time   WBC 4.7 08/10/2018 1231   WBC 7.2 10/28/2016 0530   RBC 4.03 08/10/2018 1231   RBC 3.86 (L) 10/28/2016 0530   HGB 11.0 (L) 08/10/2018 1231   HCT 35.6 08/10/2018 1231   PLT 213 10/28/2016 0530   MCV 88 08/10/2018 1231   MCH 27.3 08/10/2018 1231   MCH 29.5 10/28/2016 0530   MCHC 30.9 (L) 08/10/2018 1231   MCHC 32.3 10/28/2016 0530   RDW 13.8 08/10/2018 1231   LYMPHSABS 1.7 08/10/2018 1231   EOSABS 0.1 08/10/2018 1231   BASOSABS 0.0 08/10/2018 1231   Iron/TIBC/Ferritin/ %Sat No results found for: IRON, TIBC, FERRITIN, IRONPCTSAT Lipid Panel     Component Value Date/Time   CHOL 149 12/12/2018 1209   TRIG 69 12/12/2018 1209   HDL 79 12/12/2018 1209   LDLCALC 56 12/12/2018 1209   Hepatic Function Panel     Component Value Date/Time   PROT 6.4 12/12/2018 1209   ALBUMIN 4.3 12/12/2018 1209   AST 17 12/12/2018 1209   ALT 17 12/12/2018 1209   ALKPHOS 91 12/12/2018 1209   BILITOT 0.3 12/12/2018 1209      Component Value Date/Time    TSH 1.990 08/10/2018 1231   Results for GRACELYNNE, BENEDICT (MRN [id number]) as of 07/04/2019 16:19  Ref. Range 12/12/2018  12:09  Vitamin D, 25-Hydroxy Latest Ref Range: 30.0 - 100.0 ng/mL 62.8   OBESITY BEHAVIORAL INTERVENTION VISIT  Today's visit was #15  Starting weight: 236 lbs Starting date: 08/10/2018 Today's weight: 212 lbs  Today's date: 07/03/2019 Total lbs lost to date: 24    07/03/2019  Height 5\' 5"  (1.651 m)  Weight 212 lb (96.2 kg)  BMI (Calculated) 35.28  BLOOD PRESSURE - SYSTOLIC 153  BLOOD PRESSURE - DIASTOLIC 74   Body Fat % 79.4 %  Total Body Water (lbs) 83.01 lbs   ASK: We discussed the diagnosis of obesity with Dana Williams today and Dana Williams agreed to give [name] permission to discuss obesity behavioral modification therapy today.  ASSESS: Daniyla has the diagnosis of obesity and her BMI today is 35.4. Dana Williams is in the action stage of change.   ADVISE: Dana Williams was educated on the multiple health risks of obesity as well as the benefit of weight loss to improve her health. She was advised of the need for long term treatment and the importance of lifestyle modifications to improve her current health and to decrease her risk of future health problems.  AGREE: Multiple dietary modification options and treatment options were discussed and  Dana Williams agreed to follow the recommendations documented in the above note.  ARRANGE: Dana Williams was educated on the importance of frequent visits to treat obesity as outlined per CMS and USPSTF guidelines and agreed to schedule her next follow up appointment today.  Dana Williams Dk, am acting as transcriptionist for Abby Potash, PA-CI, Abby Potash, PA-C have reviewed above note and agree with its content

## 2019-07-12 DIAGNOSIS — G479 Sleep disorder, unspecified: Secondary | ICD-10-CM | POA: Diagnosis not present

## 2019-07-12 DIAGNOSIS — F329 Major depressive disorder, single episode, unspecified: Secondary | ICD-10-CM | POA: Diagnosis not present

## 2019-07-12 DIAGNOSIS — F411 Generalized anxiety disorder: Secondary | ICD-10-CM | POA: Diagnosis not present

## 2019-07-12 MED FILL — SERTRALINE HCL 25 MG TABLET: 25 | 30 days supply | Qty: 30 | Fill #0

## 2019-07-12 MED FILL — ESTRADIOL-NORETHINDRONE ACE: 1-0.5 | 84 days supply | Qty: 84 | Fill #1

## 2019-07-12 MED FILL — hydrOXYzine HCL 10 MG TABS: 10 | 30 days supply | Qty: 30 | Fill #0

## 2019-07-12 MED FILL — VIT D2 1.25 MG (50,000 UNIT: 1.25 MG | 28 days supply | Qty: 4 | Fill #0

## 2019-07-27 MED FILL — hydrOXYzine HCL 10 MG TABS: 10 | 90 days supply | Qty: 180 | Fill #0

## 2019-07-27 MED FILL — SERTRALINE HCL 50 MG TABLET: 50 | 90 days supply | Qty: 90 | Fill #0

## 2019-08-06 ENCOUNTER — Ambulatory Visit (INDEPENDENT_AMBULATORY_CARE_PROVIDER_SITE_OTHER): Payer: 59 | Admitting: Family Medicine

## 2019-08-18 MED FILL — AMMONIUM LACTATE 12% LOTION: 12 | 30 days supply | Qty: 800 | Fill #0

## 2019-08-18 MED FILL — VIT D2 1.25 MG (50,000 UNIT: 1.25 MG | 28 days supply | Qty: 4 | Fill #0

## 2019-09-11 DIAGNOSIS — H5213 Myopia, bilateral: Secondary | ICD-10-CM | POA: Diagnosis not present

## 2019-09-11 DIAGNOSIS — H524 Presbyopia: Secondary | ICD-10-CM | POA: Diagnosis not present

## 2019-09-11 DIAGNOSIS — H52223 Regular astigmatism, bilateral: Secondary | ICD-10-CM | POA: Diagnosis not present

## 2019-09-21 MED FILL — DOXYCYCLINE HYCLATE 100 MG: 100 | 15 days supply | Qty: 30 | Fill #1

## 2019-09-21 MED FILL — ESOMEPRAZOLE MAG DR 40 MG C: 40 | 90 days supply | Qty: 90 | Fill #0

## 2019-09-21 MED FILL — VALACYCLOVIR HCL 500 MG TAB: 500 | 90 days supply | Qty: 90 | Fill #0

## 2019-09-21 MED FILL — AMMONIUM LACTATE 12% LOTION: 12 | 30 days supply | Qty: 800 | Fill #1

## 2019-09-21 MED FILL — ROSUVASTATIN CALCIUM 10 MG: 10 | 90 days supply | Qty: 90 | Fill #0

## 2019-09-25 ENCOUNTER — Ambulatory Visit (INDEPENDENT_AMBULATORY_CARE_PROVIDER_SITE_OTHER): Payer: 59 | Admitting: Family Medicine

## 2019-09-25 ENCOUNTER — Other Ambulatory Visit: Payer: Self-pay

## 2019-09-25 ENCOUNTER — Encounter (INDEPENDENT_AMBULATORY_CARE_PROVIDER_SITE_OTHER): Payer: Self-pay | Admitting: Family Medicine

## 2019-09-25 VITALS — BP 118/74 | HR 99 | Temp 98.4°F | Ht 65.0 in | Wt 219.0 lb

## 2019-09-25 DIAGNOSIS — Z6836 Body mass index (BMI) 36.0-36.9, adult: Secondary | ICD-10-CM

## 2019-09-25 DIAGNOSIS — E782 Mixed hyperlipidemia: Secondary | ICD-10-CM | POA: Diagnosis not present

## 2019-09-25 DIAGNOSIS — Z9189 Other specified personal risk factors, not elsewhere classified: Secondary | ICD-10-CM

## 2019-09-25 DIAGNOSIS — E8881 Metabolic syndrome: Secondary | ICD-10-CM | POA: Diagnosis not present

## 2019-09-25 DIAGNOSIS — E559 Vitamin D deficiency, unspecified: Secondary | ICD-10-CM | POA: Diagnosis not present

## 2019-09-25 MED ORDER — VITAMIN D (ERGOCALCIFEROL) 1.25 MG (50000 UNIT) PO CAPS: 50000.0000 [IU] | ORAL_CAPSULE | ORAL | 0 refills | Status: DC

## 2019-09-25 MED FILL — VIT D2 1.25 MG (50,000 UNIT: 1.25 MG | 28 days supply | Qty: 4 | Fill #0

## 2019-09-25 NOTE — Progress Notes (Signed)
Office: [phone]  /  Fax: [phone]   HPI:   Chief Complaint: OBESITY Dana Williams is here to discuss her progress with her obesity treatment plan. She is on the Category 3 plan and is following her eating plan approximately 30-40% of the time. She states she is exercising 0 minutes 0 times per week. Dana Williams's last visit with [name] was 3 months ago and she has gained 7 lbs during that time. She has been overwhelmed with work and family stress and has gotten off track, especially with meal planning. She wants to get back on track. Her weight is 219 lb (99.3 kg) today and has had a weight gain of 7 lbs since her last visit. She has lost 17 lbs since starting treatment with [name].  Insulin Resistance Dana Williams has a diagnosis of insulin resistance based on her elevated fasting insulin level >5. Although Dana Williams's blood glucose readings are still under good control, insulin resistance puts her at greater risk of metabolic syndrome and diabetes. She is working on her diet, but struggling more this fall. She is due to have labs checked.  At risk for diabetes Dana Williams is at higher than average risk for developing diabetes due to her obesity. She currently denies polyuria or polydipsia.  Hyperlipidemia, Mixed Dana Williams has hyperlipidemia and has been trying to improve her cholesterol levels with intensive lifestyle modification including a low saturated fat diet, exercise and weight loss. She denies any chest pain, claudication or myalgias. She is due to have labs checked.  Vitamin D deficiency Dana Williams has a diagnosis of Vitamin D deficiency. She is currently taking prescription Vit D and denies nausea, vomiting or muscle weakness.  ASSESSMENT AND PLAN:  Insulin resistance - Plan: Comprehensive Metabolic Panel (CMET), CBC w/Diff/Platelet, HgB A1c, Insulin, random  Mixed hyperlipidemia - Plan: Lipid Panel With LDL/HDL Ratio  Vitamin D deficiency - Plan: Vitamin D (25 hydroxy), Vitamin D, Ergocalciferol, (DRISDOL)  1.25 MG (50000 UT) CAPS capsule  At risk for diabetes mellitus  Class 2 severe obesity with serious comorbidity and body mass index (BMI) of 36.0 to 36.9 in adult, unspecified obesity type (Dana Williams)  PLAN:  Insulin Resistance Dana Williams will continue to work on weight loss, exercise, and decreasing simple carbohydrates in her diet to help decrease the risk of diabetes. We dicussed metformin including benefits and risks. She was informed that eating too many simple carbohydrates or too many calories at one sitting increases the likelihood of GI side effects. Dana Williams will have labs checked and follow-up as directed.  Diabetes risk counseling Dana Williams was given extended (30 minutes) diabetes prevention counseling today. She is 58 y.o. female and has risk factors for diabetes including obesity. We discussed intensive lifestyle modifications today with an emphasis on weight loss as well as increasing exercise and decreasing simple carbohydrates in her diet.  Hyperlipidemia, Mixed Dana Williams was informed of the American Heart Association Guidelines emphasizing intensive lifestyle modifications as the first line treatment for hyperlipidemia. We discussed many lifestyle modifications today in depth, and Dana Williams will continue to work on decreasing saturated fats such as fatty red meat, butter and many fried foods. Dana Williams will have labs checked and continue her diet. She will also increase vegetables and lean protein in her diet and continue to work on exercise and weight loss efforts.  Vitamin D Deficiency Dana Williams was informed that low Vitamin D levels contributes to fatigue and are associated with obesity, breast, and colon cancer. She agrees to continue to take prescription Vit D @ 50,000 IU every week #  4 with 0 refills and will have routine testing of Vitamin D. She was informed of the risk of over-replacement of Vitamin D and agrees to not increase her dose unless she discusses this with [name] first. Dana Williams agrees to follow-up  with our clinic in 3-4 weeks.  Obesity Dana Williams is currently in the action stage of change. As such, her goal is to continue with weight loss efforts. She has agreed to change plans and will now keep a food journal with 1500-1700 calories and 90+ grams of protein.  Dana Williams has been instructed to work up to a goal of 150 minutes of combined cardio and strengthening exercise per week for weight loss and overall health benefits. We discussed the following Behavioral Modification Strategies today: work on meal planning and easy cooking plans.  Dana Williams has agreed to follow-up with our clinic in 3-4 weeks. She was informed of the importance of frequent follow-up visits to maximize her success with intensive lifestyle modifications for her multiple health conditions.  ALLERGIES: Allergies  Allergen Reactions  . Sulfa Antibiotics Other (See Comments)    Mouth ulcers    MEDICATIONS: Current Outpatient Medications on File Prior to Visit  Medication Sig Dispense Refill  . ammonium lactate (LAC-HYDRIN) 12 % lotion Apply 1 application topically daily.     . Azelaic Acid (FINACEA) 15 % cream Apply 1 application topically daily.     . calcipotriene-betamethasone (TACLONEX) external suspension Apply 1 application topically daily as needed.     [name] esomeprazole (NEXIUM) 40 MG capsule Take 40 mg by mouth daily before breakfast.    . Multiple Vitamins-Minerals (MULTIVITAMIN/EXTRA VITAMIN D3) CHEW Chew 2 tablets by mouth daily.    . norethindrone-ethinyl estradiol (FEMHRT 1/5) 1-5 MG-MCG TABS tablet Take 1 tablet by mouth daily.   12  . rosuvastatin (CRESTOR) 10 MG tablet Take 10 mg by mouth daily.    . valACYclovir (VALTREX) 500 MG tablet Take 500 mg by mouth daily.     No current facility-administered medications on file prior to visit.     PAST MEDICAL HISTORY: Past Medical History:  Diagnosis Date  . Arthritis   . Arthritis   . Arthritis of pelvic region   . Depression   . Dry skin   . Erythema  multiforme   . Fatigue   . GERD (gastroesophageal reflux disease)   . H/O psoriatic arthritis    DX  age 37---- in remission for 20 years  . History of hiatal hernia   . History of kidney stones   . Hyperlipidemia   . Joint pain   . Kidney stones   . Obesity   . Personal history of colonic polyp - adenoma 01/29/2014  . Plantar fasciitis of left foot   . PONV (postoperative nausea and vomiting)   . Postmenopausal   . Psoriasis   . Renal calculus, left   . Sigmoid diverticulosis   . Urticaria   . Vitamin D deficiency     PAST SURGICAL HISTORY: Past Surgical History:  Procedure Laterality Date  . CLOSED MANIPULATION SHOULDER    . COLONOSCOPY WITH PROPOFOL  01-25-2014  . CYSTO/  BILATERAL RETROGRADE PYELOGRAM/  BILATERAL URETEROSCOPIC LASER LITHOTRIPSY STONE EXTRACTIONS/  BILATERAL STENT PLACEMENT  02-13-2010  . CYSTOSCOPY WITH RETROGRADE PYELOGRAM, URETEROSCOPY AND STENT PLACEMENT Left 04/21/2015   Procedure: CYSTOSCOPY WITH RETROGRADE PYELOGRAM, URETEROSCOPY, STONE EXTRACTION AND STENT PLACEMENT;  Surgeon: Franchot Gallo, MD;  Location: [facility];  Service: Urology;  Laterality: Left;  . CYSTOURETHROSCOPY/  PLACEMENT LYNX  SUPRAPUBIC SLING  05-24-2008  . HAND TENDON SURGERY Right 1993  . HOLMIUM LASER APPLICATION Left [id number]   Procedure: HOLMIUM LASER APPLICATION;  Surgeon: Franchot Gallo, MD;  Location: [facility];  Service: Urology;  Laterality: Left;  . LAPAROSCOPIC CHOLECYSTECTOMY  01-31-2002  . LEFT URETEROSCOPIC STONE EXTRACTION/  STENT PLACEMENT  10-06-2005  . RHINOPLASTY  2007  . RHINOPLASTY    . STRABISMUS SURGERY Left age 68  . TOTAL KNEE ARTHROPLASTY Left 10/27/2016   Procedure: TOTAL KNEE ARTHROPLASTY;  Surgeon: [name], MD;  Location: [place];  Service: Orthopedics;  Laterality: Left;  . WISDOM TOOTH EXTRACTION  age 89    SOCIAL HISTORY: Social History   Tobacco Use  . Smoking status: Never Smoker  . Smokeless  tobacco: Never Used  Substance Use Topics  . Alcohol use: No  . Drug use: No    FAMILY HISTORY: Family History  Problem Relation Age of Onset  . Stomach cancer Paternal Grandmother   . Hypertension Mother   . Hyperlipidemia Mother   . Thyroid disease Mother   . Obesity Mother   . Colon cancer Neg Hx   . Esophageal cancer Neg Hx   . Rectal cancer Neg Hx   . Allergic rhinitis Neg Hx   . Asthma Neg Hx   . Eczema Neg Hx   . Urticaria Neg Hx    ROS: Review of Systems  Cardiovascular: Negative for chest pain and claudication.  Gastrointestinal: Negative for nausea and vomiting.  Musculoskeletal: Negative for myalgias.       Negative for muscle weakness.   PHYSICAL EXAM: Blood pressure 118/74, pulse 99, temperature 98.4 F (36.9 C), temperature source Oral, height 5\' 5"  (1.651 m), weight 219 lb (99.3 kg), SpO2 96 %. Body mass index is 36.44 kg/m. Physical Exam Vitals signs reviewed.  Constitutional:      Appearance: Normal appearance. She is obese.  Cardiovascular:     Rate and Rhythm: Normal rate.     Pulses: Normal pulses.  Pulmonary:     Effort: Pulmonary effort is normal.     Breath sounds: Normal breath sounds.  Musculoskeletal: Normal range of motion.  Skin:    General: Skin is warm and dry.  Neurological:     Mental Status: She is alert and oriented to person, place, and time.  Psychiatric:        Behavior: Behavior normal.   RECENT LABS AND TESTS: BMET    Component Value Date/Time   NA 142 12/12/2018 1209   K 4.6 12/12/2018 1209   CL 105 12/12/2018 1209   CO2 23 12/12/2018 1209   GLUCOSE 101 (H) 12/12/2018 1209   GLUCOSE 129 (H) 10/28/2016 0530   BUN 13 12/12/2018 1209   CREATININE 0.69 12/12/2018 1209   CALCIUM 9.2 12/12/2018 1209   GFRNONAA 96 12/12/2018 1209   GFRAA 111 12/12/2018 1209   Lab Results  Component Value Date   HGBA1C 5.4 12/12/2018   HGBA1C 5.4 08/10/2018   Lab Results  Component Value Date   INSULIN 11.5 12/12/2018    INSULIN 10.9 08/10/2018   CBC    Component Value Date/Time   WBC 4.7 08/10/2018 1231   WBC 7.2 10/28/2016 0530   RBC 4.03 08/10/2018 1231   RBC 3.86 (L) 10/28/2016 0530   HGB 11.0 (L) 08/10/2018 1231   HCT 35.6 08/10/2018 1231   PLT 213 10/28/2016 0530   MCV 88 08/10/2018 1231   MCH 27.3 08/10/2018 1231   MCH 29.5 10/28/2016  0530   MCHC 30.9 (L) 08/10/2018 1231   MCHC 32.3 10/28/2016 0530   RDW 13.8 08/10/2018 1231   LYMPHSABS 1.7 08/10/2018 1231   EOSABS 0.1 08/10/2018 1231   BASOSABS 0.0 08/10/2018 1231   Iron/TIBC/Ferritin/ %Sat No results found for: IRON, TIBC, FERRITIN, IRONPCTSAT Lipid Panel     Component Value Date/Time   CHOL 149 12/12/2018 1209   TRIG 69 12/12/2018 1209   HDL 79 12/12/2018 1209   LDLCALC 56 12/12/2018 1209   Hepatic Function Panel     Component Value Date/Time   PROT 6.4 12/12/2018 1209   ALBUMIN 4.3 12/12/2018 1209   AST 17 12/12/2018 1209   ALT 17 12/12/2018 1209   ALKPHOS 91 12/12/2018 1209   BILITOT 0.3 12/12/2018 1209      Component Value Date/Time   TSH 1.990 08/10/2018 1231   Results for BREZLYNN, HOLMAN (MRN [id number]) as of 09/25/2019 12:07  Ref. Range 12/12/2018 12:09  Vitamin D, 25-Hydroxy Latest Ref Range: 30.0 - 100.0 ng/mL 62.8   OBESITY BEHAVIORAL INTERVENTION VISIT  Today's visit was #16   Starting weight: 236 lbs Starting date: 08/10/2018 Today's weight: 219 lbs  Today's date: 09/25/2019 Total lbs lost to date: 17    09/25/2019  Height 5\' 5"  (1.651 m)  Weight 219 lb (99.3 kg)  BMI (Calculated) 36.44  BLOOD PRESSURE - SYSTOLIC [id number]  BLOOD PRESSURE - DIASTOLIC 74   Body Fat % [id number] %  Total Body Water (lbs) 81 lbs   ASK: We discussed the diagnosis of obesity with Dana Williams today and Dana Williams agreed to give [name] permission to discuss obesity behavioral modification therapy today.  ASSESS: Dana Williams has the diagnosis of obesity and her BMI today is 36.5. Dana Williams is in the action stage of change.   ADVISE: Dana Williams  was educated on the multiple health risks of obesity as well as the benefit of weight loss to improve her health. She was advised of the need for long term treatment and the importance of lifestyle modifications to improve her current health and to decrease her risk of future health problems.  AGREE: Multiple dietary modification options and treatment options were discussed and  Dana Williams agreed to follow the recommendations documented in the above note.  ARRANGE: Dana Williams was educated on the importance of frequent visits to treat obesity as outlined per CMS and USPSTF guidelines and agreed to schedule her next follow up appointment today.  I, Michaelene Song, am acting as Location manager for Dennard Nip, MD   I have reviewed the above documentation for accuracy and completeness, and I agree with the above. -Dennard Nip, MD

## 2019-09-26 LAB — LIPID PANEL WITH LDL/HDL RATIO
Cholesterol, Total: 203 mg/dL — ABNORMAL HIGH (ref 100–199)
HDL: 93 mg/dL (ref >39)
LDL Chol Calc (NIH): 98 mg/dL (ref 0–99)
LDL/HDL Ratio: 1.1 ratio (ref 0.0–3.2)
Triglycerides: 68 mg/dL (ref 0–149)
VLDL Cholesterol Cal: 12 mg/dL (ref 5–40)

## 2019-09-26 LAB — CBC WITH DIFFERENTIAL/PLATELET
Basophils Absolute: 0.1 10*3/uL (ref 0.0–0.2)
Basos: 1 %
EOS (ABSOLUTE): 0.1 10*3/uL (ref 0.0–0.4)
Eos: 1 %
Hematocrit: 39.9 % (ref 34.0–46.6)
Hemoglobin: 12.5 g/dL (ref 11.1–15.9)
Immature Grans (Abs): 0 10*3/uL (ref 0.0–0.1)
Immature Granulocytes: 0 %
Lymphocytes Absolute: 1.3 10*3/uL (ref 0.7–3.1)
Lymphs: 14 %
MCH: 27.8 pg (ref 26.6–33.0)
MCHC: 31.3 g/dL — ABNORMAL LOW (ref 31.5–35.7)
MCV: 89 fL (ref 79–97)
Monocytes Absolute: 0.4 10*3/uL (ref 0.1–0.9)
Monocytes: 4 %
Neutrophils Absolute: 7.5 10*3/uL — ABNORMAL HIGH (ref 1.4–7.0)
Neutrophils: 80 %
Platelets: 249 10*3/uL (ref 150–450)
RBC: 4.49 x10E6/uL (ref 3.77–5.28)
RDW: 13.2 % (ref 11.7–15.4)
WBC: 9.3 10*3/uL (ref 3.4–10.8)

## 2019-09-26 LAB — COMPREHENSIVE METABOLIC PANEL
ALT: 19 IU/L (ref 0–32)
AST: 20 IU/L (ref 0–40)
Albumin/Globulin Ratio: 2.4 — ABNORMAL HIGH (ref 1.2–2.2)
Albumin: 4.5 g/dL (ref 3.8–4.9)
Alkaline Phosphatase: 122 IU/L — ABNORMAL HIGH (ref 39–117)
BUN/Creatinine Ratio: 16 (ref 9–23)
BUN: 12 mg/dL (ref 6–24)
Bilirubin Total: 0.3 mg/dL (ref 0.0–1.2)
CO2: 25 mmol/L (ref 20–29)
Calcium: 9.8 mg/dL (ref 8.7–10.2)
Chloride: 103 mmol/L (ref 96–106)
Creatinine, Ser: 0.74 mg/dL (ref 0.57–1.00)
GFR calc Af Amer: 103 mL/min/{1.73_m2} (ref >59)
GFR calc non Af Amer: 89 mL/min/{1.73_m2} (ref >59)
Globulin, Total: 1.9 g/dL (ref 1.5–4.5)
Glucose: 101 mg/dL — ABNORMAL HIGH (ref 65–99)
Potassium: 4.4 mmol/L (ref 3.5–5.2)
Sodium: 142 mmol/L (ref 134–144)
Total Protein: 6.4 g/dL (ref 6.0–8.5)

## 2019-09-26 LAB — VITAMIN D 25 HYDROXY (VIT D DEFICIENCY, FRACTURES): Vit D, 25-Hydroxy: 48.7 ng/mL (ref 30.0–100.0)

## 2019-09-26 LAB — INSULIN, RANDOM: INSULIN: 14.5 u[IU]/mL (ref 2.6–24.9)

## 2019-09-26 LAB — HEMOGLOBIN A1C
Est. average glucose Bld gHb Est-mCnc: 111 mg/dL
Hgb A1c MFr Bld: 5.5 % (ref 4.8–5.6)

## 2019-10-18 ENCOUNTER — Ambulatory Visit (INDEPENDENT_AMBULATORY_CARE_PROVIDER_SITE_OTHER): Payer: 59 | Admitting: Family Medicine

## 2019-10-18 ENCOUNTER — Other Ambulatory Visit: Payer: Self-pay

## 2019-10-18 ENCOUNTER — Encounter (INDEPENDENT_AMBULATORY_CARE_PROVIDER_SITE_OTHER): Payer: Self-pay | Admitting: Family Medicine

## 2019-10-18 VITALS — BP 105/65 | HR 94 | Temp 98.0°F | Ht 65.0 in | Wt 217.0 lb

## 2019-10-18 DIAGNOSIS — E559 Vitamin D deficiency, unspecified: Secondary | ICD-10-CM

## 2019-10-18 DIAGNOSIS — Z9189 Other specified personal risk factors, not elsewhere classified: Secondary | ICD-10-CM | POA: Diagnosis not present

## 2019-10-18 DIAGNOSIS — Z6836 Body mass index (BMI) 36.0-36.9, adult: Secondary | ICD-10-CM | POA: Diagnosis not present

## 2019-10-18 DIAGNOSIS — E8881 Metabolic syndrome: Secondary | ICD-10-CM | POA: Diagnosis not present

## 2019-10-18 MED ORDER — VITAMIN D (ERGOCALCIFEROL) 1.25 MG (50000 UNIT) PO CAPS: 50000.0000 [IU] | ORAL_CAPSULE | ORAL | 0 refills | Status: DC

## 2019-10-18 MED FILL — VIT D2 1.25 MG (50,000 UNIT: 1.25 MG | 28 days supply | Qty: 4 | Fill #0

## 2019-10-18 NOTE — Progress Notes (Signed)
Office: [phone]  /  Fax: [phone]   HPI:   Chief Complaint: OBESITY Dana Williams is here to discuss her progress with her obesity treatment plan. She is on the Category 3 plan and is following her eating plan approximately 60% of the time. She states she is exercising 0 minutes 0 times per week. Dana Williams was changed to journaling and she likes doing this. She is doing well meeting her protein goals and reports hunger is controlled.  Her weight is 217 lb (98.4 kg) today and has had a weight loss of 2 pounds over a period of 3 weeks since her last visit. She has lost 19 lbs since starting treatment with [name].  Vitamin D deficiency Dana Williams has a diagnosis of Vitamin D deficiency. She is currently stable on prescription Vit D and denies nausea, vomiting or muscle weakness. We reviewed her labs from 09/25/2019.  Insulin Resistance Dana Williams has a diagnosis of insulin resistance based on her elevated fasting insulin level >5. We reviewed her labs from 09/25/2019. A1c is stable at 5.5, but fasting insulin is slightly elevated at 14.5. Although Dana Williams's blood glucose readings are still under good control, insulin resistance puts her at greater risk of metabolic syndrome and diabetes. She is not taking metformin currently and continues to work on diet and exercise to decrease risk of diabetes.  At risk for diabetes Dana Williams is at higher than average risk for developing diabetes due to her obesity. She currently denies polyuria or polydipsia.  ASSESSMENT AND PLAN:  Vitamin D deficiency - Plan: Vitamin D, Ergocalciferol, (DRISDOL) 1.25 MG (50000 UT) CAPS capsule  Insulin resistance  At risk for diabetes mellitus  Class 2 severe obesity with serious comorbidity and body mass index (BMI) of 36.0 to 36.9 in adult, unspecified obesity type ([place])  PLAN:  Vitamin D Deficiency Dana Williams was informed that low Vitamin D levels contributes to fatigue and are associated with obesity, breast, and colon cancer. She  agrees to continue to take prescription Vit D @ 50,000 IU every week #4 with 0 refills and will follow-up for routine testing of Vitamin D, at least 2-3 times per year. She was informed of the risk of over-replacement of Vitamin D and agrees to not increase her dose unless she discusses this with [name] first. Dana Williams agrees to follow-up with our clinic in 2 weeks.  Insulin Resistance Dana Williams will continue to work on weight loss, exercise, and decreasing simple carbohydrates in her diet to help decrease the risk of diabetes. We dicussed metformin including benefits and risks. She was informed that eating too many simple carbohydrates or too many calories at one sitting increases the likelihood of GI side effects. Dana Williams will continue diet and exercise. She will have labs rechecked in 2 months.  Diabetes risk counseling Dana Williams was given extended (15 minutes) diabetes prevention counseling today. She is 58 y.o. female and has risk factors for diabetes including obesity. We discussed intensive lifestyle modifications today with an emphasis on weight loss as well as increasing exercise and decreasing simple carbohydrates in her diet.  Obesity Dana Williams is currently in the action stage of change. As such, her goal is to continue with weight loss efforts. She has agreed to keep a food journal with 1500-1700 calories and 90+ grams of protein.  Dana Williams has been instructed to work up to a goal of 150 minutes of combined cardio and strengthening exercise per week for weight loss and overall health benefits. We discussed the following Behavioral Modification Strategies today: increasing lean protein  intake and keeping healthy foods in the home.  Dana Williams has agreed to follow-up with our clinic in 2 weeks. She was informed of the importance of frequent follow-up visits to maximize her success with intensive lifestyle modifications for her multiple health conditions.  ALLERGIES: Allergies  Allergen Reactions   Sulfa  Antibiotics Other (See Comments)    Mouth ulcers    MEDICATIONS: Current Outpatient Medications on File Prior to Visit  Medication Sig Dispense Refill   ammonium lactate (LAC-HYDRIN) 12 % lotion Apply 1 application topically daily.      Azelaic Acid (FINACEA) 15 % cream Apply 1 application topically daily.      calcipotriene-betamethasone (TACLONEX) external suspension Apply 1 application topically daily as needed.      esomeprazole (NEXIUM) 40 MG capsule Take 40 mg by mouth daily before breakfast.     Multiple Vitamins-Minerals (MULTIVITAMIN/EXTRA VITAMIN D3) CHEW Chew 2 tablets by mouth daily.     norethindrone-ethinyl estradiol (FEMHRT 1/5) 1-5 MG-MCG TABS tablet Take 1 tablet by mouth daily.   12   rosuvastatin (CRESTOR) 10 MG tablet Take 10 mg by mouth daily.     valACYclovir (VALTREX) 500 MG tablet Take 500 mg by mouth daily.     No current facility-administered medications on file prior to visit.     PAST MEDICAL HISTORY: Past Medical History:  Diagnosis Date   Arthritis    Arthritis    Arthritis of pelvic region    Depression    Dry skin    Erythema multiforme    Fatigue    GERD (gastroesophageal reflux disease)    H/O psoriatic arthritis    DX  age 48---- in remission for 20 years   History of hiatal hernia    History of kidney stones    Hyperlipidemia    Joint pain    Kidney stones    Obesity    Personal history of colonic polyp - adenoma 01/29/2014   Plantar fasciitis of left foot    PONV (postoperative nausea and vomiting)    Postmenopausal    Psoriasis    Renal calculus, left    Sigmoid diverticulosis    Urticaria    Vitamin D deficiency     PAST SURGICAL HISTORY: Past Surgical History:  Procedure Laterality Date   CLOSED MANIPULATION SHOULDER     COLONOSCOPY WITH PROPOFOL  01-25-2014   CYSTO/  BILATERAL RETROGRADE PYELOGRAM/  BILATERAL URETEROSCOPIC LASER LITHOTRIPSY STONE EXTRACTIONS/  BILATERAL STENT PLACEMENT   02-13-2010   CYSTOSCOPY WITH RETROGRADE PYELOGRAM, URETEROSCOPY AND STENT PLACEMENT Left 04/21/2015   Procedure: CYSTOSCOPY WITH RETROGRADE PYELOGRAM, URETEROSCOPY, STONE EXTRACTION AND STENT PLACEMENT;  Surgeon: Franchot Gallo, MD;  Location: [facility];  Service: Urology;  Laterality: Left;   CYSTOURETHROSCOPY/  PLACEMENT LYNX SUPRAPUBIC SLING  05-24-2008   HAND TENDON SURGERY Right 1993   HOLMIUM LASER APPLICATION Left [id number]   Procedure: HOLMIUM LASER APPLICATION;  Surgeon: Franchot Gallo, MD;  Location: [facility];  Service: Urology;  Laterality: Left;   LAPAROSCOPIC CHOLECYSTECTOMY  01-31-2002   LEFT URETEROSCOPIC STONE EXTRACTION/  STENT PLACEMENT  10-06-2005   RHINOPLASTY  2007   RHINOPLASTY     STRABISMUS SURGERY Left age 24   TOTAL KNEE ARTHROPLASTY Left 10/27/2016   Procedure: TOTAL KNEE ARTHROPLASTY;  Surgeon: [name], MD;  Location: [facility];  Service: Orthopedics;  Laterality: Left;   WISDOM TOOTH EXTRACTION  age 69    SOCIAL HISTORY: Social History   Tobacco Use   Smoking status:  Never Smoker   Smokeless tobacco: Never Used  Substance Use Topics   Alcohol use: No   Drug use: No    FAMILY HISTORY: Family History  Problem Relation Age of Onset   Stomach cancer Paternal Grandmother    Hypertension Mother    Hyperlipidemia Mother    Thyroid disease Mother    Obesity Mother    Colon cancer Neg Hx    Esophageal cancer Neg Hx    Rectal cancer Neg Hx    Allergic rhinitis Neg Hx    Asthma Neg Hx    Eczema Neg Hx    Urticaria Neg Hx    ROS: Review of Systems  Gastrointestinal: Negative for nausea and vomiting.  Musculoskeletal:       Negative for muscle weakness.   PHYSICAL EXAM: Blood pressure 105/65, pulse 94, temperature 98 F (36.7 C), temperature source Oral, height 5\' 5"  (1.651 m), weight 217 lb (98.4 kg), SpO2 97 %. Body mass index is 36.11 kg/m. Physical Exam Vitals signs  reviewed.  Constitutional:      Appearance: Normal appearance. She is obese.  Cardiovascular:     Rate and Rhythm: Normal rate.     Pulses: Normal pulses.  Pulmonary:     Effort: Pulmonary effort is normal.     Breath sounds: Normal breath sounds.  Musculoskeletal: Normal range of motion.  Skin:    General: Skin is warm and dry.  Neurological:     Mental Status: She is alert and oriented to person, place, and time.  Psychiatric:        Behavior: Behavior normal.   RECENT LABS AND TESTS: BMET    Component Value Date/Time   NA 142 09/25/2019 0935   K 4.4 09/25/2019 0935   CL 103 09/25/2019 0935   CO2 25 09/25/2019 0935   GLUCOSE 101 (H) 09/25/2019 0935   GLUCOSE 129 (H) 10/28/2016 0530   BUN 12 09/25/2019 0935   CREATININE 0.74 09/25/2019 0935   CALCIUM 9.8 09/25/2019 0935   GFRNONAA 89 09/25/2019 0935   GFRAA 103 09/25/2019 0935   Lab Results  Component Value Date   HGBA1C 5.5 09/25/2019   HGBA1C 5.4 12/12/2018   HGBA1C 5.4 08/10/2018   Lab Results  Component Value Date   INSULIN 14.5 09/25/2019   INSULIN 11.5 12/12/2018   INSULIN 10.9 08/10/2018   CBC    Component Value Date/Time   WBC 9.3 09/25/2019 0935   WBC 7.2 10/28/2016 0530   RBC 4.49 09/25/2019 0935   RBC 3.86 (L) 10/28/2016 0530   HGB 12.5 09/25/2019 0935   HCT 39.9 09/25/2019 0935   PLT 249 09/25/2019 0935   MCV 89 09/25/2019 0935   MCH 27.8 09/25/2019 0935   MCH 29.5 10/28/2016 0530   MCHC 31.3 (L) 09/25/2019 0935   MCHC 32.3 10/28/2016 0530   RDW 13.2 09/25/2019 0935   LYMPHSABS 1.3 09/25/2019 0935   EOSABS 0.1 09/25/2019 0935   BASOSABS 0.1 09/25/2019 0935   Iron/TIBC/Ferritin/ %Sat No results found for: IRON, TIBC, FERRITIN, IRONPCTSAT Lipid Panel     Component Value Date/Time   CHOL 203 (H) 09/25/2019 0935   TRIG 68 09/25/2019 0935   HDL 93 09/25/2019 0935   LDLCALC 98 09/25/2019 0935   Hepatic Function Panel     Component Value Date/Time   PROT 6.4 09/25/2019 0935    ALBUMIN 4.5 09/25/2019 0935   AST 20 09/25/2019 0935   ALT 19 09/25/2019 0935   ALKPHOS 122 (H) 09/25/2019 0935  BILITOT 0.3 09/25/2019 0935      Component Value Date/Time   TSH 1.990 08/10/2018 1231   Results for DORATHY, BODENHAMER (MRN [id number]) as of 10/18/2019 11:24  Ref. Range 09/25/2019 09:35  Vitamin D, 25-Hydroxy Latest Ref Range: 30.0 - 100.0 ng/mL 48.7   OBESITY BEHAVIORAL INTERVENTION VISIT  Today's visit was #17  Starting weight: 236 lbs Starting date: 08/10/2018 Today's weight: 217 lbs Today's date: 10/18/2019 Total lbs lost to date: 19     10/18/2019  Height 5\' 5"  (1.651 m)  Weight 217 lb (98.4 kg)  BMI (Calculated) 36.11  BLOOD PRESSURE - SYSTOLIC [id number]  BLOOD PRESSURE - DIASTOLIC 65   Body Fat % 43 %  Total Body Water (lbs) 80 lbs   ASK: We discussed the diagnosis of obesity with Dana Williams today and Dana Williams agreed to give [name] permission to discuss obesity behavioral modification therapy today.  ASSESS: Dana Williams has the diagnosis of obesity and her BMI today is 36.1. Shariann is in the action stage of change.   ADVISE: Dana Williams was educated on the multiple health risks of obesity as well as the benefit of weight loss to improve her health. She was advised of the need for long term treatment and the importance of lifestyle modifications to improve her current health and to decrease her risk of future health problems.  AGREE: Multiple dietary modification options and treatment options were discussed and  Tykira agreed to follow the recommendations documented in the above note.  ARRANGE: Dana Williams was educated on the importance of frequent visits to treat obesity as outlined per CMS and USPSTF guidelines and agreed to schedule her next follow up appointment today.  I, Michaelene Song, am acting as Location manager for Dennard Nip, MD I have reviewed the above documentation for accuracy and completeness, and I agree with the above. -Dennard Nip, MD

## 2019-10-23 MED FILL — SERTRALINE HCL 50 MG TABLET: 50 | 90 days supply | Qty: 90 | Fill #1

## 2019-10-23 MED FILL — ESTRADIOL-NORETHINDRONE ACE: 1-0.5 | 84 days supply | Qty: 84 | Fill #0

## 2019-11-06 ENCOUNTER — Other Ambulatory Visit: Payer: Self-pay

## 2019-11-06 ENCOUNTER — Ambulatory Visit (INDEPENDENT_AMBULATORY_CARE_PROVIDER_SITE_OTHER): Payer: 59 | Admitting: Family Medicine

## 2019-11-06 ENCOUNTER — Encounter (INDEPENDENT_AMBULATORY_CARE_PROVIDER_SITE_OTHER): Payer: Self-pay | Admitting: Family Medicine

## 2019-11-06 VITALS — BP 119/72 | HR 99 | Temp 98.1°F | Ht 65.0 in | Wt 220.0 lb

## 2019-11-06 DIAGNOSIS — Z6836 Body mass index (BMI) 36.0-36.9, adult: Secondary | ICD-10-CM | POA: Diagnosis not present

## 2019-11-06 DIAGNOSIS — E559 Vitamin D deficiency, unspecified: Secondary | ICD-10-CM | POA: Diagnosis not present

## 2019-11-06 DIAGNOSIS — F3289 Other specified depressive episodes: Secondary | ICD-10-CM

## 2019-11-06 DIAGNOSIS — Z9189 Other specified personal risk factors, not elsewhere classified: Secondary | ICD-10-CM | POA: Diagnosis not present

## 2019-11-06 MED ORDER — VITAMIN D (ERGOCALCIFEROL) 1.25 MG (50000 UNIT) PO CAPS: 50000.0000 [IU] | ORAL_CAPSULE | ORAL | 0 refills | Status: DC

## 2019-11-06 NOTE — Progress Notes (Signed)
Office: [phone]  /  Fax: [phone]   HPI:  Chief Complaint: OBESITY Dana Williams is here to discuss her progress with her obesity treatment plan. She is on the Category 3 plan and states she is following her eating plan approximately 50% of the time. She states she is exercising 0 minutes 0 times per week.  Dana Williams has been struggling with some celebration eating and is worried about Christmas, especially since her grown children will be coming and there are increased temptations. She feels she will do better and get back on track after Christmas.  Today's visit was #18  Starting weight: 236 lbs Starting date: 08/10/2018 Today's weight: 220 lbs  Today's date: 11/06/2019 Total lbs lost to date: 16  Total lbs lost since last in-office visit: 0   Vitamin D deficiency Dana Williams has a diagnosis of Vitamin D deficiency and is taking prescription Vitamin D. Last Vitamin D level was almost at goal (48.7 on 09/25/2019). No nausea, vomiting, or muscle weakness.  At risk for osteopenia and osteoporosis Dana Williams is at higher risk of osteopenia and osteoporosis due to Vitamin D deficiency.   Depression Dana Williams's PCP started her on Zoloft and she has heard this can cause weight gain. She doesn't feel her recent weight gain was due to the Zoloft.  ASSESSMENT AND PLAN:  Vitamin D deficiency - Plan: Vitamin D, Ergocalciferol, (DRISDOL) 1.25 MG (50000 UT) CAPS capsule  Other depression, with emotional eating  At risk for osteoporosis  Class 2 severe obesity with serious comorbidity and body mass index (BMI) of 36.0 to 36.9 in adult, unspecified obesity type ([place])  PLAN:  Vitamin D Deficiency Dana Williams was informed that low Vitamin D levels contributes to fatigue and are associated with obesity, breast, and colon cancer. She agrees to continue to take prescription Vit D @ 50,000 IU every week #4 with 0 refills and will follow-up for routine testing of Vitamin D, at least 2-3 times per year. She was  informed of the risk of over-replacement of Vitamin D and agrees to not increase her dose unless she discusses this with [name] first. Dana Williams agrees to follow-up with our clinic in 3 weeks.  At risk for osteopenia and osteoporosis Dana Williams was given extended  (15 minutes) osteoporosis prevention counseling today. Dana Williams is at risk for osteopenia and osteoporosis due to her Vitamin D deficiency. She was encouraged to take her Vitamin D and follow hr higher calcium diet and increase strengthening exercise to help strengthen her bones and decrease her risk of osteopenia and osteoporosis.  Depression Dana Williams was advised Zoloft could cause weight gain, but benefits may outweigh any negatives. I recommend she not make any changes until after the holidays are finished.  Obesity Dana Williams is currently in the action stage of change. As such, her goal is to continue with weight loss efforts. She has agreed to keep a food journal with 1500-1700 calories and 90+ grams of protein.  Dana Williams has been instructed to work up to a goal of 150 minutes of combined cardio and strengthening exercise per week for weight loss and overall health benefits. We discussed the following Behavioral Modification Strategies today: emotional eating strategies, dealing with family/coworker sabotage, and holiday eating strategies.   Dana Williams has agreed to follow-up with our clinic in 3 weeks. She was informed of the importance of frequent follow-up visits to maximize her success with intensive lifestyle modifications for her multiple health conditions.  ALLERGIES: Allergies  Allergen Reactions  . Sulfa Antibiotics Other (See Comments)  Mouth ulcers    MEDICATIONS: Current Outpatient Medications on File Prior to Visit  Medication Sig Dispense Refill  . ammonium lactate (LAC-HYDRIN) 12 % lotion Apply 1 application topically daily.     . Azelaic Acid (FINACEA) 15 % cream Apply 1 application topically daily.     . calcipotriene-betamethasone  (TACLONEX) external suspension Apply 1 application topically daily as needed.     [name] esomeprazole (NEXIUM) 40 MG capsule Take 40 mg by mouth daily before breakfast.    . Multiple Vitamins-Minerals (MULTIVITAMIN/EXTRA VITAMIN D3) CHEW Chew 2 tablets by mouth daily.    . norethindrone-ethinyl estradiol (FEMHRT 1/5) 1-5 MG-MCG TABS tablet Take 1 tablet by mouth daily.   12  . rosuvastatin (CRESTOR) 10 MG tablet Take 10 mg by mouth daily.    . sertraline (ZOLOFT) 50 MG tablet Take 50 mg by mouth at bedtime.    . valACYclovir (VALTREX) 500 MG tablet Take 500 mg by mouth daily.     No current facility-administered medications on file prior to visit.    PAST MEDICAL HISTORY: Past Medical History:  Diagnosis Date  . Arthritis   . Arthritis   . Arthritis of pelvic region   . Depression   . Dry skin   . Erythema multiforme   . Fatigue   . GERD (gastroesophageal reflux disease)   . H/O psoriatic arthritis    DX  age 58---- in remission for 20 years  . History of hiatal hernia   . History of kidney stones   . Hyperlipidemia   . Joint pain   . Kidney stones   . Obesity   . Personal history of colonic polyp - adenoma 01/29/2014  . Plantar fasciitis of left foot   . PONV (postoperative nausea and vomiting)   . Postmenopausal   . Psoriasis   . Renal calculus, left   . Sigmoid diverticulosis   . Urticaria   . Vitamin D deficiency     PAST SURGICAL HISTORY: Past Surgical History:  Procedure Laterality Date  . CLOSED MANIPULATION SHOULDER    . COLONOSCOPY WITH PROPOFOL  01-25-2014  . CYSTO/  BILATERAL RETROGRADE PYELOGRAM/  BILATERAL URETEROSCOPIC LASER LITHOTRIPSY STONE EXTRACTIONS/  BILATERAL STENT PLACEMENT  02-13-2010  . CYSTOSCOPY WITH RETROGRADE PYELOGRAM, URETEROSCOPY AND STENT PLACEMENT Left 04/21/2015   Procedure: CYSTOSCOPY WITH RETROGRADE PYELOGRAM, URETEROSCOPY, STONE EXTRACTION AND STENT PLACEMENT;  Surgeon: Franchot Gallo, MD;  Location: [facility];  Service:  Urology;  Laterality: Left;  . CYSTOURETHROSCOPY/  PLACEMENT LYNX SUPRAPUBIC SLING  05-24-2008  . HAND TENDON SURGERY Right 1993  . HOLMIUM LASER APPLICATION Left [id number]   Procedure: HOLMIUM LASER APPLICATION;  Surgeon: Franchot Gallo, MD;  Location: [facility];  Service: Urology;  Laterality: Left;  . LAPAROSCOPIC CHOLECYSTECTOMY  01-31-2002  . LEFT URETEROSCOPIC STONE EXTRACTION/  STENT PLACEMENT  10-06-2005  . RHINOPLASTY  2007  . RHINOPLASTY    . STRABISMUS SURGERY Left age 89  . TOTAL KNEE ARTHROPLASTY Left 10/27/2016   Procedure: TOTAL KNEE ARTHROPLASTY;  Surgeon: [name], MD;  Location: [facility];  Service: Orthopedics;  Laterality: Left;  . WISDOM TOOTH EXTRACTION  age 69    SOCIAL HISTORY: Social History   Tobacco Use  . Smoking status: Never Smoker  . Smokeless tobacco: Never Used  Substance Use Topics  . Alcohol use: No  . Drug use: No    FAMILY HISTORY: Family History  Problem Relation Age of Onset  . Stomach cancer Paternal Grandmother   .  Hypertension Mother   . Hyperlipidemia Mother   . Thyroid disease Mother   . Obesity Mother   . Colon cancer Neg Hx   . Esophageal cancer Neg Hx   . Rectal cancer Neg Hx   . Allergic rhinitis Neg Hx   . Asthma Neg Hx   . Eczema Neg Hx   . Urticaria Neg Hx    ROS: Review of Systems  Constitutional: Negative for weight loss.  Gastrointestinal: Negative for nausea and vomiting.  Musculoskeletal:       Negative for muscle weakness.  Psychiatric/Behavioral: Positive for depression.   PHYSICAL EXAM: Blood pressure 119/72, pulse 99, temperature 98.1 F (36.7 C), temperature source Oral, height 5\' 5"  (1.651 m), weight 220 lb (99.8 kg), SpO2 98 %. Body mass index is 36.61 kg/m. Physical Exam Vitals reviewed.  Constitutional:      Appearance: Normal appearance. She is obese.  Cardiovascular:     Rate and Rhythm: Normal rate.     Pulses: Normal pulses.  Pulmonary:     Effort: Pulmonary effort  is normal.     Breath sounds: Normal breath sounds.  Musculoskeletal:        General: Normal range of motion.  Skin:    General: Skin is warm and dry.  Neurological:     Mental Status: She is alert and oriented to person, place, and time.  Psychiatric:        Behavior: Behavior normal.   RECENT LABS AND TESTS: BMET    Component Value Date/Time   NA 142 09/25/2019 0935   K 4.4 09/25/2019 0935   CL 103 09/25/2019 0935   CO2 25 09/25/2019 0935   GLUCOSE 101 (H) 09/25/2019 0935   GLUCOSE 129 (H) 10/28/2016 0530   BUN 12 09/25/2019 0935   CREATININE 0.74 09/25/2019 0935   CALCIUM 9.8 09/25/2019 0935   GFRNONAA 89 09/25/2019 0935   GFRAA 103 09/25/2019 0935   Lab Results  Component Value Date   HGBA1C 5.5 09/25/2019   HGBA1C 5.4 12/12/2018   HGBA1C 5.4 08/10/2018   Lab Results  Component Value Date   INSULIN 14.5 09/25/2019   INSULIN 11.5 12/12/2018   INSULIN 10.9 08/10/2018   CBC    Component Value Date/Time   WBC 9.3 09/25/2019 0935   WBC 7.2 10/28/2016 0530   RBC 4.49 09/25/2019 0935   RBC 3.86 (L) 10/28/2016 0530   HGB 12.5 09/25/2019 0935   HCT 39.9 09/25/2019 0935   PLT 249 09/25/2019 0935   MCV 89 09/25/2019 0935   MCH 27.8 09/25/2019 0935   MCH 29.5 10/28/2016 0530   MCHC 31.3 (L) 09/25/2019 0935   MCHC 32.3 10/28/2016 0530   RDW 13.2 09/25/2019 0935   LYMPHSABS 1.3 09/25/2019 0935   EOSABS 0.1 09/25/2019 0935   BASOSABS 0.1 09/25/2019 0935   Iron/TIBC/Ferritin/ %Sat No results found for: IRON, TIBC, FERRITIN, IRONPCTSAT Lipid Panel     Component Value Date/Time   CHOL 203 (H) 09/25/2019 0935   TRIG 68 09/25/2019 0935   HDL 93 09/25/2019 0935   LDLCALC 98 09/25/2019 0935   Hepatic Function Panel     Component Value Date/Time   PROT 6.4 09/25/2019 0935   ALBUMIN 4.5 09/25/2019 0935   AST 20 09/25/2019 0935   ALT 19 09/25/2019 0935   ALKPHOS 122 (H) 09/25/2019 0935   BILITOT 0.3 09/25/2019 0935      Component Value Date/Time   TSH 1.990  08/10/2018 1231      I, Michaelene Song,  am acting as transcriptionist for Dennard Nip, MD I have reviewed the above documentation for accuracy and completeness, and I agree with the above. -Dennard Nip, MD

## 2019-11-12 MED FILL — VIT D2 1.25 MG (50,000 UNIT: 1.25 MG | 28 days supply | Qty: 4 | Fill #0

## 2019-11-14 DIAGNOSIS — M25551 Pain in right hip: Secondary | ICD-10-CM | POA: Diagnosis not present

## 2019-11-14 DIAGNOSIS — M25561 Pain in right knee: Secondary | ICD-10-CM | POA: Diagnosis not present

## 2019-11-29 ENCOUNTER — Other Ambulatory Visit (HOSPITAL_COMMUNITY): Payer: Self-pay | Admitting: Family Medicine

## 2019-11-29 ENCOUNTER — Ambulatory Visit (INDEPENDENT_AMBULATORY_CARE_PROVIDER_SITE_OTHER): Payer: 59 | Admitting: Bariatrics

## 2019-11-29 DIAGNOSIS — F329 Major depressive disorder, single episode, unspecified: Secondary | ICD-10-CM | POA: Diagnosis not present

## 2019-11-29 DIAGNOSIS — L4050 Arthropathic psoriasis, unspecified: Secondary | ICD-10-CM | POA: Diagnosis not present

## 2019-11-29 DIAGNOSIS — F411 Generalized anxiety disorder: Secondary | ICD-10-CM | POA: Diagnosis not present

## 2019-11-29 MED FILL — VIT D2 1.25 MG (50,000 UNIT: 1.25 MG | 28 days supply | Qty: 4 | Fill #0

## 2019-12-04 ENCOUNTER — Encounter (INDEPENDENT_AMBULATORY_CARE_PROVIDER_SITE_OTHER): Payer: Self-pay | Admitting: Family Medicine

## 2019-12-04 NOTE — Telephone Encounter (Signed)
90 days ok

## 2019-12-04 NOTE — Telephone Encounter (Signed)
Please advise 

## 2019-12-06 ENCOUNTER — Other Ambulatory Visit (INDEPENDENT_AMBULATORY_CARE_PROVIDER_SITE_OTHER): Payer: Self-pay

## 2019-12-06 DIAGNOSIS — E8881 Metabolic syndrome: Secondary | ICD-10-CM

## 2019-12-06 MED ORDER — METFORMIN HCL 500 MG PO TABS: 500.0000 mg | ORAL_TABLET | Freq: Every day | ORAL | 0 refills | Status: DC

## 2019-12-06 MED FILL — metFORMIN HCL 500 MG TABS: 500 | 90 days supply | Qty: 90 | Fill #0

## 2019-12-18 ENCOUNTER — Encounter (INDEPENDENT_AMBULATORY_CARE_PROVIDER_SITE_OTHER): Payer: Self-pay | Admitting: Family Medicine

## 2019-12-18 ENCOUNTER — Ambulatory Visit (INDEPENDENT_AMBULATORY_CARE_PROVIDER_SITE_OTHER): Payer: 59 | Admitting: Family Medicine

## 2019-12-18 ENCOUNTER — Other Ambulatory Visit: Payer: Self-pay

## 2019-12-18 VITALS — BP 121/79 | HR 101 | Temp 98.1°F | Ht 65.0 in | Wt 223.0 lb

## 2019-12-18 DIAGNOSIS — Z9189 Other specified personal risk factors, not elsewhere classified: Secondary | ICD-10-CM | POA: Diagnosis not present

## 2019-12-18 DIAGNOSIS — E559 Vitamin D deficiency, unspecified: Secondary | ICD-10-CM

## 2019-12-18 DIAGNOSIS — Z6837 Body mass index (BMI) 37.0-37.9, adult: Secondary | ICD-10-CM

## 2019-12-18 DIAGNOSIS — E8881 Metabolic syndrome: Secondary | ICD-10-CM | POA: Diagnosis not present

## 2019-12-18 MED ORDER — VITAMIN D (ERGOCALCIFEROL) 1.25 MG (50000 UNIT) PO CAPS: 50000.0000 [IU] | ORAL_CAPSULE | ORAL | 0 refills | Status: DC

## 2019-12-19 NOTE — Progress Notes (Signed)
Chief Complaint:   OBESITY Dana Williams is here to discuss her progress with her obesity treatment plan along with follow-up of her obesity related diagnoses. Dana Williams is on keeping a food journal and adhering to recommended goals of 1500-1700 calories and 90+ grams of protein daily and states she is following her eating plan approximately 50% of the time. [name] states she is doing 0 minutes 0 times per week.  Today's visit was #: 34 Starting weight: 236 lbs Starting date: 08/10/18 Today's weight: 223 lbs Today's date: 12/18/2019 Total lbs lost to date: 13 Total lbs lost since last in-office visit: 0  Interim History: [name]'s last visit was approximately 2 months ago before the holidays. She has deviated more from her plan and has gained a bit of weight, but she is ready to get back on track.  Subjective:   1. Vitamin D deficiency Dana Williams is stable on Vit D, and she denies nausea, vomiting, or muscle weakness.  2. Insulin resistance Dana Williams was started on metformin but she hasn't started yet. She is ready to get back on track with her eating and starting her medications.  3. At high risk for malnutrition Dana Williams is at increased risk for malnutrition due to decreased protein.   Assessment/Plan:   1. Vitamin D deficiency Low Vitamin D level contributes to fatigue and are associated with obesity, breast, and colon cancer. We will refill prescription Vitamin D for 1 month. Dana Williams will follow-up for routine testing of Vitamin D, at least 2-3 times per year to avoid over-replacement.  - Vitamin D, Ergocalciferol, (DRISDOL) 1.25 MG (50000 UNIT) CAPS capsule; Take 1 capsule (50,000 Units total) by mouth every 7 (seven) days.  Dispense: 4 capsule; Refill: 0  2. Insulin resistance Dana Williams will continue to work on weight loss, exercise, and decreasing simple carbohydrates to help decrease the risk of diabetes. Dana Williams agreed to start metformin (no refill needed). Dana Williams agreed to follow-up with [name] as directed  to closely monitor her progress.  3. At high risk for malnutrition Dana Williams was given approximately 15 minutes of counseling today regarding prevention of malnutrition. Dana Williams was advised that having bariatric surgery increases her risk for anemia, malnutrition, and vitamin deficiencies.   4. Class 2 severe obesity with serious comorbidity and body mass index (BMI) of 37.0 to 37.9 in adult, unspecified obesity type [facility]) Dana Williams is currently in the action stage of change. As such, her goal is to continue with weight loss efforts. She has agreed to keeping a food journal and adhering to recommended goals of 1500-1800 calories and 100+ grams of protein daily.   Behavioral modification strategies: increasing lean protein intake and meal planning and cooking strategies.  Dana Williams has agreed to follow-up with our clinic in 2 to 3 weeks. She was informed of the importance of frequent follow-up visits to maximize her success with intensive lifestyle modifications for her multiple health conditions.   Objective:   Blood pressure 121/79, pulse (!) 101, temperature 98.1 F (36.7 C), temperature source Oral, height 5\' 5"  (1.651 m), weight 223 lb (101.2 kg), SpO2 97 %. Body mass index is 37.11 kg/m.  General: Cooperative, alert, well developed, in no acute distress. HEENT: Conjunctivae and lids unremarkable. Cardiovascular: Regular rhythm.  Lungs: Normal work of breathing. Neurologic: No focal deficits.   Lab Results  Component Value Date   CREATININE 0.74 09/25/2019   BUN 12 09/25/2019   NA 142 09/25/2019   K 4.4 09/25/2019   CL 103 09/25/2019   CO2 25 09/25/2019  Lab Results  Component Value Date   ALT 19 09/25/2019   AST 20 09/25/2019   ALKPHOS 122 (H) 09/25/2019   BILITOT 0.3 09/25/2019   Lab Results  Component Value Date   HGBA1C 5.5 09/25/2019   HGBA1C 5.4 12/12/2018   HGBA1C 5.4 08/10/2018   Lab Results  Component Value Date   INSULIN 14.5 09/25/2019   INSULIN 11.5 12/12/2018    INSULIN 10.9 08/10/2018   Lab Results  Component Value Date   TSH 1.990 08/10/2018   Lab Results  Component Value Date   CHOL 203 (H) 09/25/2019   HDL 93 09/25/2019   LDLCALC 98 09/25/2019   TRIG 68 09/25/2019   Lab Results  Component Value Date   WBC 9.3 09/25/2019   HGB 12.5 09/25/2019   HCT 39.9 09/25/2019   MCV 89 09/25/2019   PLT 249 09/25/2019   No results found for: IRON, TIBC, FERRITIN  Attestation Statements:   Reviewed by clinician on day of visit: allergies, medications, problem list, medical history, surgical history, family history, social history, and previous encounter notes.   I, Trixie Dredge, am acting as transcriptionist for Dennard Nip, MD.  I have reviewed the above documentation for accuracy and completeness, and I agree with the above. -  Dennard Nip, MD

## 2019-12-22 MED FILL — ESOMEPRAZOLE MAG DR 40 MG C: 40 | 90 days supply | Qty: 90 | Fill #0

## 2019-12-22 MED FILL — hydrOXYzine HCL 10 MG TABS: 10 | 90 days supply | Qty: 180 | Fill #1

## 2019-12-22 MED FILL — VIT D2 1.25 MG (50,000 UNIT: 1.25 MG | 28 days supply | Qty: 4 | Fill #0

## 2019-12-22 MED FILL — ROSUVASTATIN CALCIUM 10 MG: 10 | 90 days supply | Qty: 90 | Fill #0

## 2020-01-01 ENCOUNTER — Ambulatory Visit (INDEPENDENT_AMBULATORY_CARE_PROVIDER_SITE_OTHER): Payer: 59 | Admitting: Physician Assistant

## 2020-01-01 ENCOUNTER — Other Ambulatory Visit: Payer: Self-pay

## 2020-01-01 ENCOUNTER — Encounter (INDEPENDENT_AMBULATORY_CARE_PROVIDER_SITE_OTHER): Payer: Self-pay | Admitting: Physician Assistant

## 2020-01-01 VITALS — BP 127/75 | HR 92 | Temp 98.3°F | Ht 65.0 in | Wt 226.0 lb

## 2020-01-01 DIAGNOSIS — E7849 Other hyperlipidemia: Secondary | ICD-10-CM

## 2020-01-01 DIAGNOSIS — E8881 Metabolic syndrome: Secondary | ICD-10-CM

## 2020-01-01 DIAGNOSIS — Z6837 Body mass index (BMI) 37.0-37.9, adult: Secondary | ICD-10-CM | POA: Diagnosis not present

## 2020-01-01 NOTE — Progress Notes (Signed)
Chief Complaint:   OBESITY Dana Williams is here to discuss her progress with her obesity treatment plan along with follow-up of her obesity related diagnoses. Dana Williams is on the Category 3 Plan and states she is following her eating plan approximately 60% of the time. Dana Williams states she is exercising 0 minutes 0 times per week.  Today's visit was #: 20 Starting weight: 236 lbs Starting date: 08/10/2018 Today's weight: 226 lbs Today's date: 01/01/2020 Total lbs lost to date: 10 Total lbs lost since last in-office visit: 0  Interim History: Dana Williams states that she feels like she followed the plan well except the last 3 days when she ate out more often because she lost power. She is not drinking enough water.  Subjective:   Other hyperlipidemia. Dana Williams is on atorvastatin.  No chest pain. No myalgias.   Lab Results  Component Value Date   CHOL 203 (H) 09/25/2019   HDL 93 09/25/2019   LDLCALC 98 09/25/2019   TRIG 68 09/25/2019   Lab Results  Component Value Date   ALT 19 09/25/2019   AST 20 09/25/2019   ALKPHOS 122 (H) 09/25/2019   BILITOT 0.3 09/25/2019   The 10-year ASCVD risk score Dana Williams DC Jr., et al., 2013) is: 2%   Values used to calculate the score:     Age: 59 years     Sex: Female     Is Non-Hispanic African American: No     Diabetic: No     Tobacco smoker: No     Systolic Blood Pressure: [id number]     Is BP treated: No     HDL Cholesterol: 93 mg/dL     Total Cholesterol: 203 mg/dL  Insulin resistance. Dana Williams has a diagnosis of insulin resistance based on her elevated fasting insulin level >5. She continues to work on diet and exercise to decrease her risk of diabetes. She is on metformin. No nausea, vomiting, or diarrhea. No polyphagia, but states she tends to snack more at night.  Lab Results  Component Value Date   INSULIN 14.5 09/25/2019   INSULIN 11.5 12/12/2018   INSULIN 10.9 08/10/2018   Lab Results  Component Value Date   HGBA1C 5.5 09/25/2019    Assessment/Plan:   Other hyperlipidemia. Cardiovascular risk and specific lipid/LDL goals reviewed.  We discussed several lifestyle modifications today and Rhythm will continue to work on diet, exercise and weight loss efforts. Orders and follow up as documented in patient record. She will continue her medications as directed.  Counseling Intensive lifestyle modifications are the first line treatment for this issue. . Dietary changes: Increase soluble fiber. Decrease simple carbohydrates. . Exercise changes: Moderate to vigorous-intensity aerobic activity 150 minutes per week if tolerated. . Lipid-lowering medications: see documented in medical record.  Insulin resistance. Dana Williams will continue to work on weight loss, exercise, and decreasing simple carbohydrates to help decrease the risk of diabetes. Dana Williams agreed to follow-up with [name] as directed to closely monitor her progress. She will continue her medications as directed.  Class 2 severe obesity with serious comorbidity and body mass index (BMI) of 37.0 to 37.9 in adult, unspecified obesity type ([place]).  Dana Williams is currently in the action stage of change. As such, her goal is to continue with weight loss efforts. She has agreed to keeping a food journal and adhering to recommended goals of 1600-1800 calories and 100 grams of protein daily.   Exercise goals: For substantial health benefits, adults should do at least 150  minutes (2 hours and 30 minutes) a week of moderate-intensity, or 75 minutes (1 hour and 15 minutes) a week of vigorous-intensity aerobic physical activity, or an equivalent combination of moderate- and vigorous-intensity aerobic activity. Aerobic activity should be performed in episodes of at least 10 minutes, and preferably, it should be spread throughout the week.  Behavioral modification strategies: increasing lean protein intake and meal planning and cooking strategies.  Dana Williams has agreed to follow-up with our clinic in 2  weeks. She was informed of the importance of frequent follow-up visits to maximize her success with intensive lifestyle modifications for her multiple health conditions.   Objective:   Blood pressure 127/75, pulse 92, temperature 98.3 F (36.8 C), temperature source Oral, height 5\' 5"  (1.651 m), weight 226 lb (102.5 kg), SpO2 97 %. Body mass index is 37.61 kg/m.  General: Cooperative, alert, well developed, in no acute distress. HEENT: Conjunctivae and lids unremarkable. Cardiovascular: Regular rhythm.  Lungs: Normal work of breathing. Neurologic: No focal deficits.   Lab Results  Component Value Date   CREATININE 0.74 09/25/2019   BUN 12 09/25/2019   NA 142 09/25/2019   K 4.4 09/25/2019   CL 103 09/25/2019   CO2 25 09/25/2019   Lab Results  Component Value Date   ALT 19 09/25/2019   AST 20 09/25/2019   ALKPHOS 122 (H) 09/25/2019   BILITOT 0.3 09/25/2019   Lab Results  Component Value Date   HGBA1C 5.5 09/25/2019   HGBA1C 5.4 12/12/2018   HGBA1C 5.4 08/10/2018   Lab Results  Component Value Date   INSULIN 14.5 09/25/2019   INSULIN 11.5 12/12/2018   INSULIN 10.9 08/10/2018   Lab Results  Component Value Date   TSH 1.990 08/10/2018   Lab Results  Component Value Date   CHOL 203 (H) 09/25/2019   HDL 93 09/25/2019   LDLCALC 98 09/25/2019   TRIG 68 09/25/2019   Lab Results  Component Value Date   WBC 9.3 09/25/2019   HGB 12.5 09/25/2019   HCT 39.9 09/25/2019   MCV 89 09/25/2019   PLT 249 09/25/2019   No results found for: IRON, TIBC, FERRITIN  Attestation Statements:   Reviewed by clinician on day of visit: allergies, medications, problem list, medical history, surgical history, family history, social history, and previous encounter notes.  Time spent on visit including pre-visit chart review and post-visit care was 33 minutes.   IMichaelene Song, am acting as transcriptionist for Abby Potash, PA-C   I have reviewed the above documentation for  accuracy and completeness, and I agree with the above. Abby Potash, PA-C

## 2020-01-15 ENCOUNTER — Ambulatory Visit (INDEPENDENT_AMBULATORY_CARE_PROVIDER_SITE_OTHER): Payer: 59 | Admitting: Family Medicine

## 2020-01-15 ENCOUNTER — Other Ambulatory Visit: Payer: Self-pay

## 2020-01-15 ENCOUNTER — Encounter (INDEPENDENT_AMBULATORY_CARE_PROVIDER_SITE_OTHER): Payer: Self-pay | Admitting: Family Medicine

## 2020-01-15 VITALS — BP 122/73 | HR 110 | Temp 98.4°F | Ht 65.0 in | Wt 224.0 lb

## 2020-01-15 DIAGNOSIS — Z6837 Body mass index (BMI) 37.0-37.9, adult: Secondary | ICD-10-CM

## 2020-01-15 DIAGNOSIS — Z9189 Other specified personal risk factors, not elsewhere classified: Secondary | ICD-10-CM

## 2020-01-15 DIAGNOSIS — E559 Vitamin D deficiency, unspecified: Secondary | ICD-10-CM | POA: Diagnosis not present

## 2020-01-15 DIAGNOSIS — E8881 Metabolic syndrome: Secondary | ICD-10-CM

## 2020-01-15 MED ORDER — VITAMIN D (ERGOCALCIFEROL) 1.25 MG (50000 UNIT) PO CAPS: 50000.0000 [IU] | ORAL_CAPSULE | ORAL | 0 refills | Status: DC

## 2020-01-15 MED ORDER — METFORMIN HCL 500 MG PO TABS: 500.0000 mg | ORAL_TABLET | Freq: Two times a day (BID) | ORAL | 0 refills | Status: DC

## 2020-01-15 MED FILL — VIT D2 1.25 MG (50,000 UNIT: 1.25 MG | 28 days supply | Qty: 4 | Fill #0

## 2020-01-15 MED FILL — metFORMIN HCL 500 MG TABS: 500 | 30 days supply | Qty: 60 | Fill #0

## 2020-01-15 NOTE — Progress Notes (Signed)
Chief Complaint:   OBESITY Dana Williams is here to discuss her progress with her obesity treatment plan along with follow-up of her obesity related diagnoses. Dana Williams is on keeping a food journal and adhering to recommended goals of 1600-1800 calories and 100 grams of protein daily and states she is following her eating plan approximately 90% of the time. 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  Today's visit was #: 21 Starting weight: 236 lbs Starting date: 08/10/2018 Today's weight: 224 lbs Today's date: 01/15/2020 Total lbs lost to date: 12 Total lbs lost since last in-office visit: 2  Interim History: Dana Williams has done well with weight loss and journaling. She is happy with the freedom and her choices, and she is mostly eating "real food" protein but has been eating more protein supplements recently.  Subjective:   1. Insulin resistance Dana Williams is still struggling with PM polyphagia, and she is tolerating metformin well.  2. Vitamin D deficiency Dana Williams is stable on Vit D, and her level is almost at goal.  3. At risk for impaired metabolic function Dana Williams is at increased risk for impaired metabolic function due to increased protein supplements.  Assessment/Plan:   1. Insulin resistance Dana Williams will continue to work on weight loss, exercise, and decreasing simple carbohydrates to help decrease the risk of diabetes. Dana Williams agreed to increase metformin to 500 mg BID with no refill. Dana Williams agreed to follow-up with [name] as directed to closely monitor her progress.  - metFORMIN (GLUCOPHAGE) 500 MG tablet; Take 1 tablet (500 mg total) by mouth 2 (two) times daily.  Dispense: 60 tablet; Refill: 0  2. Vitamin D deficiency Low Vitamin D level contributes to fatigue and are associated with obesity, breast, and colon cancer. We will refill prescription Vitamin D for 1 month. Dana Williams will follow-up for routine testing of Vitamin D, at least 2-3 times per year to avoid  over-replacement. We will recheck labs in 1 month.  - Vitamin D, Ergocalciferol, (DRISDOL) 1.25 MG (50000 UNIT) CAPS capsule; Take 1 capsule (50,000 Units total) by mouth every 7 (seven) days.  Dispense: 4 capsule; Refill: 0  3. At risk for impaired metabolic function Dana Williams was given approximately 15 minutes of impaired  metabolic function prevention counseling today. We discussed intensive lifestyle modifications today with an emphasis on specific nutrition and exercise instructions and strategies.   Repetitive spaced learning was employed today to elicit superior memory formation and behavioral change.  4. Class 2 severe obesity with serious comorbidity and body mass index (BMI) of 37.0 to 37.9 in adult, unspecified obesity type Dana Williams is currently in the action stage of change. As such, her goal is to continue with weight loss efforts. She has agreed to keeping a food journal and adhering to recommended goals of 1600-1800 calories and 100+ grams of protein daily.   Exercise goals: Dana Williams is to continue her exercise regimen as is.  Behavioral modification strategies: increasing lean protein intake and meal planning and cooking strategies.  Dana Williams has agreed to follow-up with our clinic in 2 weeks. She was informed of the importance of frequent follow-up visits to maximize her success with intensive lifestyle modifications for her multiple health conditions.   Objective:   Blood pressure 122/73, pulse (!) 110, temperature 98.4 F (36.9 C), temperature source Oral, height 5\' 5"  (1.651 m), weight 224 lb (101.6 kg), SpO2 97 %. Body mass index is 37.28 kg/m.  General: Cooperative, alert, well developed, in no acute  distress. HEENT: Conjunctivae and lids unremarkable. Cardiovascular: Regular rhythm.  Lungs: Normal work of breathing. Neurologic: No focal deficits.   Lab Results  Component Value Date   CREATININE 0.74 09/25/2019   BUN 12 09/25/2019   NA 142 09/25/2019   K 4.4  09/25/2019   CL 103 09/25/2019   CO2 25 09/25/2019   Lab Results  Component Value Date   ALT 19 09/25/2019   AST 20 09/25/2019   ALKPHOS 122 (H) 09/25/2019   BILITOT 0.3 09/25/2019   Lab Results  Component Value Date   HGBA1C 5.5 09/25/2019   HGBA1C 5.4 12/12/2018   HGBA1C 5.4 08/10/2018   Lab Results  Component Value Date   INSULIN 14.5 09/25/2019   INSULIN 11.5 12/12/2018   INSULIN 10.9 08/10/2018   Lab Results  Component Value Date   TSH 1.990 08/10/2018   Lab Results  Component Value Date   CHOL 203 (H) 09/25/2019   HDL 93 09/25/2019   LDLCALC 98 09/25/2019   TRIG 68 09/25/2019   Lab Results  Component Value Date   WBC 9.3 09/25/2019   HGB 12.5 09/25/2019   HCT 39.9 09/25/2019   MCV 89 09/25/2019   PLT 249 09/25/2019   No results found for: IRON, TIBC, FERRITIN  Attestation Statements:   Reviewed by clinician on day of visit: allergies, medications, problem list, medical history, surgical history, family history, social history, and previous encounter notes.    I, Trixie Dredge, am acting as transcriptionist for Dennard Nip, MD.  I have reviewed the above documentation for accuracy and completeness, and I agree with the above. -  Dennard Nip, MD

## 2020-01-29 ENCOUNTER — Ambulatory Visit (INDEPENDENT_AMBULATORY_CARE_PROVIDER_SITE_OTHER): Payer: 59 | Admitting: Physician Assistant

## 2020-01-29 MED FILL — SERTRALINE HCL 50 MG TABLET: 50 | 90 days supply | Qty: 90 | Fill #0

## 2020-01-29 MED FILL — VIT D2 1.25 MG (50,000 UNIT: 1.25 MG | 28 days supply | Qty: 4 | Fill #0

## 2020-01-29 MED FILL — METFORMIN HCL 500 MG TABS: 500 | 30 days supply | Qty: 60 | Fill #0

## 2020-01-30 MED FILL — AMOXICILLIN 500 MG CAPSULE: 500 | 4 days supply | Qty: 16 | Fill #0

## 2020-02-12 ENCOUNTER — Ambulatory Visit (INDEPENDENT_AMBULATORY_CARE_PROVIDER_SITE_OTHER): Payer: 59 | Admitting: Family Medicine

## 2020-02-12 ENCOUNTER — Ambulatory Visit (INDEPENDENT_AMBULATORY_CARE_PROVIDER_SITE_OTHER): Payer: 59 | Admitting: Physician Assistant

## 2020-02-12 ENCOUNTER — Encounter (INDEPENDENT_AMBULATORY_CARE_PROVIDER_SITE_OTHER): Payer: Self-pay

## 2020-03-01 MED FILL — AMMONIUM LACTATE 12% LOTION: 12 | 30 days supply | Qty: 800 | Fill #2

## 2020-03-20 DIAGNOSIS — D2261 Melanocytic nevi of right upper limb, including shoulder: Secondary | ICD-10-CM | POA: Diagnosis not present

## 2020-03-20 DIAGNOSIS — L518 Other erythema multiforme: Secondary | ICD-10-CM | POA: Diagnosis not present

## 2020-03-20 DIAGNOSIS — L400 Psoriasis vulgaris: Secondary | ICD-10-CM | POA: Diagnosis not present

## 2020-03-20 DIAGNOSIS — D225 Melanocytic nevi of trunk: Secondary | ICD-10-CM | POA: Diagnosis not present

## 2020-03-20 DIAGNOSIS — Z85828 Personal history of other malignant neoplasm of skin: Secondary | ICD-10-CM | POA: Diagnosis not present

## 2020-03-20 DIAGNOSIS — L821 Other seborrheic keratosis: Secondary | ICD-10-CM | POA: Diagnosis not present

## 2020-03-20 DIAGNOSIS — L820 Inflamed seborrheic keratosis: Secondary | ICD-10-CM | POA: Diagnosis not present

## 2020-03-20 DIAGNOSIS — L853 Xerosis cutis: Secondary | ICD-10-CM | POA: Diagnosis not present

## 2020-03-20 DIAGNOSIS — L718 Other rosacea: Secondary | ICD-10-CM | POA: Diagnosis not present

## 2020-03-20 MED FILL — AZELAIC ACID 15 % GEL: 15 | 30 days supply | Qty: 50 | Fill #0

## 2020-03-20 MED FILL — VALACYCLOVIR HCL 500 MG TAB: 500 | 90 days supply | Qty: 90 | Fill #0

## 2020-03-20 MED FILL — CALCIPOTRIENE-BETAMETH DIPR: 0.005-0.064 | 30 days supply | Qty: 120 | Fill #0

## 2020-05-02 MED FILL — ESOMEPRAZOLE MAG DR 40 MG C: 40 | 90 days supply | Qty: 90 | Fill #1

## 2020-05-02 MED FILL — ESTRADIOL-NORETHINDRONE ACE: 1-0.5 | 84 days supply | Qty: 84 | Fill #1

## 2020-05-02 MED FILL — ROSUVASTATIN CALCIUM 10 MG: 10 | 90 days supply | Qty: 90 | Fill #1

## 2020-05-05 ENCOUNTER — Ambulatory Visit (INDEPENDENT_AMBULATORY_CARE_PROVIDER_SITE_OTHER): Payer: 59 | Admitting: Family Medicine

## 2020-05-05 ENCOUNTER — Encounter (INDEPENDENT_AMBULATORY_CARE_PROVIDER_SITE_OTHER): Payer: Self-pay | Admitting: Family Medicine

## 2020-05-05 ENCOUNTER — Other Ambulatory Visit: Payer: Self-pay

## 2020-05-05 VITALS — BP 123/85 | HR 95 | Temp 97.8°F | Ht 65.0 in | Wt 235.0 lb

## 2020-05-05 DIAGNOSIS — Z6839 Body mass index (BMI) 39.0-39.9, adult: Secondary | ICD-10-CM | POA: Diagnosis not present

## 2020-05-05 DIAGNOSIS — E559 Vitamin D deficiency, unspecified: Secondary | ICD-10-CM | POA: Diagnosis not present

## 2020-05-05 DIAGNOSIS — E8881 Metabolic syndrome: Secondary | ICD-10-CM

## 2020-05-05 DIAGNOSIS — Z9189 Other specified personal risk factors, not elsewhere classified: Secondary | ICD-10-CM | POA: Diagnosis not present

## 2020-05-05 MED ORDER — VITAMIN D (ERGOCALCIFEROL) 1.25 MG (50000 UNIT) PO CAPS: 50000.0000 [IU] | ORAL_CAPSULE | ORAL | 0 refills | Status: DC

## 2020-05-05 MED FILL — VIT D2 1.25 MG (50,000 UNIT: 1.25 MG | 28 days supply | Qty: 4 | Fill #0

## 2020-05-07 NOTE — Progress Notes (Signed)
Chief Complaint:   OBESITY Dana Williams is here to discuss her progress with her obesity treatment plan along with follow-up of her obesity related diagnoses. Dana Williams is on keeping a food journal and adhering to recommended goals of 1600-1800 calories and 100+ grams of protein daily and states she is following her eating plan approximately 0% of the time. Dana Williams states she is doing 0 minutes 0 times per week.  Today's visit was #: 22 Starting weight: 236 lbs Starting date: 08/10/2018 Today's weight: 235 lbs Today's date: 05/05/2020 Total lbs lost to date: 1 Total lbs lost since last in-office visit: 0  Interim History: Dana Williams feels she got off track when journaling and she notes that she has put on some weight. She works 12 hours 4 days per week and meal planning is difficult due to lack of time.  Subjective:   1. Vitamin D deficiency Dana Williams's last Vit D was low at 48.7. She has been off of Vit D supplementation.  2. Insulin resistance Dana Williams has a diagnosis of insulin resistance based on her elevated fasting insulin level >5. Dana Williams is not on metformin, and she denies polyphagia.  She continues to work on diet and exercise to decrease her risk of diabetes.  Lab Results  Component Value Date   INSULIN 14.5 09/25/2019   INSULIN 11.5 12/12/2018   INSULIN 10.9 08/10/2018   Lab Results  Component Value Date   HGBA1C 5.5 09/25/2019   3. At risk for diabetes mellitus Dana Williams is at higher than average risk for developing diabetes due to her obesity.   Assessment/Plan:   1. Vitamin D deficiency Low Vitamin D level contributes to fatigue and are associated with obesity, breast, and colon cancer. We will refill prescription Vitamin D for 1 month. Dana Williams will follow-up for routine testing of Vitamin D, at least 2-3 times per year to avoid over-replacement. We check labs at her next office visit.  - Vitamin D, Ergocalciferol, (DRISDOL) 1.25 MG (50000 UNIT) CAPS capsule; Take 1 capsule (50,000 Units  total) by mouth every 7 (seven) days.  Dispense: 4 capsule; Refill: 0  2. Insulin resistance Dana Williams will start back on the meal plan, and will continue to work on weight loss, exercise, and decreasing simple carbohydrates to help decrease the risk of diabetes. We will recheck labs at her next office visit. Dana Williams agreed to follow-up with [name] as directed to closely monitor her progress.  3. At risk for diabetes mellitus Dana Williams was given approximately 15 minutes of diabetes education and counseling today. We discussed intensive lifestyle modifications today with an emphasis on weight loss as well as increasing exercise and decreasing simple carbohydrates in her diet. We also reviewed medication options with an emphasis on risk versus benefit of those discussed.   Repetitive spaced learning was employed today to elicit superior memory formation and behavioral change.  4. Class 2 severe obesity with serious comorbidity and body mass index (BMI) of 39.0 to 39.9 in adult, unspecified obesity type [facility]) Dana Williams is currently in the action stage of change. As such, her goal is to continue with weight loss efforts. She has agreed to the Category 3 Plan and keeping a food journal and adhering to recommended goals of 450-600 calories and 40 grams of protein at supper daily.   Handout given today: Category 3 meal plan.  Exercise goals: No exercise has been prescribed at this time.  Behavioral modification strategies: increasing lean protein intake, decreasing simple carbohydrates and meal planning and cooking strategies.  Dana Williams has agreed to follow-up with our clinic in 3 weeks. She was informed of the importance of frequent follow-up visits to maximize her success with intensive lifestyle modifications for her multiple health conditions.   Objective:   Blood pressure 123/85, pulse 95, temperature 97.8 F (36.6 C), temperature source Oral, height 5\' 5"  (1.651 m), weight 235 lb (106.6 kg), SpO2 97 %. Body mass  index is 39.11 kg/m.  General: Cooperative, alert, well developed, in no acute distress. HEENT: Conjunctivae and lids unremarkable. Cardiovascular: Regular rhythm.  Lungs: Normal work of breathing. Neurologic: No focal deficits.   Lab Results  Component Value Date   CREATININE 0.74 09/25/2019   BUN 12 09/25/2019   NA 142 09/25/2019   K 4.4 09/25/2019   CL 103 09/25/2019   CO2 25 09/25/2019   Lab Results  Component Value Date   ALT 19 09/25/2019   AST 20 09/25/2019   ALKPHOS 122 (H) 09/25/2019   BILITOT 0.3 09/25/2019   Lab Results  Component Value Date   HGBA1C 5.5 09/25/2019   HGBA1C 5.4 12/12/2018   HGBA1C 5.4 08/10/2018   Lab Results  Component Value Date   INSULIN 14.5 09/25/2019   INSULIN 11.5 12/12/2018   INSULIN 10.9 08/10/2018   Lab Results  Component Value Date   TSH 1.990 08/10/2018   Lab Results  Component Value Date   CHOL 203 (H) 09/25/2019   HDL 93 09/25/2019   LDLCALC 98 09/25/2019   TRIG 68 09/25/2019   Lab Results  Component Value Date   WBC 9.3 09/25/2019   HGB 12.5 09/25/2019   HCT 39.9 09/25/2019   MCV 89 09/25/2019   PLT 249 09/25/2019   No results found for: IRON, TIBC, FERRITIN  Attestation Statements:   Reviewed by clinician on day of visit: allergies, medications, problem list, medical history, surgical history, family history, social history, and previous encounter notes.   Wilhemena Durie, am acting as Location manager for [employer], FNP-C.  I have reviewed the above documentation for accuracy and completeness, and I agree with the above. -  Georgianne Fick, FNP

## 2020-05-12 ENCOUNTER — Encounter (INDEPENDENT_AMBULATORY_CARE_PROVIDER_SITE_OTHER): Payer: Self-pay | Admitting: Family Medicine

## 2020-05-12 DIAGNOSIS — E88819 Insulin resistance, unspecified: Secondary | ICD-10-CM | POA: Insufficient documentation

## 2020-05-12 DIAGNOSIS — E8881 Metabolic syndrome: Secondary | ICD-10-CM | POA: Insufficient documentation

## 2020-05-28 ENCOUNTER — Ambulatory Visit (INDEPENDENT_AMBULATORY_CARE_PROVIDER_SITE_OTHER): Payer: 59 | Admitting: Family Medicine

## 2020-05-28 ENCOUNTER — Other Ambulatory Visit: Payer: Self-pay

## 2020-05-28 ENCOUNTER — Encounter (INDEPENDENT_AMBULATORY_CARE_PROVIDER_SITE_OTHER): Payer: Self-pay | Admitting: Family Medicine

## 2020-05-28 VITALS — BP 117/74 | HR 103 | Temp 98.3°F | Ht 65.0 in | Wt 235.0 lb

## 2020-05-28 DIAGNOSIS — E7849 Other hyperlipidemia: Secondary | ICD-10-CM | POA: Diagnosis not present

## 2020-05-28 DIAGNOSIS — Z6839 Body mass index (BMI) 39.0-39.9, adult: Secondary | ICD-10-CM

## 2020-05-28 DIAGNOSIS — R5383 Other fatigue: Secondary | ICD-10-CM

## 2020-05-28 DIAGNOSIS — Z9189 Other specified personal risk factors, not elsewhere classified: Secondary | ICD-10-CM

## 2020-05-28 DIAGNOSIS — E559 Vitamin D deficiency, unspecified: Secondary | ICD-10-CM

## 2020-05-28 DIAGNOSIS — E88819 Insulin resistance, unspecified: Secondary | ICD-10-CM

## 2020-05-28 DIAGNOSIS — E8881 Metabolic syndrome: Secondary | ICD-10-CM

## 2020-05-28 MED ORDER — VITAMIN D (ERGOCALCIFEROL) 1.25 MG (50000 UNIT) PO CAPS: 50000.0000 [IU] | ORAL_CAPSULE | ORAL | 0 refills | Status: DC

## 2020-05-29 LAB — COMPREHENSIVE METABOLIC PANEL
ALT: 14 IU/L (ref 0–32)
AST: 15 IU/L (ref 0–40)
Albumin/Globulin Ratio: 1.8 (ref 1.2–2.2)
Albumin: 4.2 g/dL (ref 3.8–4.9)
Alkaline Phosphatase: 123 IU/L — ABNORMAL HIGH (ref 48–121)
BUN/Creatinine Ratio: 20 (ref 12–28)
BUN: 14 mg/dL (ref 8–27)
Bilirubin Total: 0.3 mg/dL (ref 0.0–1.2)
CO2: 24 mmol/L (ref 20–29)
Calcium: 9.9 mg/dL (ref 8.7–10.3)
Chloride: 104 mmol/L (ref 96–106)
Creatinine, Ser: 0.69 mg/dL (ref 0.57–1.00)
GFR calc Af Amer: 109 mL/min/{1.73_m2} (ref >59)
GFR calc non Af Amer: 95 mL/min/{1.73_m2} (ref >59)
Globulin, Total: 2.4 g/dL (ref 1.5–4.5)
Glucose: 104 mg/dL — ABNORMAL HIGH (ref 65–99)
Potassium: 4.8 mmol/L (ref 3.5–5.2)
Sodium: 143 mmol/L (ref 134–144)
Total Protein: 6.6 g/dL (ref 6.0–8.5)

## 2020-05-29 LAB — LIPID PANEL WITH LDL/HDL RATIO
Cholesterol, Total: 176 mg/dL (ref 100–199)
HDL: 84 mg/dL (ref >39)
LDL Chol Calc (NIH): 75 mg/dL (ref 0–99)
LDL/HDL Ratio: 0.9 ratio (ref 0.0–3.2)
Triglycerides: 94 mg/dL (ref 0–149)
VLDL Cholesterol Cal: 17 mg/dL (ref 5–40)

## 2020-05-29 LAB — T4, FREE: Free T4: 0.96 ng/dL (ref 0.82–1.77)

## 2020-05-29 LAB — INSULIN, RANDOM: INSULIN: 19.4 u[IU]/mL (ref 2.6–24.9)

## 2020-05-29 LAB — HEMOGLOBIN A1C
Est. average glucose Bld gHb Est-mCnc: 117 mg/dL
Hgb A1c MFr Bld: 5.7 % — ABNORMAL HIGH (ref 4.8–5.6)

## 2020-05-29 LAB — TSH: TSH: 1.92 u[IU]/mL (ref 0.450–4.500)

## 2020-05-29 LAB — T3: T3, Total: 146 ng/dL (ref 71–180)

## 2020-05-29 LAB — VITAMIN D 25 HYDROXY (VIT D DEFICIENCY, FRACTURES): Vit D, 25-Hydroxy: 41 ng/mL (ref 30.0–100.0)

## 2020-05-29 NOTE — Progress Notes (Signed)
Chief Complaint:   OBESITY Dana Williams is here to discuss her progress with her obesity treatment plan along with follow-up of her obesity related diagnoses. Dana Williams is on the Category 3 Plan and keeping a food journal and adhering to recommended goals of 450-600 calories and 40 grams of protein at supper daily and states she is following her eating plan approximately 30% of the time. Dana Williams states she is doing 0 minutes 0 times per week.  Today's visit was #: 23 Starting weight: 236 lbs Starting date: 08/10/2018 Today's weight: 235 lbs Today's date: 05/28/2020 Total lbs lost to date: 1 Total lbs lost since last in-office visit: 0  Interim History: Dana Williams has been stressed with work and she has done well maintaining her weight but she feels she is ready to get back on track. She notes meal planning seems especially difficult.  Subjective:   1. Vitamin D deficiency Dana Williams is stable on Vit D and she is due for labs.  2. Insulin resistance Dana Williams is due for labs, and she notes increased polyphagia.  3. Other hyperlipidemia Dana Williams is due for labs , and she denies chest pain.  4. Other fatigue Dana Williams notes increased fatigue and she wonders if this mat be related to thyroid.  5. At risk for heart disease Dana Williams is at a higher than average risk for cardiovascular disease due to obesity.   Assessment/Plan:   1. Vitamin D deficiency Low Vitamin D level contributes to fatigue and are associated with obesity, breast, and colon cancer. We will check labs today, and we will refill prescription Vitamin D for 1 month. Emmagrace will follow-up for routine testing of Vitamin D, at least 2-3 times per year to avoid over-replacement.  - VITAMIN D 25 Hydroxy (Vit-D Deficiency, Fractures) - Vitamin D, Ergocalciferol, (DRISDOL) 1.25 MG (50000 UNIT) CAPS capsule; Take 1 capsule (50,000 Units total) by mouth every 7 (seven) days.  Dispense: 4 capsule; Refill: 0  2. Insulin resistance Dana Williams will continue to work  on weight loss, exercise, and decreasing simple carbohydrates to help decrease the risk of diabetes. We will check labs today. Dana Williams agreed to follow-up with [name] as directed to closely monitor her progress.  - Comprehensive metabolic panel - Hemoglobin A1c - Insulin, random  3. Other hyperlipidemia Cardiovascular risk and specific lipid/LDL goals reviewed.We discussed several lifestyle modifications today and Dana Williams will continue to work on diet, exercise and weight loss efforts. We will check labs today. Orders and follow up as documented in patient record.   - Comprehensive metabolic panel - Lipid Panel With LDL/HDL Ratio  4. Other fatigue We will check thyroid panel today, and Dana Williams will follow as directed. - T3 - T4, free - TSH  5. At risk for heart disease Dana Williams was given approximately 30 minutes of coronary artery disease prevention counseling today. She is 59 y.o. female and has risk factors for heart disease including obesity. We discussed intensive lifestyle modifications today with an emphasis on specific weight loss instructions and strategies.   Repetitive spaced learning was employed today to elicit superior memory formation and behavioral change.  6. Class 2 severe obesity with serious comorbidity and body mass index (BMI) of 39.0 to 39.9 in adult, unspecified obesity type [facility]) Dana Williams is currently in the action stage of change. As such, her goal is to continue with weight loss efforts. She has agreed to the Category 3 Plan and keeping a food journal and adhering to recommended goals of 400-600 calories and 40+ grams of  protein at supper daily.   Behavioral modification strategies: meal planning and cooking strategies.  Dana Williams has agreed to follow-up with our clinic in 2 to 3 weeks. She was informed of the importance of frequent follow-up visits to maximize her success with intensive lifestyle modifications for her multiple health conditions.   Dana Williams was informed we would  discuss her lab results at her next visit unless there is a critical issue that needs to be addressed sooner. Dana Williams agreed to keep her next visit at the agreed upon time to discuss these results.  Objective:   Blood pressure 117/74, pulse (!) 103, temperature 98.3 F (36.8 C), temperature source Oral, height 5\' 5"  (1.651 m), weight 235 lb (106.6 kg), SpO2 98 %. Body mass index is 39.11 kg/m.  General: Cooperative, alert, well developed, in no acute distress. HEENT: Conjunctivae and lids unremarkable. Cardiovascular: Regular rhythm.  Lungs: Normal work of breathing. Neurologic: No focal deficits.   Lab Results  Component Value Date   CREATININE 0.69 05/28/2020   BUN 14 05/28/2020   NA 143 05/28/2020   K 4.8 05/28/2020   CL 104 05/28/2020   CO2 24 05/28/2020   Lab Results  Component Value Date   ALT 14 05/28/2020   AST 15 05/28/2020   ALKPHOS 123 (H) 05/28/2020   BILITOT 0.3 05/28/2020   Lab Results  Component Value Date   HGBA1C 5.7 (H) 05/28/2020   HGBA1C 5.5 09/25/2019   HGBA1C 5.4 12/12/2018   HGBA1C 5.4 08/10/2018   Lab Results  Component Value Date   INSULIN 19.4 05/28/2020   INSULIN 14.5 09/25/2019   INSULIN 11.5 12/12/2018   INSULIN 10.9 08/10/2018   Lab Results  Component Value Date   TSH 1.920 05/28/2020   Lab Results  Component Value Date   CHOL 176 05/28/2020   HDL 84 05/28/2020   LDLCALC 75 05/28/2020   TRIG 94 05/28/2020   Lab Results  Component Value Date   WBC 9.3 09/25/2019   HGB 12.5 09/25/2019   HCT 39.9 09/25/2019   MCV 89 09/25/2019   PLT 249 09/25/2019   No results found for: IRON, TIBC, FERRITIN  Attestation Statements:   Reviewed by clinician on day of visit: allergies, medications, problem list, medical history, surgical history, family history, social history, and previous encounter notes.   I, Trixie Dredge, am acting as transcriptionist for Dennard Nip, MD.  I have reviewed the above documentation for accuracy and  completeness, and I agree with the above. -  Dennard Nip, MD

## 2020-06-09 ENCOUNTER — Encounter (INDEPENDENT_AMBULATORY_CARE_PROVIDER_SITE_OTHER): Payer: Self-pay | Admitting: Family Medicine

## 2020-06-10 NOTE — Telephone Encounter (Signed)
Please advise 

## 2020-06-12 MED FILL — VIT D2 1.25 MG (50,000 UNIT: 1.25 MG | 28 days supply | Qty: 4 | Fill #0

## 2020-06-16 ENCOUNTER — Ambulatory Visit (INDEPENDENT_AMBULATORY_CARE_PROVIDER_SITE_OTHER): Payer: 59 | Admitting: Family Medicine

## 2020-06-30 ENCOUNTER — Ambulatory Visit (INDEPENDENT_AMBULATORY_CARE_PROVIDER_SITE_OTHER): Payer: 59 | Admitting: Family Medicine

## 2020-07-10 ENCOUNTER — Ambulatory Visit (INDEPENDENT_AMBULATORY_CARE_PROVIDER_SITE_OTHER): Payer: 59 | Admitting: Family Medicine

## 2020-07-10 ENCOUNTER — Other Ambulatory Visit: Payer: Self-pay

## 2020-07-10 ENCOUNTER — Encounter (INDEPENDENT_AMBULATORY_CARE_PROVIDER_SITE_OTHER): Payer: Self-pay | Admitting: Family Medicine

## 2020-07-10 VITALS — BP 130/81 | HR 100 | Temp 98.0°F | Ht 65.0 in | Wt 238.0 lb

## 2020-07-10 DIAGNOSIS — Z9189 Other specified personal risk factors, not elsewhere classified: Secondary | ICD-10-CM | POA: Diagnosis not present

## 2020-07-10 DIAGNOSIS — Z6839 Body mass index (BMI) 39.0-39.9, adult: Secondary | ICD-10-CM

## 2020-07-10 DIAGNOSIS — Z1231 Encounter for screening mammogram for malignant neoplasm of breast: Secondary | ICD-10-CM | POA: Diagnosis not present

## 2020-07-10 DIAGNOSIS — E559 Vitamin D deficiency, unspecified: Secondary | ICD-10-CM

## 2020-07-10 DIAGNOSIS — Z01419 Encounter for gynecological examination (general) (routine) without abnormal findings: Secondary | ICD-10-CM | POA: Diagnosis not present

## 2020-07-10 DIAGNOSIS — Z6841 Body Mass Index (BMI) 40.0 and over, adult: Secondary | ICD-10-CM | POA: Diagnosis not present

## 2020-07-10 DIAGNOSIS — N952 Postmenopausal atrophic vaginitis: Secondary | ICD-10-CM | POA: Diagnosis not present

## 2020-07-10 MED ORDER — VITAMIN D (ERGOCALCIFEROL) 1.25 MG (50000 UNIT) PO CAPS: 50000.0000 [IU] | ORAL_CAPSULE | ORAL | 0 refills | Status: DC

## 2020-07-10 MED FILL — VIT D2 1.25 MG (50,000 UNIT: 1.25 MG | 28 days supply | Qty: 4 | Fill #0

## 2020-07-10 NOTE — Progress Notes (Signed)
Chief Complaint:   OBESITY Dana Williams is here to discuss her progress with her obesity treatment plan along with follow-up of her obesity related diagnoses. Dana Williams is on the Category 3 Plan and keeping a food journal and adhering to recommended goals of 400-600 calories and 40+ grams of protein at supper daily and states she is following her eating plan approximately 30-40% of the time. Dana Williams states she is doing 0 minutes 0 times per week.  Today's visit was #: 24 Starting weight: 236 lbs Starting date: 08/10/2018 Today's weight: 238 lbs Today's date: 07/10/2020 Total lbs lost to date: 0 Total lbs lost since last in-office visit: 0  Interim History: Dana Williams has been on vacation and she has done more celebration eating. She is working long hours with increased stress and she has tried to portion control and be mindful but she hasn't been able to meal plan as well.  Subjective:   1. Vitamin D deficiency Dana Williams stable on Vit D, but her level is not yet at goal.  2. At risk for heart disease Dana Williams is at a higher than average risk for cardiovascular disease due to obesity.   Assessment/Plan:   1. Vitamin D deficiency Low Vitamin D level contributes to fatigue and are associated with obesity, breast, and colon cancer. We will refill prescription Vitamin D for 1 month. Nazirah will follow-up for routine testing of Vitamin D, at least 2-3 times per year to avoid over-replacement.  - Vitamin D, Ergocalciferol, (DRISDOL) 1.25 MG (50000 UNIT) CAPS capsule; Take 1 capsule (50,000 Units total) by mouth every 7 (seven) days.  Dispense: 4 capsule; Refill: 0  2. At risk for heart disease Dana Williams was given approximately 15 minutes of coronary artery disease prevention counseling today. She is 59 y.o. female and has risk factors for heart disease including obesity. We discussed intensive lifestyle modifications today with an emphasis on specific weight loss instructions and strategies.   Repetitive spaced  learning was employed today to elicit superior memory formation and behavioral change.  3. Class 2 severe obesity with serious comorbidity and body mass index (BMI) of 39.0 to 39.9 in adult, unspecified obesity type [facility] Williams) Dana Williams is currently in the action stage of change. As such, her goal is to continue with weight loss efforts. She has agreed to the Category 3 Plan and keeping a food journal and adhering to recommended goals of 400-600 calories and 40+ grams of protein at supper daily.   Behavioral modification strategies: increasing lean protein intake and meal planning and cooking strategies.  Dana Williams has agreed to follow-up with our clinic in 2 to 3 weeks. She was informed of the importance of frequent follow-up visits to maximize her success with intensive lifestyle modifications for her multiple health conditions.   Objective:   Blood pressure 130/81, pulse 100, temperature 98 F (36.7 C), temperature source Oral, height 5\' 5"  (1.651 m), weight 238 lb (108 kg), SpO2 96 %. Body mass index is 39.61 kg/m.  General: Cooperative, alert, well developed, in no acute distress. HEENT: Conjunctivae and lids unremarkable. Cardiovascular: Regular rhythm.  Lungs: Normal work of breathing. Neurologic: No focal deficits.   Lab Results  Component Value Date   CREATININE 0.69 05/28/2020   BUN 14 05/28/2020   NA 143 05/28/2020   K 4.8 05/28/2020   CL 104 05/28/2020   CO2 24 05/28/2020   Lab Results  Component Value Date   ALT 14 05/28/2020   AST 15 05/28/2020   ALKPHOS 123 (H) 05/28/2020  BILITOT 0.3 05/28/2020   Lab Results  Component Value Date   HGBA1C 5.7 (H) 05/28/2020   HGBA1C 5.5 09/25/2019   HGBA1C 5.4 12/12/2018   HGBA1C 5.4 08/10/2018   Lab Results  Component Value Date   INSULIN 19.4 05/28/2020   INSULIN 14.5 09/25/2019   INSULIN 11.5 12/12/2018   INSULIN 10.9 08/10/2018   Lab Results  Component Value Date   TSH 1.920 05/28/2020   Lab Results  Component Value  Date   CHOL 176 05/28/2020   HDL 84 05/28/2020   LDLCALC 75 05/28/2020   TRIG 94 05/28/2020   Lab Results  Component Value Date   WBC 9.3 09/25/2019   HGB 12.5 09/25/2019   HCT 39.9 09/25/2019   MCV 89 09/25/2019   PLT 249 09/25/2019   No results found for: IRON, TIBC, FERRITIN  Attestation Statements:   Reviewed by clinician on day of visit: allergies, medications, problem list, medical history, surgical history, family history, social history, and previous encounter notes.   I, Trixie Dredge, am acting as transcriptionist for Dennard Nip, MD.  I have reviewed the above documentation for accuracy and completeness, and I agree with the above. -  Dennard Nip, MD

## 2020-07-29 ENCOUNTER — Ambulatory Visit (INDEPENDENT_AMBULATORY_CARE_PROVIDER_SITE_OTHER): Payer: 59 | Admitting: Family Medicine

## 2020-07-29 DIAGNOSIS — N907 Vulvar cyst: Secondary | ICD-10-CM | POA: Diagnosis not present

## 2020-07-29 DIAGNOSIS — D280 Benign neoplasm of vulva: Secondary | ICD-10-CM | POA: Diagnosis not present

## 2020-08-07 ENCOUNTER — Other Ambulatory Visit (HOSPITAL_COMMUNITY): Payer: Self-pay | Admitting: Dermatology

## 2020-08-07 MED FILL — ROSUVASTATIN CALCIUM 10 MG: 10 | 90 days supply | Qty: 90 | Fill #2

## 2020-08-07 MED FILL — AMMONIUM LACTATE 12% LOTION: 12 | 30 days supply | Qty: 400 | Fill #0

## 2020-08-07 MED FILL — DOXYCYCLINE HYCLATE 100 MG: 100 | 15 days supply | Qty: 30 | Fill #0

## 2020-08-07 MED FILL — ESOMEPRAZOLE MAG DR 40 MG C: 40 | 90 days supply | Qty: 90 | Fill #2

## 2020-08-11 ENCOUNTER — Ambulatory Visit (INDEPENDENT_AMBULATORY_CARE_PROVIDER_SITE_OTHER): Payer: 59 | Admitting: Family Medicine

## 2020-08-15 MED FILL — SERTRALINE HCL 50 MG TABLET: 50 | 90 days supply | Qty: 90 | Fill #1

## 2020-09-18 DIAGNOSIS — H524 Presbyopia: Secondary | ICD-10-CM | POA: Diagnosis not present

## 2020-09-18 DIAGNOSIS — H5211 Myopia, right eye: Secondary | ICD-10-CM | POA: Diagnosis not present

## 2020-09-18 DIAGNOSIS — H2513 Age-related nuclear cataract, bilateral: Secondary | ICD-10-CM | POA: Diagnosis not present

## 2020-09-18 DIAGNOSIS — H25813 Combined forms of age-related cataract, bilateral: Secondary | ICD-10-CM | POA: Diagnosis not present

## 2020-09-18 DIAGNOSIS — H52223 Regular astigmatism, bilateral: Secondary | ICD-10-CM | POA: Diagnosis not present

## 2020-11-24 ENCOUNTER — Other Ambulatory Visit (HOSPITAL_COMMUNITY): Payer: Self-pay | Admitting: Dermatology

## 2020-11-24 MED FILL — SERTRALINE HCL 50 MG TABS: 50 | 90 days supply | Qty: 90 | Fill #2

## 2020-11-24 MED FILL — ESOMEPRAZOLE MAG DR 40 MG C: 40 | 90 days supply | Qty: 90 | Fill #3

## 2020-11-24 MED FILL — ROSUVASTATIN CALCIUM 10 MG: 10 | 90 days supply | Qty: 90 | Fill #3

## 2020-11-24 MED FILL — AMMONIUM LACTATE 12% LOTION: 12 | 60 days supply | Qty: 800 | Fill #0

## 2020-12-29 ENCOUNTER — Ambulatory Visit (INDEPENDENT_AMBULATORY_CARE_PROVIDER_SITE_OTHER): Payer: 59 | Admitting: Family Medicine

## 2020-12-29 ENCOUNTER — Encounter (INDEPENDENT_AMBULATORY_CARE_PROVIDER_SITE_OTHER): Payer: Self-pay

## 2021-01-27 ENCOUNTER — Ambulatory Visit (INDEPENDENT_AMBULATORY_CARE_PROVIDER_SITE_OTHER): Payer: 59 | Admitting: Family Medicine

## 2021-01-27 ENCOUNTER — Other Ambulatory Visit: Payer: Self-pay

## 2021-01-27 ENCOUNTER — Other Ambulatory Visit (HOSPITAL_COMMUNITY): Payer: Self-pay | Admitting: Family Medicine

## 2021-01-27 ENCOUNTER — Encounter (INDEPENDENT_AMBULATORY_CARE_PROVIDER_SITE_OTHER): Payer: Self-pay | Admitting: Family Medicine

## 2021-01-27 VITALS — BP 127/82 | HR 98 | Temp 98.0°F | Ht 65.0 in | Wt 236.0 lb

## 2021-01-27 DIAGNOSIS — Z6839 Body mass index (BMI) 39.0-39.9, adult: Secondary | ICD-10-CM

## 2021-01-27 DIAGNOSIS — F3289 Other specified depressive episodes: Secondary | ICD-10-CM | POA: Diagnosis not present

## 2021-01-27 DIAGNOSIS — E559 Vitamin D deficiency, unspecified: Secondary | ICD-10-CM | POA: Diagnosis not present

## 2021-01-27 DIAGNOSIS — Z9189 Other specified personal risk factors, not elsewhere classified: Secondary | ICD-10-CM | POA: Diagnosis not present

## 2021-01-27 MED ORDER — BUPROPION HCL ER (XL) 150 MG PO TB24: 150.0000 mg | ORAL_TABLET | Freq: Every morning | ORAL | 0 refills | Status: DC

## 2021-01-27 MED ORDER — VITAMIN D (ERGOCALCIFEROL) 1.25 MG (50000 UNIT) PO CAPS: 50000.0000 [IU] | ORAL_CAPSULE | ORAL | 0 refills | Status: DC

## 2021-02-02 NOTE — Progress Notes (Signed)
Chief Complaint:   OBESITY Dana Williams is here to discuss her progress with her obesity treatment plan along with follow-up of her obesity related diagnoses. Dana Williams is on the Category 3 Plan and keeping a food journal and adhering to recommended goals of 400-600 calories and 40+ grams of protein at supper daily and states she is following her eating plan approximately 0% of the time. Dana Williams states she is doing 0 minutes 0 times per week.  Today's visit was #: 25 Starting weight: 236 lbs Starting date: 08/10/2018 Today's weight: 236 lbs Today's date: 01/27/2021 Total lbs lost to date: 0 Total lbs lost since last in-office visit: 2  Interim History: Dana Williams's last visit was approximately 6 months ago. She has done well avoiding weight loss again, but is ready to get back on track.  Subjective:   1. Vitamin D deficiency Raneisha has been out of Vit D and her level is highly likely to be well below goal.  2. Other depression, with emotional eating Dana Williams has had increased stress in the last 2 years at work and she is doing more with less and less staff. She is ready to start concentrating on her our healthy more. She is struggling with increased emotional eating.  3. At risk for heart disease Dana Williams is at a higher than average risk for cardiovascular disease due to obesity.   Assessment/Plan:   1. Vitamin D deficiency Low Vitamin D level contributes to fatigue and are associated with obesity, breast, and colon cancer. We will refill prescription Vitamin D for 1 month. Dana Williams will follow-up for routine testing of Vitamin D, at least 2-3 times per year to avoid over-replacement.  - Vitamin D, Ergocalciferol, (DRISDOL) 1.25 MG (50000 UNIT) CAPS capsule; Take 1 capsule (50,000 Units total) by mouth every 7 (seven) days.  Dispense: 4 capsule; Refill: 0  2. Other depression, with emotional eating Behavior modification techniques were discussed today to help Dana Williams deal with her emotional/non-hunger  eating behaviors. Dana Williams agreed to start Wellbutrin SR 150 mg q AM with no refills. Orders and follow up as documented in patient record.   - buPROPion (WELLBUTRIN XL) 150 MG 24 hr tablet; Take 1 tablet (150 mg total) by mouth in the morning.  Dispense: 30 tablet; Refill: 0  3. At risk for heart disease Dana Williams was given approximately 15 minutes of coronary artery disease prevention counseling today. She is 60 y.o. female and has risk factors for heart disease including obesity. We discussed intensive lifestyle modifications today with an emphasis on specific weight loss instructions and strategies.   Repetitive spaced learning was employed today to elicit superior memory formation and behavioral change.  4. Class 2 severe obesity with serious comorbidity and body mass index (BMI) of 39.0 to 39.9 in adult, unspecified obesity type [facility]) Dana Williams is currently in the action stage of change. As such, her goal is to continue with weight loss efforts. She has agreed to restart the Category 3 Plan.   Behavioral modification strategies: increasing lean protein intake.  Dana Williams has agreed to follow-up with our clinic in 2 weeks. She was informed of the importance of frequent follow-up visits to maximize her success with intensive lifestyle modifications for her multiple health conditions.   Objective:   Blood pressure 127/82, pulse 98, temperature 98 F (36.7 C), height 5\' 5"  (1.651 m), weight 236 lb (107 kg), SpO2 95 %. Body mass index is 39.27 kg/m.  General: Cooperative, alert, well developed, in no acute distress. HEENT: Conjunctivae and  lids unremarkable. Cardiovascular: Regular rhythm.  Lungs: Normal work of breathing. Neurologic: No focal deficits.   Lab Results  Component Value Date   CREATININE 0.69 05/28/2020   BUN 14 05/28/2020   NA 143 05/28/2020   K 4.8 05/28/2020   CL 104 05/28/2020   CO2 24 05/28/2020   Lab Results  Component Value Date   ALT 14 05/28/2020   AST 15 05/28/2020    ALKPHOS 123 (H) 05/28/2020   BILITOT 0.3 05/28/2020   Lab Results  Component Value Date   HGBA1C 5.7 (H) 05/28/2020   HGBA1C 5.5 09/25/2019   HGBA1C 5.4 12/12/2018   HGBA1C 5.4 08/10/2018   Lab Results  Component Value Date   INSULIN 19.4 05/28/2020   INSULIN 14.5 09/25/2019   INSULIN 11.5 12/12/2018   INSULIN 10.9 08/10/2018   Lab Results  Component Value Date   TSH 1.920 05/28/2020   Lab Results  Component Value Date   CHOL 176 05/28/2020   HDL 84 05/28/2020   LDLCALC 75 05/28/2020   TRIG 94 05/28/2020   Lab Results  Component Value Date   WBC 9.3 09/25/2019   HGB 12.5 09/25/2019   HCT 39.9 09/25/2019   MCV 89 09/25/2019   PLT 249 09/25/2019   No results found for: IRON, TIBC, FERRITIN  Attestation Statements:   Reviewed by clinician on day of visit: allergies, medications, problem list, medical history, surgical history, family history, social history, and previous encounter notes.   I, Trixie Dredge, am acting as transcriptionist for Dennard Nip, MD.  I have reviewed the above documentation for accuracy and completeness, and I agree with the above. -  Dennard Nip, MD

## 2021-02-05 ENCOUNTER — Other Ambulatory Visit (HOSPITAL_BASED_OUTPATIENT_CLINIC_OR_DEPARTMENT_OTHER): Payer: Self-pay

## 2021-02-09 ENCOUNTER — Ambulatory Visit (INDEPENDENT_AMBULATORY_CARE_PROVIDER_SITE_OTHER): Payer: 59 | Admitting: Family Medicine

## 2021-02-09 ENCOUNTER — Other Ambulatory Visit: Payer: Self-pay

## 2021-02-09 ENCOUNTER — Other Ambulatory Visit (HOSPITAL_COMMUNITY): Payer: Self-pay | Admitting: Family Medicine

## 2021-02-09 ENCOUNTER — Encounter (INDEPENDENT_AMBULATORY_CARE_PROVIDER_SITE_OTHER): Payer: Self-pay | Admitting: Family Medicine

## 2021-02-09 VITALS — BP 122/81 | HR 86 | Temp 97.4°F | Ht 65.0 in | Wt 236.0 lb

## 2021-02-09 DIAGNOSIS — F3289 Other specified depressive episodes: Secondary | ICD-10-CM

## 2021-02-09 DIAGNOSIS — Z6839 Body mass index (BMI) 39.0-39.9, adult: Secondary | ICD-10-CM

## 2021-02-09 DIAGNOSIS — R7303 Prediabetes: Secondary | ICD-10-CM

## 2021-02-09 DIAGNOSIS — E559 Vitamin D deficiency, unspecified: Secondary | ICD-10-CM | POA: Diagnosis not present

## 2021-02-09 DIAGNOSIS — Z9189 Other specified personal risk factors, not elsewhere classified: Secondary | ICD-10-CM

## 2021-02-09 MED ORDER — METFORMIN HCL 500 MG PO TABS: 500.0000 mg | ORAL_TABLET | Freq: Every day | ORAL | 0 refills | Status: DC

## 2021-02-09 MED ORDER — VITAMIN D (ERGOCALCIFEROL) 1.25 MG (50000 UNIT) PO CAPS: 50000.0000 [IU] | ORAL_CAPSULE | ORAL | 0 refills | Status: DC

## 2021-02-09 MED FILL — METFORMIN HCL 500 MG TABS: 500 | 30 days supply | Qty: 30 | Fill #0

## 2021-02-10 ENCOUNTER — Encounter (INDEPENDENT_AMBULATORY_CARE_PROVIDER_SITE_OTHER): Payer: Self-pay | Admitting: Family Medicine

## 2021-02-10 ENCOUNTER — Other Ambulatory Visit (HOSPITAL_COMMUNITY): Payer: Self-pay | Admitting: Optometry

## 2021-02-10 DIAGNOSIS — R7303 Prediabetes: Secondary | ICD-10-CM | POA: Insufficient documentation

## 2021-02-10 DIAGNOSIS — H524 Presbyopia: Secondary | ICD-10-CM | POA: Diagnosis not present

## 2021-02-10 DIAGNOSIS — H5211 Myopia, right eye: Secondary | ICD-10-CM | POA: Diagnosis not present

## 2021-02-10 DIAGNOSIS — H52223 Regular astigmatism, bilateral: Secondary | ICD-10-CM | POA: Diagnosis not present

## 2021-02-10 DIAGNOSIS — H00022 Hordeolum internum right lower eyelid: Secondary | ICD-10-CM | POA: Diagnosis not present

## 2021-02-10 MED FILL — CEPHALEXIN 500 MG CAPSULE: 500 | 10 days supply | Qty: 20 | Fill #0

## 2021-02-10 NOTE — Progress Notes (Signed)
Chief Complaint:   OBESITY Dana Williams is here to discuss her progress with her obesity treatment plan along with follow-up of her obesity related diagnoses. Dana Williams is on the Category 3 Plan and states she is following her eating plan approximately 50% of the time. Dana Williams states she is not exercising regularly.  Today's visit was #: 43 Starting weight: 236 lbs Starting date: 08/10/2018 Today's weight: 236 lbs Today's date: 02/09/2021 Total lbs lost to date: 0 Total lbs lost since last in-office visit: 0  Interim History: Dana Williams returned to our practice after 6 month hiatus 2 weeks ago and was down 2 pounds.  She has maintained her weight today. She has been unable to get started on plan due to recent birthday celebrations.  She will also celebrate more this weekend and have cake.  She is deviating from the plan quite often.  Subjective:   1. Prediabetes Last A1c 5.7.  Notes polyphagia in the evening.   Lab Results  Component Value Date   HGBA1C 5.7 (H) 05/28/2020   Lab Results  Component Value Date   INSULIN 19.4 05/28/2020   INSULIN 14.5 09/25/2019   INSULIN 11.5 12/12/2018   INSULIN 10.9 08/10/2018   2. Vitamin D deficiency Vitamin D low at 41.0.  On prescription vitamin D weekly.  3. Other depression, with emotional eating She is unable to tell if bupropion is helping with appetite or cravings.  Tolerating well.  No insomnia.  Blood pressure within normal limits.  4. At risk for side effect of medication Dana Williams is at risk for side effect of medication due to starting metformin.   Assessment/Plan:   1. Prediabetes Start metformin 500 mg daily with dinner, as per below.  - Start metFORMIN (GLUCOPHAGE) 500 MG tablet; Take 1 tablet (500 mg total) by mouth daily with supper.  Dispense: 30 tablet; Refill: 0  2. Vitamin D deficiency Will refill vitamin D 50,000 IU weekly, as per below.  - Refill Vitamin D, Ergocalciferol, (DRISDOL) 1.25 MG (50000 UNIT) CAPS capsule; Take 1  capsule (50,000 Units total) by mouth every 7 (seven) days.  Dispense: 4 capsule; Refill: 0  3. Other depression, with emotional eating Continue bupropion.  4. At risk for side effect of medication Dana Williams was given approximately 15 minutes of drug side effect counseling today.  We discussed side effect possibility and risk versus benefits. Dana Williams agreed to the medication and will contact this office if these side effects are intolerable.  Repetitive spaced learning was employed today to elicit superior memory formation and behavioral change.  5. Class 2 severe obesity with serious comorbidity and body mass index (BMI) of 39.0 to 39.9 in adult, unspecified obesity type Dana Williams)  Dana Williams is currently in the action stage of change. As such, her goal is to continue with weight loss efforts. She has agreed to the Category 3 Plan.   Gave journaling parameters for each meal so she can choose appropriately if deviating from plan.  Handout provided:  Protein Content of Food.  Exercise goals: No exercise has been prescribed at this time.  Behavioral modification strategies: increasing lean protein intake, decreasing simple carbohydrates and no skipping meals.  Dana Williams has agreed to follow-up with our clinic in 2 weeks with me and in 4 weeks with Dr. [name].  Objective:   Blood pressure 122/81, pulse 86, temperature (!) 97.4 F (36.3 C), height 5\' 5"  (1.651 m), weight 236 lb (107 kg), SpO2 98 %. Body mass index is 39.27 kg/m.  General: Cooperative,  alert, well developed, in no acute distress. HEENT: Conjunctivae and lids unremarkable. Cardiovascular: Regular rhythm.  Lungs: Normal work of breathing. Neurologic: No focal deficits.   Lab Results  Component Value Date   CREATININE 0.69 05/28/2020   BUN 14 05/28/2020   NA 143 05/28/2020   K 4.8 05/28/2020   CL 104 05/28/2020   CO2 24 05/28/2020   Lab Results  Component Value Date   ALT 14 05/28/2020   AST 15 05/28/2020   ALKPHOS 123 (H)  05/28/2020   BILITOT 0.3 05/28/2020   Lab Results  Component Value Date   HGBA1C 5.7 (H) 05/28/2020   HGBA1C 5.5 09/25/2019   HGBA1C 5.4 12/12/2018   HGBA1C 5.4 08/10/2018   Lab Results  Component Value Date   INSULIN 19.4 05/28/2020   INSULIN 14.5 09/25/2019   INSULIN 11.5 12/12/2018   INSULIN 10.9 08/10/2018   Lab Results  Component Value Date   TSH 1.920 05/28/2020   Lab Results  Component Value Date   CHOL 176 05/28/2020   HDL 84 05/28/2020   LDLCALC 75 05/28/2020   TRIG 94 05/28/2020   Lab Results  Component Value Date   WBC 9.3 09/25/2019   HGB 12.5 09/25/2019   HCT 39.9 09/25/2019   MCV 89 09/25/2019   PLT 249 09/25/2019   Attestation Statements:   Reviewed by clinician on day of visit: allergies, medications, problem list, medical history, surgical history, family history, social history, and previous encounter notes.  I, Water quality scientist, CMA, am acting as Location manager for [employer], [place].  I have reviewed the above documentation for accuracy and completeness, and I agree with the above. -  Georgianne Fick, FNP

## 2021-02-11 ENCOUNTER — Ambulatory Visit: Payer: 59 | Admitting: Pulmonary Disease

## 2021-02-11 ENCOUNTER — Encounter: Payer: Self-pay | Admitting: Pulmonary Disease

## 2021-02-11 ENCOUNTER — Other Ambulatory Visit: Payer: Self-pay

## 2021-02-11 VITALS — BP 124/82 | HR 98 | Temp 97.8°F | Ht 64.0 in | Wt 240.0 lb

## 2021-02-11 DIAGNOSIS — R0609 Other forms of dyspnea: Secondary | ICD-10-CM

## 2021-02-11 DIAGNOSIS — Z6841 Body Mass Index (BMI) 40.0 and over, adult: Secondary | ICD-10-CM

## 2021-02-11 DIAGNOSIS — L409 Psoriasis, unspecified: Secondary | ICD-10-CM | POA: Diagnosis not present

## 2021-02-11 DIAGNOSIS — R0602 Shortness of breath: Secondary | ICD-10-CM

## 2021-02-11 DIAGNOSIS — R0600 Dyspnea, unspecified: Secondary | ICD-10-CM

## 2021-02-11 NOTE — Progress Notes (Signed)
Synopsis: Referred in March 2022 for self-referral shortness of breath, PCP: By Aretta Nip, MD  Subjective:   PATIENT ID: Dana Williams GENDER: female DOB: 12/27/1960, MRN: [id number]  Chief Complaint  Patient presents with  . Consult    Shortness of breath with exertion for last 4-6 months    This is a 60 year old female, past medical history of erythema multiforme, gastroesophageal reflux, history of psoriatic arthritis, psoriasis, urticaria, hiatal hernia, hyperlipidemia.  Patient was referred for evaluation of shortness of breath. She initially thought this was associated with weight gain. She has gained 10lbs over the past 9 months. She feels like its mainly related to eating, lots of snacking. Son works in [place] in Engineer, technical sales and Daughter in school. She lives here in town. She feels winded when she is walking. She has steps in her condo and she feels SOB walking up and down steps. Psoraisis at the base of the scalp.  She had significant joint disease from psoriatic arthritis at the age of 19.  She still has been left with ulnar deviation and arthritis of the hands.  She is not on any biologic medications or immune suppressing medications at this time.  After her second pregnancy she stopped having inflammatory arthritis symptoms.  She has been seen by rheumatology in the past.     Past Medical History:  Diagnosis Date  . Arthritis   . Arthritis   . Arthritis of pelvic region   . Depression   . Dry skin   . Erythema multiforme   . Fatigue   . GERD (gastroesophageal reflux disease)   . H/O psoriatic arthritis    DX  age 64---- in remission for 20 years  . History of hiatal hernia   . History of kidney stones   . Hyperlipidemia   . Joint pain   . Kidney stones   . Obesity   . Personal history of colonic polyp - adenoma 01/29/2014  . Plantar fasciitis of left foot   . PONV (postoperative nausea and vomiting)   . Postmenopausal   . Psoriasis   . Renal calculus, left   .  Sigmoid diverticulosis   . Urticaria   . Vitamin D deficiency      Family History  Problem Relation Age of Onset  . Stomach cancer Paternal Grandmother   . Hypertension Mother   . Hyperlipidemia Mother   . Thyroid disease Mother   . Obesity Mother   . Colon cancer Neg Hx   . Esophageal cancer Neg Hx   . Rectal cancer Neg Hx   . Allergic rhinitis Neg Hx   . Asthma Neg Hx   . Eczema Neg Hx   . Urticaria Neg Hx      Past Surgical History:  Procedure Laterality Date  . CLOSED MANIPULATION SHOULDER    . COLONOSCOPY WITH PROPOFOL  01-25-2014  . CYSTO/  BILATERAL RETROGRADE PYELOGRAM/  BILATERAL URETEROSCOPIC LASER LITHOTRIPSY STONE EXTRACTIONS/  BILATERAL STENT PLACEMENT  02-13-2010  . CYSTOSCOPY WITH RETROGRADE PYELOGRAM, URETEROSCOPY AND STENT PLACEMENT Left 04/21/2015   Procedure: CYSTOSCOPY WITH RETROGRADE PYELOGRAM, URETEROSCOPY, STONE EXTRACTION AND STENT PLACEMENT;  Surgeon: Franchot Gallo, MD;  Location: [facility];  Service: Urology;  Laterality: Left;  . CYSTOURETHROSCOPY/  PLACEMENT LYNX SUPRAPUBIC SLING  05-24-2008  . HAND TENDON SURGERY Right 1993  . HOLMIUM LASER APPLICATION Left 03/19/7321   Procedure: HOLMIUM LASER APPLICATION;  Surgeon: Franchot Gallo, MD;  Location: [facility];  Service: Urology;  Laterality:  Left;  . LAPAROSCOPIC CHOLECYSTECTOMY  01-31-2002  . LEFT URETEROSCOPIC STONE EXTRACTION/  STENT PLACEMENT  10-06-2005  . RHINOPLASTY  2007  . RHINOPLASTY    . STRABISMUS SURGERY Left age 66  . TOTAL KNEE ARTHROPLASTY Left 10/27/2016   Procedure: TOTAL KNEE ARTHROPLASTY;  Surgeon: [name], MD;  Location: [facility];  Service: Orthopedics;  Laterality: Left;  . WISDOM TOOTH EXTRACTION  age 45    Social History   Socioeconomic History  . Marital status: Divorced    Spouse name: Not on file  . Number of children: Not on file  . Years of education: Not on file  . Highest education level: Not on file  Occupational  History  . Occupation: Therapist, sports  Tobacco Use  . Smoking status: Never Smoker  . Smokeless tobacco: Never Used  Vaping Use  . Vaping Use: Never used  Substance and Sexual Activity  . Alcohol use: No  . Drug use: No  . Sexual activity: Not on file  Other Topics Concern  . Not on file  Social History Narrative  . Not on file   Social Determinants of Health   Financial Resource Strain: Not on file  Food Insecurity: Not on file  Transportation Needs: Not on file  Physical Activity: Not on file  Stress: Not on file  Social Connections: Not on file  Intimate Partner Violence: Not on file     Allergies  Allergen Reactions  . Sulfa Antibiotics Other (See Comments)    Mouth ulcers     Outpatient Medications Prior to Visit  Medication Sig Dispense Refill  . ammonium lactate (LAC-HYDRIN) 12 % lotion Apply 1 application topically daily.     [name] buPROPion (WELLBUTRIN XL) 150 MG 24 hr tablet Take 1 tablet (150 mg total) by mouth in the morning. 30 tablet 0  . calcipotriene-betamethasone (TACLONEX SCALP) external suspension Apply 1 application topically daily as needed.     . cephALEXin (KEFLEX) 500 MG capsule Take 500 mg by mouth 2 (two) times daily.    [name] esomeprazole (NEXIUM) 40 MG capsule Take 40 mg by mouth daily before breakfast.    . Multiple Vitamins-Minerals (MULTIVITAMIN/EXTRA VITAMIN D3) CHEW Chew 2 tablets by mouth daily.    . rosuvastatin (CRESTOR) 10 MG tablet Take 10 mg by mouth daily.    . metFORMIN (GLUCOPHAGE) 500 MG tablet Take 1 tablet (500 mg total) by mouth daily with supper. (Patient not taking: Reported on 02/11/2021) 30 tablet 0  . Vitamin D, Ergocalciferol, (DRISDOL) 1.25 MG (50000 UNIT) CAPS capsule Take 1 capsule (50,000 Units total) by mouth every 7 (seven) days. (Patient not taking: Reported on 02/11/2021) 4 capsule 0  . valACYclovir (VALTREX) 500 MG tablet Take 500 mg by mouth daily.     No facility-administered medications prior to visit.    ROS   Objective:   Physical Exam   Vitals:   02/11/21 1545  BP: 124/82  Pulse: 98  Temp: 97.8 F (36.6 C)  TempSrc: Temporal  SpO2: 100%  Weight: 240 lb (108.9 kg)  Height: 5\' 4"  (1.626 m)   100% on RA BMI Readings from Last 3 Encounters:  02/11/21 41.20 kg/m  02/09/21 39.27 kg/m  01/27/21 39.27 kg/m   Wt Readings from Last 3 Encounters:  02/11/21 240 lb (108.9 kg)  02/09/21 236 lb (107 kg)  01/27/21 236 lb (107 kg)     CBC    Component Value Date/Time   WBC 9.3 09/25/2019 0935   WBC 7.2 10/28/2016 0530  RBC 4.49 09/25/2019 0935   RBC 3.86 (L) 10/28/2016 0530   HGB 12.5 09/25/2019 0935   HCT 39.9 09/25/2019 0935   PLT 249 09/25/2019 0935   MCV 89 09/25/2019 0935   MCH 27.8 09/25/2019 0935   MCH 29.5 10/28/2016 0530   MCHC 31.3 (L) 09/25/2019 0935   MCHC 32.3 10/28/2016 0530   RDW 13.2 09/25/2019 0935   LYMPHSABS 1.3 09/25/2019 0935   EOSABS 0.1 09/25/2019 0935   BASOSABS 0.1 09/25/2019 0935      Chest Imaging: Chest x-ray February 2017 No significant parenchymal changes noted The patient's images have been independently reviewed by me.    Pulmonary Functions Testing Results: No flowsheet data found.  FeNO:   Pathology:   Echocardiogram:   Heart Catheterization:     Assessment & Plan:     ICD-10-CM   1. DOE (dyspnea on exertion)  R06.00 Pulmonary Function Test  2. SOB (shortness of breath)  R06.02   3. BMI 40.0-44.9, adult ([place])  Z68.41   4. Psoriasis  L40.9     Discussion:  This is a 60 year old female, history of psoriatic arthritis at an early age.  At age 65 developed significant joint disease including knee, spine and joints of the hands.  She is left with some ulnar deviation and swan-neck deformities.  Has been checked many times in the past for rheumatoid.  She is not on any immune suppressing medications at this time.  She does have dyspnea on exertion and shortness of breath.  Patient predominately attributes this to weight gain.  Plan: I  think she we should start with conservative evaluation to include full pulmonary function test. Patients with autoimmune disease are at risk for the development of conditions within the lung and interstitial changes which may attribute to shortness of breath. If she has abnormal pulmonary function test I think next best step would be HRCT imaging. Patient should continue her follow-up with the obesity and weight loss clinic. Patient was counseled on daily exercise and diet modifications. Patient is agreeable to this plan.  We will have her follow-up after her pulmonary function test to discuss next steps.   Current Outpatient Medications:  .  ammonium lactate (LAC-HYDRIN) 12 % lotion, Apply 1 application topically daily. , Disp: , Rfl:  .  buPROPion (WELLBUTRIN XL) 150 MG 24 hr tablet, Take 1 tablet (150 mg total) by mouth in the morning., Disp: 30 tablet, Rfl: 0 .  calcipotriene-betamethasone (TACLONEX SCALP) external suspension, Apply 1 application topically daily as needed. , Disp: , Rfl:  .  cephALEXin (KEFLEX) 500 MG capsule, Take 500 mg by mouth 2 (two) times daily., Disp: , Rfl:  .  esomeprazole (NEXIUM) 40 MG capsule, Take 40 mg by mouth daily before breakfast., Disp: , Rfl:  .  Multiple Vitamins-Minerals (MULTIVITAMIN/EXTRA VITAMIN D3) CHEW, Chew 2 tablets by mouth daily., Disp: , Rfl:  .  rosuvastatin (CRESTOR) 10 MG tablet, Take 10 mg by mouth daily., Disp: , Rfl:  .  metFORMIN (GLUCOPHAGE) 500 MG tablet, Take 1 tablet (500 mg total) by mouth daily with supper. (Patient not taking: Reported on 02/11/2021), Disp: 30 tablet, Rfl: 0 .  Vitamin D, Ergocalciferol, (DRISDOL) 1.25 MG (50000 UNIT) CAPS capsule, Take 1 capsule (50,000 Units total) by mouth every 7 (seven) days. (Patient not taking: Reported on 02/11/2021), Disp: 4 capsule, Rfl: 0  I spent 45 minutes dedicated to the care of this patient on the date of this encounter to include pre-visit review of records, face-to-face  time with  the patient discussing conditions above, post visit ordering of testing, clinical documentation with the electronic health record, making appropriate referrals as documented, and communicating necessary findings to members of the patients care team.   Garner Nash, DO [facility] 02/11/2021 3:53 PM

## 2021-02-11 NOTE — Patient Instructions (Signed)
Thank you for visiting Dr. Valeta Harms at [facility]. Today we recommend the following:  Orders Placed This Encounter  Procedures  . Pulmonary Function Test   Return in about 4 weeks (around 03/11/2021) for Televisit .    Please do your part to reduce the spread of COVID-19.

## 2021-02-18 ENCOUNTER — Other Ambulatory Visit (HOSPITAL_COMMUNITY): Payer: Self-pay | Admitting: Surgery

## 2021-02-18 ENCOUNTER — Other Ambulatory Visit (HOSPITAL_COMMUNITY)
Admission: RE | Admit: 2021-02-18 | Discharge: 2021-02-18 | Disposition: A | Discharge status: Home / Self Care | Payer: 59 | Source: Ambulatory Visit | Attending: Pulmonary Disease | Admitting: Pulmonary Disease

## 2021-02-18 DIAGNOSIS — Z20822 Contact with and (suspected) exposure to covid-19: Secondary | ICD-10-CM | POA: Diagnosis not present

## 2021-02-18 DIAGNOSIS — Z01812 Encounter for preprocedural laboratory examination: Secondary | ICD-10-CM | POA: Insufficient documentation

## 2021-02-18 LAB — SARS CORONAVIRUS 2 (TAT 6-24 HRS): SARS Coronavirus 2: NEGATIVE

## 2021-02-20 ENCOUNTER — Other Ambulatory Visit: Payer: Self-pay

## 2021-02-20 ENCOUNTER — Ambulatory Visit (INDEPENDENT_AMBULATORY_CARE_PROVIDER_SITE_OTHER): Payer: 59 | Admitting: Pulmonary Disease

## 2021-02-20 DIAGNOSIS — R0600 Dyspnea, unspecified: Secondary | ICD-10-CM | POA: Diagnosis not present

## 2021-02-20 DIAGNOSIS — R0609 Other forms of dyspnea: Secondary | ICD-10-CM

## 2021-02-20 LAB — PULMONARY FUNCTION TEST
DL/VA % pred: 109 %
DL/VA: 4.56 ml/min/mmHg/L
DLCO cor % pred: 85 %
DLCO cor: 17.78 ml/min/mmHg
DLCO unc % pred: 85 %
DLCO unc: 17.78 ml/min/mmHg
FEF 25-75 Post: 2.41 L/sec
FEF 25-75 Pre: 2.04 L/sec
FEF2575-%Change-Post: 17 %
FEF2575-%Pred-Post: 101 %
FEF2575-%Pred-Pre: 86 %
FEV1-%Change-Post: 3 %
FEV1-%Pred-Post: 87 %
FEV1-%Pred-Pre: 83 %
FEV1-Post: 2.29 L
FEV1-Pre: 2.2 L
FEV1FVC-%Change-Post: 6 %
FEV1FVC-%Pred-Pre: 102 %
FEV6-%Change-Post: -2 %
FEV6-%Pred-Post: 81 %
FEV6-%Pred-Pre: 83 %
FEV6-Post: 2.68 L
FEV6-Pre: 2.75 L
FEV6FVC-%Pred-Post: 103 %
FEV6FVC-%Pred-Pre: 103 %
FVC-%Change-Post: -2 %
FVC-%Pred-Post: 78 %
FVC-%Pred-Pre: 80 %
FVC-Post: 2.68 L
FVC-Pre: 2.75 L
Post FEV1/FVC ratio: 85 %
Post FEV6/FVC ratio: 100 %
Pre FEV1/FVC ratio: 80 %
Pre FEV6/FVC Ratio: 100 %
RV % pred: 98 %
RV: 2.03 L
TLC % pred: 91 %
TLC: 4.74 L

## 2021-02-20 NOTE — Progress Notes (Signed)
PFT done today. 

## 2021-02-23 ENCOUNTER — Other Ambulatory Visit (HOSPITAL_COMMUNITY): Payer: Self-pay

## 2021-02-23 ENCOUNTER — Encounter (INDEPENDENT_AMBULATORY_CARE_PROVIDER_SITE_OTHER): Payer: Self-pay | Admitting: Family Medicine

## 2021-02-23 ENCOUNTER — Ambulatory Visit (INDEPENDENT_AMBULATORY_CARE_PROVIDER_SITE_OTHER): Payer: 59 | Admitting: Family Medicine

## 2021-02-23 ENCOUNTER — Other Ambulatory Visit: Payer: Self-pay

## 2021-02-23 VITALS — BP 121/76 | HR 93 | Temp 97.9°F | Ht 65.0 in | Wt 236.0 lb

## 2021-02-23 DIAGNOSIS — F3289 Other specified depressive episodes: Secondary | ICD-10-CM | POA: Diagnosis not present

## 2021-02-23 DIAGNOSIS — E559 Vitamin D deficiency, unspecified: Secondary | ICD-10-CM

## 2021-02-23 DIAGNOSIS — R7303 Prediabetes: Secondary | ICD-10-CM | POA: Diagnosis not present

## 2021-02-23 DIAGNOSIS — Z9189 Other specified personal risk factors, not elsewhere classified: Secondary | ICD-10-CM

## 2021-02-23 DIAGNOSIS — Z6839 Body mass index (BMI) 39.0-39.9, adult: Secondary | ICD-10-CM

## 2021-02-23 MED ORDER — VITAMIN D (ERGOCALCIFEROL) 1.25 MG (50000 UNIT) PO CAPS
50000.0000 [IU] | ORAL_CAPSULE | ORAL | 0 refills | Status: DC
  Filled 2021-02-23: qty 4, 28d supply, fill #0

## 2021-02-23 MED ORDER — BD PEN NEEDLE NANO 2ND GEN 32G X 4 MM MISC
0 refills | Status: DC
  Filled 2021-02-23: qty 100, 90d supply, fill #0

## 2021-02-23 MED ORDER — SAXENDA 18 MG/3ML ~~LOC~~ SOPN
3.0000 mg | PEN_INJECTOR | Freq: Every day | SUBCUTANEOUS | 0 refills | Status: DC
  Filled 2021-02-23 – 2021-03-05 (×2): qty 15, 30d supply, fill #0

## 2021-02-23 MED ORDER — BUPROPION HCL ER (XL) 300 MG PO TB24
300.0000 mg | ORAL_TABLET | Freq: Every day | ORAL | 0 refills | Status: DC
  Filled 2021-02-23: qty 30, 30d supply, fill #0

## 2021-02-24 ENCOUNTER — Telehealth (INDEPENDENT_AMBULATORY_CARE_PROVIDER_SITE_OTHER): Payer: Self-pay

## 2021-02-24 ENCOUNTER — Other Ambulatory Visit (HOSPITAL_COMMUNITY): Payer: Self-pay

## 2021-02-24 MED ORDER — INSULIN PEN NEEDLE 32G X 4 MM MISC
0 refills | Status: DC
  Filled 2021-02-24: qty 100, 90d supply, fill #0

## 2021-02-24 NOTE — Progress Notes (Signed)
Chief Complaint:   OBESITY Dana Williams is here to discuss her progress with her obesity treatment plan along with follow-up of her obesity related diagnoses. [name] is on the Category 3 Plan and states she is following her eating plan approximately 40% of the time. [name] states she is not exercising regularly at this time.  Today's visit was #: 27 Starting weight: 236 lbs Starting date: 08/10/2018 Today's weight: 236 lbs Today's date: 02/23/2021 Total lbs lost to date: 0 Total lbs lost since last in-office visit: 0  Interim History: Dana Williams recently celebrated her birthday and is happy with weight maintenance today.  She struggles with meal prep in general.  She would like to start walking or riding her bike for exercise.   Subjective:   1. Prediabetes Last A1c elevated at 5.7.  On metformin.  Endorses polyphagia and cravings.  Lab Results  Component Value Date   HGBA1C 5.7 (H) 05/28/2020   Lab Results  Component Value Date   INSULIN 19.4 05/28/2020   INSULIN 14.5 09/25/2019   INSULIN 11.5 12/12/2018   INSULIN 10.9 08/10/2018   2. Vitamin D deficiency Vitamin D low at 41.0.  She is on weekly prescription vitamin D.  3. Other depression, with emotional eating She notes hunger and cravings at night.  4. At risk for side effect of medication [name] is at risk for side effect of medication due to starting Saxenda.  Assessment/Plan:   1. Prediabetes Will discontinue metformin and start Saxenda.  2. Vitamin D deficiency Refill vitamin D 50,000 IU weekly today, as per below.  - Refill Vitamin D, Ergocalciferol, (DRISDOL) 1.25 MG (50000 UNIT) CAPS capsule; Take 1 capsule (50,000 Units total) by mouth every 7 (seven) days.  Dispense: 4 capsule; Refill: 0  3. Other depression, with emotional eating Increase Wellbutrin XL to 300 mg daily, as per below.  - Increase buPROPion (WELLBUTRIN XL) 300 MG 24 hr tablet; Take 1 tablet (300 mg total) by mouth daily.  Dispense: 30 tablet;  Refill: 0  4. At risk for side effect of medication [name] was given approximately 15 minutes of drug side effect counseling today.  We discussed side effect possibility and risk versus benefits. [name] agreed to the medication and will contact this office if these side effects are intolerable.  Repetitive spaced learning was employed today to elicit superior memory formation and behavioral change.  5. Class 2 severe obesity with serious comorbidity and body mass index (BMI) of 39.0 to 39.9 in adult, unspecified obesity type [facility])  She will start Saxenda 3.0 mg (she will take 0.6 mg for the first week and then increase to 1.2 mg if having no side effects).  Needles will be sent in as well. Anala denies personal or family history of thyroid cancer, history of pancreatitis, or current cholelithiasis. She was informed of side effects.   - Start Liraglutide -Weight Management (SAXENDA) 18 MG/3ML SOPN; Inject 3 mg into the skin daily.  Dispense: 15 mL; Refill: 0 - Insulin Pen Needle (BD PEN NEEDLE NANO 2ND GEN) 32G X 4 MM MISC; Use 1 needle daily to inject Saxenda.  Dispense: 100 each; Refill: 0  Keiosha is currently in the action stage of change. As such, her goal is to continue with weight loss efforts. She has agreed to the Category 3 Plan.   Exercise goals: May walk or ride bike for exercise.  Behavioral modification strategies: increasing lean protein intake, decreasing simple carbohydrates and meal planning and cooking strategies.  [name] has  agreed to follow-up with our clinic in 3 weeks.   Objective:   Blood pressure 121/76, pulse 93, temperature 97.9 F (36.6 C), height 5\' 5"  (1.651 m), weight 236 lb (107 kg), SpO2 97 %. Body mass index is 39.27 kg/m.  General: Cooperative, alert, well developed, in no acute distress. HEENT: Conjunctivae and lids unremarkable. Cardiovascular: Regular rhythm.  Lungs: Normal work of breathing. Neurologic: No focal deficits.   Lab Results  Component  Value Date   CREATININE 0.69 05/28/2020   BUN 14 05/28/2020   NA 143 05/28/2020   K 4.8 05/28/2020   CL 104 05/28/2020   CO2 24 05/28/2020   Lab Results  Component Value Date   ALT 14 05/28/2020   AST 15 05/28/2020   ALKPHOS 123 (H) 05/28/2020   BILITOT 0.3 05/28/2020   Lab Results  Component Value Date   HGBA1C 5.7 (H) 05/28/2020   HGBA1C 5.5 09/25/2019   HGBA1C 5.4 12/12/2018   HGBA1C 5.4 08/10/2018   Lab Results  Component Value Date   INSULIN 19.4 05/28/2020   INSULIN 14.5 09/25/2019   INSULIN 11.5 12/12/2018   INSULIN 10.9 08/10/2018   Lab Results  Component Value Date   TSH 1.920 05/28/2020   Lab Results  Component Value Date   CHOL 176 05/28/2020   HDL 84 05/28/2020   LDLCALC 75 05/28/2020   TRIG 94 05/28/2020   Lab Results  Component Value Date   WBC 9.3 09/25/2019   HGB 12.5 09/25/2019   HCT 39.9 09/25/2019   MCV 89 09/25/2019   PLT 249 09/25/2019   Attestation Statements:   Reviewed by clinician on day of visit: allergies, medications, problem list, medical history, surgical history, family history, social history, and previous encounter notes.  I, Water quality scientist, CMA, am acting as Location manager for [employer], [place].  I have reviewed the above documentation for accuracy and completeness, and I agree with the above. -  Georgianne Fick, FNP

## 2021-02-24 NOTE — Telephone Encounter (Signed)
PA has been submitted through cover my meds for Saxenda  [employer] is reviewing your PA request. You may close this dialog, return to your dashboard, and perform other tasks.  To check for an update later, open this request again from your dashboard. If [employer] has not replied within 24 hours for urgent requests or within 48 hours for standard requests, please contact [employer] at [phone].

## 2021-02-25 ENCOUNTER — Other Ambulatory Visit (HOSPITAL_COMMUNITY): Payer: Self-pay

## 2021-02-25 ENCOUNTER — Encounter (INDEPENDENT_AMBULATORY_CARE_PROVIDER_SITE_OTHER): Payer: Self-pay | Admitting: Family Medicine

## 2021-02-26 NOTE — Telephone Encounter (Signed)
Message from plan: The request has been approved. The authorization is effective for a maximum of 4 fills from 02/26/2021 to 06/27/2021, as long as the member is enrolled in their current health plan. The request was approved as submitted. This request has been approved for 80mL (5 pens) per 30 days. A written notification letter will follow with additional details.

## 2021-03-05 ENCOUNTER — Other Ambulatory Visit (HOSPITAL_COMMUNITY): Payer: Self-pay

## 2021-03-05 ENCOUNTER — Ambulatory Visit (INDEPENDENT_AMBULATORY_CARE_PROVIDER_SITE_OTHER): Payer: 59 | Admitting: Family Medicine

## 2021-03-09 ENCOUNTER — Ambulatory Visit (INDEPENDENT_AMBULATORY_CARE_PROVIDER_SITE_OTHER): Payer: 59 | Admitting: Family Medicine

## 2021-03-09 ENCOUNTER — Encounter (INDEPENDENT_AMBULATORY_CARE_PROVIDER_SITE_OTHER): Payer: Self-pay | Admitting: Family Medicine

## 2021-03-09 ENCOUNTER — Other Ambulatory Visit (HOSPITAL_COMMUNITY): Payer: Self-pay

## 2021-03-09 ENCOUNTER — Other Ambulatory Visit: Payer: Self-pay

## 2021-03-09 VITALS — BP 131/78 | HR 102 | Temp 98.4°F | Ht 65.0 in | Wt 234.0 lb

## 2021-03-09 DIAGNOSIS — Z6839 Body mass index (BMI) 39.0-39.9, adult: Secondary | ICD-10-CM | POA: Diagnosis not present

## 2021-03-09 DIAGNOSIS — R7303 Prediabetes: Secondary | ICD-10-CM | POA: Diagnosis not present

## 2021-03-09 DIAGNOSIS — Z9189 Other specified personal risk factors, not elsewhere classified: Secondary | ICD-10-CM | POA: Diagnosis not present

## 2021-03-10 ENCOUNTER — Other Ambulatory Visit (HOSPITAL_COMMUNITY): Payer: Self-pay

## 2021-03-10 NOTE — Progress Notes (Signed)
Chief Complaint:   OBESITY Dana Williams is here to discuss her progress with her obesity treatment plan along with follow-up of her obesity related diagnoses. Dana Williams is on the Category 3 Plan and states she is following her eating plan approximately 60% of the time. Dana Williams states she is doing 0 minutes 0 times per week.  Today's visit was #: 28 Starting weight: 236 lbs Starting date: 08/10/2018 Today's weight: 234 lbs Today's date: 03/09/2021 Total lbs lost to date: 2 Total lbs lost since last in-office visit: 2  Interim History: Dana Williams continues to work on weight loss. She had multiple celebrations, but she was still able to lose weight. She was prescribed Saxenda, but she hasn't started it yet due to a pharmacy error.  Subjective:   1. Pre-diabetes Dana Williams is stable on metformin, and she will be starting [name] soon. She is working on diet and exercise.  2. At risk for nausea Dana Williams is at risk for nausea due to [name].  Assessment/Plan:   1. Pre-diabetes Dana Williams will continue to work on weight loss, diet, exercise, and decreasing simple carbohydrates to help decrease the risk of diabetes. She is to hold metformin after starting Saxenda.  2. At risk for nausea Dana Williams was given approximately 15 minutes of nausea prevention counseling today. Dana Williams is at risk for nausea due to her new or current medication. She was encouraged to titrate her medication slowly, make sure to stay hydrated, eat smaller portions throughout the day, and avoid high fat meals.   3. Class 2 severe obesity with serious comorbidity and body mass index (BMI) of 39.0 to 39.9 in adult, unspecified obesity type Dana Williams) Dana Williams is currently in the action stage of change. As such, her goal is to continue with weight loss efforts. She has agreed to the Category 3 Plan and keeping a food journal and adhering to recommended goals of 300-400 calories and 25+ grams of protein at breakfast daily.   Behavioral modification  strategies: increasing lean protein intake.  Dana Williams has agreed to follow-up with our clinic in 3 weeks. She was informed of the importance of frequent follow-up visits to maximize her success with intensive lifestyle modifications for her multiple health conditions.   Objective:   Blood pressure 131/78, pulse (!) 102, temperature 98.4 F (36.9 C), height 5\' 5"  (1.651 m), weight 234 lb (106.1 kg), SpO2 95 %. Body mass index is 38.94 kg/m.  General: Cooperative, alert, well developed, in no acute distress. HEENT: Conjunctivae and lids unremarkable. Cardiovascular: Regular rhythm.  Lungs: Normal work of breathing. Neurologic: No focal deficits.   Lab Results  Component Value Date   CREATININE 0.69 05/28/2020   BUN 14 05/28/2020   NA 143 05/28/2020   K 4.8 05/28/2020   CL 104 05/28/2020   CO2 24 05/28/2020   Lab Results  Component Value Date   ALT 14 05/28/2020   AST 15 05/28/2020   ALKPHOS 123 (H) 05/28/2020   BILITOT 0.3 05/28/2020   Lab Results  Component Value Date   HGBA1C 5.7 (H) 05/28/2020   HGBA1C 5.5 09/25/2019   HGBA1C 5.4 12/12/2018   HGBA1C 5.4 08/10/2018   Lab Results  Component Value Date   INSULIN 19.4 05/28/2020   INSULIN 14.5 09/25/2019   INSULIN 11.5 12/12/2018   INSULIN 10.9 08/10/2018   Lab Results  Component Value Date   TSH 1.920 05/28/2020   Lab Results  Component Value Date   CHOL 176 05/28/2020   HDL 84 05/28/2020  LDLCALC 75 05/28/2020   TRIG 94 05/28/2020   Lab Results  Component Value Date   WBC 9.3 09/25/2019   HGB 12.5 09/25/2019   HCT 39.9 09/25/2019   MCV 89 09/25/2019   PLT 249 09/25/2019   No results found for: IRON, TIBC, FERRITIN  Attestation Statements:   Reviewed by clinician on day of visit: allergies, medications, problem list, medical history, surgical history, family history, social history, and previous encounter notes.   I, Trixie Dredge, am acting as transcriptionist for Dennard Nip, MD.  I have  reviewed the above documentation for accuracy and completeness, and I agree with the above. -  Dennard Nip, MD

## 2021-03-12 ENCOUNTER — Other Ambulatory Visit (HOSPITAL_COMMUNITY): Payer: Self-pay

## 2021-03-14 ENCOUNTER — Other Ambulatory Visit (HOSPITAL_COMMUNITY): Payer: Self-pay

## 2021-03-19 ENCOUNTER — Ambulatory Visit: Payer: 59 | Admitting: Pulmonary Disease

## 2021-03-23 ENCOUNTER — Ambulatory Visit (INDEPENDENT_AMBULATORY_CARE_PROVIDER_SITE_OTHER): Payer: 59 | Admitting: Family Medicine

## 2021-03-24 ENCOUNTER — Encounter (INDEPENDENT_AMBULATORY_CARE_PROVIDER_SITE_OTHER): Payer: Self-pay

## 2021-04-11 ENCOUNTER — Encounter (INDEPENDENT_AMBULATORY_CARE_PROVIDER_SITE_OTHER): Payer: Self-pay | Admitting: Family Medicine

## 2021-04-14 ENCOUNTER — Ambulatory Visit (INDEPENDENT_AMBULATORY_CARE_PROVIDER_SITE_OTHER): Payer: 59 | Admitting: Family Medicine

## 2021-04-16 ENCOUNTER — Other Ambulatory Visit (HOSPITAL_COMMUNITY): Payer: Self-pay

## 2021-04-16 MED FILL — Lactic Acid (Ammonium Lactate) Lotion 12%: CUTANEOUS | Qty: 400 | Fill #0 | Status: CN

## 2021-04-20 ENCOUNTER — Encounter (INDEPENDENT_AMBULATORY_CARE_PROVIDER_SITE_OTHER): Payer: Self-pay | Admitting: Physician Assistant

## 2021-04-20 ENCOUNTER — Other Ambulatory Visit: Payer: Self-pay

## 2021-04-20 ENCOUNTER — Ambulatory Visit (INDEPENDENT_AMBULATORY_CARE_PROVIDER_SITE_OTHER): Payer: 59 | Admitting: Physician Assistant

## 2021-04-20 ENCOUNTER — Other Ambulatory Visit (HOSPITAL_COMMUNITY): Payer: Self-pay

## 2021-04-20 VITALS — BP 128/80 | HR 104 | Temp 98.1°F | Ht 65.0 in | Wt 236.0 lb

## 2021-04-20 DIAGNOSIS — F3289 Other specified depressive episodes: Secondary | ICD-10-CM | POA: Diagnosis not present

## 2021-04-20 DIAGNOSIS — E559 Vitamin D deficiency, unspecified: Secondary | ICD-10-CM

## 2021-04-20 DIAGNOSIS — Z9189 Other specified personal risk factors, not elsewhere classified: Secondary | ICD-10-CM

## 2021-04-20 DIAGNOSIS — Z6839 Body mass index (BMI) 39.0-39.9, adult: Secondary | ICD-10-CM

## 2021-04-20 MED ORDER — BUPROPION HCL ER (XL) 300 MG PO TB24
300.0000 mg | ORAL_TABLET | Freq: Every day | ORAL | 0 refills | Status: DC
  Filled 2021-04-20: qty 30, 30d supply, fill #0

## 2021-04-20 MED ORDER — VITAMIN D (ERGOCALCIFEROL) 1.25 MG (50000 UNIT) PO CAPS
ORAL_CAPSULE | ORAL | 0 refills | Status: DC
  Filled 2021-04-20: qty 4, 28d supply, fill #0

## 2021-04-22 ENCOUNTER — Other Ambulatory Visit (HOSPITAL_COMMUNITY): Payer: Self-pay

## 2021-04-22 NOTE — Progress Notes (Signed)
Chief Complaint:   OBESITY Dana Williams is here to discuss her progress with her obesity treatment plan along with follow-up of her obesity related diagnoses. Dana Williams is on the Category 3 Plan and states she is following her eating plan approximately 40% of the time. Dana Williams states she is doing 0 minutes 0 times per week.  Today's visit was #: 100 Starting weight: 236 lbs Starting date: 08/10/2018 Today's weight: 236 lbs Today's date: 04/20/2021 Total lbs lost to date: 0 Total lbs lost since last in-office visit: 0  Interim History: Thania was on vacation last week, and she reports that she drank a lot. She is working three 12 hour shifts. She has not been meal planning well and she wants to make that a goal.  Subjective:   1. Vitamin D deficiency Dana Williams is on Vit D, and she is tolerating it well.  2. Other depression, with emotional eating Dana Williams notes decrease in cravings on Wellbutrin.  3. At risk for osteoporosis Dana Williams is at higher risk of osteopenia and osteoporosis due to Vitamin D deficiency.   Assessment/Plan:   1. Vitamin D deficiency Low Vitamin D level contributes to fatigue and are associated with obesity, breast, and colon cancer. We will refill prescription Vitamin D for 1 month. Dana Williams will follow-up for routine testing of Vitamin D, at least 2-3 times per year to avoid over-replacement.  - Vitamin D, Ergocalciferol, (DRISDOL) 1.25 MG (50000 UNIT) CAPS capsule; TAKE 1 CAPSULE (50,000 UNITS TOTAL) BY MOUTH EVERY 7 (SEVEN) DAYS.  Dispense: 4 capsule; Refill: 0  2. Other depression, with emotional eating Behavior modification techniques were discussed today to help Dana Williams deal with her emotional/non-hunger eating behaviors. We will refill Wellbutrin XL for 1 month. Orders and follow up as documented in patient record.   - buPROPion (WELLBUTRIN XL) 300 MG 24 hr tablet; Take 1 tablet (300 mg total) by mouth daily.  Dispense: 30 tablet; Refill: 0  3. At risk for osteoporosis Dana Williams  was given approximately 15 minutes of osteoporosis prevention counseling today. Dana Williams is at risk for osteopenia and osteoporosis due to her Vitamin D deficiency. She was encouraged to take her Vitamin D and follow her higher calcium diet and increase strengthening exercise to help strengthen her bones and decrease her risk of osteopenia and osteoporosis.  Repetitive spaced learning was employed today to elicit superior memory formation and behavioral change.  4. Class 2 severe obesity with serious comorbidity and body mass index (BMI) of 39.0 to 39.9 in adult, unspecified obesity type [facility]) Dana Williams is currently in the action stage of change. As such, her goal is to continue with weight loss efforts. She has agreed to the Category 3 Plan and keeping a food journal and adhering to recommended goals of 300-400 calories and 25 grams of protein at breakfast daily.   We will recheck labs and IC at her next visit.  Exercise goals: No exercise has been prescribed at this time.  Behavioral modification strategies: meal planning and cooking strategies and avoiding temptations.  Dana Williams has agreed to follow-up with our clinic in 3 weeks. She was informed of the importance of frequent follow-up visits to maximize her success with intensive lifestyle modifications for her multiple health conditions.   Objective:   Blood pressure 128/80, pulse (!) 104, temperature 98.1 F (36.7 C), height 5\' 5"  (1.651 m), weight 236 lb (107 kg), SpO2 96 %. Body mass index is 39.27 kg/m.  General: Cooperative, alert, well developed, in no acute distress. HEENT: Conjunctivae  and lids unremarkable. Cardiovascular: Regular rhythm.  Lungs: Normal work of breathing. Neurologic: No focal deficits.   Lab Results  Component Value Date   CREATININE 0.69 05/28/2020   BUN 14 05/28/2020   NA 143 05/28/2020   K 4.8 05/28/2020   CL 104 05/28/2020   CO2 24 05/28/2020   Lab Results  Component Value Date   ALT 14 05/28/2020    AST 15 05/28/2020   ALKPHOS 123 (H) 05/28/2020   BILITOT 0.3 05/28/2020   Lab Results  Component Value Date   HGBA1C 5.7 (H) 05/28/2020   HGBA1C 5.5 09/25/2019   HGBA1C 5.4 12/12/2018   HGBA1C 5.4 08/10/2018   Lab Results  Component Value Date   INSULIN 19.4 05/28/2020   INSULIN 14.5 09/25/2019   INSULIN 11.5 12/12/2018   INSULIN 10.9 08/10/2018   Lab Results  Component Value Date   TSH 1.920 05/28/2020   Lab Results  Component Value Date   CHOL 176 05/28/2020   HDL 84 05/28/2020   LDLCALC 75 05/28/2020   TRIG 94 05/28/2020   Lab Results  Component Value Date   WBC 9.3 09/25/2019   HGB 12.5 09/25/2019   HCT 39.9 09/25/2019   MCV 89 09/25/2019   PLT 249 09/25/2019   No results found for: IRON, TIBC, FERRITIN  Attestation Statements:   Reviewed by clinician on day of visit: allergies, medications, problem list, medical history, surgical history, family history, social history, and previous encounter notes.   Wilhemena Durie, am acting as transcriptionist for [employer], PA-C.  I have reviewed the above documentation for accuracy and completeness, and I agree with the above. Abby Potash, PA-C

## 2021-04-23 ENCOUNTER — Other Ambulatory Visit (HOSPITAL_COMMUNITY): Payer: Self-pay

## 2021-04-23 MED ORDER — ROSUVASTATIN CALCIUM 10 MG PO TABS
ORAL_TABLET | ORAL | 0 refills | Status: DC
  Filled 2021-04-23: qty 90, 90d supply, fill #0

## 2021-04-23 MED ORDER — ESOMEPRAZOLE MAGNESIUM 40 MG PO CPDR
DELAYED_RELEASE_CAPSULE | ORAL | 0 refills | Status: DC
  Filled 2021-04-23: qty 90, 90d supply, fill #0

## 2021-04-24 ENCOUNTER — Other Ambulatory Visit (HOSPITAL_COMMUNITY): Payer: Self-pay

## 2021-05-14 ENCOUNTER — Ambulatory Visit (INDEPENDENT_AMBULATORY_CARE_PROVIDER_SITE_OTHER): Payer: 59 | Admitting: Physician Assistant

## 2021-05-28 ENCOUNTER — Encounter (INDEPENDENT_AMBULATORY_CARE_PROVIDER_SITE_OTHER): Payer: Self-pay | Admitting: Family Medicine

## 2021-05-28 ENCOUNTER — Other Ambulatory Visit: Payer: Self-pay

## 2021-05-28 ENCOUNTER — Ambulatory Visit (INDEPENDENT_AMBULATORY_CARE_PROVIDER_SITE_OTHER): Payer: 59 | Admitting: Physician Assistant

## 2021-05-28 ENCOUNTER — Other Ambulatory Visit (HOSPITAL_COMMUNITY): Payer: Self-pay

## 2021-05-28 ENCOUNTER — Ambulatory Visit (INDEPENDENT_AMBULATORY_CARE_PROVIDER_SITE_OTHER): Payer: 59 | Admitting: Family Medicine

## 2021-05-28 VITALS — BP 122/77 | HR 95 | Temp 97.8°F | Ht 65.0 in | Wt 236.0 lb

## 2021-05-28 DIAGNOSIS — R0602 Shortness of breath: Secondary | ICD-10-CM

## 2021-05-28 DIAGNOSIS — E7849 Other hyperlipidemia: Secondary | ICD-10-CM

## 2021-05-28 DIAGNOSIS — Z0000 Encounter for general adult medical examination without abnormal findings: Secondary | ICD-10-CM

## 2021-05-28 DIAGNOSIS — F3289 Other specified depressive episodes: Secondary | ICD-10-CM

## 2021-05-28 DIAGNOSIS — E559 Vitamin D deficiency, unspecified: Secondary | ICD-10-CM | POA: Diagnosis not present

## 2021-05-28 DIAGNOSIS — Z6839 Body mass index (BMI) 39.0-39.9, adult: Secondary | ICD-10-CM | POA: Diagnosis not present

## 2021-05-28 DIAGNOSIS — Z9189 Other specified personal risk factors, not elsewhere classified: Secondary | ICD-10-CM | POA: Diagnosis not present

## 2021-05-28 DIAGNOSIS — R7303 Prediabetes: Secondary | ICD-10-CM | POA: Diagnosis not present

## 2021-05-28 DIAGNOSIS — E66812 Obesity, class 2: Secondary | ICD-10-CM

## 2021-05-28 MED ORDER — METFORMIN HCL 500 MG PO TABS
ORAL_TABLET | ORAL | 0 refills | Status: DC
  Filled 2021-05-28: qty 30, 30d supply, fill #0

## 2021-05-28 MED ORDER — VITAMIN D (ERGOCALCIFEROL) 1.25 MG (50000 UNIT) PO CAPS
ORAL_CAPSULE | ORAL | 0 refills | Status: DC
  Filled 2021-05-28: qty 4, 28d supply, fill #0

## 2021-05-28 MED ORDER — SAXENDA 18 MG/3ML ~~LOC~~ SOPN
3.0000 mg | PEN_INJECTOR | Freq: Every day | SUBCUTANEOUS | 0 refills | Status: DC
  Filled 2021-05-28: qty 15, 30d supply, fill #0

## 2021-05-28 MED ORDER — BUPROPION HCL ER (XL) 300 MG PO TB24
300.0000 mg | ORAL_TABLET | Freq: Every day | ORAL | 0 refills | Status: DC
  Filled 2021-05-28: qty 30, 30d supply, fill #0

## 2021-05-29 LAB — COMPREHENSIVE METABOLIC PANEL
ALT: 11 IU/L (ref 0–32)
AST: 11 IU/L (ref 0–40)
Albumin/Globulin Ratio: 2 (ref 1.2–2.2)
Albumin: 4.4 g/dL (ref 3.8–4.8)
Alkaline Phosphatase: 148 IU/L — ABNORMAL HIGH (ref 44–121)
BUN/Creatinine Ratio: 15 (ref 12–28)
BUN: 11 mg/dL (ref 8–27)
Bilirubin Total: 0.3 mg/dL (ref 0.0–1.2)
CO2: 24 mmol/L (ref 20–29)
Calcium: 9.3 mg/dL (ref 8.7–10.3)
Chloride: 104 mmol/L (ref 96–106)
Creatinine, Ser: 0.75 mg/dL (ref 0.57–1.00)
Globulin, Total: 2.2 g/dL (ref 1.5–4.5)
Glucose: 104 mg/dL — ABNORMAL HIGH (ref 65–99)
Potassium: 4.7 mmol/L (ref 3.5–5.2)
Sodium: 143 mmol/L (ref 134–144)
Total Protein: 6.6 g/dL (ref 6.0–8.5)
eGFR: 91 mL/min/{1.73_m2} (ref >59)

## 2021-05-29 LAB — LIPID PANEL WITH LDL/HDL RATIO
Cholesterol, Total: 186 mg/dL (ref 100–199)
HDL: 91 mg/dL (ref >39)
LDL Chol Calc (NIH): 81 mg/dL (ref 0–99)
LDL/HDL Ratio: 0.9 ratio (ref 0.0–3.2)
Triglycerides: 76 mg/dL (ref 0–149)
VLDL Cholesterol Cal: 14 mg/dL (ref 5–40)

## 2021-05-29 LAB — HEMOGLOBIN A1C
Est. average glucose Bld gHb Est-mCnc: 120 mg/dL
Hgb A1c MFr Bld: 5.8 % — ABNORMAL HIGH (ref 4.8–5.6)

## 2021-05-29 LAB — VITAMIN D 25 HYDROXY (VIT D DEFICIENCY, FRACTURES): Vit D, 25-Hydroxy: 45.2 ng/mL (ref 30.0–100.0)

## 2021-05-29 LAB — INSULIN, RANDOM: INSULIN: 19.7 u[IU]/mL (ref 2.6–24.9)

## 2021-06-03 NOTE — Progress Notes (Signed)
Chief Complaint:   OBESITY Dana Williams is here to discuss her progress with her obesity treatment plan along with follow-up of her obesity related diagnoses. Dana Williams is on the Category 3 Plan and keeping a food journal and adhering to recommended goals of 300-400 calories and 25 protein at breakfast daily and states she is following her eating plan approximately 75% of the time. Dana Williams states she is doing 0 0 minutes 0 times per week.  Today's visit was #: 30 Starting weight: 236 lbs Starting date: 08/10/2018 Today's weight: 236 lbs Today's date: 05/28/2021 Total lbs lost to date: 0 Total lbs lost since last in-office visit: 0  Interim History: Dana Williams is struggling with getting breakfast in. She goes to work very early but she also tends to skip when she is at home. She is on 0.6 mg of Saxenda daily. She is tolerating Saxenda well. She tends to snack too much at night.  Subjective:   1. Other depression, with emotional eating Dana Williams's mood is stable but, she does have cravings at night.   2. Vitamin D deficiency Dana Williams is on Rx Vitamin D weekly. Her Vitamin D is slightly low at 41.  Lab Results  Component Value Date   VD25OH 45.2 05/28/2021   VD25OH 41.0 05/28/2020   VD25OH 48.7 09/25/2019    3. Healthcare maintenance Dana Williams's cholesterol has not been checked in over a year.  Lab Results  Component Value Date   ALT 11 05/28/2021   AST 11 05/28/2021   ALKPHOS 148 (H) 05/28/2021   BILITOT 0.3 05/28/2021   Lab Results  Component Value Date   CHOL 186 05/28/2021   HDL 91 05/28/2021   LDLCALC 81 05/28/2021   TRIG 76 05/28/2021     4. Pre-diabetes Dana Williams's last A1C was at 5.7. She is on Saxenda.  Lab Results  Component Value Date   HGBA1C 5.8 (H) 05/28/2021   Lab Results  Component Value Date   INSULIN 19.7 05/28/2021   INSULIN 19.4 05/28/2020   INSULIN 14.5 09/25/2019   INSULIN 11.5 12/12/2018   INSULIN 10.9 08/10/2018    5. SOB (shortness of breath) on  exertion Dana Williams notes increasing shortness of breath with exercising and seems to be worsening over time with weight gain. She notes getting out of breath sooner with activity than she used to. This has not gotten worse recently. Dana Williams denies shortness of breath at rest or orthopnea.   6. Other hyperlipidemia Patient denies myalgias.   Lab Results  Component Value Date   CHOL 186 05/28/2021   HDL 91 05/28/2021   LDLCALC 81 05/28/2021   TRIG 76 05/28/2021   Lab Results  Component Value Date   ALT 11 05/28/2021   AST 11 05/28/2021   ALKPHOS 148 (H) 05/28/2021   BILITOT 0.3 05/28/2021   The 10-year ASCVD risk score Dana Williams DC Jr., et al., 2013) is: 2.3%   Values used to calculate the score:     Age: 60 years     Sex: Female     Is Non-Hispanic African American: No     Diabetic: No     Tobacco smoker: No     Systolic Blood Pressure: 941 mmHg     Is BP treated: No     HDL Cholesterol: 91 mg/dL     Total Cholesterol: 186 mg/dL   7. At risk for deficient intake of food Dana Williams is at elevated risk due to inadequate protein.   Assessment/Plan:   1. Other depression, with  emotional eating Behavior modification techniques were discussed today to help Dana Williams deal with her emotional/non-hunger eating behaviors. We will refill Wellbutrin XL for 1 month with no refills.Orders and follow up as documented in patient record.    - buPROPion (WELLBUTRIN XL) 300 MG 24 hr tablet; Take 1 tablet  by mouth daily.  Dispense: 30 tablet; Refill: 0  2. Vitamin D deficiency Low Vitamin D level contributes to fatigue and are associated with obesity, breast, and colon cancer. We will refill  prescription Vitamin D for 1 month with no refills and she agrees to continue to take prescription Vitamin D 50,000 IU every week and will follow-up for routine testing of Vitamin D, at least 2-3 times per year to avoid over-replacement.  - VITAMIN D 25 Hydroxy (Vit-D Deficiency, Fractures) - Vitamin D, Ergocalciferol,  (DRISDOL) 1.25 MG (50000 UNIT) CAPS capsule; TAKE 1 CAPSULE BY MOUTH EVERY 7  DAYS.  Dispense: 4 capsule; Refill: 0  3. Healthcare maintenance We will check FLP today - Lipid Panel With LDL/HDL Ratio  4. Pre-diabetes Dana Williams will check labs today. Dana Williams will continue Saxenda.  - Comprehensive metabolic panel - Hemoglobin A1c - Insulin, random - metFORMIN (GLUCOPHAGE) 500 MG tablet; TAKE 1 TABLET  BY MOUTH DAILY WITH SUPPER.  Dispense: 30 tablet; Refill: 0  5. SOB (shortness of breath) on exertion Dana Williams does not feel that she gets out of breath more easily that she used to when she exercises. Dana Williams's shortness of breath appears to be obesity related and exercise induced. She has agreed to work on weight loss and gradually increase exercise to treat her exercise induced shortness of breath. Will continue to monitor closely. Dana Williams's IC was done today it showed RMR of 2174, slightly down from initial IC of 2247.   6. At risk for deficient intake of food Dana Williams was given approximately 15 minutes of depression risk counseling today. She has risk factors for depression. We discussed the importance of a healthy work life balance, a healthy relationship with food and a good support system.  Repetitive spaced learning was employed today to elicit superior memory formation and behavioral change.   7.. Obesity: Current BMI 39.27 - Liraglutide -Weight Management (SAXENDA) 18 MG/3ML SOPN; Inject 3 mg into the skin daily.  Dispense: 15 mL; Refill: 0  Dana Williams is currently in the action stage of change. As such, her goal is to continue with weight loss efforts. She has agreed to the Category 3 Plan.   Dana Williams will increase Saxenda to 1.2 mg daily She will have a cup of chocolate milk ([employer]) in am.  Exercise goals: All adults should avoid inactivity. Some physical activity is better than none, and adults who participate in any amount of physical activity gain some health benefits.  Behavioral  modification strategies: increasing lean protein intake, no skipping meals, meal planning and cooking strategies, and better snacking choices.  Dana Williams has agreed to follow-up with our clinic in 2 weeks.  Objective:   Blood pressure 122/77, pulse 95, temperature 97.8 F (36.6 C), height 5\' 5"  (1.651 m), weight 236 lb (107 kg), SpO2 97 %. Body mass index is 39.27 kg/m.  General: Cooperative, alert, well developed, in no acute distress. HEENT: Conjunctivae and lids unremarkable. Cardiovascular: Regular rhythm.  Lungs: Normal work of breathing. Neurologic: No focal deficits.   Lab Results  Component Value Date   CREATININE 0.75 05/28/2021   BUN 11 05/28/2021   NA 143 05/28/2021   K 4.7 05/28/2021   CL 104 05/28/2021  CO2 24 05/28/2021   Lab Results  Component Value Date   ALT 11 05/28/2021   AST 11 05/28/2021   ALKPHOS 148 (H) 05/28/2021   BILITOT 0.3 05/28/2021   Lab Results  Component Value Date   HGBA1C 5.8 (H) 05/28/2021   HGBA1C 5.7 (H) 05/28/2020   HGBA1C 5.5 09/25/2019   HGBA1C 5.4 12/12/2018   HGBA1C 5.4 08/10/2018   Lab Results  Component Value Date   INSULIN 19.7 05/28/2021   INSULIN 19.4 05/28/2020   INSULIN 14.5 09/25/2019   INSULIN 11.5 12/12/2018   INSULIN 10.9 08/10/2018   Lab Results  Component Value Date   TSH 1.920 05/28/2020   Lab Results  Component Value Date   CHOL 186 05/28/2021   HDL 91 05/28/2021   LDLCALC 81 05/28/2021   TRIG 76 05/28/2021   Lab Results  Component Value Date   VD25OH 45.2 05/28/2021   VD25OH 41.0 05/28/2020   VD25OH 48.7 09/25/2019   Lab Results  Component Value Date   WBC 9.3 09/25/2019   HGB 12.5 09/25/2019   HCT 39.9 09/25/2019   MCV 89 09/25/2019   PLT 249 09/25/2019   No results found for: IRON, TIBC, FERRITIN  Attestation Statements:   Reviewed by clinician on day of visit: allergies, medications, problem list, medical history, surgical history, family history, social history, and previous  encounter notes.  I, Lizbeth Bark, RMA, am acting as Location manager for [employer], [place].  I have reviewed the above documentation for accuracy and completeness, and I agree with the above. -  Georgianne Fick, FNP

## 2021-06-11 ENCOUNTER — Encounter (INDEPENDENT_AMBULATORY_CARE_PROVIDER_SITE_OTHER): Payer: Self-pay

## 2021-06-11 ENCOUNTER — Ambulatory Visit (INDEPENDENT_AMBULATORY_CARE_PROVIDER_SITE_OTHER): Payer: 59 | Admitting: Family Medicine

## 2021-06-29 ENCOUNTER — Ambulatory Visit (INDEPENDENT_AMBULATORY_CARE_PROVIDER_SITE_OTHER): Payer: 59 | Admitting: Family Medicine

## 2021-07-07 ENCOUNTER — Encounter: Payer: Self-pay | Admitting: Internal Medicine

## 2021-07-09 ENCOUNTER — Ambulatory Visit (INDEPENDENT_AMBULATORY_CARE_PROVIDER_SITE_OTHER): Payer: 59 | Admitting: Family Medicine

## 2021-07-09 ENCOUNTER — Other Ambulatory Visit (HOSPITAL_COMMUNITY): Payer: Self-pay

## 2021-07-09 ENCOUNTER — Other Ambulatory Visit: Payer: Self-pay

## 2021-07-09 ENCOUNTER — Encounter (INDEPENDENT_AMBULATORY_CARE_PROVIDER_SITE_OTHER): Payer: Self-pay | Admitting: Family Medicine

## 2021-07-09 DIAGNOSIS — E559 Vitamin D deficiency, unspecified: Secondary | ICD-10-CM

## 2021-07-09 DIAGNOSIS — R7303 Prediabetes: Secondary | ICD-10-CM

## 2021-07-09 DIAGNOSIS — Z6839 Body mass index (BMI) 39.0-39.9, adult: Secondary | ICD-10-CM | POA: Diagnosis not present

## 2021-07-09 DIAGNOSIS — F3289 Other specified depressive episodes: Secondary | ICD-10-CM

## 2021-07-09 DIAGNOSIS — Z9189 Other specified personal risk factors, not elsewhere classified: Secondary | ICD-10-CM | POA: Diagnosis not present

## 2021-07-09 MED ORDER — VITAMIN D (ERGOCALCIFEROL) 1.25 MG (50000 UNIT) PO CAPS
ORAL_CAPSULE | ORAL | 0 refills | Status: DC
  Filled 2021-07-09: qty 4, 28d supply, fill #0

## 2021-07-09 MED ORDER — METFORMIN HCL 500 MG PO TABS
ORAL_TABLET | ORAL | 0 refills | Status: DC
  Filled 2021-07-09: qty 30, 30d supply, fill #0

## 2021-07-09 MED ORDER — WEGOVY 1.7 MG/0.75ML ~~LOC~~ SOAJ
1.7000 mg | SUBCUTANEOUS | 0 refills | Status: DC
  Filled 2021-07-09: qty 3, 28d supply, fill #0

## 2021-07-09 MED ORDER — BUPROPION HCL ER (XL) 300 MG PO TB24
300.0000 mg | ORAL_TABLET | Freq: Every day | ORAL | 0 refills | Status: DC
  Filled 2021-07-09: qty 30, 30d supply, fill #0

## 2021-07-09 NOTE — Progress Notes (Signed)
Chief Complaint:   OBESITY Dana Williams is here to discuss her progress with her obesity treatment plan along with follow-up of her obesity related diagnoses. Dylanne is on the Category 3 Plan and states she is following her eating plan approximately 35% of the time. Genecis states she is doing 0 minutes 0 times per week.  Today's visit was #: 13 Starting weight: 236 lbs Starting date: 08/10/2018 Today's weight: 234 lbs Today's date: 07/09/2021 Total lbs lost to date: 2 lbs Total lbs lost since last in-office visit: 2 lbs  Interim History: Dana Williams does not feel she does well on plan on work days. She is not planning meals well and would like help with this.  Subjective:   1. Other depression, with emotional eating Dana Williams's mood and cravings are well controlled. She is on bupropion 300 mg XL everyday.  2. Pre-diabetes Dana Williams's last A1C was 5.8. She is on Saxenda and Metformin.  Lab Results  Component Value Date   HGBA1C 5.8 (H) 05/28/2021   Lab Results  Component Value Date   INSULIN 19.7 05/28/2021   INSULIN 19.4 05/28/2020   INSULIN 14.5 09/25/2019   INSULIN 11.5 12/12/2018   INSULIN 10.9 08/10/2018    3. Vitamin D deficiency Dana Williams's Vitamin D is slightly low at 45.2. She is on weekly prescription Vitamin D.  Lab Results  Component Value Date   VD25OH 45.2 05/28/2021   VD25OH 41.0 05/28/2020   VD25OH 48.7 09/25/2019    4. At risk for side effect of medication Dana Williams is at risk for side effect of medication due to switch to [facility] from Saxenda.  Assessment/Plan:   1. Other depression, with emotional eating We will refill bupropion 300 mg XL daily. Behavior modification techniques were discussed today to help Dana Williams deal with her emotional/non-hunger eating behaviors.     - buPROPion (WELLBUTRIN XL) 300 MG 24 hr tablet; Take 1 tablet  by mouth daily.  Dispense: 30 tablet; Refill: 0  2. Pre-diabetes Dana Williams agrees to start Wegovy 1.7 mg weekly. She will discontinue Saxenda  before starting Wegovy. We will refill Metformin 500 daily at supper.   - metFORMIN (GLUCOPHAGE) 500 MG tablet; TAKE 1 TABLET  BY MOUTH DAILY WITH SUPPER.  Dispense: 30 tablet; Refill: 0 - Semaglutide-Weight Management (WEGOVY) 1.7 MG/0.75ML SOAJ; Inject 1.7 mg into the skin once a week.  Dispense: 3 mL; Refill: 0  3. Vitamin D deficiency Low Vitamin D level contributes to fatigue and are associated with obesity, breast, and colon cancer. We will refill prescription Vitamin D 50,000 IU every week and Zonia will follow-up for routine testing of Vitamin D, at least 2-3 times per year to avoid over-replacement.  - Vitamin D, Ergocalciferol, (DRISDOL) 1.25 MG (50000 UNIT) CAPS capsule; TAKE 1 CAPSULE BY MOUTH EVERY 7  DAYS.  Dispense: 4 capsule; Refill: 0  4. At risk for side effect of medication Sevyn was given approximately 15 minutes of drug side effect counseling today.  We discussed side effect possibility and risk versus benefits. Dana Williams agreed to the medication and will contact this office if these side effects are intolerable.  Repetitive spaced learning was employed today to elicit superior memory formation and behavioral change.   5. Obesity: Current BMI 38.94 Dana Williams is currently in the action stage of change. As such, her goal is to continue with weight loss efforts. She has agreed to the Category 3 Plan.   Dana Williams may increase Saxenda to 1.8 mg for one week and then may increase to  2.4 mg. She will switch to [facility] once she reaches 2.4 dose.  Exercise goals: No exercise has been prescribed at this time.  Behavioral modification strategies: meal planning and cooking strategies.  Dana Williams has agreed to follow-up with our clinic in 2 weeks.  Objective:   Blood pressure 136/67, pulse 94, temperature 98 F (36.7 C), height '5\' 5"'$  (1.651 m), weight 234 lb (106.1 kg), SpO2 96 %. Body mass index is 38.94 kg/m.  General: Cooperative, alert, well developed, in no acute distress. HEENT:  Conjunctivae and lids unremarkable. Cardiovascular: Regular rhythm.  Lungs: Normal work of breathing. Neurologic: No focal deficits.   Lab Results  Component Value Date   CREATININE 0.75 05/28/2021   BUN 11 05/28/2021   NA 143 05/28/2021   K 4.7 05/28/2021   CL 104 05/28/2021   CO2 24 05/28/2021   Lab Results  Component Value Date   ALT 11 05/28/2021   AST 11 05/28/2021   ALKPHOS 148 (H) 05/28/2021   BILITOT 0.3 05/28/2021   Lab Results  Component Value Date   HGBA1C 5.8 (H) 05/28/2021   HGBA1C 5.7 (H) 05/28/2020   HGBA1C 5.5 09/25/2019   HGBA1C 5.4 12/12/2018   HGBA1C 5.4 08/10/2018   Lab Results  Component Value Date   INSULIN 19.7 05/28/2021   INSULIN 19.4 05/28/2020   INSULIN 14.5 09/25/2019   INSULIN 11.5 12/12/2018   INSULIN 10.9 08/10/2018   Lab Results  Component Value Date   TSH 1.920 05/28/2020   Lab Results  Component Value Date   CHOL 186 05/28/2021   HDL 91 05/28/2021   LDLCALC 81 05/28/2021   TRIG 76 05/28/2021   Lab Results  Component Value Date   VD25OH 45.2 05/28/2021   VD25OH 41.0 05/28/2020   VD25OH 48.7 09/25/2019   Lab Results  Component Value Date   WBC 9.3 09/25/2019   HGB 12.5 09/25/2019   HCT 39.9 09/25/2019   MCV 89 09/25/2019   PLT 249 09/25/2019   No results found for: IRON, TIBC, FERRITIN  Attestation Statements:   Reviewed by clinician on day of visit: allergies, medications, problem list, medical history, surgical history, family history, social history, and previous encounter notes.  I, Lizbeth Bark, RMA, am acting as Location manager for [employer], [place].   I have reviewed the above documentation for accuracy and completeness, and I agree with the above. -  Georgianne Fick, FNP

## 2021-07-10 ENCOUNTER — Other Ambulatory Visit (HOSPITAL_COMMUNITY): Payer: Self-pay

## 2021-07-13 ENCOUNTER — Other Ambulatory Visit (HOSPITAL_COMMUNITY): Payer: Self-pay

## 2021-07-13 DIAGNOSIS — E7800 Pure hypercholesterolemia, unspecified: Secondary | ICD-10-CM | POA: Diagnosis not present

## 2021-07-13 DIAGNOSIS — E669 Obesity, unspecified: Secondary | ICD-10-CM | POA: Diagnosis not present

## 2021-07-13 DIAGNOSIS — F3341 Major depressive disorder, recurrent, in partial remission: Secondary | ICD-10-CM | POA: Diagnosis not present

## 2021-07-13 DIAGNOSIS — L4050 Arthropathic psoriasis, unspecified: Secondary | ICD-10-CM | POA: Diagnosis not present

## 2021-07-13 MED ORDER — ESOMEPRAZOLE MAGNESIUM 40 MG PO CPDR
DELAYED_RELEASE_CAPSULE | ORAL | 0 refills | Status: DC
  Filled 2021-07-13: qty 90, 90d supply, fill #0

## 2021-07-13 MED ORDER — ROSUVASTATIN CALCIUM 10 MG PO TABS
ORAL_TABLET | ORAL | 1 refills | Status: DC
  Filled 2021-07-13: qty 90, 90d supply, fill #0

## 2021-07-13 MED ORDER — SERTRALINE HCL 50 MG PO TABS
ORAL_TABLET | ORAL | 1 refills | Status: DC
  Filled 2021-07-13: qty 135, 90d supply, fill #0

## 2021-07-14 ENCOUNTER — Encounter (INDEPENDENT_AMBULATORY_CARE_PROVIDER_SITE_OTHER): Payer: Self-pay

## 2021-07-16 ENCOUNTER — Other Ambulatory Visit (HOSPITAL_COMMUNITY): Payer: Self-pay

## 2021-07-23 ENCOUNTER — Encounter (INDEPENDENT_AMBULATORY_CARE_PROVIDER_SITE_OTHER): Payer: Self-pay | Admitting: Family Medicine

## 2021-07-23 ENCOUNTER — Ambulatory Visit (INDEPENDENT_AMBULATORY_CARE_PROVIDER_SITE_OTHER): Payer: 59 | Admitting: Family Medicine

## 2021-07-23 ENCOUNTER — Other Ambulatory Visit: Payer: Self-pay

## 2021-07-23 ENCOUNTER — Other Ambulatory Visit (HOSPITAL_COMMUNITY): Payer: Self-pay

## 2021-07-23 VITALS — BP 132/79 | HR 97 | Temp 98.3°F | Ht 65.0 in | Wt 232.0 lb

## 2021-07-23 DIAGNOSIS — E559 Vitamin D deficiency, unspecified: Secondary | ICD-10-CM | POA: Diagnosis not present

## 2021-07-23 DIAGNOSIS — Z6839 Body mass index (BMI) 39.0-39.9, adult: Secondary | ICD-10-CM

## 2021-07-23 DIAGNOSIS — R7303 Prediabetes: Secondary | ICD-10-CM | POA: Diagnosis not present

## 2021-07-23 MED ORDER — VITAMIN D (ERGOCALCIFEROL) 1.25 MG (50000 UNIT) PO CAPS
ORAL_CAPSULE | ORAL | 0 refills | Status: DC
  Filled 2021-07-23 – 2021-08-07 (×2): qty 4, 28d supply, fill #0

## 2021-07-23 NOTE — Progress Notes (Signed)
Chief Complaint:   OBESITY Dana Williams is here to discuss her progress with her obesity treatment plan along with follow-up of her obesity related diagnoses. Dana Williams is on the Category 3 Plan and states she is following her eating plan approximately 50% of the time. Dana Williams states she is doing 0 minutes 0 times per week.  Today's visit was #: 49 Starting weight: 236 lbs Starting date: 08/10/2018 Today's weight: 232 lbs Today's date: 07/23/2021 Total lbs lost to date: 4 lbs Total lbs lost since last in-office visit: 2 lbs  Interim History: Dana Williams has been on vacation the past few weeks and has not been on plan. She is still wants to do better with meal planning and wants to discuss further today.  Subjective:   1. Pre-diabetes Dana Williams has not started the [facility]. She finished up her Ozempic. Her last A1C was 5.8.  Lab Results  Component Value Date   HGBA1C 5.8 (H) 05/28/2021   Lab Results  Component Value Date   INSULIN 19.7 05/28/2021   INSULIN 19.4 05/28/2020   INSULIN 14.5 09/25/2019   INSULIN 11.5 12/12/2018   INSULIN 10.9 08/10/2018    2. Vitamin D deficiency Dana Williams's Vitamin D was slightly low at 45.2. She is on weekly prescription Vitamin D.  Lab Results  Component Value Date   VD25OH 45.2 05/28/2021   VD25OH 41.0 05/28/2020   VD25OH 48.7 09/25/2019    Assessment/Plan:   1. Pre-diabetes Dana Williams will start the [facility] prescribed at the last office visit.   2. Vitamin D deficiency . We will refill prescription Vitamin D 50,000 IU every week for 1 month with no refills and Dana Williams will follow-up for routine testing of Vitamin D, at least 2-3 times per year to avoid over-replacement.  - Vitamin D, Ergocalciferol, (DRISDOL) 1.25 MG (50000 UNIT) CAPS capsule; TAKE 1 CAPSULE BY MOUTH EVERY 7  DAYS.  Dispense: 4 capsule; Refill: 0  3. Obesity: Current BMI 38.61 Dana Williams is currently in the action stage of change. As such, her goal is to continue with weight loss efforts. She has  agreed to the Category 3 Plan.   We discussed more options for meals.  Exercise goals: No exercise has been prescribed at this time.  Behavioral modification strategies: meal planning and cooking strategies and keeping healthy foods in the home.  Dana Williams has agreed to follow-up with our clinic in 2 weeks.  Objective:   Blood pressure 132/79, pulse 97, temperature 98.3 F (36.8 C), height '5\' 5"'$  (1.651 m), weight 232 lb (105.2 kg), SpO2 98 %. Body mass index is 38.61 kg/m.  General: Cooperative, alert, well developed, in no acute distress. HEENT: Conjunctivae and lids unremarkable. Cardiovascular: Regular rhythm.  Lungs: Normal work of breathing. Neurologic: No focal deficits.   Lab Results  Component Value Date   CREATININE 0.75 05/28/2021   BUN 11 05/28/2021   NA 143 05/28/2021   K 4.7 05/28/2021   CL 104 05/28/2021   CO2 24 05/28/2021   Lab Results  Component Value Date   ALT 11 05/28/2021   AST 11 05/28/2021   ALKPHOS 148 (H) 05/28/2021   BILITOT 0.3 05/28/2021   Lab Results  Component Value Date   HGBA1C 5.8 (H) 05/28/2021   HGBA1C 5.7 (H) 05/28/2020   HGBA1C 5.5 09/25/2019   HGBA1C 5.4 12/12/2018   HGBA1C 5.4 08/10/2018   Lab Results  Component Value Date   INSULIN 19.7 05/28/2021   INSULIN 19.4 05/28/2020   INSULIN 14.5 09/25/2019   INSULIN  11.5 12/12/2018   INSULIN 10.9 08/10/2018   Lab Results  Component Value Date   TSH 1.920 05/28/2020   Lab Results  Component Value Date   CHOL 186 05/28/2021   HDL 91 05/28/2021   LDLCALC 81 05/28/2021   TRIG 76 05/28/2021   Lab Results  Component Value Date   VD25OH 45.2 05/28/2021   VD25OH 41.0 05/28/2020   VD25OH 48.7 09/25/2019   Lab Results  Component Value Date   WBC 9.3 09/25/2019   HGB 12.5 09/25/2019   HCT 39.9 09/25/2019   MCV 89 09/25/2019   PLT 249 09/25/2019   No results found for: IRON, TIBC, FERRITIN  Attestation Statements:   Reviewed by clinician on day of visit: allergies,  medications, problem list, medical history, surgical history, family history, social history, and previous encounter notes.   I, Lizbeth Bark, RMA, am acting as Location manager for [employer], [place].   I have reviewed the above documentation for accuracy and completeness, and I agree with the above. -  Georgianne Fick, FNP

## 2021-08-06 ENCOUNTER — Ambulatory Visit (INDEPENDENT_AMBULATORY_CARE_PROVIDER_SITE_OTHER): Payer: 59 | Admitting: Family Medicine

## 2021-08-07 ENCOUNTER — Other Ambulatory Visit (HOSPITAL_COMMUNITY): Payer: Self-pay

## 2021-08-08 ENCOUNTER — Other Ambulatory Visit (HOSPITAL_COMMUNITY): Payer: Self-pay

## 2021-08-20 ENCOUNTER — Other Ambulatory Visit (HOSPITAL_COMMUNITY): Payer: Self-pay

## 2021-08-20 ENCOUNTER — Other Ambulatory Visit: Payer: Self-pay

## 2021-08-20 ENCOUNTER — Encounter (INDEPENDENT_AMBULATORY_CARE_PROVIDER_SITE_OTHER): Payer: Self-pay | Admitting: Family Medicine

## 2021-08-20 ENCOUNTER — Ambulatory Visit (INDEPENDENT_AMBULATORY_CARE_PROVIDER_SITE_OTHER): Payer: 59 | Admitting: Family Medicine

## 2021-08-20 VITALS — BP 121/75 | HR 98 | Temp 98.3°F | Ht 65.0 in | Wt 225.0 lb

## 2021-08-20 DIAGNOSIS — F3289 Other specified depressive episodes: Secondary | ICD-10-CM | POA: Diagnosis not present

## 2021-08-20 DIAGNOSIS — R7303 Prediabetes: Secondary | ICD-10-CM | POA: Diagnosis not present

## 2021-08-20 DIAGNOSIS — Z6839 Body mass index (BMI) 39.0-39.9, adult: Secondary | ICD-10-CM | POA: Diagnosis not present

## 2021-08-20 DIAGNOSIS — E559 Vitamin D deficiency, unspecified: Secondary | ICD-10-CM | POA: Diagnosis not present

## 2021-08-20 MED ORDER — WEGOVY 1.7 MG/0.75ML ~~LOC~~ SOAJ
1.7000 mg | SUBCUTANEOUS | 0 refills | Status: DC
Start: 2021-08-20 — End: 2022-06-19
  Filled 2021-08-20: qty 3, 28d supply, fill #0

## 2021-08-20 MED ORDER — BUPROPION HCL ER (XL) 300 MG PO TB24
300.0000 mg | ORAL_TABLET | Freq: Every day | ORAL | 0 refills | Status: DC
  Filled 2021-08-20: qty 90, 90d supply, fill #0

## 2021-08-20 MED ORDER — VITAMIN D (ERGOCALCIFEROL) 1.25 MG (50000 UNIT) PO CAPS
ORAL_CAPSULE | ORAL | 0 refills | Status: DC
Start: 2021-08-20 — End: 2022-02-04
  Filled 2021-08-20: qty 4, 28d supply, fill #0

## 2021-08-20 NOTE — Progress Notes (Signed)
Chief Complaint:   OBESITY Dana Williams is here to discuss her progress with her obesity treatment plan along with follow-up of her obesity related diagnoses. Dana Williams is on the Category 3 Plan and states she is following her eating plan approximately 75% of the time. Dana Williams states she is not currently exercising.  Today's visit was #: 40 Starting weight: 236 lbs Starting date: 08/10/2018 Today's weight: 225 lbs Today's date: 08/20/2021 Total lbs lost to date: 11 Total lbs lost since last in-office visit: 7  Interim History: Dana Williams feels she needs to improve her breakfast. She has been having a blueberry muffin. She packs her lunch and is having meat and vegetables for dinner.  Subjective:   1. Emotional eating Dana Williams's cravings are well controlled and mood is stable.  2. Vitamin D deficiency Dana Williams's Vit D is low at 45.2. She is currently taking prescription vitamin D 50,000 IU each week.   Lab Results  Component Value Date   VD25OH 45.2 05/28/2021   VD25OH 41.0 05/28/2020   VD25OH 48.7 09/25/2019   3. Pre-diabetes Dana Williams is on Wegovy 1.7 weekly and tolerating it well. Her appetite is well controlled.  Lab Results  Component Value Date   HGBA1C 5.8 (H) 05/28/2021   Lab Results  Component Value Date   INSULIN 19.7 05/28/2021   INSULIN 19.4 05/28/2020   INSULIN 14.5 09/25/2019   INSULIN 11.5 12/12/2018   INSULIN 10.9 08/10/2018   Assessment/Plan:   1. Emotional eating Dana Williams will continue Wellbutrin XL 300 mg as prescribed.  Refill- buPROPion (WELLBUTRIN XL) 300 MG 24 hr tablet; Take 1 tablet  by mouth daily.  Dispense: 90 tablet; Refill: 0  2. Vitamin D deficiency Dana Williams agrees to continue to take prescription Vitamin D 50,000 IU every week and will follow-up for routine testing of Vitamin D, at least 2-3 times per year to avoid over-replacement.  Refill- Vitamin D, Ergocalciferol, (DRISDOL) 1.25 MG (50000 UNIT) CAPS capsule; TAKE 1 CAPSULE BY MOUTH EVERY 7  DAYS.   Dispense: 4 capsule; Refill: 0  3. Pre-diabetes Continue Wegovy 1.7 mg weekly as prescribed.  Refill- Semaglutide-Weight Management (WEGOVY) 1.7 MG/0.75ML SOAJ; Inject 1.7 mg into the skin once a week.  Dispense: 3 mL; Refill: 0  4. Obesity: Current BMI 37.44  Dana Williams is currently in the action stage of change. As such, her goal is to continue with weight loss efforts. She has agreed to the Category 3 Plan.   Breakfast ideas provided.  Exercise goals: All adults should avoid inactivity. Some physical activity is better than none, and adults who participate in any amount of physical activity gain some health benefits.  Behavioral modification strategies: meal planning and cooking strategies.  Dana Williams has agreed to follow-up with our clinic in 2 weeks.  Objective:   Blood pressure 121/75, pulse 98, temperature 98.3 F (36.8 C), height 5\' 5"  (1.651 m), weight 225 lb (102.1 kg), SpO2 97 %. Body mass index is 37.44 kg/m.  General: Cooperative, alert, well developed, in no acute distress. HEENT: Conjunctivae and lids unremarkable. Cardiovascular: Regular rhythm.  Lungs: Normal work of breathing. Neurologic: No focal deficits.   Lab Results  Component Value Date   CREATININE 0.75 05/28/2021   BUN 11 05/28/2021   NA 143 05/28/2021   K 4.7 05/28/2021   CL 104 05/28/2021   CO2 24 05/28/2021   Lab Results  Component Value Date   ALT 11 05/28/2021   AST 11 05/28/2021   ALKPHOS 148 (H) 05/28/2021   BILITOT 0.3  05/28/2021   Lab Results  Component Value Date   HGBA1C 5.8 (H) 05/28/2021   HGBA1C 5.7 (H) 05/28/2020   HGBA1C 5.5 09/25/2019   HGBA1C 5.4 12/12/2018   HGBA1C 5.4 08/10/2018   Lab Results  Component Value Date   INSULIN 19.7 05/28/2021   INSULIN 19.4 05/28/2020   INSULIN 14.5 09/25/2019   INSULIN 11.5 12/12/2018   INSULIN 10.9 08/10/2018   Lab Results  Component Value Date   TSH 1.920 05/28/2020   Lab Results  Component Value Date   CHOL 186 05/28/2021    HDL 91 05/28/2021   LDLCALC 81 05/28/2021   TRIG 76 05/28/2021   Lab Results  Component Value Date   VD25OH 45.2 05/28/2021   VD25OH 41.0 05/28/2020   VD25OH 48.7 09/25/2019   Lab Results  Component Value Date   WBC 9.3 09/25/2019   HGB 12.5 09/25/2019   HCT 39.9 09/25/2019   MCV 89 09/25/2019   PLT 249 09/25/2019    Attestation Statements:   Reviewed by clinician on day of visit: allergies, medications, problem list, medical history, surgical history, family history, social history, and previous encounter notes.  Coral Ceo, CMA, am acting as Location manager for [employer], [place].  I have reviewed the above documentation for accuracy and completeness, and I agree with the above. -  Georgianne Fick, FNP

## 2021-08-29 ENCOUNTER — Other Ambulatory Visit (HOSPITAL_COMMUNITY): Payer: Self-pay

## 2021-09-21 ENCOUNTER — Ambulatory Visit (INDEPENDENT_AMBULATORY_CARE_PROVIDER_SITE_OTHER): Payer: 59 | Admitting: Family Medicine

## 2021-09-22 ENCOUNTER — Ambulatory Visit (INDEPENDENT_AMBULATORY_CARE_PROVIDER_SITE_OTHER): Payer: 59 | Admitting: Family Medicine

## 2021-09-22 ENCOUNTER — Encounter (INDEPENDENT_AMBULATORY_CARE_PROVIDER_SITE_OTHER): Payer: Self-pay

## 2021-09-23 ENCOUNTER — Encounter (INDEPENDENT_AMBULATORY_CARE_PROVIDER_SITE_OTHER): Payer: Self-pay

## 2021-10-05 ENCOUNTER — Other Ambulatory Visit (INDEPENDENT_AMBULATORY_CARE_PROVIDER_SITE_OTHER): Payer: Self-pay | Admitting: Family Medicine

## 2021-10-05 ENCOUNTER — Other Ambulatory Visit (HOSPITAL_COMMUNITY): Payer: Self-pay

## 2021-10-05 DIAGNOSIS — L821 Other seborrheic keratosis: Secondary | ICD-10-CM | POA: Diagnosis not present

## 2021-10-05 DIAGNOSIS — L853 Xerosis cutis: Secondary | ICD-10-CM | POA: Diagnosis not present

## 2021-10-05 DIAGNOSIS — D485 Neoplasm of uncertain behavior of skin: Secondary | ICD-10-CM | POA: Diagnosis not present

## 2021-10-05 DIAGNOSIS — D225 Melanocytic nevi of trunk: Secondary | ICD-10-CM | POA: Diagnosis not present

## 2021-10-05 DIAGNOSIS — L814 Other melanin hyperpigmentation: Secondary | ICD-10-CM | POA: Diagnosis not present

## 2021-10-05 DIAGNOSIS — L400 Psoriasis vulgaris: Secondary | ICD-10-CM | POA: Diagnosis not present

## 2021-10-05 DIAGNOSIS — Z85828 Personal history of other malignant neoplasm of skin: Secondary | ICD-10-CM | POA: Diagnosis not present

## 2021-10-05 DIAGNOSIS — E559 Vitamin D deficiency, unspecified: Secondary | ICD-10-CM

## 2021-10-05 DIAGNOSIS — D2271 Melanocytic nevi of right lower limb, including hip: Secondary | ICD-10-CM | POA: Diagnosis not present

## 2021-10-05 DIAGNOSIS — L718 Other rosacea: Secondary | ICD-10-CM | POA: Diagnosis not present

## 2021-10-05 MED ORDER — AZELAIC ACID 15 % EX GEL
CUTANEOUS | 1 refills | Status: DC
  Filled 2021-10-05: qty 50, 30d supply, fill #0
  Filled 2022-09-15: qty 50, 30d supply, fill #1

## 2021-10-05 MED ORDER — AMMONIUM LACTATE 12 % EX LOTN
TOPICAL_LOTION | Freq: Two times a day (BID) | CUTANEOUS | 2 refills | Status: DC
  Filled 2021-10-05: qty 226, 30d supply, fill #0
  Filled 2021-12-10 – 2022-09-15 (×3): qty 226, 30d supply, fill #1

## 2021-10-05 MED ORDER — CALCIPOTRIENE-BETAMETH DIPROP 0.005-0.064 % EX SUSP
CUTANEOUS | 2 refills | Status: DC
  Filled 2021-10-05: qty 60, 30d supply, fill #0
  Filled 2022-09-15: qty 60, 30d supply, fill #1

## 2021-10-05 MED FILL — Lactic Acid (Ammonium Lactate) Lotion 12%: CUTANEOUS | Qty: 400 | Fill #0 | Status: CN

## 2021-10-06 ENCOUNTER — Encounter (INDEPENDENT_AMBULATORY_CARE_PROVIDER_SITE_OTHER): Payer: Self-pay

## 2021-10-06 ENCOUNTER — Other Ambulatory Visit (HOSPITAL_COMMUNITY): Payer: Self-pay

## 2021-10-06 ENCOUNTER — Ambulatory Visit (INDEPENDENT_AMBULATORY_CARE_PROVIDER_SITE_OTHER): Payer: 59 | Admitting: Family Medicine

## 2021-10-06 DIAGNOSIS — H25813 Combined forms of age-related cataract, bilateral: Secondary | ICD-10-CM | POA: Diagnosis not present

## 2021-10-06 DIAGNOSIS — H5213 Myopia, bilateral: Secondary | ICD-10-CM | POA: Diagnosis not present

## 2021-10-06 DIAGNOSIS — H2513 Age-related nuclear cataract, bilateral: Secondary | ICD-10-CM | POA: Diagnosis not present

## 2021-10-06 DIAGNOSIS — H52223 Regular astigmatism, bilateral: Secondary | ICD-10-CM | POA: Diagnosis not present

## 2021-10-06 DIAGNOSIS — H524 Presbyopia: Secondary | ICD-10-CM | POA: Diagnosis not present

## 2021-12-10 ENCOUNTER — Other Ambulatory Visit (HOSPITAL_COMMUNITY): Payer: Self-pay

## 2021-12-19 ENCOUNTER — Other Ambulatory Visit (HOSPITAL_COMMUNITY): Payer: Self-pay

## 2021-12-28 ENCOUNTER — Other Ambulatory Visit (HOSPITAL_COMMUNITY): Payer: Self-pay

## 2021-12-28 DIAGNOSIS — K219 Gastro-esophageal reflux disease without esophagitis: Secondary | ICD-10-CM | POA: Diagnosis not present

## 2021-12-28 DIAGNOSIS — F3341 Major depressive disorder, recurrent, in partial remission: Secondary | ICD-10-CM | POA: Diagnosis not present

## 2021-12-28 DIAGNOSIS — G479 Sleep disorder, unspecified: Secondary | ICD-10-CM | POA: Diagnosis not present

## 2021-12-28 DIAGNOSIS — E7800 Pure hypercholesterolemia, unspecified: Secondary | ICD-10-CM | POA: Diagnosis not present

## 2021-12-28 DIAGNOSIS — R7303 Prediabetes: Secondary | ICD-10-CM | POA: Diagnosis not present

## 2021-12-28 DIAGNOSIS — E669 Obesity, unspecified: Secondary | ICD-10-CM | POA: Diagnosis not present

## 2021-12-28 MED ORDER — SERTRALINE HCL 100 MG PO TABS
ORAL_TABLET | ORAL | 1 refills | Status: DC
  Filled 2021-12-28: qty 90, 90d supply, fill #0

## 2021-12-28 MED ORDER — HYDROXYZINE HCL 10 MG PO TABS
ORAL_TABLET | ORAL | 1 refills | Status: DC
  Filled 2021-12-28: qty 90, 45d supply, fill #0

## 2021-12-28 MED ORDER — ROSUVASTATIN CALCIUM 10 MG PO TABS
ORAL_TABLET | ORAL | 1 refills | Status: AC
  Filled 2021-12-28: qty 90, 90d supply, fill #0
  Filled 2022-09-15: qty 90, 90d supply, fill #1

## 2021-12-29 ENCOUNTER — Other Ambulatory Visit (HOSPITAL_COMMUNITY): Payer: Self-pay

## 2021-12-29 DIAGNOSIS — E7800 Pure hypercholesterolemia, unspecified: Secondary | ICD-10-CM | POA: Diagnosis not present

## 2021-12-29 DIAGNOSIS — R7303 Prediabetes: Secondary | ICD-10-CM | POA: Diagnosis not present

## 2021-12-29 LAB — LIPID PANEL
Cholesterol: 170 (ref 0–200)
HDL: 87 — AB (ref 35–70)
LDL Cholesterol: 69
Triglycerides: 74 (ref 40–160)

## 2021-12-29 LAB — HEPATIC FUNCTION PANEL
ALT: 10 (ref 7–35)
AST: 12 — AB (ref 13–35)
Alkaline Phosphatase: 131 — AB (ref 25–125)
Bilirubin, Total: 0.3

## 2021-12-29 LAB — COMPREHENSIVE METABOLIC PANEL
Albumin: 3.9 (ref 3.5–5.0)
Calcium: 9.2 (ref 8.7–10.7)
eGFR: 93

## 2021-12-29 LAB — BASIC METABOLIC PANEL
BUN: 13 (ref 4–21)
CO2: 30 — AB (ref 13–22)
Chloride: 109 — AB (ref 99–108)
Creatinine: 0.7 (ref 0.5–1.1)
Glucose: 113
Potassium: 4.4 (ref 3.4–5.3)
Sodium: 145 (ref 137–147)

## 2021-12-29 LAB — HEMOGLOBIN A1C: Hemoglobin A1C: 6

## 2021-12-30 ENCOUNTER — Other Ambulatory Visit (INDEPENDENT_AMBULATORY_CARE_PROVIDER_SITE_OTHER): Payer: Self-pay

## 2022-02-04 ENCOUNTER — Ambulatory Visit (INDEPENDENT_AMBULATORY_CARE_PROVIDER_SITE_OTHER): Payer: 59 | Admitting: Family Medicine

## 2022-02-04 ENCOUNTER — Other Ambulatory Visit (INDEPENDENT_AMBULATORY_CARE_PROVIDER_SITE_OTHER): Payer: Self-pay | Admitting: Family Medicine

## 2022-02-04 ENCOUNTER — Encounter (INDEPENDENT_AMBULATORY_CARE_PROVIDER_SITE_OTHER): Payer: Self-pay | Admitting: Family Medicine

## 2022-02-04 ENCOUNTER — Other Ambulatory Visit: Payer: Self-pay

## 2022-02-04 ENCOUNTER — Other Ambulatory Visit (HOSPITAL_COMMUNITY): Payer: Self-pay

## 2022-02-04 VITALS — BP 116/75 | HR 90 | Temp 98.2°F | Ht 65.0 in | Wt 226.0 lb

## 2022-02-04 DIAGNOSIS — E559 Vitamin D deficiency, unspecified: Secondary | ICD-10-CM

## 2022-02-04 DIAGNOSIS — E669 Obesity, unspecified: Secondary | ICD-10-CM

## 2022-02-04 DIAGNOSIS — Z6837 Body mass index (BMI) 37.0-37.9, adult: Secondary | ICD-10-CM

## 2022-02-04 DIAGNOSIS — F3289 Other specified depressive episodes: Secondary | ICD-10-CM

## 2022-02-04 DIAGNOSIS — Z9189 Other specified personal risk factors, not elsewhere classified: Secondary | ICD-10-CM | POA: Diagnosis not present

## 2022-02-04 MED ORDER — VITAMIN D (ERGOCALCIFEROL) 1.25 MG (50000 UNIT) PO CAPS
ORAL_CAPSULE | ORAL | 0 refills | Status: DC
  Filled 2022-02-04: qty 4, 28d supply, fill #0

## 2022-02-04 MED ORDER — BUPROPION HCL ER (XL) 300 MG PO TB24
300.0000 mg | ORAL_TABLET | Freq: Every day | ORAL | 0 refills | Status: DC
  Filled 2022-02-04: qty 90, 90d supply, fill #0

## 2022-02-08 ENCOUNTER — Other Ambulatory Visit (HOSPITAL_COMMUNITY): Payer: Self-pay

## 2022-02-08 MED ORDER — WEGOVY 0.25 MG/0.5ML ~~LOC~~ SOAJ
0.2500 mg | SUBCUTANEOUS | 0 refills | Status: DC
Start: 2022-02-08 — End: 2022-06-19
  Filled 2022-02-08: qty 2, 28d supply, fill #0

## 2022-02-08 NOTE — Progress Notes (Signed)
? ? ? ?Chief Complaint:  ? ?OBESITY ?Dana Williams is here to discuss her progress with her obesity treatment plan along with follow-up of her obesity related diagnoses. Dana Williams is on the Category 3 Plan and states she is following her eating plan approximately 0% of the time. Dana Williams states she is doing 0 minutes 0 times per week. ? ?Today's visit was #: 65 ?Starting weight: 236 lbs ?Starting date: 08/10/2018 ?Today's weight: 226 lbs ?Today's date: 02/04/2022 ?Total lbs lost to date: 10 ?Total lbs lost since last in-office visit: 0 ? ?Interim History: Dana Williams has been off track with weight loss, but she is getting back on track now. She has tried a higher dose of Wegovy in the past and had GI upset. She may do well with a lower dose. ? ?Subjective:  ? ?1. Vitamin D deficiency ?Dana Williams is stable on Vitamin D. She has no recent labs, and she is at high risk of over-replacement. ? ?2. Emotional eating ?Dana Williams is doing well on her medications. She has sone well minimizing emotional eating behaviors overall even while not able to follow her plan as closely. ? ?3. At risk for diabetes mellitus ?Dana Williams is at higher than average risk for developing diabetes due to her obesity. ? ?Assessment/Plan:  ? ?1. Vitamin D deficiency ?We will check labs today, and we will refill prescription Vitamin D for 1 month. ? ?- Vitamin D, Ergocalciferol, (DRISDOL) 1.25 MG (50000 UNIT) CAPS capsule; TAKE 1 CAPSULE BY MOUTH EVERY 7  DAYS.  Dispense: 4 capsule; Refill: 0 ?- VITAMIN D 25 Hydroxy (Vit-D Deficiency, Fractures) ? ?2. Emotional eating ?We will refill Wellbutrin XL for 90 days with no refills. ? ?- buPROPion (WELLBUTRIN XL) 300 MG 24 hr tablet; Take 1 tablet  by mouth daily.  Dispense: 90 tablet; Refill: 0 ? ?3. At risk for diabetes mellitus ?Dana Williams was given approximately 15 minutes of diabetic education and counseling today. We discussed intensive lifestyle modifications today with an emphasis on weight loss as well as increasing exercise and  decreasing simple carbohydrates in her diet. We also reviewed medication options with an emphasis on risk versus benefits of those discussed. ? ?Repetitive spaced learning was employed today to elicit superior memory formation and behavioral change. ? ?4. Obesity: Current BMI 37.7 ?Dana Williams is currently in the action stage of change. As such, her goal is to continue with weight loss efforts. She has agreed to the Category 3 Plan.  ? ?We discussed various medication options to help Dana Williams with her weight loss efforts and we both agreed to start Wegovy at 0.25 mg q week with no refills. ? ?Behavioral modification strategies: increasing lean protein intake and meal planning and cooking strategies. ? ?Dana Williams has agreed to follow-up with our clinic in 2 weeks. She was informed of the importance of frequent follow-up visits to maximize her success with intensive lifestyle modifications for her multiple health conditions.  ? ?Dana Williams was informed we would discuss her lab results at her next visit unless there is a critical issue that needs to be addressed sooner. Dana Williams agreed to keep her next visit at the agreed upon time to discuss these results. ? ?Objective:  ? ?Blood pressure 116/75, pulse 90, temperature 98.2 ?F (36.8 ?C), height '5\' 5"'$  (1.651 m), weight 226 lb (102.5 kg), SpO2 98 %. ?Body mass index is 37.61 kg/m?. ? ?General: Cooperative, alert, well developed, in no acute distress. ?HEENT: Conjunctivae and lids unremarkable. ?Cardiovascular: Regular rhythm.  ?Lungs: Normal work of breathing. ?Neurologic: No focal deficits.  ? ?  Lab Results  ?Component Value Date  ? CREATININE 0.7 12/29/2021  ? BUN 13 12/29/2021  ? NA 145 12/29/2021  ? K 4.4 12/29/2021  ? CL 109 (A) 12/29/2021  ? CO2 30 (A) 12/29/2021  ? ?Lab Results  ?Component Value Date  ? ALT 10 12/29/2021  ? AST 12 (A) 12/29/2021  ? ALKPHOS 131 (A) 12/29/2021  ? BILITOT 0.3 05/28/2021  ? ?Lab Results  ?Component Value Date  ? HGBA1C 6.0 12/29/2021  ? HGBA1C 5.8 (H)  05/28/2021  ? HGBA1C 5.7 (H) 05/28/2020  ? HGBA1C 5.5 09/25/2019  ? HGBA1C 5.4 12/12/2018  ? ?Lab Results  ?Component Value Date  ? INSULIN 19.7 05/28/2021  ? INSULIN 19.4 05/28/2020  ? INSULIN 14.5 09/25/2019  ? INSULIN 11.5 12/12/2018  ? INSULIN 10.9 08/10/2018  ? ?Lab Results  ?Component Value Date  ? TSH 1.920 05/28/2020  ? ?Lab Results  ?Component Value Date  ? CHOL 170 12/29/2021  ? HDL 87 (A) 12/29/2021  ? [place] 69 12/29/2021  ? TRIG 74 12/29/2021  ? ?Lab Results  ?Component Value Date  ? VD25OH 45.2 05/28/2021  ? VD25OH 41.0 05/28/2020  ? VD25OH 48.7 09/25/2019  ? ?Lab Results  ?Component Value Date  ? WBC 9.3 09/25/2019  ? HGB 12.5 09/25/2019  ? HCT 39.9 09/25/2019  ? MCV 89 09/25/2019  ? PLT 249 09/25/2019  ? ?No results found for: IRON, TIBC, FERRITIN ? ?Attestation Statements:  ? ?Reviewed by clinician on day of visit: allergies, medications, problem list, medical history, surgical history, family history, social history, and previous encounter notes. ? ? ?I, Trixie Dredge, am acting as transcriptionist for Dennard Nip, MD. ? ?I have reviewed the above documentation for accuracy and completeness, and I agree with the above. -  Dennard Nip, MD ? ? ?

## 2022-02-11 ENCOUNTER — Telehealth (INDEPENDENT_AMBULATORY_CARE_PROVIDER_SITE_OTHER): Payer: Self-pay | Admitting: Family Medicine

## 2022-02-11 ENCOUNTER — Encounter (INDEPENDENT_AMBULATORY_CARE_PROVIDER_SITE_OTHER): Payer: Self-pay

## 2022-02-11 NOTE — Telephone Encounter (Signed)
Prior authorization approved for [facility]. Effective:  02/11/2022 to 05/12/2022. Patient sent approval message via mychart.  ?

## 2022-02-12 ENCOUNTER — Other Ambulatory Visit (HOSPITAL_COMMUNITY): Payer: Self-pay

## 2022-02-15 ENCOUNTER — Other Ambulatory Visit (HOSPITAL_COMMUNITY): Payer: Self-pay

## 2022-02-15 MED ORDER — ESOMEPRAZOLE MAGNESIUM 40 MG PO CPDR
40.0000 mg | DELAYED_RELEASE_CAPSULE | Freq: Every day | ORAL | 1 refills | Status: DC
  Filled 2022-02-15: qty 90, 90d supply, fill #0
  Filled 2022-08-20: qty 90, 90d supply, fill #1

## 2022-02-18 ENCOUNTER — Telehealth (INDEPENDENT_AMBULATORY_CARE_PROVIDER_SITE_OTHER): Payer: 59 | Admitting: Family Medicine

## 2022-02-19 ENCOUNTER — Other Ambulatory Visit (HOSPITAL_COMMUNITY): Payer: Self-pay

## 2022-03-04 ENCOUNTER — Ambulatory Visit (INDEPENDENT_AMBULATORY_CARE_PROVIDER_SITE_OTHER): Payer: 59 | Admitting: Family Medicine

## 2022-06-18 DIAGNOSIS — R000 Tachycardia, unspecified: Secondary | ICD-10-CM | POA: Diagnosis not present

## 2022-06-19 ENCOUNTER — Encounter (HOSPITAL_COMMUNITY): Payer: Self-pay

## 2022-06-19 ENCOUNTER — Observation Stay (HOSPITAL_COMMUNITY)
Admission: EM | Admit: 2022-06-19 | Discharge: 2022-06-21 | Disposition: A | Discharge status: Home / Self Care | Payer: 59 | Attending: Internal Medicine | Admitting: Internal Medicine

## 2022-06-19 ENCOUNTER — Other Ambulatory Visit: Payer: Self-pay

## 2022-06-19 DIAGNOSIS — Z96652 Presence of left artificial knee joint: Secondary | ICD-10-CM | POA: Insufficient documentation

## 2022-06-19 DIAGNOSIS — F32A Depression, unspecified: Secondary | ICD-10-CM | POA: Diagnosis not present

## 2022-06-19 DIAGNOSIS — Z6838 Body mass index (BMI) 38.0-38.9, adult: Secondary | ICD-10-CM | POA: Diagnosis present

## 2022-06-19 DIAGNOSIS — Z79899 Other long term (current) drug therapy: Secondary | ICD-10-CM | POA: Diagnosis not present

## 2022-06-19 DIAGNOSIS — D649 Anemia, unspecified: Principal | ICD-10-CM

## 2022-06-19 DIAGNOSIS — E669 Obesity, unspecified: Secondary | ICD-10-CM

## 2022-06-19 DIAGNOSIS — R7303 Prediabetes: Secondary | ICD-10-CM

## 2022-06-19 DIAGNOSIS — Z8719 Personal history of other diseases of the digestive system: Secondary | ICD-10-CM

## 2022-06-19 DIAGNOSIS — Z7985 Long-term (current) use of injectable non-insulin antidiabetic drugs: Secondary | ICD-10-CM | POA: Insufficient documentation

## 2022-06-19 DIAGNOSIS — F419 Anxiety disorder, unspecified: Secondary | ICD-10-CM

## 2022-06-19 DIAGNOSIS — Z86010 Personal history of colonic polyps: Secondary | ICD-10-CM

## 2022-06-19 LAB — CBC
HCT: 21 % — ABNORMAL LOW (ref 36.0–46.0)
Hemoglobin: 5.3 g/dL — CL (ref 12.0–15.0)
MCH: 16.6 pg — ABNORMAL LOW (ref 26.0–34.0)
MCHC: 25.2 g/dL — ABNORMAL LOW (ref 30.0–36.0)
MCV: 65.6 fL — ABNORMAL LOW (ref 80.0–100.0)
Platelets: 362 10*3/uL (ref 150–400)
RBC: 3.2 MIL/uL — ABNORMAL LOW (ref 3.87–5.11)
RDW: 19 % — ABNORMAL HIGH (ref 11.5–15.5)
WBC: 5.2 10*3/uL (ref 4.0–10.5)
nRBC: 0.4 % — ABNORMAL HIGH (ref 0.0–0.2)

## 2022-06-19 LAB — IRON AND TIBC
Iron: 25 ug/dL — ABNORMAL LOW (ref 28–170)
Saturation Ratios: 4 % — ABNORMAL LOW (ref 10.4–31.8)
TIBC: 679 ug/dL — ABNORMAL HIGH (ref 250–450)
UIBC: 654 ug/dL

## 2022-06-19 LAB — POC OCCULT BLOOD, ED: Fecal Occult Bld: NEGATIVE

## 2022-06-19 LAB — COMPREHENSIVE METABOLIC PANEL
ALT: 10 U/L (ref 0–44)
AST: 16 U/L (ref 15–41)
Albumin: 3.2 g/dL — ABNORMAL LOW (ref 3.5–5.0)
Alkaline Phosphatase: 111 U/L (ref 38–126)
Anion gap: 9 (ref 5–15)
BUN: 10 mg/dL (ref 8–23)
CO2: 22 mmol/L (ref 22–32)
Calcium: 8.9 mg/dL (ref 8.9–10.3)
Chloride: 107 mmol/L (ref 98–111)
Creatinine, Ser: 0.8 mg/dL (ref 0.44–1.00)
GFR, Estimated: >60 mL/min (ref >60)
Glucose, Bld: 193 mg/dL — ABNORMAL HIGH (ref 70–99)
Potassium: 3.6 mmol/L (ref 3.5–5.1)
Sodium: 138 mmol/L (ref 135–145)
Total Bilirubin: 0.5 mg/dL (ref 0.3–1.2)
Total Protein: 6.3 g/dL — ABNORMAL LOW (ref 6.5–8.1)

## 2022-06-19 LAB — FERRITIN: Ferritin: 3 ng/mL — ABNORMAL LOW (ref 11–307)

## 2022-06-19 LAB — PREPARE RBC (CROSSMATCH)

## 2022-06-19 LAB — FOLATE: Folate: 8 ng/mL (ref >5.9)

## 2022-06-19 LAB — VITAMIN B12: Vitamin B-12: 128 pg/mL — ABNORMAL LOW (ref 180–914)

## 2022-06-19 MED ORDER — HYDROXYZINE HCL 10 MG PO TABS
10.0000 mg | ORAL_TABLET | Freq: Every day | ORAL | Status: DC
  Administered 2022-06-19 – 2022-06-20 (×2): 10 mg via ORAL
  Filled 2022-06-19 (×2): qty 1

## 2022-06-19 MED ORDER — ALBUTEROL SULFATE (2.5 MG/3ML) 0.083% IN NEBU: 2.5000 mg | INHALATION_SOLUTION | Freq: Four times a day (QID) | RESPIRATORY_TRACT | Status: DC | PRN

## 2022-06-19 MED ORDER — SODIUM CHLORIDE 0.9 % IV SOLN
10.0000 mL/h | Freq: Once | INTRAVENOUS | Status: AC
  Administered 2022-06-19: 10 mL/h via INTRAVENOUS

## 2022-06-19 MED ORDER — ONDANSETRON HCL 4 MG/2ML IJ SOLN: 4.0000 mg | Freq: Four times a day (QID) | INTRAMUSCULAR | Status: DC | PRN

## 2022-06-19 MED ORDER — PANTOPRAZOLE SODIUM 40 MG PO TBEC
40.0000 mg | DELAYED_RELEASE_TABLET | Freq: Every day | ORAL | Status: DC
  Administered 2022-06-20 – 2022-06-21 (×2): 40 mg via ORAL
  Filled 2022-06-19 (×2): qty 1

## 2022-06-19 MED ORDER — ACETAMINOPHEN 650 MG RE SUPP: 650.0000 mg | Freq: Four times a day (QID) | RECTAL | Status: DC | PRN

## 2022-06-19 MED ORDER — ROSUVASTATIN CALCIUM 5 MG PO TABS
10.0000 mg | ORAL_TABLET | Freq: Every evening | ORAL | Status: DC
  Administered 2022-06-20: 10 mg via ORAL
  Filled 2022-06-19: qty 2

## 2022-06-19 MED ORDER — SERTRALINE HCL 100 MG PO TABS
100.0000 mg | ORAL_TABLET | Freq: Every day | ORAL | Status: DC
  Administered 2022-06-19 – 2022-06-20 (×2): 100 mg via ORAL
  Filled 2022-06-19 (×2): qty 1

## 2022-06-19 MED ORDER — SODIUM CHLORIDE 0.9 % IV SOLN: 510.0000 mg | Freq: Once | INTRAVENOUS | Status: DC

## 2022-06-19 MED ORDER — NA FERRIC GLUC CPLX IN SUCROSE 12.5 MG/ML IV SOLN
250.0000 mg | Freq: Every day | INTRAVENOUS | Status: AC
  Administered 2022-06-20 (×2): 250 mg via INTRAVENOUS
  Filled 2022-06-19 (×3): qty 20

## 2022-06-19 MED ORDER — ACETAMINOPHEN 325 MG PO TABS: 650.0000 mg | ORAL_TABLET | Freq: Four times a day (QID) | ORAL | Status: DC | PRN

## 2022-06-19 MED ORDER — ONDANSETRON HCL 4 MG PO TABS: 4.0000 mg | ORAL_TABLET | Freq: Four times a day (QID) | ORAL | Status: DC | PRN

## 2022-06-19 MED ORDER — SODIUM CHLORIDE 0.9% FLUSH
3.0000 mL | Freq: Two times a day (BID) | INTRAVENOUS | Status: DC
  Administered 2022-06-19 – 2022-06-21 (×4): 3 mL via INTRAVENOUS

## 2022-06-19 NOTE — H&P (Signed)
History and Physical    Patient: Dana Williams NKN:[id number] DOB: 1961/05/24 DOA: 06/19/2022 DOS: the patient was seen and examined on 06/19/2022 PCP: Aretta Nip, MD  Patient coming from: Home  Chief Complaint:  Chief Complaint  Patient presents with   abnormal labs   HPI: Dana Williams is a 61 y.o. female with medical history significant of hyperlipidemia, kidney stones, arthritis, obesity, and GERD who presents after being found to have abnormal hemoglobin.  She works in the surgical short stay and notes that when walking into the hospital she had been little short of breath.  Symptoms usually resolve quickly with rest.  However, she had been attributing symptoms to her weight.  She reports that her hemoglobin was 12.7 back in March of this year.  Patient did report the other day that when she went to work her heart rate was into the 379K and her systolic blood pressure was elevated into the 150s.  She had followed up with her primary care provider yesterday.  They had performed a chest x-ray and EKG which were within normal limits.  Due to her symptoms blood work was obtained and she was called this morning and told that her hemoglobin was 5.6.  Denies having any chest pain, nausea, vomiting, abdominal pain, diarrhea, or blood in her stool/urine.  She denies any routine use of NSAIDs and she was previously on Celebrex, but that was discontinued approximately 9 months ago.  She last had a colonoscopy with Dr. Arelia Longest in 2015 where she was found to have a 2 mm sessile polyp which was removed and moderate diverticulosis.  Patient was recommended to have repeat colonoscopy in 7 years.  She also is postmenopausal since the age of 71.   On admission into the emergency department patient was noted to be afebrile but tachycardic and tachypneic with all other vital signs maintained.  Labs significant for hemoglobin 5.3 and glucose 193.  Stool guaiacs were negative.  Patient was typed and screened  and ordered 1 unit of packed red blood cells.  Review of Systems: As mentioned in the history of present illness. All other systems reviewed and are negative. Past Medical History:  Diagnosis Date   Arthritis    Arthritis    Arthritis of pelvic region    Depression    Dry skin    Erythema multiforme    Fatigue    GERD (gastroesophageal reflux disease)    H/O psoriatic arthritis    DX  age 84---- in remission for 20 years   History of hiatal hernia    History of kidney stones    Hyperlipidemia    Joint pain    Kidney stones    Obesity    Personal history of colonic polyp - adenoma 01/29/2014   Plantar fasciitis of left foot    PONV (postoperative nausea and vomiting)    Postmenopausal    Psoriasis    Renal calculus, left    Sigmoid diverticulosis    Urticaria    Vitamin D deficiency    Past Surgical History:  Procedure Laterality Date   CLOSED MANIPULATION SHOULDER     COLONOSCOPY WITH PROPOFOL  01-25-2014   CYSTO/  BILATERAL RETROGRADE PYELOGRAM/  BILATERAL URETEROSCOPIC LASER LITHOTRIPSY STONE EXTRACTIONS/  BILATERAL STENT PLACEMENT  02-13-2010   CYSTOSCOPY WITH RETROGRADE PYELOGRAM, URETEROSCOPY AND STENT PLACEMENT Left 04/21/2015   Procedure: CYSTOSCOPY WITH RETROGRADE PYELOGRAM, URETEROSCOPY, STONE EXTRACTION AND STENT PLACEMENT;  Surgeon: Franchot Gallo, MD;  Location: [place];  Service: Urology;  Laterality: Left;   CYSTOURETHROSCOPY/  PLACEMENT LYNX SUPRAPUBIC SLING  05-24-2008   HAND TENDON SURGERY Right 1993   HOLMIUM LASER APPLICATION Left 07/24/2425   Procedure: HOLMIUM LASER APPLICATION;  Surgeon: Franchot Gallo, MD;  Location: [facility];  Service: Urology;  Laterality: Left;   LAPAROSCOPIC CHOLECYSTECTOMY  01-31-2002   LEFT URETEROSCOPIC STONE EXTRACTION/  STENT PLACEMENT  10-06-2005   RHINOPLASTY  2007   RHINOPLASTY     STRABISMUS SURGERY Left age 59   TOTAL KNEE ARTHROPLASTY Left 10/27/2016   Procedure: TOTAL KNEE  ARTHROPLASTY;  Surgeon: [name], MD;  Location: [place];  Service: Orthopedics;  Laterality: Left;   WISDOM TOOTH EXTRACTION  age 81   Social History:  reports that she has never smoked. She has never used smokeless tobacco. She reports that she does not drink alcohol and does not use drugs.  Allergies  Allergen Reactions   Sulfa Antibiotics Other (See Comments)    Mouth ulcers    Family History  Problem Relation Age of Onset   Stomach cancer Paternal Grandmother    Hypertension Mother    Hyperlipidemia Mother    Thyroid disease Mother    Obesity Mother    Colon cancer Neg Hx    Esophageal cancer Neg Hx    Rectal cancer Neg Hx    Allergic rhinitis Neg Hx    Asthma Neg Hx    Eczema Neg Hx    Urticaria Neg Hx     Prior to Admission medications   Medication Sig Start Date End Date Taking? Authorizing Provider  ammonium lactate (LAC-HYDRIN) 12 % lotion Apply on the skin twice a day 10/05/21     Azelaic Acid (FINACEA) 15 % gel Apply to affected area daily 10/05/21     buPROPion (WELLBUTRIN XL) 300 MG 24 hr tablet Take 1 tablet  by mouth daily. 02/04/22   Dennard Nip D, MD  calcipotriene-betamethasone [facility] SCALP) external suspension Apply 1 application topically daily as needed.  06/05/14   [provider]  calcipotriene-betamethasone Donita Brooks) external suspension Apply to affected area daily 10/05/21     esomeprazole (NEXIUM) 40 MG capsule TAKE 1 CAPSULE BY MOUTH ONCE A DAY 11/29/19 11/28/20  Christa See, FNP  esomeprazole (NEXIUM) 40 MG capsule Take 1 capsule by mouth once a day 02/15/22     hydrOXYzine (ATARAX) 10 MG tablet Take 2 tablets by mouth at bedtime 12/28/21     Multiple Vitamins-Minerals (MULTIVITAMIN/EXTRA VITAMIN D3) CHEW Chew 2 tablets by mouth daily.    [provider]  rosuvastatin (CRESTOR) 10 MG tablet Take 1 tablet by mouth once a day needs appt for refills 07/13/21     rosuvastatin (CRESTOR) 10 MG tablet Take 1 tablet by mouth once  daily 12/28/21     Semaglutide-Weight Management (WEGOVY) 0.25 MG/0.5ML SOAJ Inject 0.25 mg into the skin once a week. 02/08/22   Dennard Nip D, MD  Semaglutide-Weight Management (WEGOVY) 1.7 MG/0.75ML SOAJ Inject 1.7 mg into the skin once a week. 08/20/21   [place], Joneen Boers, FNP  sertraline (ZOLOFT) 100 MG tablet Take 1 tablet by mouth once daily 12/28/21     sertraline (ZOLOFT) 50 MG tablet TAKE 1 TABLET BY MOUTH ONCE A DAY 11/29/19 11/28/20  Christa See, FNP  sertraline (ZOLOFT) 50 MG tablet Take 1 & 2 tablets by mouth once daily 07/13/21     Vitamin D, Ergocalciferol, (DRISDOL) 1.25 MG (50000 UNIT) CAPS capsule TAKE 1 CAPSULE BY MOUTH EVERY 7  DAYS. 02/04/22 02/04/23  Dennard Nip D, MD    Physical Exam: Vitals:   06/19/22 1630 06/19/22 1637 06/19/22 1659 06/19/22 1700  BP: (!) 160/114 119/77  126/78  Pulse: 94 94  95  Resp: '19 20  20  '$ Temp:   98.9 F (37.2 C)   TempSrc:   Oral   SpO2: 96% 99%  98%  Weight:      Height:          Constitutional: Older adult female currently in no acute distress. Eyes: PERRL, lids and conjunctivae normal ENMT: Mucous membranes are moist.  . Neck: normal, supple.  No JVD. Respiratory: clear to auscultation bilaterally, no wheezing, no crackles. Normal respiratory effort.  Cardiovascular: Regular rate and rhythm, no murmurs / rubs / gallops. No extremity edema. 2+ pedal pulses.    Abdomen: no tenderness, no masses palpated.  Bowel sounds positive.  Musculoskeletal: no clubbing / cyanosis. No joint deformity upper and lower extremities.   Skin: Pallor present. Neurologic: CN 2-12 grossly intact.  Strength 5/5 in all 4.  Psychiatric: Normal judgment and insight. Alert and oriented x 3. Normal mood.   Data Reviewed:  Reviewed labs and imaging as noted above in HPI.  Assessment and Plan: Symptomatic anemia Acute.  Patient found to have globin down to 5.3 with low MCV and MCH.  Patient denies any reports of bleeding or NSAID use.  Stool guaiac  negative.  Last colonoscopy in 2015 with Dr. Arelia Longest revealed 2 mm sessile polyp which was removed and moderate sigmoid diverticulosis.  She had been typed and screened and ordered 1 unit of packed red blood cells.  Low MCV and MCH suggest iron deficiency anemia. -Admit to a medical telemetry bed -Follow-up anemia panel -Orders changed to transfuse 2 units of packed red -Recheck H&H posttransfusion -Feraheme infusion ordered for in a.m.  History of polyps diverticulosis As seen above. -Patient should follow-up with Dr. Carlean Purl in the outpatient setting to have colonoscopy and/or EGD for further work-up as she is overdue  Prediabetes On admission glucose elevated at 193.  Last hemoglobin A1c was 6 in 12/2021. -add-on Hemoglobin A1c  Anxiety and depression -Continue Zoloft and hydroxyzine nightly  GERD -Continue pharmacy substitution of Protonix  Obesity BMI 34.22 kg/m  DVT prophylaxis: SCDs Advance Care Planning:   Code Status: Full Code   Consults: None   Severity of Illness: The appropriate patient status for this patient is OBSERVATION. Observation status is judged to be reasonable and necessary in order to provide the required intensity of service to ensure the patient's safety. The patient's presenting symptoms, physical exam findings, and initial radiographic and laboratory data in the context of their medical condition is felt to place them at decreased risk for further clinical deterioration. Furthermore, it is anticipated that the patient will be medically stable for discharge from the hospital within 2 midnights of admission.   Author: Norval Morton, MD 06/19/2022 5:20 PM  For on call review [email or web address].

## 2022-06-19 NOTE — ED Provider Notes (Signed)
[facility] EMERGENCY DEPARTMENT Provider Note   CSN: [id number] Arrival date & time: 06/19/22  1406     History  Chief Complaint  Patient presents with   abnormal labs    Dana Williams is a 61 y.o. female.  Patient is a 61 year old female with a past medical history of GERD presenting to the emergency department with anemia.  The patient states that she had increasing dyspnea on exertion the last few days and was seen by her primary doctor yesterday who performed an EKG, chest x-ray and labs.  She states she was told the EKG and chest x-ray were normal and were called today to let her know that her hemoglobin was 5.5 and recommended that she come to the emergency department for blood transfusion.  Patient states she has never had a transfusion before.  She denies any black or bloody stools, nausea, vomiting, hematemesis, chest pain or abdominal pain or hemoptysis.  She denies any recent easy bleeding.  She denies any blood thinner use.  He states she had a colonoscopy about 7 years ago and had 1 polyp removed and was told that she did not need a repeat one for 10 years.  The history is provided by the patient.       Home Medications     Allergies    Sulfa antibiotics    Review of Systems   Review of Systems  Physical Exam Updated Vital Signs BP (!) 150/75   Pulse (!) 119   Temp 98.7 F (37.1 C) (Oral)   Resp 18   Ht '5\' 6"'$  (1.676 m)   Wt 96.2 kg   SpO2 96%   BMI 34.22 kg/m  Physical Exam Vitals and nursing note reviewed.  Constitutional:      Appearance: Normal appearance. She is obese.  HENT:     Head: Normocephalic and atraumatic.     Nose: Nose normal.     Mouth/Throat:     Mouth: Mucous membranes are moist.  Eyes:     Extraocular Movements: Extraocular movements intact.     Conjunctiva/sclera: Conjunctivae normal.  Cardiovascular:     Rate and Rhythm: Regular rhythm. Tachycardia present.  Pulmonary:     Effort: Pulmonary effort is  normal.     Breath sounds: Normal breath sounds.  Abdominal:     General: Abdomen is flat.     Palpations: Abdomen is soft.  Musculoskeletal:        General: Normal range of motion.     Cervical back: Normal range of motion and neck supple.  Skin:    General: Skin is warm and dry.  Neurological:     General: No focal deficit present.     Mental Status: She is alert and oriented to person, place, and time.  Psychiatric:        Mood and Affect: Mood normal.        Behavior: Behavior normal.     ED Results / Procedures / Treatments   Labs (all labs ordered are listed, but only abnormal results are displayed) Labs Reviewed  CBC - Abnormal; Notable for the following components:      Result Value   RBC 3.20 (*)    Hemoglobin 5.3 (*)    HCT 21.0 (*)    MCV 65.6 (*)    MCH 16.6 (*)    MCHC 25.2 (*)    RDW 19.0 (*)    nRBC 0.4 (*)    All other components within normal limits  COMPREHENSIVE METABOLIC PANEL - Abnormal; Notable for the following components:   Glucose, Bld 193 (*)    Total Protein 6.3 (*)    Albumin 3.2 (*)    All other components within normal limits  VITAMIN B12  FOLATE  IRON AND TIBC  FERRITIN  RETICULOCYTES  POC OCCULT BLOOD, ED  TYPE AND SCREEN  PREPARE RBC (CROSSMATCH)    EKG None  Radiology No results found.  Procedures Procedures    Medications Ordered in ED Medications  0.9 %  sodium chloride infusion (has no administration in time range)    ED Course/ Medical Decision Making/ A&P                           Medical Decision Making Patient is a 61 year old female with a past medical history of GERD presenting to the emergency department with anemia.  Patient had repeat labs performed by triage provider to evaluate for anemia here as well as signs of dehydration, CKD or other coagulopathy.  The patient hemoglobin is 5.6 here.  She will require blood transfusion was consented by myself.  The patient will additionally have anemia labs and  Hemoccult performed and will acquire admission for further work-up for new onset anemia.  Amount and/or Complexity of Data Reviewed Labs: ordered.  Risk Prescription drug management. Decision regarding hospitalization.   Labs reviewed and interpreted by myself show anemia with a hemoglobin of 5.3.  The patient was consented for blood and will be transfused.  Hemoccult was performed which was negative.  She will be admitted to hospitalist for further management of her anemia.  Patient is amenable to plan.   \    Final Clinical Impression(s) / ED Diagnoses Final diagnoses:  None    Rx / DC Orders ED Discharge Orders     None         Ottie Glazier, DO 06/19/22 2345

## 2022-06-19 NOTE — ED Triage Notes (Signed)
Pt arrived POV from home stating her henmoglobin came back yesterday at 5.5. 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

## 2022-06-20 DIAGNOSIS — Z79899 Other long term (current) drug therapy: Secondary | ICD-10-CM | POA: Diagnosis not present

## 2022-06-20 DIAGNOSIS — Z96652 Presence of left artificial knee joint: Secondary | ICD-10-CM | POA: Diagnosis not present

## 2022-06-20 DIAGNOSIS — R7303 Prediabetes: Secondary | ICD-10-CM | POA: Diagnosis not present

## 2022-06-20 DIAGNOSIS — Z7985 Long-term (current) use of injectable non-insulin antidiabetic drugs: Secondary | ICD-10-CM | POA: Diagnosis not present

## 2022-06-20 DIAGNOSIS — D649 Anemia, unspecified: Secondary | ICD-10-CM | POA: Diagnosis not present

## 2022-06-20 LAB — BASIC METABOLIC PANEL
Anion gap: 5 (ref 5–15)
BUN: 7 mg/dL — ABNORMAL LOW (ref 8–23)
CO2: 26 mmol/L (ref 22–32)
Calcium: 8.7 mg/dL — ABNORMAL LOW (ref 8.9–10.3)
Chloride: 107 mmol/L (ref 98–111)
Creatinine, Ser: 0.7 mg/dL (ref 0.44–1.00)
GFR, Estimated: >60 mL/min (ref >60)
Glucose, Bld: 107 mg/dL — ABNORMAL HIGH (ref 70–99)
Potassium: 3.8 mmol/L (ref 3.5–5.1)
Sodium: 138 mmol/L (ref 135–145)

## 2022-06-20 LAB — HIV ANTIBODY (ROUTINE TESTING W REFLEX): HIV Screen 4th Generation wRfx: NONREACTIVE

## 2022-06-20 LAB — CBC
HCT: 22.2 % — ABNORMAL LOW (ref 36.0–46.0)
Hemoglobin: 6.4 g/dL — CL (ref 12.0–15.0)
MCH: 19.2 pg — ABNORMAL LOW (ref 26.0–34.0)
MCHC: 28.8 g/dL — ABNORMAL LOW (ref 30.0–36.0)
MCV: 66.7 fL — ABNORMAL LOW (ref 80.0–100.0)
Platelets: 318 10*3/uL (ref 150–400)
RBC: 3.33 MIL/uL — ABNORMAL LOW (ref 3.87–5.11)
RDW: 20.9 % — ABNORMAL HIGH (ref 11.5–15.5)
WBC: 5.4 10*3/uL (ref 4.0–10.5)
nRBC: 0 % (ref 0.0–0.2)

## 2022-06-20 LAB — HEMOGLOBIN AND HEMATOCRIT, BLOOD
HCT: 30.3 % — ABNORMAL LOW (ref 36.0–46.0)
Hemoglobin: 8.9 g/dL — ABNORMAL LOW (ref 12.0–15.0)

## 2022-06-20 LAB — PREPARE RBC (CROSSMATCH)

## 2022-06-20 LAB — HEMOGLOBIN A1C
Hgb A1c MFr Bld: 5.3 % (ref 4.8–5.6)
Mean Plasma Glucose: 105.41 mg/dL

## 2022-06-20 MED ORDER — ORAL CARE MOUTH RINSE: 15.0000 mL | OROMUCOSAL | Status: DC | PRN

## 2022-06-20 MED ORDER — SODIUM CHLORIDE 0.9% IV SOLUTION: Freq: Once | INTRAVENOUS | Status: AC

## 2022-06-20 NOTE — Progress Notes (Signed)
PROGRESS NOTE    Dana Williams  ZOX:[id number] DOB: [birth year] DOA: 06/19/2022 PCP: Clayborn Heron, MD   Brief Narrative:  Dana Williams is a 61 y.o. female with medical history significant of hyperlipidemia, kidney stones, arthritis, obesity, and GERD who presents after being found to have abnormal hemoglobin - denies overt bleeding; ROS positive for fatigue/weakness and tachycardia.  Assessment & Plan:   Principal Problem:   Symptomatic anemia Active Problems:   History of colon polyps   History of diverticulosis   Prediabetes   Anxiety and depression   History of gastroesophageal reflux (GERD)   Obesity (BMI 30-39.9)  Acute symptomatic anemia, POA - Hgb 5.3 at intake -2 unit PRBC transfused overnight, repeat hemoglobin 6.4 - 2 additional units transfused earlier this morning - Stool guaiac negative.  Last colonoscopy in 2015 with Dr. Concha Se revealed 2 mm sessile polyp which was removed and moderate sigmoid diverticulosis - Concern for multifactorial anemia - iron deficiency/malabsorption vs undiagnosed blood loss - Feraheme infusion ordered for in a.m. -transition to p.o. iron at discharge; lengthy discussion on need for improved diet   History of polyps diverticulosis -Patient should follow-up with Dr. Leone Payor in the outpatient setting to have colonoscopy and/or EGD for further work-up as she is overdue   Prediabetes A1c 5.3, continue low-carb diet   Anxiety and depression -Continue Zoloft and hydroxyzine nightly   GERD -Continue pharmacy substitution of Protonix   Obesity BMI 34.22 kg/m  DVT prophylaxis: Early ambulation, holding prophylaxis in the setting of profound anemia Code Status: Full Family Communication: None present  Status is: Inpatient  Dispo: The patient is from: Home              Anticipated d/c is to: Home              Anticipated d/c date is: 24 to 48 hours              Patient currently not medically stable for  discharge  Consultants:  None  Procedures:  None  Antimicrobials:  None  Subjective: No acute issues or events overnight  Objective: Vitals:   06/19/22 2154 06/19/22 2209 06/20/22 0012 06/20/22 0818  BP: 129/78 130/77 116/75 134/78  Pulse: 88 92 84 76  Resp: 18 19 19 18   Temp: 98.7 F (37.1 C) 98.7 F (37.1 C) 98.8 F (37.1 C) 98 F (36.7 C)  TempSrc: Oral  Oral Oral  SpO2:   99% 98%  Weight:      Height:        Intake/Output Summary (Last 24 hours) at 06/20/2022 1232 Last data filed at 06/20/2022 0010 Gross per 24 hour  Intake 886 ml  Output --  Net 886 ml   Filed Weights   06/19/22 1426  Weight: 96.2 kg    Examination:  General:  Pleasantly resting in bed, No acute distress. HEENT:  Normocephalic atraumatic.  Sclerae nonicteric, noninjected.  Extraocular movements intact bilaterally. Neck:  Without mass or deformity.  Trachea is midline. Lungs:  Clear to auscultate bilaterally without rhonchi, wheeze, or rales. Heart:  Regular rate and rhythm.  Without murmurs, rubs, or gallops. Abdomen:  Soft, nontender, nondistended.  Without guarding or rebound. Extremities: Without cyanosis, clubbing, edema, or obvious deformity. Vascular:  Dorsalis pedis and posterior tibial pulses palpable bilaterally. Skin:  Warm and dry, no erythema, no ulcerations.   Data Reviewed: I have personally reviewed following labs and imaging studies  CBC: Recent Labs  Lab 06/19/22 1440 06/20/22 0752  WBC  5.2 5.4  HGB 5.3* 6.4*  HCT 21.0* 22.2*  MCV 65.6* 66.7*  PLT 362 318   Basic Metabolic Panel: Recent Labs  Lab 06/19/22 1440 06/20/22 0752  NA 138 138  K 3.6 3.8  CL 107 107  CO2 22 26  GLUCOSE 193* 107*  BUN 10 7*  CREATININE 0.80 0.70  CALCIUM 8.9 8.7*   GFR: Estimated Creatinine Clearance: 85.3 mL/min (by C-G formula based on SCr of 0.7 mg/dL). Liver Function Tests: Recent Labs  Lab 06/19/22 1440  AST 16  ALT 10  ALKPHOS 111  BILITOT 0.5  PROT 6.3*   ALBUMIN 3.2*   No results for input(s): "LIPASE", "AMYLASE" in the last 168 hours. No results for input(s): "AMMONIA" in the last 168 hours. Coagulation Profile: No results for input(s): "INR", "PROTIME" in the last 168 hours. Cardiac Enzymes: No results for input(s): "CKTOTAL", "CKMB", "CKMBINDEX", "TROPONINI" in the last 168 hours. BNP (last 3 results) No results for input(s): "PROBNP" in the last 8760 hours. HbA1C: Recent Labs    06/20/22 0752  HGBA1C 5.3   CBG: No results for input(s): "GLUCAP" in the last 168 hours. Lipid Profile: No results for input(s): "CHOL", "HDL", "LDLCALC", "TRIG", "CHOLHDL", "LDLDIRECT" in the last 72 hours. Thyroid Function Tests: No results for input(s): "TSH", "T4TOTAL", "FREET4", "T3FREE", "THYROIDAB" in the last 72 hours. Anemia Panel: Recent Labs    06/19/22 1736  VITAMINB12 128*  FOLATE 8.0  FERRITIN 3*  TIBC 679*  IRON 25*   Sepsis Labs: No results for input(s): "PROCALCITON", "LATICACIDVEN" in the last 168 hours.  No results found for this or any previous visit (from the past 240 hour(s)).   Radiology Studies: No results found.  Scheduled Meds:  sodium chloride   Intravenous Once   hydrOXYzine  10 mg Oral QHS   pantoprazole  40 mg Oral Daily   rosuvastatin  10 mg Oral QPM   sertraline  100 mg Oral QHS   sodium chloride flush  3 mL Intravenous Q12H   Continuous Infusions:  ferric gluconate (FERRLECIT) IVPB 250 mg (06/20/22 0019)     LOS: 0 days   Time spent:  Azucena Fallen, DO [facility]  If 7PM-7AM, please contact night-coverage [email or web address]  06/20/2022, 12:32 PM

## 2022-06-21 DIAGNOSIS — Z79899 Other long term (current) drug therapy: Secondary | ICD-10-CM | POA: Diagnosis not present

## 2022-06-21 DIAGNOSIS — Z96652 Presence of left artificial knee joint: Secondary | ICD-10-CM | POA: Diagnosis not present

## 2022-06-21 DIAGNOSIS — Z7985 Long-term (current) use of injectable non-insulin antidiabetic drugs: Secondary | ICD-10-CM | POA: Diagnosis not present

## 2022-06-21 DIAGNOSIS — R7303 Prediabetes: Secondary | ICD-10-CM | POA: Diagnosis not present

## 2022-06-21 DIAGNOSIS — D649 Anemia, unspecified: Secondary | ICD-10-CM | POA: Diagnosis not present

## 2022-06-21 LAB — TYPE AND SCREEN
ABO/RH(D): A POS
Antibody Screen: NEGATIVE
Unit division: 0
Unit division: 0
Unit division: 0
Unit division: 0

## 2022-06-21 LAB — BPAM RBC
Blood Product Expiration Date: 202308262359
Blood Product Expiration Date: 202308262359
Blood Product Expiration Date: 202308262359
Blood Product Expiration Date: 202308262359
ISSUE DATE / TIME: 202308051650
ISSUE DATE / TIME: 202308052147
ISSUE DATE / TIME: 202308061221
ISSUE DATE / TIME: 202308061443
Unit Type and Rh: 6200
Unit Type and Rh: 6200
Unit Type and Rh: 6200
Unit Type and Rh: 6200

## 2022-06-21 LAB — CBC
HCT: 29.1 % — ABNORMAL LOW (ref 36.0–46.0)
Hemoglobin: 8.4 g/dL — ABNORMAL LOW (ref 12.0–15.0)
MCH: 20.6 pg — ABNORMAL LOW (ref 26.0–34.0)
MCHC: 28.9 g/dL — ABNORMAL LOW (ref 30.0–36.0)
MCV: 71.3 fL — ABNORMAL LOW (ref 80.0–100.0)
Platelets: 292 10*3/uL (ref 150–400)
RBC: 4.08 MIL/uL (ref 3.87–5.11)
RDW: 22.7 % — ABNORMAL HIGH (ref 11.5–15.5)
WBC: 6.1 10*3/uL (ref 4.0–10.5)
nRBC: 0.3 % — ABNORMAL HIGH (ref 0.0–0.2)

## 2022-06-21 MED ORDER — MULTI-VITAMIN/MINERALS PO TABS: 1.0000 | ORAL_TABLET | Freq: Every day | ORAL | 2 refills | Status: AC

## 2022-06-21 MED ORDER — FERROUS SULFATE 325 (65 FE) MG PO TBEC: 325.0000 mg | DELAYED_RELEASE_TABLET | Freq: Two times a day (BID) | ORAL | 3 refills | Status: DC

## 2022-06-21 NOTE — Progress Notes (Signed)
  Transition of Care [facility]) Screening Note   Patient Details  Name: Dana Williams Date of Birth: 06-18-1961   Transition of Care [facility]) CM/SW Contact:    Cyndi Bender, RN Phone Number: 06/21/2022, 8:03 AM    Transition of Care Department [facility]) has reviewed patient and no TOC needs have been identified at this time. We will continue to monitor patient advancement through interdisciplinary progression rounds. If new patient transition needs arise, please place a TOC consult.

## 2022-06-21 NOTE — Progress Notes (Signed)
Patient discharge teaching given, including activity, diet, follow-up appoints, and medications. Patient verbalized understanding of all discharge instructions. IV access was d/c'd. Vitals are stable. Skin is intact except as charted in most recent assessments. Pt to be escorted out by NT, to be driven home by family. 

## 2022-06-21 NOTE — Discharge Summary (Signed)
Physician Discharge Summary  Dana Williams DGU:[id number] DOB: [birth year] DOA: 06/19/2022  PCP: Aretta Nip, MD  Admit date: 06/19/2022 Discharge date: 06/21/2022  Admitted From: Home Disposition: Home  Recommendations for Outpatient Follow-up:  Follow up with PCP in 1-2 weeks  Home Health: None Equipment/Devices: None  Discharge Condition: Stable CODE STATUS: Full Diet recommendation: As tolerated, regular diet, increase iron intake as discussed  Brief/Interim Summary: Dana Williams is a 61 y.o. female with medical history significant of hyperlipidemia, kidney stones, arthritis, obesity, and GERD who presents after being found to have abnormal hemoglobin - denies overt bleeding; ROS positive for fatigue/weakness and tachycardia.  Patient presented as above with weakness found to be profoundly anemic secondary to iron deficiency.  Patient received IV iron, 4 unit PRBC, IV fluids and supportive care with marked improvement in symptoms.  Guaiac testing negative, no signs or symptoms of blood loss.  Recommend close follow-up with PCP in the next 1 to 2 weeks for repeat evaluation medication management and labs as indicated.  Patient was discharged as below on iron and multivitamin in addition to her routine home medications.  She is otherwise stable and agreeable for discharge home.  Discharge Diagnoses:  Principal Problem:   Symptomatic anemia Active Problems:   History of colon polyps   History of diverticulosis   Prediabetes   Anxiety and depression   History of gastroesophageal reflux (GERD)   Obesity (BMI 30-39.9)    Discharge Instructions   Allergies as of 06/21/2022       Reactions   Sulfa Antibiotics Other (See Comments)   Mouth ulcers        Medication List     TAKE these medications    ammonium lactate 12 % lotion Commonly known as: LAC-HYDRIN Apply on the skin twice a day   Azelaic Acid 15 % gel Commonly known as: Finacea Apply to affected area  daily   calcipotriene-betamethasone external suspension Commonly known as: Taclonex Apply to affected area daily What changed:  how much to take when to take this reasons to take this   esomeprazole 40 MG capsule Commonly known as: NEXIUM Take 1 capsule by mouth once a day   ferrous sulfate 325 (65 FE) MG EC tablet Take 1 tablet (325 mg total) by mouth 2 (two) times daily.   hydrOXYzine 10 MG tablet Commonly known as: ATARAX Take 2 tablets by mouth at bedtime What changed:  how much to take how to take this when to take this   multivitamin with minerals tablet Take 1 tablet by mouth daily.   rosuvastatin 10 MG tablet Commonly known as: Crestor Take 1 tablet by mouth once daily   sertraline 100 MG tablet Commonly known as: Zoloft Take 1 tablet by mouth once daily What changed: Another medication with the same name was removed. Continue taking this medication, and follow the directions you see here.        Allergies  Allergen Reactions   Sulfa Antibiotics Other (See Comments)    Mouth ulcers    Consultations: None   Procedures/Studies: No results found.   Subjective: No acute issues or events overnight   Discharge Exam: Vitals:   06/21/22 0353 06/21/22 0413  BP: 105/60 105/60  Pulse: 70   Resp: 16   Temp: 98.2 F (36.8 C) 97.7 F (36.5 C)  SpO2: 96% 97%   Vitals:   06/20/22 2017 06/21/22 0024 06/21/22 0353 06/21/22 0413  BP: (!) 146/80 123/73 105/60 105/60  Pulse: 83 83  70   Resp: '16 16 16   '$ Temp: 97.7 F (36.5 C) 98 F (36.7 C) 98.2 F (36.8 C) 97.7 F (36.5 C)  TempSrc: Oral Oral Oral Oral  SpO2: 98% 98% 96% 97%  Weight:      Height:        General: Pt is alert, awake, not in acute distress Cardiovascular: RRR, S1/S2 +, no rubs, no gallops Respiratory: CTA bilaterally, no wheezing, no rhonchi Abdominal: Soft, NT, ND, bowel sounds + Extremities: no edema, no cyanosis    The results of significant diagnostics from this  hospitalization (including imaging, microbiology, ancillary and laboratory) are listed below for reference.     Microbiology: No results found for this or any previous visit (from the past 240 hour(s)).   Labs: BNP (last 3 results) No results for input(s): "BNP" in the last 8760 hours. Basic Metabolic Panel: Recent Labs  Lab 06/19/22 1440 06/20/22 0752  NA 138 138  K 3.6 3.8  CL 107 107  CO2 22 26  GLUCOSE 193* 107*  BUN 10 7*  CREATININE 0.80 0.70  CALCIUM 8.9 8.7*   Liver Function Tests: Recent Labs  Lab 06/19/22 1440  AST 16  ALT 10  ALKPHOS 111  BILITOT 0.5  PROT 6.3*  ALBUMIN 3.2*   No results for input(s): "LIPASE", "AMYLASE" in the last 168 hours. No results for input(s): "AMMONIA" in the last 168 hours. CBC: Recent Labs  Lab 06/19/22 1440 06/20/22 0752 06/20/22 1807 06/21/22 0819  WBC 5.2 5.4  --  6.1  HGB 5.3* 6.4* 8.9* 8.4*  HCT 21.0* 22.2* 30.3* 29.1*  MCV 65.6* 66.7*  --  71.3*  PLT 362 318  --  292   Cardiac Enzymes: No results for input(s): "CKTOTAL", "CKMB", "CKMBINDEX", "TROPONINI" in the last 168 hours. BNP: Invalid input(s): "POCBNP" CBG: No results for input(s): "GLUCAP" in the last 168 hours. D-Dimer No results for input(s): "DDIMER" in the last 72 hours. Hgb A1c Recent Labs    06/20/22 0752  HGBA1C 5.3   Lipid Profile No results for input(s): "CHOL", "HDL", "LDLCALC", "TRIG", "CHOLHDL", "LDLDIRECT" in the last 72 hours. Thyroid function studies No results for input(s): "TSH", "T4TOTAL", "T3FREE", "THYROIDAB" in the last 72 hours.  Invalid input(s): "FREET3" Anemia work up Recent Labs    06/19/22 1736  VITAMINB12 128*  FOLATE 8.0  FERRITIN 3*  TIBC 679*  IRON 25*   Urinalysis    Component Value Date/Time   COLORURINE YELLOW 04/21/2015 0623   APPEARANCEUR CLEAR 04/21/2015 0623   LABSPEC 1.023 04/21/2015 0623   PHURINE 6.0 04/21/2015 0623   GLUCOSEU NEGATIVE 04/21/2015 0623   HGBUR MODERATE (A) 04/21/2015 0623    BILIRUBINUR NEGATIVE 04/21/2015 0623   KETONESUR NEGATIVE 04/21/2015 0623   PROTEINUR 30 (A) 04/21/2015 0623   UROBILINOGEN 0.2 04/21/2015 0623   NITRITE NEGATIVE 04/21/2015 0623   LEUKOCYTESUR NEGATIVE 04/21/2015 0623   Sepsis Labs Recent Labs  Lab 06/19/22 1440 06/20/22 0752 06/21/22 0819  WBC 5.2 5.4 6.1   Microbiology No results found for this or any previous visit (from the past 240 hour(s)).   Time coordinating discharge: Over 30 minutes  SIGNED:   Little Ishikawa, DO [facility] 06/21/2022, 4:42 PM Pager   If 7PM-7AM, please contact night-coverage [email or web address]

## 2022-06-23 ENCOUNTER — Encounter (INDEPENDENT_AMBULATORY_CARE_PROVIDER_SITE_OTHER): Payer: Self-pay

## 2022-06-24 ENCOUNTER — Other Ambulatory Visit (HOSPITAL_COMMUNITY): Payer: Self-pay

## 2022-06-24 MED ORDER — SERTRALINE HCL 100 MG PO TABS
ORAL_TABLET | ORAL | 0 refills | Status: DC
  Filled 2022-06-24: qty 90, 90d supply, fill #0

## 2022-06-24 MED ORDER — HYDROXYZINE HCL 10 MG PO TABS
ORAL_TABLET | ORAL | 0 refills | Status: DC
  Filled 2022-06-24: qty 180, 90d supply, fill #0

## 2022-07-13 ENCOUNTER — Other Ambulatory Visit (HOSPITAL_COMMUNITY): Payer: Self-pay

## 2022-07-13 DIAGNOSIS — E559 Vitamin D deficiency, unspecified: Secondary | ICD-10-CM | POA: Diagnosis not present

## 2022-07-13 DIAGNOSIS — D508 Other iron deficiency anemias: Secondary | ICD-10-CM | POA: Diagnosis not present

## 2022-07-13 DIAGNOSIS — R000 Tachycardia, unspecified: Secondary | ICD-10-CM | POA: Diagnosis not present

## 2022-07-13 DIAGNOSIS — R5383 Other fatigue: Secondary | ICD-10-CM | POA: Diagnosis not present

## 2022-07-13 DIAGNOSIS — R0602 Shortness of breath: Secondary | ICD-10-CM | POA: Diagnosis not present

## 2022-07-13 MED ORDER — ERGOCALCIFEROL 1.25 MG (50000 UT) PO CAPS
ORAL_CAPSULE | ORAL | 0 refills | Status: DC
  Filled 2022-07-13: qty 8, 56d supply, fill #0

## 2022-07-15 ENCOUNTER — Telehealth: Payer: Self-pay | Admitting: Oncology

## 2022-07-15 NOTE — Telephone Encounter (Signed)
Scheduled appt per 8/30 referral. Pt is aware of appt date and time. Pt is aware to arrive 15 mins prior to appt time and to bring and updated insurance card. Pt is aware of appt location.   

## 2022-07-23 ENCOUNTER — Other Ambulatory Visit (HOSPITAL_COMMUNITY): Payer: Self-pay

## 2022-07-23 MED ORDER — "SYRINGE/NEEDLE (DISP) 18G X 1-1/2"" 3 ML MISC"
1 refills | Status: DC
  Filled 2022-07-23: qty 5, 30d supply, fill #0
  Filled 2022-07-23: qty 5, 35d supply, fill #0
  Filled 2022-09-15: qty 5, 35d supply, fill #1

## 2022-07-23 MED ORDER — "BD DISP NEEDLE 23G X 1"" MISC"
1 refills | Status: DC
  Filled 2022-07-23: qty 5, 30d supply, fill #0
  Filled 2022-09-15: qty 5, 30d supply, fill #1

## 2022-08-06 ENCOUNTER — Inpatient Hospital Stay: Payer: 59 | Attending: Oncology | Admitting: Oncology

## 2022-08-06 ENCOUNTER — Other Ambulatory Visit: Payer: Self-pay

## 2022-08-06 VITALS — BP 143/83 | HR 119 | Temp 97.6°F | Resp 17 | Ht 66.0 in | Wt 213.4 lb

## 2022-08-06 DIAGNOSIS — R0602 Shortness of breath: Secondary | ICD-10-CM | POA: Insufficient documentation

## 2022-08-06 DIAGNOSIS — E785 Hyperlipidemia, unspecified: Secondary | ICD-10-CM | POA: Insufficient documentation

## 2022-08-06 DIAGNOSIS — D509 Iron deficiency anemia, unspecified: Secondary | ICD-10-CM | POA: Diagnosis not present

## 2022-08-06 DIAGNOSIS — Z79899 Other long term (current) drug therapy: Secondary | ICD-10-CM | POA: Diagnosis not present

## 2022-08-06 DIAGNOSIS — E669 Obesity, unspecified: Secondary | ICD-10-CM | POA: Insufficient documentation

## 2022-08-06 DIAGNOSIS — I10 Essential (primary) hypertension: Secondary | ICD-10-CM | POA: Insufficient documentation

## 2022-08-06 NOTE — Progress Notes (Signed)
Reason for the request:    Iron deficiency  HPI: I was asked by Faustino Congress, NP to evaluate Ms. Funderburke for the evaluation of iron deficiency.  She is a 61 year old woman with history of hypertension, hyperlipidemia and obesity we will presented to the emergency department on August 5 with symptoms of shortness of breath and found to have hemoglobin of 5.3 with MCV of 65.6 and RDW of 19.  Iron studies at that time showed iron level of 25, saturation of 4% and ferritin of 3.  She received packed red cell transfusion as well as 250 mg of Ferrlecit.  Repeat labs obtained at her primary care provider showed a hemoglobin of 11.3, MCV 74.6 with iron level of 106.  Her ferritin was 45.7.  Clinically, she reports no complaints at this time.  She denies any nausea abdominal pain or dyspepsia.  She denies any hematochezia, melena, hemoptysis or emesis or hematuria.  She has resumed all activities of daily living without any excessive fatigue or tiredness.   She does not report any headaches, blurry vision, syncope or seizures. Does not report any fevers, chills or sweats.  Does not report any cough, wheezing or hemoptysis.  Does not report any chest pain, palpitation, orthopnea or leg edema.  Does not report any nausea, vomiting or abdominal pain.  Does not report any constipation or diarrhea.  Does not report any skeletal complaints.    Does not report frequency, urgency or hematuria.  Does not report any skin rashes or lesions. Does not report any heat or cold intolerance.  Does not report any lymphadenopathy or petechiae.  Does not report any anxiety or depression.  Remaining review of systems is negative.     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   History of hiatal  hernia    History of kidney stones    Hyperlipidemia    Joint pain    Kidney stones    Obesity    Personal history of colonic polyp - adenoma 01/29/2014   Plantar fasciitis of left foot    PONV (postoperative nausea and vomiting)    Postmenopausal    Psoriasis    Renal calculus, left    Sigmoid diverticulosis    Urticaria    Vitamin D deficiency   :   Past Surgical History:  Procedure Laterality Date   CLOSED MANIPULATION SHOULDER     COLONOSCOPY WITH PROPOFOL  01-25-2014   CYSTO/  BILATERAL RETROGRADE PYELOGRAM/  BILATERAL URETEROSCOPIC LASER LITHOTRIPSY STONE EXTRACTIONS/  BILATERAL STENT PLACEMENT  02-13-2010   CYSTOSCOPY WITH RETROGRADE PYELOGRAM, URETEROSCOPY AND STENT PLACEMENT Left 04/21/2015   Procedure: CYSTOSCOPY WITH RETROGRADE PYELOGRAM, URETEROSCOPY, STONE EXTRACTION AND STENT PLACEMENT;  Surgeon: Franchot Gallo, MD;  Location: [facility];  Service: Urology;  Laterality: Left;   CYSTOURETHROSCOPY/  PLACEMENT LYNX SUPRAPUBIC SLING  05-24-2008   HAND TENDON SURGERY Right 1993   HOLMIUM LASER APPLICATION Left 9/0/2409   Procedure: HOLMIUM LASER APPLICATION;  Surgeon: Franchot Gallo, MD;  Location: [facility];  Service: Urology;  Laterality: Left;   LAPAROSCOPIC CHOLECYSTECTOMY  01-31-2002   LEFT URETEROSCOPIC STONE EXTRACTION/  STENT PLACEMENT  10-06-2005   RHINOPLASTY  2007   RHINOPLASTY     STRABISMUS SURGERY Left age 43   TOTAL KNEE ARTHROPLASTY Left 10/27/2016   Procedure: TOTAL KNEE ARTHROPLASTY;  Surgeon: [name], MD;  Location: [facility];  Service: Orthopedics;  Laterality: Left;   WISDOM TOOTH EXTRACTION  age 22  :   Current Outpatient Medications:    ammonium lactate (LAC-HYDRIN) 12 % lotion, Apply on the skin twice a day, Disp: 226 g, Rfl: 2   Azelaic Acid (FINACEA) 15 % gel, Apply to affected area daily, Disp: 50 g, Rfl: 1   calcipotriene-betamethasone (TACLONEX) external suspension, Apply to affected area daily  (Patient taking differently: Apply 1 application  topically daily as needed (psoriasis).), Disp: 60 g, Rfl: 2   ergocalciferol (VITAMIN D2) 1.25 MG (50000 UT) capsule, Take 1 capsule by mouth once a week, Disp: 8 capsule, Rfl: 0   esomeprazole (NEXIUM) 40 MG capsule, Take 1 capsule by mouth once a day, Disp: 90 capsule, Rfl: 1   ferrous sulfate 325 (65 FE) MG EC tablet, Take 1 tablet (325 mg total) by mouth 2 (two) times daily., Disp: 60 tablet, Rfl: 3   hydrOXYzine (ATARAX) 10 MG tablet, Take 2 tablets by mouth at bedtime (Patient taking differently: Take 10 mg by mouth at bedtime.), Disp: 90 tablet, Rfl: 1   hydrOXYzine (ATARAX) 10 MG tablet, Take 2 tablets by mouth at bedtime, Disp: 180 tablet, Rfl: 0   Multiple Vitamins-Minerals (MULTIVITAMIN WITH MINERALS) tablet, Take 1 tablet by mouth daily., Disp: 30 tablet, Rfl: 2   SYRINGE-NEEDLE, DISP, 3 ML 18G X 1-1/2" 3 ML MISC, to draw up cyanocobalamin to inject  once a week, Disp: 5 each, Rfl: 1   NEEDLE, DISP, 23 G (BD DISP NEEDLE) 23G X 1" MISC, Use to adminster the B-12 into the muscle once a week, Disp: 5 each, Rfl: 1   rosuvastatin (CRESTOR) 10 MG tablet, Take 1 tablet by mouth once daily, Disp: 90 tablet, Rfl: 1   sertraline (ZOLOFT) 100 MG tablet, Take 1 tablet by mouth once daily, Disp: 90 tablet, Rfl: 1   sertraline (ZOLOFT) 100 MG tablet, Take 1 tablet by mouth once a day, Disp: 90 tablet, Rfl: 0:   Allergies  Allergen Reactions   Sulfa Antibiotics Other (See Comments)    Mouth ulcers  :   Family History  Problem Relation Age of Onset   Stomach cancer Paternal Grandmother    Hypertension Mother    Hyperlipidemia Mother    Thyroid disease Mother    Obesity Mother    Colon cancer Neg Hx    Esophageal cancer Neg Hx    Rectal cancer Neg Hx    Allergic rhinitis Neg Hx    Asthma Neg Hx    Eczema Neg Hx    Urticaria Neg Hx   :   Social History   Socioeconomic History   Marital status: Divorced    Spouse name: Not on file    Number of children: Not on file   Years of education: Not on file   Highest education level: Not on file  Occupational History   Occupation: RN  Tobacco Use   Smoking status: Never   Smokeless tobacco: Never  Vaping Use   Vaping Use: Never used  Substance and Sexual Activity   Alcohol use: No   Drug use: No   Sexual activity: Not on file  Other Topics Concern   Not on file  Social History Narrative   Not on file   Social  Determinants of Health   Financial Resource Strain: Not on file  Food Insecurity: Not on file  Transportation Needs: Not on file  Physical Activity: Not on file  Stress: Not on file  Social Connections: Not on file  Intimate Partner Violence: Not on file  :  Pertinent items are noted in HPI.  Exam: Blood pressure (!) 143/83, pulse (!) 119, temperature 97.6 F (36.4 C), temperature source Temporal, resp. rate 17, height '5\' 6"'$  (1.676 m), weight 213 lb 6.4 oz (96.8 kg), SpO2 98 %. ECOG 0 General appearance: alert and cooperative appeared without distress. Head: atraumatic without any abnormalities. Eyes: conjunctivae/corneas clear. PERRL.  Sclera anicteric. Throat: lips, mucosa, and tongue normal; without oral thrush or ulcers. Resp: clear to auscultation bilaterally without rhonchi, wheezes or dullness to percussion. Cardio: regular rate and rhythm, S1, S2 normal, no murmur, click, rub or gallop GI: soft, non-tender; bowel sounds normal; no masses,  no organomegaly Skin: Skin color, texture, turgor normal. No rashes or lesions Lymph nodes: Cervical, supraclavicular, and axillary nodes normal. Neurologic: Grossly normal without any motor, sensory or deep tendon reflexes. Musculoskeletal: No joint deformity or effusion.   Assessment and Plan:   61 year old with:  1.  Iron deficiency anemia in a postmenopausal woman without signs of symptoms of acute GI bleeding.  She presented with a hemoglobin of 5.3, iron of 25 and ferritin of 3.  After  transfusion and IV iron replacement as well as oral iron therapy iron studies is normalized with hemoglobin above 11.  The differential diagnosis of these findings were discussed at this time.  Her last colonoscopy was in 2015 and appears to be due.  I recommend GI work-up and evaluation by Dr. Arelia Longest.  Colonoscopy, endoscopy and possible capsule endoscopy could be indicated.  Other possibilities would be absorption issues such as protein-losing enteropathy although this is considered less likely at this time.  I see no evidence to suggest a hematological disorder.  Abdominal malignancy is also considered less likely at this time.  From a management standpoint, I recommended continued oral iron replacement and I will recheck her counts in 3 months.  IV iron infusion could be needed in the future if she develops any further deficiency.  2.  Follow-up: Will be in 3 months for a follow-up.  45  minutes were dedicated to this visit. The time was spent on reviewing laboratory data, discussing treatment options, discussing differential diagnosis and answering questions regarding future plan.   A copy of this consult has been forwarded to the requesting physician.

## 2022-08-18 DIAGNOSIS — M25512 Pain in left shoulder: Secondary | ICD-10-CM | POA: Diagnosis not present

## 2022-08-18 DIAGNOSIS — M79641 Pain in right hand: Secondary | ICD-10-CM | POA: Diagnosis not present

## 2022-08-19 ENCOUNTER — Other Ambulatory Visit (HOSPITAL_COMMUNITY): Payer: Self-pay

## 2022-08-19 MED ORDER — CELECOXIB 200 MG PO CAPS
200.0000 mg | ORAL_CAPSULE | Freq: Every day | ORAL | 3 refills | Status: DC
  Filled 2022-08-19: qty 30, 30d supply, fill #0
  Filled 2022-09-15: qty 30, 30d supply, fill #1
  Filled 2022-11-21: qty 30, 30d supply, fill #2

## 2022-08-20 ENCOUNTER — Other Ambulatory Visit (HOSPITAL_COMMUNITY): Payer: Self-pay

## 2022-08-24 ENCOUNTER — Telehealth: Payer: Self-pay | Admitting: Internal Medicine

## 2022-08-24 ENCOUNTER — Other Ambulatory Visit (HOSPITAL_COMMUNITY): Payer: Self-pay

## 2022-08-24 DIAGNOSIS — D508 Other iron deficiency anemias: Secondary | ICD-10-CM

## 2022-08-24 MED ORDER — CYANOCOBALAMIN 1000 MCG/ML IJ SOLN
1000.0000 ug | INTRAMUSCULAR | 1 refills | Status: DC
  Filled 2022-08-24: qty 3, 90d supply, fill #0
  Filled 2022-11-21: qty 3, 90d supply, fill #1

## 2022-08-24 NOTE — Telephone Encounter (Signed)
Sent me an email about obtaining appointment earlier

## 2022-08-27 NOTE — Telephone Encounter (Signed)
Dana Williams (former [employer]) has iron deficiency anemia and needs an EGD and colonoscopy set up - will need a previsit vs setting up over phone (she is familiar w/ process so phone may be ok)   I also want her to do celiac labs (I ordered these) at her convenience.  I have emailed her about this and she will expect call - I told her could be next week

## 2022-08-27 NOTE — Telephone Encounter (Signed)
Spoke with Dana Williams and we have set her up for an [place] on 10/01/2022 at 3:00pm for her IDA. She will come by next week to get instructions. She is taking her iron once daily. She said she is SOB with walking. I told her I would ask if you want another CBC/diff with the blood work you already ordered. The last one was done in August.

## 2022-08-28 ENCOUNTER — Other Ambulatory Visit: Payer: Self-pay | Admitting: Internal Medicine

## 2022-08-28 DIAGNOSIS — D508 Other iron deficiency anemias: Secondary | ICD-10-CM

## 2022-08-28 NOTE — Telephone Encounter (Signed)
I ordered a CBC.

## 2022-08-30 NOTE — Telephone Encounter (Signed)
Kerissa informed that CBC has been ordered. She said thank you.

## 2022-09-02 ENCOUNTER — Other Ambulatory Visit (INDEPENDENT_AMBULATORY_CARE_PROVIDER_SITE_OTHER): Payer: 59

## 2022-09-02 DIAGNOSIS — D508 Other iron deficiency anemias: Secondary | ICD-10-CM | POA: Diagnosis not present

## 2022-09-02 LAB — CBC
HCT: 31 % — ABNORMAL LOW (ref 36.0–46.0)
Hemoglobin: 10.1 g/dL — ABNORMAL LOW (ref 12.0–15.0)
MCHC: 32.6 g/dL (ref 30.0–36.0)
MCV: 81.8 fl (ref 78.0–100.0)
Platelets: 343 10*3/uL (ref 150.0–400.0)
RBC: 3.79 Mil/uL — ABNORMAL LOW (ref 3.87–5.11)
RDW: 16.5 % — ABNORMAL HIGH (ref 11.5–15.5)
WBC: 9 10*3/uL (ref 4.0–10.5)

## 2022-09-03 LAB — TISSUE TRANSGLUTAMINASE, IGA: (tTG) Ab, IgA: <1 U/mL

## 2022-09-03 LAB — IGA: Immunoglobulin A: 103 mg/dL (ref 70–320)

## 2022-09-10 DIAGNOSIS — Z6837 Body mass index (BMI) 37.0-37.9, adult: Secondary | ICD-10-CM | POA: Diagnosis not present

## 2022-09-10 DIAGNOSIS — D508 Other iron deficiency anemias: Secondary | ICD-10-CM | POA: Diagnosis not present

## 2022-09-10 DIAGNOSIS — R5383 Other fatigue: Secondary | ICD-10-CM | POA: Diagnosis not present

## 2022-09-15 ENCOUNTER — Other Ambulatory Visit (HOSPITAL_COMMUNITY): Payer: Self-pay

## 2022-09-15 MED ORDER — SERTRALINE HCL 100 MG PO TABS
100.0000 mg | ORAL_TABLET | Freq: Every day | ORAL | 1 refills | Status: DC
  Filled 2022-09-15: qty 90, 90d supply, fill #0
  Filled 2022-12-08: qty 90, 90d supply, fill #1

## 2022-09-16 ENCOUNTER — Other Ambulatory Visit (HOSPITAL_COMMUNITY): Payer: Self-pay

## 2022-09-24 ENCOUNTER — Encounter: Payer: Self-pay | Admitting: Internal Medicine

## 2022-10-01 ENCOUNTER — Encounter: Payer: 59 | Admitting: Internal Medicine

## 2022-10-01 ENCOUNTER — Telehealth: Payer: Self-pay | Admitting: Internal Medicine

## 2022-10-01 NOTE — Addendum Note (Signed)
Addended by: [place], Jabri Blancett E on: 10/01/2022 11:39 AM   Modules accepted: Orders

## 2022-10-01 NOTE — Telephone Encounter (Signed)
Good morning Dr. Carlean Purl, patient called this morning to reschedule her procedure because she says no one went over her prep instructions with her and she did not realize they were on her MyChart as she does not use it.

## 2022-10-01 NOTE — Telephone Encounter (Signed)
OK No charge 

## 2022-10-01 NOTE — Telephone Encounter (Signed)
I have redone the instructions and will mail them to her.  I have left her a message about this and also for her to contact us about cancelling her office visit Dec.14th appointment since the procedures are already set up.

## 2022-10-01 NOTE — Telephone Encounter (Signed)
Patient called this morning to reschedule for her procedure. She states no one went over the instructions with her so she was unaware how to prep. States they were put on her MyChart but she doesn't use. I have rescheduled her for 12/21, she is requesting updated prep instructions. Please advise, thank you!

## 2022-10-12 NOTE — Telephone Encounter (Signed)
Dana Williams contacted me and we have cancelled her December 14th appointment.

## 2022-10-21 ENCOUNTER — Other Ambulatory Visit: Payer: Self-pay | Admitting: Internal Medicine

## 2022-10-21 DIAGNOSIS — D508 Other iron deficiency anemias: Secondary | ICD-10-CM

## 2022-10-22 ENCOUNTER — Encounter (HOSPITAL_COMMUNITY)
Admission: RE | Admit: 2022-10-22 | Discharge: 2022-10-22 | Disposition: A | Discharge status: Home / Self Care | Payer: 59 | Source: Ambulatory Visit | Attending: Internal Medicine | Admitting: Internal Medicine

## 2022-10-22 DIAGNOSIS — D649 Anemia, unspecified: Secondary | ICD-10-CM | POA: Insufficient documentation

## 2022-10-22 DIAGNOSIS — Z01812 Encounter for preprocedural laboratory examination: Secondary | ICD-10-CM | POA: Insufficient documentation

## 2022-10-22 LAB — CBC
HCT: 31.6 % — ABNORMAL LOW (ref 36.0–46.0)
Hemoglobin: 9 g/dL — ABNORMAL LOW (ref 12.0–15.0)
MCH: 23.5 pg — ABNORMAL LOW (ref 26.0–34.0)
MCHC: 28.5 g/dL — ABNORMAL LOW (ref 30.0–36.0)
MCV: 82.5 fL (ref 80.0–100.0)
Platelets: 334 10*3/uL (ref 150–400)
RBC: 3.83 MIL/uL — ABNORMAL LOW (ref 3.87–5.11)
RDW: 14.6 % (ref 11.5–15.5)
WBC: 6.6 10*3/uL (ref 4.0–10.5)
nRBC: 0 % (ref 0.0–0.2)

## 2022-10-28 ENCOUNTER — Ambulatory Visit: Payer: 59 | Admitting: Internal Medicine

## 2022-10-28 ENCOUNTER — Encounter: Payer: Self-pay | Admitting: Internal Medicine

## 2022-11-02 ENCOUNTER — Other Ambulatory Visit: Payer: 59

## 2022-11-02 ENCOUNTER — Ambulatory Visit: Payer: 59 | Admitting: Oncology

## 2022-11-04 ENCOUNTER — Ambulatory Visit (AMBULATORY_SURGERY_CENTER): Payer: 59 | Admitting: Internal Medicine

## 2022-11-04 ENCOUNTER — Encounter: Payer: Self-pay | Admitting: Internal Medicine

## 2022-11-04 ENCOUNTER — Other Ambulatory Visit: Payer: Self-pay | Admitting: Oncology

## 2022-11-04 VITALS — BP 127/79 | HR 98 | Temp 99.5°F | Resp 16 | Ht 63.5 in | Wt 237.6 lb

## 2022-11-04 DIAGNOSIS — K257 Chronic gastric ulcer without hemorrhage or perforation: Secondary | ICD-10-CM

## 2022-11-04 DIAGNOSIS — D125 Benign neoplasm of sigmoid colon: Secondary | ICD-10-CM | POA: Diagnosis not present

## 2022-11-04 DIAGNOSIS — K259 Gastric ulcer, unspecified as acute or chronic, without hemorrhage or perforation: Secondary | ICD-10-CM

## 2022-11-04 DIAGNOSIS — K449 Diaphragmatic hernia without obstruction or gangrene: Secondary | ICD-10-CM | POA: Insufficient documentation

## 2022-11-04 DIAGNOSIS — D509 Iron deficiency anemia, unspecified: Secondary | ICD-10-CM

## 2022-11-04 DIAGNOSIS — D508 Other iron deficiency anemias: Secondary | ICD-10-CM

## 2022-11-04 DIAGNOSIS — K2950 Unspecified chronic gastritis without bleeding: Secondary | ICD-10-CM | POA: Diagnosis not present

## 2022-11-04 MED ORDER — SODIUM CHLORIDE 0.9 % IV SOLN: 500.0000 mL | INTRAVENOUS | Status: DC

## 2022-11-04 NOTE — Progress Notes (Signed)
Report given to PACU, vss 

## 2022-11-04 NOTE — Progress Notes (Signed)
Called to room to assist during endoscopic procedure.  Patient ID and intended procedure confirmed with present staff. Received instructions for my participation in the procedure from the performing physician.  

## 2022-11-04 NOTE — Progress Notes (Signed)
Pt's states no medical or surgical changes since previsit or office visit. 

## 2022-11-04 NOTE — Op Note (Signed)
[facility] Patient Name: Dana Williams Procedure Date: 11/04/2022 3:37 PM MRN: [id number] Endoscopist: Gatha Mayer , MD, [id number] Age: 61 Referring MD:  Date of Birth: 03/23/1961 Gender: Female Account #: [id number] Procedure:                Colonoscopy Indications:              Iron deficiency anemia Medicines:                Monitored Anesthesia Care Procedure:                Pre-Anesthesia Assessment:                           - Prior to the procedure, a History and Physical                            was performed, and patient medications and                            allergies were reviewed. The patient's tolerance of                            previous anesthesia was also reviewed. The risks                            and benefits of the procedure and the sedation                            options and risks were discussed with the patient.                            All questions were answered, and informed consent                            was obtained. Prior Anticoagulants: The patient has                            taken no anticoagulant or antiplatelet agents. ASA                            Grade Assessment: III - A patient with severe                            systemic disease. After reviewing the risks and                            benefits, the patient was deemed in satisfactory                            condition to undergo the procedure.                           After obtaining informed consent, the colonoscope  was passed under direct vision. Throughout the                            procedure, the patient's blood pressure, pulse, and                            oxygen saturations were monitored continuously. The                            Olympus CF-HQ190L [id number]) Colonoscope was                            introduced through the anus and advanced to the the                            cecum, identified by appendiceal  orifice and                            ileocecal valve. The colonoscopy was somewhat                            difficult due to significant looping. Successful                            completion of the procedure was aided by applying                            abdominal pressure. The patient tolerated the                            procedure well. The quality of the bowel                            preparation was good. The ileocecal valve,                            appendiceal orifice, and rectum were photographed.                            The bowel preparation used was Miralax via split                            dose instruction. Scope In: 3:55:33 PM Scope Out: 4:11:39 PM Scope Withdrawal Time: 0 hours 12 minutes 45 seconds  Total Procedure Duration: 0 hours 16 minutes 6 seconds  Findings:                 The perianal and digital rectal examinations were                            normal.                           A 5 mm polyp was found in the sigmoid colon. The  polyp was sessile. The polyp was removed with a                            cold snare. Resection and retrieval were complete.                            Verification of patient identification for the                            specimen was done. Estimated blood loss was minimal.                           Multiple diverticula were found in the sigmoid                            colon.                           The exam was otherwise without abnormality on                            direct and retroflexion views. Complications:            No immediate complications. Estimated Blood Loss:     Estimated blood loss was minimal. Impression:               - One 5 mm polyp in the sigmoid colon, removed with                            a cold snare. Resected and retrieved.                           - Diverticulosis in the sigmoid colon.                           - The examination was otherwise normal  on direct                            and retroflexion views.                           - Personal history of colonic polyp - 2 mm adenoma                            removed 2015. Recommendation:           - Patient has a contact number available for                            emergencies. The signs and symptoms of potential                            delayed complications were discussed with the                            patient. Return to  normal activities tomorrow.                            Written discharge instructions were provided to the                            patient.                           - Resume previous diet.                           - Continue present medications. See EGD note also                           - Repeat colonoscopy is recommended. The                            colonoscopy date will be determined after pathology                            results from today's exam become available for                            review. Gatha Mayer, MD 11/04/2022 4:27:56 PM This report has been signed electronically.

## 2022-11-04 NOTE — Progress Notes (Signed)
[facility] History and Physical   Primary Care Physician:  Faustino Congress, NP   Reason for Procedure:   Iron deficiency anemia  Plan:    Colonoscopy and EGD     HPI: KYLEN ISMAEL is a 61 y.o. female here for evaluation of iron deficiency anemia and B12 deficiency   Past Medical History:  Diagnosis Date   Anemia    Arthritis    Arthritis    Arthritis of pelvic region    Depression    Dry skin    Erythema multiforme    Fatigue    GERD (gastroesophageal reflux disease)    H/O psoriatic arthritis    DX  age 69---- in remission for 20 years   History of hiatal hernia    History of kidney stones    Hyperlipidemia    Joint pain    Kidney stones    Obesity    Personal history of colonic polyp - adenoma 01/29/2014   Plantar fasciitis of left foot    PONV (postoperative nausea and vomiting)    Postmenopausal    Psoriasis    Renal calculus, left    Sigmoid diverticulosis    Urticaria    Vitamin D deficiency     Past Surgical History:  Procedure Laterality Date   CLOSED MANIPULATION SHOULDER     COLONOSCOPY WITH PROPOFOL  01-25-2014   CYSTO/  BILATERAL RETROGRADE PYELOGRAM/  BILATERAL URETEROSCOPIC LASER LITHOTRIPSY STONE EXTRACTIONS/  BILATERAL STENT PLACEMENT  02-13-2010   CYSTOSCOPY WITH RETROGRADE PYELOGRAM, URETEROSCOPY AND STENT PLACEMENT Left 04/21/2015   Procedure: CYSTOSCOPY WITH RETROGRADE PYELOGRAM, URETEROSCOPY, STONE EXTRACTION AND STENT PLACEMENT;  Surgeon: Franchot Gallo, MD;  Location: [facility];  Service: Urology;  Laterality: Left;   CYSTOURETHROSCOPY/  PLACEMENT LYNX SUPRAPUBIC SLING  05-24-2008   HAND TENDON SURGERY Right 1993   HOLMIUM LASER APPLICATION Left 12/20/8525   Procedure: HOLMIUM LASER APPLICATION;  Surgeon: Franchot Gallo, MD;  Location: [facility];  Service: Urology;  Laterality: Left;   LAPAROSCOPIC CHOLECYSTECTOMY  01-31-2002   LEFT URETEROSCOPIC STONE EXTRACTION/  STENT PLACEMENT   10-06-2005   RHINOPLASTY  2007   RHINOPLASTY     STRABISMUS SURGERY Left age 65   TOTAL KNEE ARTHROPLASTY Left 10/27/2016   Procedure: TOTAL KNEE ARTHROPLASTY;  Surgeon: [name], MD;  Location: [place];  Service: Orthopedics;  Laterality: Left;   WISDOM TOOTH EXTRACTION  age 2    Prior to Admission medications   Medication Sig Start Date End Date Taking? Authorizing Provider  ammonium lactate (LAC-HYDRIN) 12 % lotion Apply on the skin twice a day 10/05/21  Yes   Azelaic Acid (FINACEA) 15 % gel Apply to affected area daily 10/05/21  Yes   calcipotriene-betamethasone (TACLONEX) external suspension Apply to affected area daily Patient taking differently: Apply 1 application  topically daily as needed (psoriasis). 10/05/21  Yes   ergocalciferol (VITAMIN D2) 1.25 MG (50000 UT) capsule Take 1 capsule by mouth once a week 07/13/22  Yes   esomeprazole (NEXIUM) 40 MG capsule Take 1 capsule by mouth once a day 02/15/22  Yes   ferrous sulfate 325 (65 FE) MG EC tablet Take 1 tablet (325 mg total) by mouth 2 (two) times daily. Patient taking differently: Take 325 mg by mouth daily. 06/21/22 11/04/22 Yes Little Ishikawa, MD  hydrOXYzine (ATARAX) 10 MG tablet Take 2 tablets by mouth at bedtime 06/24/22  Yes   rosuvastatin (CRESTOR) 10 MG tablet Take 1 tablet by mouth once daily  12/28/21  Yes   sertraline (ZOLOFT) 100 MG tablet Take 1 tablet (100 mg total) by mouth daily. 09/15/22  Yes   celecoxib (CELEBREX) 200 MG capsule Take 1 capsule (200 mg total) by mouth daily with food for pain and swelling. 08/18/22     cyanocobalamin (VITAMIN B12) 1000 MCG/ML injection Inject 1 mL (1,000 mcg total) into the muscle every 30 (thirty) days. 08/24/22     ferrous sulfate 325 (65 FE) MG tablet Take 325 mg by mouth daily with breakfast.    [provider]  SYRINGE-NEEDLE, DISP, 3 ML 18G X 1-1/2" 3 ML MISC to draw up cyanocobalamin to inject  once a week 07/22/22     NEEDLE, DISP, 23 G (BD DISP NEEDLE) 23G X 1"  MISC Use to adminster the B-12 into the muscle once a week 07/22/22       Current Outpatient Medications  Medication Sig Dispense Refill   ammonium lactate (LAC-HYDRIN) 12 % lotion Apply on the skin twice a day 226 g 2   Azelaic Acid (FINACEA) 15 % gel Apply to affected area daily 50 g 1   calcipotriene-betamethasone (TACLONEX) external suspension Apply to affected area daily (Patient taking differently: Apply 1 application  topically daily as needed (psoriasis).) 60 g 2   ergocalciferol (VITAMIN D2) 1.25 MG (50000 UT) capsule Take 1 capsule by mouth once a week 8 capsule 0   esomeprazole (NEXIUM) 40 MG capsule Take 1 capsule by mouth once a day 90 capsule 1   ferrous sulfate 325 (65 FE) MG EC tablet Take 1 tablet (325 mg total) by mouth 2 (two) times daily. (Patient taking differently: Take 325 mg by mouth daily.) 60 tablet 3   hydrOXYzine (ATARAX) 10 MG tablet Take 2 tablets by mouth at bedtime 180 tablet 0   rosuvastatin (CRESTOR) 10 MG tablet Take 1 tablet by mouth once daily 90 tablet 1   sertraline (ZOLOFT) 100 MG tablet Take 1 tablet (100 mg total) by mouth daily. 90 tablet 1   celecoxib (CELEBREX) 200 MG capsule Take 1 capsule (200 mg total) by mouth daily with food for pain and swelling. 30 capsule 3   cyanocobalamin (VITAMIN B12) 1000 MCG/ML injection Inject 1 mL (1,000 mcg total) into the muscle every 30 (thirty) days. 3 mL 1   ferrous sulfate 325 (65 FE) MG tablet Take 325 mg by mouth daily with breakfast.     SYRINGE-NEEDLE, DISP, 3 ML 18G X 1-1/2" 3 ML MISC to draw up cyanocobalamin to inject  once a week 5 each 1   NEEDLE, DISP, 23 G (BD DISP NEEDLE) 23G X 1" MISC Use to adminster the B-12 into the muscle once a week 5 each 1   Current Facility-Administered Medications  Medication Dose Route Frequency Provider Last Rate Last Admin   0.9 %  sodium chloride infusion  500 mL Intravenous Continuous Gatha Mayer, MD        Allergies as of 11/04/2022 - Review Complete 11/04/2022   Allergen Reaction Noted   Sulfa antibiotics Other (See Comments) 12/28/2013    Family History  Problem Relation Age of Onset   Stomach cancer Paternal Grandmother    Hypertension Mother    Hyperlipidemia Mother    Thyroid disease Mother    Obesity Mother    Colon cancer Neg Hx    Esophageal cancer Neg Hx    Rectal cancer Neg Hx    Allergic rhinitis Neg Hx    Asthma Neg Hx    Eczema  Neg Hx    Urticaria Neg Hx     Social History   Socioeconomic History   Marital status: Divorced    Spouse name: Not on file   Number of children: Not on file   Years of education: Not on file   Highest education level: Not on file  Occupational History   Occupation: RN  Tobacco Use   Smoking status: Never   Smokeless tobacco: Never  Vaping Use   Vaping Use: Never used  Substance and Sexual Activity   Alcohol use: No   Drug use: No   Sexual activity: Not on file  Other Topics Concern   Not on file  Social History Narrative   Not on file   Social Determinants of Health   Financial Resource Strain: Not on file  Food Insecurity: Not on file  Transportation Needs: Not on file  Physical Activity: Not on file  Stress: Not on file  Social Connections: Not on file  Intimate Partner Violence: Not on file    Review of Systems:  All other review of systems negative except as mentioned in the HPI.  Physical Exam: Vital signs BP 127/72   Pulse (!) 102   Temp 99.5 F (37.5 C)   Ht 5' 3.5" (1.613 m)   Wt 237 lb 9.6 oz (107.8 kg)   SpO2 97%   BMI 41.43 kg/m   General:   Alert,  Well-developed, well-nourished, pleasant and cooperative in NAD Lungs:  Clear throughout to auscultation.   Heart:  Regular rate and rhythm; no murmurs, clicks, rubs,  or gallops. Abdomen:  Soft, nontender and nondistended. Normal bowel sounds.   Neuro/Psych:  Alert and cooperative. Normal mood and affect. A and O x 3   '@Acadia Thammavong'$  Simonne Maffucci, MD, [facility] [phone]  (pager) 11/04/2022 3:37 PM@

## 2022-11-04 NOTE — Op Note (Signed)
[facility] Patient Name: Dana Williams Procedure Date: 11/04/2022 3:38 PM MRN: [id number] Endoscopist: Gatha Mayer , MD, [id number] Age: 61 Referring MD:  Date of Birth: 1961/10/17 Gender: Female Account #: [id number] Procedure:                Upper GI endoscopy Indications:              Iron deficiency anemia Medicines:                Monitored Anesthesia Care Procedure:                Pre-Anesthesia Assessment:                           - Prior to the procedure, a History and Physical                            was performed, and patient medications and                            allergies were reviewed. The patient's tolerance of                            previous anesthesia was also reviewed. The risks                            and benefits of the procedure and the sedation                            options and risks were discussed with the patient.                            All questions were answered, and informed consent                            was obtained. Prior Anticoagulants: The patient has                            taken no anticoagulant or antiplatelet agents. ASA                            Grade Assessment: III - A patient with severe                            systemic disease. After reviewing the risks and                            benefits, the patient was deemed in satisfactory                            condition to undergo the procedure.                           After obtaining informed consent, the endoscope was  passed under direct vision. Throughout the                            procedure, the patient's blood pressure, pulse, and                            oxygen saturations were monitored continuously. The                            Endoscope was introduced through the mouth, and                            advanced to the second part of duodenum. The upper                            GI endoscopy was  accomplished without difficulty.                            The patient tolerated the procedure well. Scope In: Scope Out: Findings:                 A large hiatal hernia with multiple Cameron ulcers                            was found. The hiatal narrowing was 40 cm from the                            incisors. The Z-line was 33 cm from the incisors.                           The gastroesophageal flap valve was visualized                            endoscopically and classified as Hill Grade IV (no                            fold, wide open lumen, hiatal hernia present).                           The exam was otherwise without abnormality.                           The cardia and gastric fundus were normal on                            retroflexion.                           Biopsies were taken with a cold forceps in the                            gastric body and in the gastric antrum for  histology. Verification of patient identification                            for the specimen was done. Estimated blood loss was                            minimal. Complications:            No immediate complications. Estimated Blood Loss:     Estimated blood loss was minimal. Impression:               - Large hiatal hernia with multiple Cameron ulcers.                            These are cause of iron deficiency anemia                           - Gastroesophageal flap valve classified as Hill                            Grade IV (no fold, wide open lumen, hiatal hernia                            present).                           - The examination was otherwise normal.                           - Biopsies were taken with a cold forceps for                            histology in the gastric body and in the gastric                            antrum. Evaluate for H pylori and any link to                            concomitant B12 deficiency Recommendation:           - See  the other procedure note for documentation of                            additional recommendations. Colonoscopy next                           - Await pathology results. Stay on iron and B12 Gatha Mayer, MD 11/04/2022 4:24:31 PM This report has been signed electronically.

## 2022-11-04 NOTE — Progress Notes (Signed)
1538 Robinul 0.1 mg IV given due large amount of secretions upon assessment.  MD made aware, vss

## 2022-11-04 NOTE — Patient Instructions (Addendum)
You are leaking blood slowly from a hiatal hernia. The diaphragm pinches it and causes erosions or little ulcers that are called Cameron's lesions. This is not dangerous. It will require chronic iron supplements. Hiatal hernia surgery can fix this but typically need BMI < 35 to have surgery for that. PPI therapy is not known to help this with certainty.  I took biopsies to check for H pylori.  The colonoscopy revealed a 5 mm polyp and diverticulosis.  I will contact you with pathology information and follow-up plans.  Please resume regular diet and medications.  I appreciate the opportunity to care for you. Gatha Mayer, MD, FACG  YOU HAD AN ENDOSCOPIC PROCEDURE TODAY AT [facility]:   Refer to the procedure report that was given to you for any specific questions about what was found during the examination.  If the procedure report does not answer your questions, please call your gastroenterologist to clarify.  If you requested that your care partner not be given the details of your procedure findings, then the procedure report has been included in a sealed envelope for you to review at your convenience later.  YOU SHOULD EXPECT: Some feelings of bloating in the abdomen. Passage of more gas than usual.  Walking can help get rid of the air that was put into your GI tract during the procedure and reduce the bloating. If you had a lower endoscopy (such as a colonoscopy or flexible sigmoidoscopy) you may notice spotting of blood in your stool or on the toilet paper. If you underwent a bowel prep for your procedure, you may not have a normal bowel movement for a few days.  Please Note:  You might notice some irritation and congestion in your nose or some drainage.  This is from the oxygen used during your procedure.  There is no need for concern and it should clear up in a day or so.  SYMPTOMS TO REPORT IMMEDIATELY:  Following lower endoscopy (colonoscopy or flexible  sigmoidoscopy):  Excessive amounts of blood in the stool  Significant tenderness or worsening of abdominal pains  Swelling of the abdomen that is new, acute  Fever of 100F or higher  Following upper endoscopy (EGD)  Vomiting of blood or coffee ground material  New chest pain or pain under the shoulder blades  Painful or persistently difficult swallowing  New shortness of breath  Fever of 100F or higher  Black, tarry-looking stools  For urgent or emergent issues, a gastroenterologist can be reached at any hour by calling [phone]. Do not use MyChart messaging for urgent concerns.    DIET:  We do recommend a small meal at first, but then you may proceed to your regular diet.  Drink plenty of fluids but you should avoid alcoholic beverages for 24 hours.  ACTIVITY:  You should plan to take it easy for the rest of today and you should NOT DRIVE or use heavy machinery until tomorrow (because of the sedation medicines used during the test).    FOLLOW UP: Our staff will call the number listed on your records the next business day following your procedure.  We will call around 7:15- 8:00 am to check on you and address any questions or concerns that you may have regarding the information given to you following your procedure. If we do not reach you, we will leave a message.     If any biopsies were taken you will be contacted by phone or by letter within  the next 1-3 weeks.  Please call us at [phone] if you have not heard about the biopsies in 3 weeks.    SIGNATURES/CONFIDENTIALITY: You and/or your care partner have signed paperwork which will be entered into your electronic medical record.  These signatures attest to the fact that that the information above on your After Visit Summary has been reviewed and is understood.  Full responsibility of the confidentiality of this discharge information lies with you and/or your care-partner.

## 2022-11-05 ENCOUNTER — Inpatient Hospital Stay: Payer: 59 | Attending: Oncology

## 2022-11-05 ENCOUNTER — Telehealth: Payer: Self-pay | Admitting: *Deleted

## 2022-11-05 ENCOUNTER — Inpatient Hospital Stay (HOSPITAL_BASED_OUTPATIENT_CLINIC_OR_DEPARTMENT_OTHER): Payer: 59 | Admitting: Oncology

## 2022-11-05 VITALS — BP 149/75 | HR 96 | Temp 98.1°F | Resp 18 | Ht 63.5 in | Wt 236.7 lb

## 2022-11-05 DIAGNOSIS — D508 Other iron deficiency anemias: Secondary | ICD-10-CM | POA: Diagnosis not present

## 2022-11-05 DIAGNOSIS — D509 Iron deficiency anemia, unspecified: Secondary | ICD-10-CM | POA: Diagnosis not present

## 2022-11-05 LAB — CBC WITH DIFFERENTIAL (CANCER CENTER ONLY)
Abs Immature Granulocytes: 0.02 10*3/uL (ref 0.00–0.07)
Basophils Absolute: 0.1 10*3/uL (ref 0.0–0.1)
Basophils Relative: 1 %
Eosinophils Absolute: 0.1 10*3/uL (ref 0.0–0.5)
Eosinophils Relative: 2 %
HCT: 28.5 % — ABNORMAL LOW (ref 36.0–46.0)
Hemoglobin: 8.3 g/dL — ABNORMAL LOW (ref 12.0–15.0)
Immature Granulocytes: 0 %
Lymphocytes Relative: 38 %
Lymphs Abs: 2 10*3/uL (ref 0.7–4.0)
MCH: 22.9 pg — ABNORMAL LOW (ref 26.0–34.0)
MCHC: 29.1 g/dL — ABNORMAL LOW (ref 30.0–36.0)
MCV: 78.5 fL — ABNORMAL LOW (ref 80.0–100.0)
Monocytes Absolute: 0.4 10*3/uL (ref 0.1–1.0)
Monocytes Relative: 7 %
Neutro Abs: 2.7 10*3/uL (ref 1.7–7.7)
Neutrophils Relative %: 52 %
Platelet Count: 352 10*3/uL (ref 150–400)
RBC: 3.63 MIL/uL — ABNORMAL LOW (ref 3.87–5.11)
RDW: 14.7 % (ref 11.5–15.5)
WBC Count: 5.3 10*3/uL (ref 4.0–10.5)
nRBC: 0 % (ref 0.0–0.2)

## 2022-11-05 LAB — FERRITIN: Ferritin: 4 ng/mL — ABNORMAL LOW (ref 11–307)

## 2022-11-05 LAB — IRON AND IRON BINDING CAPACITY (CC-WL,HP ONLY)
Iron: 188 ug/dL — ABNORMAL HIGH (ref 28–170)
Saturation Ratios: 31 % (ref 10.4–31.8)
TIBC: 601 ug/dL — ABNORMAL HIGH (ref 250–450)
UIBC: 413 ug/dL (ref 148–442)

## 2022-11-05 NOTE — Progress Notes (Signed)
Hematology and Oncology Follow Up Visit  Dana Williams [id number] 1961/10/09 61 y.o. 11/05/2022 1:28 PM Dana Williams, NPRankins, Dana Salinas, MD   Principle Diagnosis: 61 year old woman with iron deficiency anemia diagnosed in August 2023.  She was found to have a hemoglobin of 5.3 with ferritin of 3.  Prior Therapy: Ferrlecit 250 mg completed on June 20, 2022.  Current therapy: Iron sulfate 325 mg once a day since August 2023.  Interim History: Dana Williams returns today for a follow-up.  Since the last visit, she reports no major changes in her health.  She has been taking oral iron daily and for the most part been compliant with it also she is on Nexium which she has been taking almost daily.  She underwent colonoscopy and endoscopy by Dr. Carlean Purl showed hiatal hernia but no active bleeding noted.  She denies any hematochezia, melena or hemoptysis.  She denies any excessive fatigue or tiredness.     Medications: I have reviewed the patient's current medications.  Current Outpatient Medications  Medication Sig Dispense Refill   ammonium lactate (LAC-HYDRIN) 12 % lotion Apply on the skin twice a day 226 g 2   Azelaic Acid (FINACEA) 15 % gel Apply to affected area daily 50 g 1   calcipotriene-betamethasone (TACLONEX) external suspension Apply to affected area daily (Patient taking differently: Apply 1 application  topically daily as needed (psoriasis).) 60 g 2   celecoxib (CELEBREX) 200 MG capsule Take 1 capsule (200 mg total) by mouth daily with food for pain and swelling. 30 capsule 3   cyanocobalamin (VITAMIN B12) 1000 MCG/ML injection Inject 1 mL (1,000 mcg total) into the muscle every 30 (thirty) days. 3 mL 1   ergocalciferol (VITAMIN D2) 1.25 MG (50000 UT) capsule Take 1 capsule by mouth once a week 8 capsule 0   esomeprazole (NEXIUM) 40 MG capsule Take 1 capsule by mouth once a day 90 capsule 1   ferrous sulfate 325 (65 FE) MG EC tablet Take 1 tablet (325 mg total) by mouth 2  (two) times daily. (Patient taking differently: Take 325 mg by mouth daily.) 60 tablet 3   ferrous sulfate 325 (65 FE) MG tablet Take 325 mg by mouth daily with breakfast.     hydrOXYzine (ATARAX) 10 MG tablet Take 2 tablets by mouth at bedtime 180 tablet 0   SYRINGE-NEEDLE, DISP, 3 ML 18G X 1-1/2" 3 ML MISC to draw up cyanocobalamin to inject  once a week 5 each 1   NEEDLE, DISP, 23 G (BD DISP NEEDLE) 23G X 1" MISC Use to adminster the B-12 into the muscle once a week 5 each 1   rosuvastatin (CRESTOR) 10 MG tablet Take 1 tablet by mouth once daily 90 tablet 1   sertraline (ZOLOFT) 100 MG tablet Take 1 tablet (100 mg total) by mouth daily. 90 tablet 1   No current facility-administered medications for this visit.     Allergies:  Allergies  Allergen Reactions   Sulfa Antibiotics Other (See Comments)    Mouth ulcers      Physical Exam: Blood pressure (!) 149/75, pulse 96, temperature 98.1 F (36.7 C), temperature source Temporal, resp. rate 18, height 5' 3.5" (1.613 m), weight 236 lb 11.2 oz (107.4 kg), SpO2 97 %.  ECOG: 1   General appearance: Comfortable appearing without any discomfort Head: Normocephalic without any trauma Oropharynx: Mucous membranes are moist and pink without any thrush or ulcers. Eyes: Pupils are equal and round reactive to light. Lymph nodes: No cervical,  supraclavicular, inguinal or axillary lymphadenopathy.   Heart:regular rate and rhythm.  S1 and S2 without leg edema. Lung: Clear without any rhonchi or wheezes.  No dullness to percussion. Abdomin: Soft, nontender, nondistended with good bowel sounds.  No hepatosplenomegaly. Musculoskeletal: No joint deformity or effusion.  Full range of motion noted. Neurological: No deficits noted on motor, sensory and deep tendon reflex exam. Skin: No petechial rash or dryness.  Appeared moist.     Lab Results: Lab Results  Component Value Date   WBC 6.6 10/22/2022   HGB 9.0 (L) 10/22/2022   HCT 31.6 (L)  10/22/2022   MCV 82.5 10/22/2022   PLT 334 10/22/2022     Chemistry      Component Value Date/Time   NA 138 06/20/2022 0752   NA 145 12/29/2021 0000   K 3.8 06/20/2022 0752   CL 107 06/20/2022 0752   CO2 26 06/20/2022 0752   BUN 7 (L) 06/20/2022 0752   BUN 13 12/29/2021 0000   CREATININE 0.70 06/20/2022 0752   GLU 113 12/29/2021 0000      Component Value Date/Time   CALCIUM 8.7 (L) 06/20/2022 0752   ALKPHOS 111 06/19/2022 1440   AST 16 06/19/2022 1440   ALT 10 06/19/2022 1440   BILITOT 0.5 06/19/2022 1440   BILITOT 0.3 05/28/2021 0849          Impression and Plan:   61 year old with:   1.  Iron deficiency anemia diagnosed in August 2023.  Etiology likely related to GI blood losses or poor absorption.  Colonoscopy and endoscopy completed on November 04, 2022 showed no active bleeding or clear-cut etiology.  She did have a hiatal hernia.  Treatment options moving forward were discussed at this time.  Continuing oral iron supplements versus repeat intravenous iron infusion were reiterated.  Complication associated with IV iron include arthralgias, myalgias and rarely anaphylaxis were reiterated.  Laboratory data from today reviewed and showed less than adequate response to oral iron replacement at this time.    After discussion she is agreeable to proceed with IV iron.  She will receive a total of 1000 mg of Venofer.  We will recheck her levels in the next 3 to 4 months and repeat infusion if needed.      2.  Follow-up: Will be in the next 3 to 4 months for repeat follow-up.   30  minutes were spent on this encounter.  The time was dedicated to updating disease status, treatment choices and outlining future plan of care discussion.   Zola Button, MD 12/22/20231:28 PM

## 2022-11-05 NOTE — Telephone Encounter (Signed)
Attempted to call patient for their post-procedure follow-up call. No answer. Left voicemail.   

## 2022-11-09 ENCOUNTER — Encounter: Payer: Self-pay | Admitting: Oncology

## 2022-11-12 ENCOUNTER — Encounter: Payer: Self-pay | Admitting: Internal Medicine

## 2022-11-12 ENCOUNTER — Inpatient Hospital Stay: Payer: 59

## 2022-11-12 VITALS — BP 121/70 | HR 84 | Temp 98.2°F | Resp 18

## 2022-11-12 DIAGNOSIS — D508 Other iron deficiency anemias: Secondary | ICD-10-CM

## 2022-11-12 DIAGNOSIS — D509 Iron deficiency anemia, unspecified: Secondary | ICD-10-CM | POA: Diagnosis not present

## 2022-11-12 MED ORDER — SODIUM CHLORIDE 0.9 % IV SOLN
200.0000 mg | Freq: Once | INTRAVENOUS | Status: AC
  Administered 2022-11-12: 200 mg via INTRAVENOUS
  Filled 2022-11-12: qty 200

## 2022-11-12 MED ORDER — SODIUM CHLORIDE 0.9 % IV SOLN: Freq: Once | INTRAVENOUS | Status: AC

## 2022-11-12 NOTE — Patient Instructions (Signed)

## 2022-11-16 ENCOUNTER — Inpatient Hospital Stay: Payer: Commercial Managed Care - PPO | Attending: Oncology

## 2022-11-16 ENCOUNTER — Other Ambulatory Visit: Payer: Self-pay

## 2022-11-16 VITALS — BP 141/80 | HR 89 | Temp 98.2°F | Resp 18

## 2022-11-16 DIAGNOSIS — D508 Other iron deficiency anemias: Secondary | ICD-10-CM

## 2022-11-16 DIAGNOSIS — D509 Iron deficiency anemia, unspecified: Secondary | ICD-10-CM | POA: Diagnosis not present

## 2022-11-16 MED ORDER — SODIUM CHLORIDE 0.9 % IV SOLN
200.0000 mg | Freq: Once | INTRAVENOUS | Status: AC
  Administered 2022-11-16: 200 mg via INTRAVENOUS
  Filled 2022-11-16: qty 200

## 2022-11-16 MED ORDER — SODIUM CHLORIDE 0.9 % IV SOLN: Freq: Once | INTRAVENOUS | Status: AC

## 2022-11-16 NOTE — Patient Instructions (Signed)

## 2022-11-16 NOTE — Progress Notes (Signed)
Patient observed for 30 minutes following venofer administration of infusion. Vital signs retaken and remained stable. Patient showed no signs of distress upon discharge.

## 2022-11-21 ENCOUNTER — Other Ambulatory Visit (HOSPITAL_COMMUNITY): Payer: Self-pay

## 2022-11-22 ENCOUNTER — Other Ambulatory Visit: Payer: Self-pay

## 2022-11-22 ENCOUNTER — Other Ambulatory Visit (HOSPITAL_COMMUNITY): Payer: Self-pay

## 2022-11-22 ENCOUNTER — Encounter: Payer: Self-pay | Admitting: Oncology

## 2022-11-22 MED ORDER — ESOMEPRAZOLE MAGNESIUM 40 MG PO CPDR
40.0000 mg | DELAYED_RELEASE_CAPSULE | Freq: Every day | ORAL | 1 refills | Status: DC
  Filled 2022-11-22: qty 90, 90d supply, fill #0
  Filled 2023-03-07: qty 90, 90d supply, fill #1

## 2022-11-23 ENCOUNTER — Other Ambulatory Visit: Payer: Self-pay

## 2022-11-25 DIAGNOSIS — H524 Presbyopia: Secondary | ICD-10-CM | POA: Diagnosis not present

## 2022-11-26 ENCOUNTER — Other Ambulatory Visit: Payer: Self-pay

## 2022-11-26 ENCOUNTER — Inpatient Hospital Stay: Payer: Commercial Managed Care - PPO

## 2022-11-26 VITALS — BP 122/78 | HR 78 | Temp 98.6°F | Resp 18

## 2022-11-26 DIAGNOSIS — D508 Other iron deficiency anemias: Secondary | ICD-10-CM

## 2022-11-26 DIAGNOSIS — D509 Iron deficiency anemia, unspecified: Secondary | ICD-10-CM | POA: Diagnosis not present

## 2022-11-26 MED ORDER — SODIUM CHLORIDE 0.9 % IV SOLN
200.0000 mg | Freq: Once | INTRAVENOUS | Status: AC
  Administered 2022-11-26: 200 mg via INTRAVENOUS
  Filled 2022-11-26: qty 200

## 2022-11-26 MED ORDER — SODIUM CHLORIDE 0.9 % IV SOLN: Freq: Once | INTRAVENOUS | Status: AC

## 2022-11-26 NOTE — Patient Instructions (Signed)

## 2022-11-30 ENCOUNTER — Inpatient Hospital Stay: Payer: Commercial Managed Care - PPO

## 2022-12-02 ENCOUNTER — Other Ambulatory Visit: Payer: Self-pay

## 2022-12-02 ENCOUNTER — Inpatient Hospital Stay: Payer: Commercial Managed Care - PPO

## 2022-12-02 VITALS — BP 145/86 | HR 77 | Temp 98.3°F | Resp 17

## 2022-12-02 DIAGNOSIS — D509 Iron deficiency anemia, unspecified: Secondary | ICD-10-CM | POA: Diagnosis not present

## 2022-12-02 DIAGNOSIS — D508 Other iron deficiency anemias: Secondary | ICD-10-CM

## 2022-12-02 MED ORDER — SODIUM CHLORIDE 0.9 % IV SOLN: Freq: Once | INTRAVENOUS | Status: AC

## 2022-12-02 MED ORDER — SODIUM CHLORIDE 0.9 % IV SOLN
200.0000 mg | Freq: Once | INTRAVENOUS | Status: AC
  Administered 2022-12-02: 200 mg via INTRAVENOUS
  Filled 2022-12-02: qty 200

## 2022-12-02 NOTE — Progress Notes (Signed)
Patient declined to stay for 30 minute post infusion observation.

## 2022-12-05 ENCOUNTER — Other Ambulatory Visit (HOSPITAL_COMMUNITY): Payer: Self-pay

## 2022-12-06 ENCOUNTER — Inpatient Hospital Stay: Payer: Commercial Managed Care - PPO

## 2022-12-06 ENCOUNTER — Other Ambulatory Visit: Payer: Self-pay

## 2022-12-06 ENCOUNTER — Other Ambulatory Visit (HOSPITAL_COMMUNITY): Payer: Self-pay

## 2022-12-06 VITALS — BP 128/79 | HR 88 | Temp 98.9°F | Resp 14

## 2022-12-06 DIAGNOSIS — D508 Other iron deficiency anemias: Secondary | ICD-10-CM

## 2022-12-06 DIAGNOSIS — D509 Iron deficiency anemia, unspecified: Secondary | ICD-10-CM | POA: Diagnosis not present

## 2022-12-06 MED ORDER — SODIUM CHLORIDE 0.9 % IV SOLN
200.0000 mg | Freq: Once | INTRAVENOUS | Status: AC
  Administered 2022-12-06: 200 mg via INTRAVENOUS
  Filled 2022-12-06: qty 200

## 2022-12-06 MED ORDER — CYANOCOBALAMIN 1000 MCG/ML IJ SOLN
1000.0000 ug | INTRAMUSCULAR | 0 refills | Status: DC
  Filled 2022-12-06 – 2023-03-07 (×3): qty 3, 90d supply, fill #0

## 2022-12-06 MED ORDER — SODIUM CHLORIDE 0.9 % IV SOLN: Freq: Once | INTRAVENOUS | Status: AC

## 2022-12-06 NOTE — Patient Instructions (Signed)

## 2022-12-06 NOTE — Progress Notes (Signed)
Patient declined 30 min post observation period.  VS stable, no c/o SOB.  Patient ambulated to lobby.  Angie Fava, RN

## 2022-12-08 ENCOUNTER — Other Ambulatory Visit (HOSPITAL_COMMUNITY): Payer: Self-pay

## 2022-12-08 ENCOUNTER — Encounter: Payer: Self-pay | Admitting: Oncology

## 2022-12-08 ENCOUNTER — Encounter (INDEPENDENT_AMBULATORY_CARE_PROVIDER_SITE_OTHER): Payer: Self-pay | Admitting: Family Medicine

## 2022-12-08 ENCOUNTER — Other Ambulatory Visit: Payer: Self-pay

## 2022-12-08 DIAGNOSIS — Z0289 Encounter for other administrative examinations: Secondary | ICD-10-CM

## 2022-12-08 MED ORDER — AMMONIUM LACTATE 12 % EX LOTN
TOPICAL_LOTION | Freq: Two times a day (BID) | CUTANEOUS | 2 refills | Status: AC
  Filled 2022-12-08: qty 226, fill #0

## 2022-12-08 MED ORDER — AMMONIUM LACTATE 12 % EX LOTN
TOPICAL_LOTION | Freq: Two times a day (BID) | CUTANEOUS | 2 refills | Status: DC
  Filled 2022-12-08: qty 225, 30d supply, fill #0

## 2022-12-09 ENCOUNTER — Ambulatory Visit: Payer: Commercial Managed Care - PPO

## 2022-12-09 ENCOUNTER — Other Ambulatory Visit (HOSPITAL_COMMUNITY): Payer: Self-pay

## 2022-12-09 ENCOUNTER — Other Ambulatory Visit: Payer: Self-pay

## 2022-12-09 MED ORDER — ROSUVASTATIN CALCIUM 10 MG PO TABS
10.0000 mg | ORAL_TABLET | Freq: Every day | ORAL | 1 refills | Status: DC
  Filled 2022-12-09: qty 90, 90d supply, fill #0

## 2022-12-10 ENCOUNTER — Other Ambulatory Visit: Payer: Self-pay

## 2023-01-04 ENCOUNTER — Other Ambulatory Visit (HOSPITAL_COMMUNITY): Payer: Self-pay

## 2023-01-05 ENCOUNTER — Other Ambulatory Visit: Payer: Self-pay

## 2023-01-05 DIAGNOSIS — D509 Iron deficiency anemia, unspecified: Secondary | ICD-10-CM

## 2023-01-06 ENCOUNTER — Encounter (INDEPENDENT_AMBULATORY_CARE_PROVIDER_SITE_OTHER): Payer: Self-pay | Admitting: Family Medicine

## 2023-01-06 ENCOUNTER — Ambulatory Visit (INDEPENDENT_AMBULATORY_CARE_PROVIDER_SITE_OTHER): Payer: Commercial Managed Care - PPO | Admitting: Family Medicine

## 2023-01-06 ENCOUNTER — Other Ambulatory Visit (INDEPENDENT_AMBULATORY_CARE_PROVIDER_SITE_OTHER): Payer: Self-pay | Admitting: Family Medicine

## 2023-01-06 DIAGNOSIS — R7303 Prediabetes: Secondary | ICD-10-CM

## 2023-01-06 DIAGNOSIS — R030 Elevated blood-pressure reading, without diagnosis of hypertension: Secondary | ICD-10-CM | POA: Diagnosis not present

## 2023-01-06 DIAGNOSIS — E785 Hyperlipidemia, unspecified: Secondary | ICD-10-CM | POA: Diagnosis not present

## 2023-01-06 DIAGNOSIS — E559 Vitamin D deficiency, unspecified: Secondary | ICD-10-CM

## 2023-01-06 DIAGNOSIS — D508 Other iron deficiency anemias: Secondary | ICD-10-CM | POA: Diagnosis not present

## 2023-01-06 DIAGNOSIS — Z6839 Body mass index (BMI) 39.0-39.9, adult: Secondary | ICD-10-CM

## 2023-01-10 LAB — ANEMIA PANEL
Ferritin: 34 ng/mL (ref 15–150)
Folate, Hemolysate: 301 ng/mL
Folate, RBC: 829 ng/mL (ref >498)
Hematocrit: 36.3 % (ref 34.0–46.6)
Iron Saturation: 9 % — CL (ref 15–55)
Iron: 43 ug/dL (ref 27–139)
Retic Ct Pct: 1.2 % (ref 0.6–2.6)
Total Iron Binding Capacity: 489 ug/dL — ABNORMAL HIGH (ref 250–450)
UIBC: 446 ug/dL — ABNORMAL HIGH (ref 118–369)
Vitamin B-12: 286 pg/mL (ref 232–1245)

## 2023-01-10 LAB — LIPID PANEL WITH LDL/HDL RATIO
Cholesterol, Total: 193 mg/dL (ref 100–199)
HDL: 83 mg/dL (ref >39)
LDL Chol Calc (NIH): 89 mg/dL (ref 0–99)
LDL/HDL Ratio: 1.1 ratio (ref 0.0–3.2)
Triglycerides: 120 mg/dL (ref 0–149)
VLDL Cholesterol Cal: 21 mg/dL (ref 5–40)

## 2023-01-10 LAB — CMP14+EGFR
ALT: 16 IU/L (ref 0–32)
AST: 17 IU/L (ref 0–40)
Albumin/Globulin Ratio: 1.9 (ref 1.2–2.2)
Albumin: 4.2 g/dL (ref 3.9–4.9)
Alkaline Phosphatase: 150 IU/L — ABNORMAL HIGH (ref 44–121)
BUN/Creatinine Ratio: 16 (ref 12–28)
BUN: 12 mg/dL (ref 8–27)
Bilirubin Total: 0.3 mg/dL (ref 0.0–1.2)
CO2: 22 mmol/L (ref 20–29)
Calcium: 9.4 mg/dL (ref 8.7–10.3)
Chloride: 107 mmol/L — ABNORMAL HIGH (ref 96–106)
Creatinine, Ser: 0.73 mg/dL (ref 0.57–1.00)
Globulin, Total: 2.2 g/dL (ref 1.5–4.5)
Glucose: 104 mg/dL — ABNORMAL HIGH (ref 70–99)
Potassium: 3.9 mmol/L (ref 3.5–5.2)
Sodium: 147 mmol/L — ABNORMAL HIGH (ref 134–144)
Total Protein: 6.4 g/dL (ref 6.0–8.5)
eGFR: 93 mL/min/{1.73_m2} (ref >59)

## 2023-01-10 LAB — HEMOGLOBIN A1C
Est. average glucose Bld gHb Est-mCnc: 111 mg/dL
Hgb A1c MFr Bld: 5.5 % (ref 4.8–5.6)

## 2023-01-10 LAB — TSH: TSH: 1.35 u[IU]/mL (ref 0.450–4.500)

## 2023-01-10 LAB — VITAMIN D 25 HYDROXY (VIT D DEFICIENCY, FRACTURES): Vit D, 25-Hydroxy: 25.5 ng/mL — ABNORMAL LOW (ref 30.0–100.0)

## 2023-01-10 LAB — T3: T3, Total: 132 ng/dL (ref 71–180)

## 2023-01-10 LAB — T4, FREE: Free T4: 1.08 ng/dL (ref 0.82–1.77)

## 2023-01-10 LAB — INSULIN, RANDOM: INSULIN: 13 u[IU]/mL (ref 2.6–24.9)

## 2023-01-18 NOTE — Progress Notes (Unsigned)
Chief Complaint:   OBESITY Dana Williams is here to discuss her progress with her obesity treatment plan along with follow-up of her obesity related diagnoses. Dana Williams is on the Category 3 Plan and states she is following her eating plan approximately 0% of the time. Dana Williams states she is doing 0 minutes 0 times per week.  Today's visit was #: 32 Starting weight: 236 lbs Starting date: 08/10/2018 Today's weight: 237 lbs Today's date: 01/06/2023 Total lbs lost to date: 0 Total lbs lost since last in-office visit: 0  Interim History: Dana Williams is returning to our clinic after a 12 month hiatus. She has gained back much of her weight that she lost. She is ready to work on her diet and exercise to improve her health. She also needs to decrease her BMI to 35 to have a hiatal hernia repair.   Subjective:   1. Prediabetes [place] has a history of pre-diabetes, and she is not on metformin. She is ready to work on decreasing simple carbohydrates and increase her exercise.   2. Hyperlipidemia, unspecified hyperlipidemia type Dana Williams is on Crestor with no side effects noted. She is ready to start working on decreasing cholesterol in her diet.   3. Vitamin D deficiency Dana Williams is not on Vitamin D, and her level is likely below goals. She notes fatigue.   4. Other iron deficiency anemia Dana Williams has a history of iron deficiency, along with recent bleeding hiatal hernia and she was given a transfusion last summer. Her most recent hemoglobin was 8.3 and her fatigue has worsened. She is no longer on iron supplementation.  5. Elevated blood pressure reading Dana Williams's blood pressure is normally within normal limits. She is not on antihypertensives currently. Elevation may be related to white coat syndrome.   Assessment/Plan:   1. Prediabetes We will check labs today. Dana Williams will start her eating plan.   - CMP14+EGFR - Insulin, random - Hemoglobin A1c  2. Hyperlipidemia, unspecified hyperlipidemia type We will  check labs today, and we will follow-up at Dana Williams next visit.   - Lipid Panel With LDL/HDL Ratio - TSH  3. Vitamin D deficiency We will check labs today, and we will follow-up at Dana Williams next visit.   - VITAMIN D 25 Hydroxy (Vit-D Deficiency, Fractures)  4. Other iron deficiency anemia We will check labs today, and will follow-up at her next visit. Dana Williams may need to see Hematology sooner than expected.   - CBC with Differential/Platelet - Folate - Iron and TIBC - Ferritin  5. Elevated blood pressure reading Dana Williams is to check her blood pressure at home, and we wil recheck her blood pressure in the office in 2 weeks.   6. Class 2 severe obesity due to excess calories with serious comorbidity and body mass index (BMI) of 39.0 to 39.9 in adult Dana Williams) Dana Williams is currently in the action stage of change. As such, her goal is to get back to weightloss efforts . She has agreed to the Category 3 Plan.   Behavioral modification strategies: increasing lean protein intake.  Dana Williams has agreed to follow-up with our clinic in 2 weeks. She was informed of the importance of frequent follow-up visits to maximize her success with intensive lifestyle modifications for her multiple health conditions.   Dana Williams was informed we would discuss her lab results at her next visit unless there is a critical issue that needs to be addressed sooner. Dana Williams agreed to keep her next visit at the agreed upon time to discuss these results.  Objective:   Pulse (!) (P) 126, temperature (P) 97.8 F (36.6 C), height (P) '5\' 5"'$  (1.651 m), weight (P) 237 lb (107.5 kg), SpO2 (P) 95 %. Body mass index is 39.44 kg/m (pended).  Lab Results  Component Value Date   CREATININE 0.73 01/06/2023   BUN 12 01/06/2023   NA 147 (H) 01/06/2023   K 3.9 01/06/2023   CL 107 (H) 01/06/2023   CO2 22 01/06/2023   Lab Results  Component Value Date   ALT 16 01/06/2023   AST 17 01/06/2023   ALKPHOS 150 (H) 01/06/2023   BILITOT 0.3  01/06/2023   Lab Results  Component Value Date   HGBA1C 5.5 01/06/2023   HGBA1C 5.3 06/20/2022   HGBA1C 6.0 12/29/2021   HGBA1C 5.8 (H) 05/28/2021   HGBA1C 5.7 (H) 05/28/2020   Lab Results  Component Value Date   INSULIN 13.0 01/06/2023   INSULIN 19.7 05/28/2021   INSULIN 19.4 05/28/2020   INSULIN 14.5 09/25/2019   INSULIN 11.5 12/12/2018   Lab Results  Component Value Date   TSH 1.350 01/06/2023   Lab Results  Component Value Date   CHOL 193 01/06/2023   HDL 83 01/06/2023   LDLCALC 89 01/06/2023   TRIG 120 01/06/2023   Lab Results  Component Value Date   VD25OH 25.5 (L) 01/06/2023   VD25OH 45.2 05/28/2021   VD25OH 41.0 05/28/2020   Lab Results  Component Value Date   WBC 5.3 11/05/2022   HGB 8.3 (L) 11/05/2022   HCT 36.3 01/06/2023   MCV 78.5 (L) 11/05/2022   PLT 352 11/05/2022   Lab Results  Component Value Date   IRON 43 01/06/2023   TIBC 489 (H) 01/06/2023   FERRITIN 34 01/06/2023   Attestation Statements:   Reviewed by clinician on day of visit: allergies, medications, problem list, medical history, surgical history, family history, social history, and previous encounter notes.  Time spent on visit including pre-visit chart review and post-visit care and charting was 40 minutes.   I, Trixie Dredge, am acting as transcriptionist for Dennard Nip, MD.  I have reviewed the above documentation for accuracy and completeness, and I agree with the above. -  ***

## 2023-01-24 ENCOUNTER — Ambulatory Visit (INDEPENDENT_AMBULATORY_CARE_PROVIDER_SITE_OTHER): Payer: Commercial Managed Care - PPO | Admitting: Physician Assistant

## 2023-01-24 ENCOUNTER — Encounter (INDEPENDENT_AMBULATORY_CARE_PROVIDER_SITE_OTHER): Payer: Self-pay

## 2023-01-25 ENCOUNTER — Ambulatory Visit (INDEPENDENT_AMBULATORY_CARE_PROVIDER_SITE_OTHER): Payer: Commercial Managed Care - PPO | Admitting: Family Medicine

## 2023-01-31 ENCOUNTER — Other Ambulatory Visit: Payer: Self-pay | Admitting: Thoracic Surgery (Cardiothoracic Vascular Surgery)

## 2023-01-31 DIAGNOSIS — Z6841 Body Mass Index (BMI) 40.0 and over, adult: Secondary | ICD-10-CM | POA: Diagnosis not present

## 2023-01-31 DIAGNOSIS — K449 Diaphragmatic hernia without obstruction or gangrene: Secondary | ICD-10-CM

## 2023-01-31 DIAGNOSIS — D508 Other iron deficiency anemias: Secondary | ICD-10-CM | POA: Diagnosis not present

## 2023-02-01 ENCOUNTER — Ambulatory Visit (HOSPITAL_COMMUNITY)
Admission: RE | Admit: 2023-02-01 | Discharge: 2023-02-01 | Disposition: A | Discharge status: Home / Self Care | Payer: Commercial Managed Care - PPO | Source: Ambulatory Visit | Attending: Thoracic Surgery (Cardiothoracic Vascular Surgery) | Admitting: Thoracic Surgery (Cardiothoracic Vascular Surgery)

## 2023-02-01 DIAGNOSIS — K449 Diaphragmatic hernia without obstruction or gangrene: Secondary | ICD-10-CM | POA: Diagnosis not present

## 2023-02-01 DIAGNOSIS — R0602 Shortness of breath: Secondary | ICD-10-CM | POA: Diagnosis not present

## 2023-02-02 ENCOUNTER — Encounter: Payer: Self-pay | Admitting: Internal Medicine

## 2023-02-04 ENCOUNTER — Encounter: Payer: Commercial Managed Care - PPO | Admitting: Thoracic Surgery (Cardiothoracic Vascular Surgery)

## 2023-02-08 ENCOUNTER — Ambulatory Visit (INDEPENDENT_AMBULATORY_CARE_PROVIDER_SITE_OTHER): Payer: Commercial Managed Care - PPO | Admitting: Family Medicine

## 2023-02-15 ENCOUNTER — Other Ambulatory Visit: Payer: Self-pay

## 2023-02-25 ENCOUNTER — Encounter: Payer: Self-pay | Admitting: *Deleted

## 2023-02-25 ENCOUNTER — Encounter: Payer: Self-pay | Admitting: Thoracic Surgery (Cardiothoracic Vascular Surgery)

## 2023-02-25 ENCOUNTER — Institutional Professional Consult (permissible substitution): Payer: Commercial Managed Care - PPO | Admitting: Thoracic Surgery (Cardiothoracic Vascular Surgery)

## 2023-02-25 ENCOUNTER — Other Ambulatory Visit: Payer: Self-pay | Admitting: *Deleted

## 2023-02-25 VITALS — BP 160/99 | HR 112 | Resp 20 | Ht 65.0 in | Wt 245.0 lb

## 2023-02-25 DIAGNOSIS — K449 Diaphragmatic hernia without obstruction or gangrene: Secondary | ICD-10-CM

## 2023-02-25 NOTE — H&P (View-Only) (Signed)
    [address]       ,[place] [address]             [phone]                    Brynna C Rafalski [facility] Medical Record #[id number] Date of Birth: 11/02/1961  Referring: Matthews, Stephanie, NP Primary Care: Matthews, Stephanie, NP Primary Cardiologist: None  Chief Complaint:   No chief complaint on file.   History of Present Illness:    Dana Williams 62 y.o. female ***  Hx of recurrent anemia.  GERD symptoms: *** Dysphagia: *** Odynophagia: *** Voice changes: *** Respiratory symptoms: *** Weight changes: *** Hx of Barrett's Esphagus: ***    Past Medical History:  Diagnosis Date   Anemia    Arthritis    Arthritis    Arthritis of pelvic region    Depression    Dry skin    Erythema multiforme    Fatigue    GERD (gastroesophageal reflux disease)    H/O psoriatic arthritis    DX  age 18---- in remission for 20 years   History of hiatal hernia    History of kidney stones    Hyperlipidemia    Joint pain    Kidney stones    Obesity    Personal history of colonic polyp - adenoma 01/29/2014   Plantar fasciitis of left foot    PONV (postoperative nausea and vomiting)    Postmenopausal    Psoriasis    Renal calculus, left    Sigmoid diverticulosis    Urticaria    Vitamin D deficiency     Past Surgical History:  Procedure Laterality Date   CLOSED MANIPULATION SHOULDER     COLONOSCOPY WITH PROPOFOL  01-25-2014   CYSTO/  BILATERAL RETROGRADE PYELOGRAM/  BILATERAL URETEROSCOPIC LASER LITHOTRIPSY STONE EXTRACTIONS/  BILATERAL STENT PLACEMENT  02-13-2010   CYSTOSCOPY WITH RETROGRADE PYELOGRAM, URETEROSCOPY AND STENT PLACEMENT Left 04/21/2015   Procedure: CYSTOSCOPY WITH RETROGRADE PYELOGRAM, URETEROSCOPY, STONE EXTRACTION AND STENT PLACEMENT;  Surgeon: Stephen Dahlstedt, MD;  Location: [facility];  Service: Urology;  Laterality: Left;   CYSTOURETHROSCOPY/  PLACEMENT LYNX SUPRAPUBIC SLING  05-24-2008   HAND TENDON SURGERY  Right 1993   HOLMIUM LASER APPLICATION Left 04/21/2015   Procedure: HOLMIUM LASER APPLICATION;  Surgeon: Stephen Dahlstedt, MD;  Location: [facility];  Service: Urology;  Laterality: Left;   LAPAROSCOPIC CHOLECYSTECTOMY  01-31-2002   LEFT URETEROSCOPIC STONE EXTRACTION/  STENT PLACEMENT  10-06-2005   RHINOPLASTY  2007   RHINOPLASTY     STRABISMUS SURGERY Left age 2   TOTAL KNEE ARTHROPLASTY Left 10/27/2016   Procedure: TOTAL KNEE ARTHROPLASTY;  Surgeon: Daniel F Murphy, MD;  Location: [facility];  Service: Orthopedics;  Laterality: Left;   WISDOM TOOTH EXTRACTION  age 15    Family History  Problem Relation Age of Onset   Stomach cancer Paternal Grandmother    Hypertension Mother    Hyperlipidemia Mother    Thyroid disease Mother    Obesity Mother    Colon cancer Neg Hx    Esophageal cancer Neg Hx    Rectal cancer Neg Hx    Allergic rhinitis Neg Hx    Asthma Neg Hx    Eczema Neg Hx    Urticaria Neg Hx      Social History   Tobacco Use  Smoking Status Never  Smokeless Tobacco Never    Social History   Substance   and Sexual Activity  Alcohol Use No     Allergies  Allergen Reactions   Sulfa Antibiotics Other (See Comments)    Mouth ulcers    Current Outpatient Medications  Medication Sig Dispense Refill   ammonium lactate (LAC-HYDRIN) 12 % lotion Apply on the skin twice a day 226 g 2   ammonium lactate (LAC-HYDRIN) 12 % lotion Apply 1 application on the skin twice a day 226 g 2   Azelaic Acid (FINACEA) 15 % gel Apply to affected area daily 50 g 1   calcipotriene-betamethasone (TACLONEX) external suspension Apply to affected area daily (Patient taking differently: Apply 1 application  topically daily as needed (psoriasis).) 60 g 2   celecoxib (CELEBREX) 200 MG capsule Take 1 capsule (200 mg total) by mouth daily with food for pain and swelling. 30 capsule 3   cyanocobalamin (VITAMIN B12) 1000 MCG/ML injection Inject 1 mL (1,000 mcg total) into the muscle  every 30 (thirty) days. 3 mL 0   ergocalciferol (VITAMIN D2) 1.25 MG (50000 UT) capsule Take 1 capsule by mouth once a week 8 capsule 0   esomeprazole (NEXIUM) 40 MG capsule Take 1 capsule (40 mg total) by mouth daily. 90 capsule 1   ferrous sulfate 325 (65 FE) MG EC tablet Take 1 tablet (325 mg total) by mouth 2 (two) times daily. (Patient taking differently: Take 325 mg by mouth daily.) 60 tablet 3   hydrOXYzine (ATARAX) 10 MG tablet Take 2 tablets by mouth at bedtime 180 tablet 0   NEEDLE, DISP, 23 G (BD DISP NEEDLE) 23G X 1" MISC Use to adminster the B-12 into the muscle once a week 5 each 1   rosuvastatin (CRESTOR) 10 MG tablet Take 1 tablet by mouth once daily 90 tablet 1   rosuvastatin (CRESTOR) 10 MG tablet Take 1 tablet (10 mg total) by mouth daily. 90 tablet 1   sertraline (ZOLOFT) 100 MG tablet Take 1 tablet (100 mg total) by mouth daily. 90 tablet 1   SYRINGE-NEEDLE, DISP, 3 ML 18G X 1-1/2" 3 ML MISC to draw up cyanocobalamin to inject  once a week 5 each 1   No current facility-administered medications for this visit.    ROS   PHYSICAL EXAMINATION: There were no vitals taken for this visit. Physical Exam  Diagnostic Studies & Laboratory data:    CT Scan:    EGD: 12/23 Large hiatal hernia.  Multiple Camerons ulcers Manometry: *** Path: ***      I have independently reviewed the above radiology studies  and reviewed the findings with the patient.   Recent Lab Findings: Lab Results  Component Value Date   WBC 5.3 11/05/2022   HGB 8.3 (L) 11/05/2022   HCT 36.3 01/06/2023   PLT 352 11/05/2022   GLUCOSE 104 (H) 01/06/2023   CHOL 193 01/06/2023   TRIG 120 01/06/2023   HDL 83 01/06/2023   LDLCALC 89 01/06/2023   ALT 16 01/06/2023   AST 17 01/06/2023   NA 147 (H) 01/06/2023   K 3.9 01/06/2023   CL 107 (H) 01/06/2023   CREATININE 0.73 01/06/2023   BUN 12 01/06/2023   CO2 22 01/06/2023   TSH 1.350 01/06/2023   INR 0.95 02/13/2010   HGBA1C 5.5 01/06/2023       Assessment / Plan:   ***     I  spent {CHL ONC TIME VISIT - SWIFT:1154000130} with  the patient face to face and greater then 50% of the time was spent in counseling and   coordination of care.    Tilton Marsalis O Brennan Karam 02/25/2023 8:45 AM        

## 2023-02-25 NOTE — Progress Notes (Unsigned)
[address]       [place] [address]             [phone]                    Dana Williams [facility] Medical Record #[id number] Date of Birth: 21-Jun-1961  Referring: Moshe Cipro, NP Primary Care: Moshe Cipro, NP Primary Cardiologist: None  Chief Complaint:   No chief complaint on file.   History of Present Illness:    Dana Williams 62 y.o. female ***  Hx of recurrent anemia.  GERD symptoms: *** Dysphagia: *** Odynophagia: *** Voice changes: *** Respiratory symptoms: *** Weight changes: *** Hx of Barrett's Esphagus: ***    Past Medical History:  Diagnosis Date   Anemia    Arthritis    Arthritis    Arthritis of pelvic region    Depression    Dry skin    Erythema multiforme    Fatigue    GERD (gastroesophageal reflux disease)    H/O psoriatic arthritis    DX  age 32---- in remission for 20 years   History of hiatal hernia    History of kidney stones    Hyperlipidemia    Joint pain    Kidney stones    Obesity    Personal history of colonic polyp - adenoma 01/29/2014   Plantar fasciitis of left foot    PONV (postoperative nausea and vomiting)    Postmenopausal    Psoriasis    Renal calculus, left    Sigmoid diverticulosis    Urticaria    Vitamin D deficiency     Past Surgical History:  Procedure Laterality Date   CLOSED MANIPULATION SHOULDER     COLONOSCOPY WITH PROPOFOL  01-25-2014   CYSTO/  BILATERAL RETROGRADE PYELOGRAM/  BILATERAL URETEROSCOPIC LASER LITHOTRIPSY STONE EXTRACTIONS/  BILATERAL STENT PLACEMENT  02-13-2010   CYSTOSCOPY WITH RETROGRADE PYELOGRAM, URETEROSCOPY AND STENT PLACEMENT Left 04/21/2015   Procedure: CYSTOSCOPY WITH RETROGRADE PYELOGRAM, URETEROSCOPY, STONE EXTRACTION AND STENT PLACEMENT;  Surgeon: Marcine Matar, MD;  Location: [facility];  Service: Urology;  Laterality: Left;   CYSTOURETHROSCOPY/  PLACEMENT LYNX SUPRAPUBIC SLING  05-24-2008   HAND TENDON SURGERY  Right 1993   HOLMIUM LASER APPLICATION Left 04/21/2015   Procedure: HOLMIUM LASER APPLICATION;  Surgeon: Marcine Matar, MD;  Location: [facility];  Service: Urology;  Laterality: Left;   LAPAROSCOPIC CHOLECYSTECTOMY  01-31-2002   LEFT URETEROSCOPIC STONE EXTRACTION/  STENT PLACEMENT  10-06-2005   RHINOPLASTY  2007   RHINOPLASTY     STRABISMUS SURGERY Left age 62   TOTAL KNEE ARTHROPLASTY Left 10/27/2016   Procedure: TOTAL KNEE ARTHROPLASTY;  Surgeon: Loreta Ave, MD;  Location: [facility];  Service: Orthopedics;  Laterality: Left;   WISDOM TOOTH EXTRACTION  age 37    Family History  Problem Relation Age of Onset   Stomach cancer Paternal Grandmother    Hypertension Mother    Hyperlipidemia Mother    Thyroid disease Mother    Obesity Mother    Colon cancer Neg Hx    Esophageal cancer Neg Hx    Rectal cancer Neg Hx    Allergic rhinitis Neg Hx    Asthma Neg Hx    Eczema Neg Hx    Urticaria Neg Hx      Social History   Tobacco Use  Smoking Status Never  Smokeless Tobacco Never    Social History   Substance  and Sexual Activity  Alcohol Use No     Allergies  Allergen Reactions   Sulfa Antibiotics Other (See Comments)    Mouth ulcers    Current Outpatient Medications  Medication Sig Dispense Refill   ammonium lactate (LAC-HYDRIN) 12 % lotion Apply on the skin twice a day 226 g 2   ammonium lactate (LAC-HYDRIN) 12 % lotion Apply 1 application on the skin twice a day 226 g 2   Azelaic Acid (FINACEA) 15 % gel Apply to affected area daily 50 g 1   calcipotriene-betamethasone (TACLONEX) external suspension Apply to affected area daily (Patient taking differently: Apply 1 application  topically daily as needed (psoriasis).) 60 g 2   celecoxib (CELEBREX) 200 MG capsule Take 1 capsule (200 mg total) by mouth daily with food for pain and swelling. 30 capsule 3   cyanocobalamin (VITAMIN B12) 1000 MCG/ML injection Inject 1 mL (1,000 mcg total) into the muscle  every 30 (thirty) days. 3 mL 0   ergocalciferol (VITAMIN D2) 1.25 MG (50000 UT) capsule Take 1 capsule by mouth once a week 8 capsule 0   esomeprazole (NEXIUM) 40 MG capsule Take 1 capsule (40 mg total) by mouth daily. 90 capsule 1   ferrous sulfate 325 (65 FE) MG EC tablet Take 1 tablet (325 mg total) by mouth 2 (two) times daily. (Patient taking differently: Take 325 mg by mouth daily.) 60 tablet 3   hydrOXYzine (ATARAX) 10 MG tablet Take 2 tablets by mouth at bedtime 180 tablet 0   NEEDLE, DISP, 23 G (BD DISP NEEDLE) 23G X 1" MISC Use to adminster the B-12 into the muscle once a week 5 each 1   rosuvastatin (CRESTOR) 10 MG tablet Take 1 tablet by mouth once daily 90 tablet 1   rosuvastatin (CRESTOR) 10 MG tablet Take 1 tablet (10 mg total) by mouth daily. 90 tablet 1   sertraline (ZOLOFT) 100 MG tablet Take 1 tablet (100 mg total) by mouth daily. 90 tablet 1   SYRINGE-NEEDLE, DISP, 3 ML 18G X 1-1/2" 3 ML MISC to draw up cyanocobalamin to inject  once a week 5 each 1   No current facility-administered medications for this visit.    ROS   PHYSICAL EXAMINATION: There were no vitals taken for this visit. Physical Exam  Diagnostic Studies & Laboratory data:    CT Scan:    EGD: 12/23 Large hiatal hernia.  Multiple Camerons ulcers Manometry: *** Path: ***      I have independently reviewed the above radiology studies  and reviewed the findings with the patient.   Recent Lab Findings: Lab Results  Component Value Date   WBC 5.3 11/05/2022   HGB 8.3 (L) 11/05/2022   HCT 36.3 01/06/2023   PLT 352 11/05/2022   GLUCOSE 104 (H) 01/06/2023   CHOL 193 01/06/2023   TRIG 120 01/06/2023   HDL 83 01/06/2023   LDLCALC 89 01/06/2023   ALT 16 01/06/2023   AST 17 01/06/2023   NA 147 (H) 01/06/2023   K 3.9 01/06/2023   CL 107 (H) 01/06/2023   CREATININE 0.73 01/06/2023   BUN 12 01/06/2023   CO2 22 01/06/2023   TSH 1.350 01/06/2023   INR 0.95 02/13/2010   HGBA1C 5.5 01/06/2023       Assessment / Plan:   ***     I  spent {CHL ONC TIME VISIT - ZOXWR:[id number]} with  the patient face to face and greater then 50% of the time was spent in counseling and  coordination of care.    Corliss Skains 02/25/2023 8:45 AM

## 2023-03-04 ENCOUNTER — Other Ambulatory Visit: Payer: Self-pay

## 2023-03-04 DIAGNOSIS — D500 Iron deficiency anemia secondary to blood loss (chronic): Secondary | ICD-10-CM | POA: Diagnosis not present

## 2023-03-04 DIAGNOSIS — R030 Elevated blood-pressure reading, without diagnosis of hypertension: Secondary | ICD-10-CM | POA: Diagnosis not present

## 2023-03-04 DIAGNOSIS — Z6839 Body mass index (BMI) 39.0-39.9, adult: Secondary | ICD-10-CM | POA: Diagnosis not present

## 2023-03-04 DIAGNOSIS — D509 Iron deficiency anemia, unspecified: Secondary | ICD-10-CM

## 2023-03-07 ENCOUNTER — Other Ambulatory Visit: Payer: Self-pay

## 2023-03-07 ENCOUNTER — Inpatient Hospital Stay: Payer: Commercial Managed Care - PPO | Admitting: Internal Medicine

## 2023-03-07 ENCOUNTER — Telehealth: Payer: Self-pay | Admitting: Medical Oncology

## 2023-03-07 ENCOUNTER — Inpatient Hospital Stay: Payer: Commercial Managed Care - PPO | Attending: Oncology

## 2023-03-07 ENCOUNTER — Other Ambulatory Visit (HOSPITAL_COMMUNITY): Payer: Self-pay

## 2023-03-07 NOTE — Telephone Encounter (Signed)
Pt did not show up for appts today. No voice mail after 5 rings.

## 2023-03-08 ENCOUNTER — Other Ambulatory Visit (HOSPITAL_COMMUNITY): Payer: Self-pay

## 2023-03-10 ENCOUNTER — Other Ambulatory Visit (HOSPITAL_COMMUNITY): Payer: Self-pay

## 2023-03-10 ENCOUNTER — Encounter (INDEPENDENT_AMBULATORY_CARE_PROVIDER_SITE_OTHER): Payer: Self-pay | Admitting: Family Medicine

## 2023-03-11 ENCOUNTER — Other Ambulatory Visit (HOSPITAL_COMMUNITY): Payer: Self-pay

## 2023-03-11 MED ORDER — CLONIDINE HCL 0.1 MG PO TABS
0.1000 mg | ORAL_TABLET | Freq: Every day | ORAL | 1 refills | Status: DC
  Filled 2023-03-11: qty 30, 30d supply, fill #0

## 2023-03-11 NOTE — Pre-Procedure Instructions (Signed)
Surgical Instructions    Your procedure is scheduled on Mar 16, 2023.  Report to [facility] Main Entrance "A" at 6:30 A.M., then check in with the Admitting office.  Call this number if you have problems the morning of surgery:  [phone]  If you have any questions prior to your surgery date call [phone]: Open Monday-Friday 8am-4pm If you experience any cold or flu symptoms such as cough, fever, chills, shortness of breath, etc. between now and your scheduled surgery, please notify us at the above number.     Remember:  Do not eat or drink after midnight the night before your surgery     Take these medicines the morning of surgery with A SIP OF WATER:  esomeprazole (NEXIUM)   rosuvastatin (CRESTOR)   sertraline (ZOLOFT)    As of today, STOP taking any Aspirin (unless otherwise instructed by your surgeon) Aleve, Naproxen, Ibuprofen, Motrin, Advil, Goody's, BC's, all herbal medications, fish oil, and all vitamins.                     Do NOT Smoke (Tobacco/Vaping) for 24 hours prior to your procedure.  If you use a CPAP at night, you may bring your mask/headgear for your overnight stay.   Contacts, glasses, piercing's, hearing aid's, dentures or partials may not be worn into surgery, please bring cases for these belongings.    For patients admitted to the hospital, discharge time will be determined by your treatment team.   Patients discharged the day of surgery will not be allowed to drive home, and someone needs to stay with them for 24 hours.  SURGICAL WAITING ROOM VISITATION Patients having surgery or a procedure may have no more than 2 support people in the waiting area - these visitors may rotate.   Children under the age of 54 must have an adult with them who is not the patient. If the patient needs to stay at the hospital during part of their recovery, the visitor guidelines for inpatient rooms apply. Pre-op nurse will coordinate an appropriate time for 1 support  person to accompany patient in pre-op.  This support person may not rotate.   Please refer to the [facility] website for the visitor guidelines for Inpatients (after your surgery is over and you are in a regular room).    Special instructions:   [facility]- Preparing For Surgery  Before surgery, you can play an important role. Because skin is not sterile, your skin needs to be as free of germs as possible. You can reduce the number of germs on your skin by washing with CHG (chlorahexidine gluconate) Soap before surgery.  CHG is an antiseptic cleaner which kills germs and bonds with the skin to continue killing germs even after washing.    Oral Hygiene is also important to reduce your risk of infection.  Remember - BRUSH YOUR TEETH THE MORNING OF SURGERY WITH YOUR REGULAR TOOTHPASTE  Please do not use if you have an allergy to CHG or antibacterial soaps. If your skin becomes reddened/irritated stop using the CHG.  Do not shave (including legs and underarms) for at least 48 hours prior to first CHG shower. It is OK to shave your face.  Please follow these instructions carefully.   Shower the NIGHT BEFORE SURGERY and the MORNING OF SURGERY  If you chose to wash your hair, wash your hair first as usual with your normal shampoo.  After you shampoo, rinse your hair and body thoroughly to remove  the shampoo.  Use CHG Soap as you would any other liquid soap. You can apply CHG directly to the skin and wash gently with a scrungie or a clean washcloth.   Apply the CHG Soap to your body ONLY FROM THE NECK DOWN.  Do not use on open wounds or open sores. Avoid contact with your eyes, ears, mouth and genitals (private parts). Wash Face and genitals (private parts)  with your normal soap.   Wash thoroughly, paying special attention to the area where your surgery will be performed.  Thoroughly rinse your body with warm water from the neck down.  DO NOT shower/wash with your normal soap after using and  rinsing off the CHG Soap.  Pat yourself dry with a CLEAN TOWEL.  Wear CLEAN PAJAMAS to bed the night before surgery  Place CLEAN SHEETS on your bed the night before your surgery  DO NOT SLEEP WITH PETS.   Day of Surgery: Take a shower with CHG soap. Do not wear jewelry or makeup Do not wear lotions, powders, perfumes/colognes, or deodorant. Do not shave 48 hours prior to surgery.  Men may shave face and neck. Do not bring valuables to the hospital.  [facility] is not responsible for any belongings or valuables. Do not wear nail polish, gel polish, artificial nails, or any other type of covering on natural nails (fingers and toes) If you have artificial nails or gel coating that need to be removed by a nail salon, please have this removed prior to surgery. Artificial nails or gel coating may interfere with anesthesia's ability to adequately monitor your vital signs.  Wear Clean/Comfortable clothing the morning of surgery Remember to brush your teeth WITH YOUR REGULAR TOOTHPASTE.   Please read over the following fact sheets that you were given.    If you received a COVID test during your pre-op visit  it is requested that you wear a mask when out in public, stay away from anyone that may not be feeling well and notify your surgeon if you develop symptoms. If you have been in contact with anyone that has tested positive in the last 10 days please notify you surgeon.

## 2023-03-12 ENCOUNTER — Other Ambulatory Visit (HOSPITAL_COMMUNITY): Payer: Self-pay

## 2023-03-14 ENCOUNTER — Encounter (HOSPITAL_COMMUNITY)
Admission: RE | Admit: 2023-03-14 | Discharge: 2023-03-14 | Disposition: A | Discharge status: Home / Self Care | Payer: Commercial Managed Care - PPO | Source: Ambulatory Visit | Attending: Thoracic Surgery (Cardiothoracic Vascular Surgery) | Admitting: Thoracic Surgery (Cardiothoracic Vascular Surgery)

## 2023-03-14 ENCOUNTER — Ambulatory Visit (HOSPITAL_COMMUNITY)
Admission: RE | Admit: 2023-03-14 | Discharge: 2023-03-14 | Disposition: A | Discharge status: Home / Self Care | Payer: Commercial Managed Care - PPO | Source: Ambulatory Visit | Attending: Thoracic Surgery (Cardiothoracic Vascular Surgery) | Admitting: Thoracic Surgery (Cardiothoracic Vascular Surgery)

## 2023-03-14 ENCOUNTER — Encounter (HOSPITAL_COMMUNITY): Payer: Self-pay

## 2023-03-14 ENCOUNTER — Other Ambulatory Visit: Payer: Self-pay

## 2023-03-14 VITALS — BP 144/80 | HR 97 | Temp 98.2°F | Resp 18 | Ht 65.0 in | Wt 244.0 lb

## 2023-03-14 DIAGNOSIS — D500 Iron deficiency anemia secondary to blood loss (chronic): Secondary | ICD-10-CM | POA: Diagnosis not present

## 2023-03-14 DIAGNOSIS — Z1152 Encounter for screening for COVID-19: Secondary | ICD-10-CM | POA: Insufficient documentation

## 2023-03-14 DIAGNOSIS — K259 Gastric ulcer, unspecified as acute or chronic, without hemorrhage or perforation: Secondary | ICD-10-CM | POA: Diagnosis not present

## 2023-03-14 DIAGNOSIS — Z01818 Encounter for other preprocedural examination: Secondary | ICD-10-CM

## 2023-03-14 DIAGNOSIS — K449 Diaphragmatic hernia without obstruction or gangrene: Secondary | ICD-10-CM

## 2023-03-14 DIAGNOSIS — E669 Obesity, unspecified: Secondary | ICD-10-CM | POA: Diagnosis not present

## 2023-03-14 DIAGNOSIS — Z96652 Presence of left artificial knee joint: Secondary | ICD-10-CM | POA: Diagnosis not present

## 2023-03-14 LAB — SARS CORONAVIRUS 2 BY RT PCR: SARS Coronavirus 2 by RT PCR: NEGATIVE

## 2023-03-14 LAB — URINALYSIS, ROUTINE W REFLEX MICROSCOPIC
Bilirubin Urine: NEGATIVE
Glucose, UA: NEGATIVE mg/dL
Hgb urine dipstick: NEGATIVE
Ketones, ur: NEGATIVE mg/dL
Nitrite: NEGATIVE
Protein, ur: NEGATIVE mg/dL
Specific Gravity, Urine: 1.011 (ref 1.005–1.030)
pH: 6 (ref 5.0–8.0)

## 2023-03-14 LAB — APTT: aPTT: 26 seconds (ref 24–36)

## 2023-03-14 LAB — CBC
HCT: 34.7 % — ABNORMAL LOW (ref 36.0–46.0)
Hemoglobin: 10.1 g/dL — ABNORMAL LOW (ref 12.0–15.0)
MCH: 23.4 pg — ABNORMAL LOW (ref 26.0–34.0)
MCHC: 29.1 g/dL — ABNORMAL LOW (ref 30.0–36.0)
MCV: 80.5 fL (ref 80.0–100.0)
Platelets: 310 10*3/uL (ref 150–400)
RBC: 4.31 MIL/uL (ref 3.87–5.11)
RDW: 15.1 % (ref 11.5–15.5)
WBC: 5.9 10*3/uL (ref 4.0–10.5)
nRBC: 0 % (ref 0.0–0.2)

## 2023-03-14 LAB — COMPREHENSIVE METABOLIC PANEL
ALT: 22 U/L (ref 0–44)
AST: 22 U/L (ref 15–41)
Albumin: 3.5 g/dL (ref 3.5–5.0)
Alkaline Phosphatase: 136 U/L — ABNORMAL HIGH (ref 38–126)
Anion gap: 8 (ref 5–15)
BUN: 10 mg/dL (ref 8–23)
CO2: 27 mmol/L (ref 22–32)
Calcium: 9.4 mg/dL (ref 8.9–10.3)
Chloride: 105 mmol/L (ref 98–111)
Creatinine, Ser: 0.86 mg/dL (ref 0.44–1.00)
GFR, Estimated: >60 mL/min (ref >60)
Glucose, Bld: 103 mg/dL — ABNORMAL HIGH (ref 70–99)
Potassium: 3.3 mmol/L — ABNORMAL LOW (ref 3.5–5.1)
Sodium: 140 mmol/L (ref 135–145)
Total Bilirubin: 0.4 mg/dL (ref 0.3–1.2)
Total Protein: 6.8 g/dL (ref 6.5–8.1)

## 2023-03-14 LAB — TYPE AND SCREEN
ABO/RH(D): A POS
Antibody Screen: NEGATIVE

## 2023-03-14 LAB — PROTIME-INR
INR: 1 (ref 0.8–1.2)
Prothrombin Time: 13.3 seconds (ref 11.4–15.2)

## 2023-03-14 LAB — SURGICAL PCR SCREEN
MRSA, PCR: NEGATIVE
Staphylococcus aureus: NEGATIVE

## 2023-03-14 NOTE — Progress Notes (Signed)
PCP - Caren Dalbert Garnet Cardiologist - [place] Gastroenterologist: Stan Head Oncologist: Clelia Croft but he left the practice  PPM/ICD - denies   Chest x-ray - 03/14/23 EKG - 03/14/23 Stress Test - denies ECHO - denies Cardiac Cath - denies  Sleep Study - no   ERAS Protcol -no   COVID TEST- yes, in PAT   Anesthesia review: yes, review hematology notes and labs   Patient denies shortness of breath, fever, cough and chest pain at PAT appointment   All instructions explained to the patient, with a verbal understanding of the material. Patient agrees to go over the instructions while at home for a better understanding. Patient also instructed to self quarantine after being tested for COVID-19. The opportunity to ask questions was provided.

## 2023-03-16 ENCOUNTER — Observation Stay (HOSPITAL_COMMUNITY)
Admission: RE | Admit: 2023-03-16 | Discharge: 2023-03-17 | Disposition: A | Discharge status: Home / Self Care | Payer: Commercial Managed Care - PPO | Attending: Thoracic Surgery (Cardiothoracic Vascular Surgery) | Admitting: Thoracic Surgery (Cardiothoracic Vascular Surgery)

## 2023-03-16 ENCOUNTER — Inpatient Hospital Stay (HOSPITAL_COMMUNITY): Payer: Commercial Managed Care - PPO | Admitting: Physician Assistant

## 2023-03-16 ENCOUNTER — Other Ambulatory Visit: Payer: Self-pay

## 2023-03-16 ENCOUNTER — Encounter (HOSPITAL_COMMUNITY): Payer: Self-pay | Admitting: Thoracic Surgery (Cardiothoracic Vascular Surgery)

## 2023-03-16 ENCOUNTER — Inpatient Hospital Stay (HOSPITAL_COMMUNITY): Payer: Commercial Managed Care - PPO

## 2023-03-16 ENCOUNTER — Encounter (HOSPITAL_COMMUNITY)
Admission: RE | Disposition: A | Discharge status: Home / Self Care | Payer: Self-pay | Source: Home / Self Care | Attending: Thoracic Surgery (Cardiothoracic Vascular Surgery)

## 2023-03-16 ENCOUNTER — Inpatient Hospital Stay (HOSPITAL_COMMUNITY): Payer: Commercial Managed Care - PPO | Admitting: Certified Registered"

## 2023-03-16 DIAGNOSIS — K449 Diaphragmatic hernia without obstruction or gangrene: Principal | ICD-10-CM

## 2023-03-16 DIAGNOSIS — Z800 Family history of malignant neoplasm of digestive organs: Secondary | ICD-10-CM

## 2023-03-16 DIAGNOSIS — E785 Hyperlipidemia, unspecified: Secondary | ICD-10-CM | POA: Diagnosis present

## 2023-03-16 DIAGNOSIS — D649 Anemia, unspecified: Secondary | ICD-10-CM

## 2023-03-16 DIAGNOSIS — Z9049 Acquired absence of other specified parts of digestive tract: Secondary | ICD-10-CM | POA: Diagnosis not present

## 2023-03-16 DIAGNOSIS — J939 Pneumothorax, unspecified: Secondary | ICD-10-CM | POA: Diagnosis not present

## 2023-03-16 DIAGNOSIS — L4050 Arthropathic psoriasis, unspecified: Secondary | ICD-10-CM | POA: Diagnosis present

## 2023-03-16 DIAGNOSIS — Z87442 Personal history of urinary calculi: Secondary | ICD-10-CM | POA: Diagnosis not present

## 2023-03-16 DIAGNOSIS — Z83438 Family history of other disorder of lipoprotein metabolism and other lipidemia: Secondary | ICD-10-CM | POA: Diagnosis not present

## 2023-03-16 DIAGNOSIS — E669 Obesity, unspecified: Secondary | ICD-10-CM | POA: Diagnosis present

## 2023-03-16 DIAGNOSIS — Z8711 Personal history of peptic ulcer disease: Secondary | ICD-10-CM

## 2023-03-16 DIAGNOSIS — K254 Chronic or unspecified gastric ulcer with hemorrhage: Secondary | ICD-10-CM | POA: Diagnosis present

## 2023-03-16 DIAGNOSIS — Z8249 Family history of ischemic heart disease and other diseases of the circulatory system: Secondary | ICD-10-CM | POA: Diagnosis not present

## 2023-03-16 DIAGNOSIS — E559 Vitamin D deficiency, unspecified: Secondary | ICD-10-CM | POA: Diagnosis present

## 2023-03-16 DIAGNOSIS — K259 Gastric ulcer, unspecified as acute or chronic, without hemorrhage or perforation: Secondary | ICD-10-CM | POA: Diagnosis present

## 2023-03-16 DIAGNOSIS — K219 Gastro-esophageal reflux disease without esophagitis: Secondary | ICD-10-CM | POA: Diagnosis present

## 2023-03-16 DIAGNOSIS — Z8719 Personal history of other diseases of the digestive system: Secondary | ICD-10-CM | POA: Diagnosis not present

## 2023-03-16 DIAGNOSIS — Z79899 Other long term (current) drug therapy: Secondary | ICD-10-CM | POA: Diagnosis not present

## 2023-03-16 DIAGNOSIS — Z96652 Presence of left artificial knee joint: Secondary | ICD-10-CM | POA: Diagnosis present

## 2023-03-16 DIAGNOSIS — Z6841 Body Mass Index (BMI) 40.0 and over, adult: Secondary | ICD-10-CM

## 2023-03-16 DIAGNOSIS — R1310 Dysphagia, unspecified: Secondary | ICD-10-CM | POA: Diagnosis present

## 2023-03-16 DIAGNOSIS — D500 Iron deficiency anemia secondary to blood loss (chronic): Secondary | ICD-10-CM | POA: Insufficient documentation

## 2023-03-16 DIAGNOSIS — Z1152 Encounter for screening for COVID-19: Secondary | ICD-10-CM | POA: Insufficient documentation

## 2023-03-16 DIAGNOSIS — Z8349 Family history of other endocrine, nutritional and metabolic diseases: Secondary | ICD-10-CM

## 2023-03-16 HISTORY — PX: ESOPHAGOGASTRODUODENOSCOPY: SHX5428

## 2023-03-16 HISTORY — PX: XI ROBOTIC ASSISTED PARAESOPHAGEAL HERNIA REPAIR: SHX6871

## 2023-03-16 SURGERY — REPAIR, HERNIA, PARAESOPHAGEAL, ROBOT-ASSISTED
Anesthesia: General | Site: Chest

## 2023-03-16 MED ORDER — DEXAMETHASONE SODIUM PHOSPHATE 10 MG/ML IJ SOLN
INTRAMUSCULAR | Status: AC
  Filled 2023-03-16: qty 1

## 2023-03-16 MED ORDER — AMISULPRIDE (ANTIEMETIC) 5 MG/2ML IV SOLN: 10.0000 mg | Freq: Once | INTRAVENOUS | Status: DC | PRN

## 2023-03-16 MED ORDER — ORAL CARE MOUTH RINSE: 15.0000 mL | Freq: Once | OROMUCOSAL | Status: AC

## 2023-03-16 MED ORDER — PHENYLEPHRINE 80 MCG/ML (10ML) SYRINGE FOR IV PUSH (FOR BLOOD PRESSURE SUPPORT)
PREFILLED_SYRINGE | INTRAVENOUS | Status: DC | PRN
  Administered 2023-03-16: 80 ug via INTRAVENOUS
  Administered 2023-03-16: 40 ug via INTRAVENOUS

## 2023-03-16 MED ORDER — PHENYLEPHRINE 80 MCG/ML (10ML) SYRINGE FOR IV PUSH (FOR BLOOD PRESSURE SUPPORT)
PREFILLED_SYRINGE | INTRAVENOUS | Status: AC
  Filled 2023-03-16: qty 10

## 2023-03-16 MED ORDER — ROCURONIUM BROMIDE 10 MG/ML (PF) SYRINGE
PREFILLED_SYRINGE | INTRAVENOUS | Status: DC | PRN
  Administered 2023-03-16 (×6): 20 mg via INTRAVENOUS

## 2023-03-16 MED ORDER — FENTANYL CITRATE (PF) 100 MCG/2ML IJ SOLN
INTRAMUSCULAR | Status: AC
  Filled 2023-03-16: qty 2

## 2023-03-16 MED ORDER — MORPHINE SULFATE (PF) 2 MG/ML IV SOLN: 1.0000 mg | INTRAVENOUS | Status: DC | PRN

## 2023-03-16 MED ORDER — 0.9 % SODIUM CHLORIDE (POUR BTL) OPTIME
TOPICAL | Status: DC | PRN
  Administered 2023-03-16: 2000 mL

## 2023-03-16 MED ORDER — BUPIVACAINE HCL (PF) 0.5 % IJ SOLN
INTRAMUSCULAR | Status: AC
  Filled 2023-03-16: qty 30

## 2023-03-16 MED ORDER — PROPOFOL 10 MG/ML IV BOLUS
INTRAVENOUS | Status: AC
  Filled 2023-03-16: qty 20

## 2023-03-16 MED ORDER — ORAL CARE MOUTH RINSE: 15.0000 mL | OROMUCOSAL | Status: DC | PRN

## 2023-03-16 MED ORDER — SUCCINYLCHOLINE CHLORIDE 200 MG/10ML IV SOSY
PREFILLED_SYRINGE | INTRAVENOUS | Status: DC | PRN
  Administered 2023-03-16: 120 mg via INTRAVENOUS

## 2023-03-16 MED ORDER — LIDOCAINE 2% (20 MG/ML) 5 ML SYRINGE
INTRAMUSCULAR | Status: DC | PRN
  Administered 2023-03-16: 60 mg via INTRAVENOUS

## 2023-03-16 MED ORDER — ROCURONIUM BROMIDE 10 MG/ML (PF) SYRINGE
PREFILLED_SYRINGE | INTRAVENOUS | Status: AC
  Filled 2023-03-16: qty 10

## 2023-03-16 MED ORDER — SCOPOLAMINE 1 MG/3DAYS TD PT72
MEDICATED_PATCH | TRANSDERMAL | Status: AC
  Filled 2023-03-16: qty 1

## 2023-03-16 MED ORDER — FENTANYL CITRATE (PF) 100 MCG/2ML IJ SOLN
25.0000 ug | INTRAMUSCULAR | Status: DC | PRN
  Administered 2023-03-16: 50 ug via INTRAVENOUS

## 2023-03-16 MED ORDER — FENTANYL CITRATE (PF) 250 MCG/5ML IJ SOLN
INTRAMUSCULAR | Status: AC
  Filled 2023-03-16: qty 5

## 2023-03-16 MED ORDER — KETOROLAC TROMETHAMINE 15 MG/ML IJ SOLN
INTRAMUSCULAR | Status: AC
  Filled 2023-03-16: qty 1

## 2023-03-16 MED ORDER — LACTATED RINGERS IV SOLN: INTRAVENOUS | Status: DC

## 2023-03-16 MED ORDER — CHLORHEXIDINE GLUCONATE 0.12 % MT SOLN
15.0000 mL | Freq: Once | OROMUCOSAL | Status: AC
  Administered 2023-03-16: 15 mL via OROMUCOSAL
  Filled 2023-03-16: qty 15

## 2023-03-16 MED ORDER — PROPOFOL 10 MG/ML IV BOLUS
INTRAVENOUS | Status: DC | PRN
  Administered 2023-03-16: 200 mg via INTRAVENOUS

## 2023-03-16 MED ORDER — LACTATED RINGERS IV SOLN: INTRAVENOUS | Status: DC | PRN

## 2023-03-16 MED ORDER — KETOROLAC TROMETHAMINE 15 MG/ML IJ SOLN
15.0000 mg | Freq: Four times a day (QID) | INTRAMUSCULAR | Status: DC
  Administered 2023-03-16 – 2023-03-17 (×5): 15 mg via INTRAVENOUS
  Filled 2023-03-16 (×4): qty 1

## 2023-03-16 MED ORDER — BUPIVACAINE LIPOSOME 1.3 % IJ SUSP: INTRAMUSCULAR | Status: DC | PRN

## 2023-03-16 MED ORDER — EPHEDRINE 5 MG/ML INJ
INTRAVENOUS | Status: AC
  Filled 2023-03-16: qty 5

## 2023-03-16 MED ORDER — ONDANSETRON HCL 4 MG/2ML IJ SOLN
INTRAMUSCULAR | Status: AC
  Filled 2023-03-16: qty 2

## 2023-03-16 MED ORDER — CEFAZOLIN SODIUM-DEXTROSE 2-4 GM/100ML-% IV SOLN
2.0000 g | Freq: Three times a day (TID) | INTRAVENOUS | Status: AC
  Administered 2023-03-16 (×2): 2 g via INTRAVENOUS
  Filled 2023-03-16 (×2): qty 100

## 2023-03-16 MED ORDER — FENTANYL CITRATE (PF) 250 MCG/5ML IJ SOLN
INTRAMUSCULAR | Status: DC | PRN
  Administered 2023-03-16 (×2): 100 ug via INTRAVENOUS
  Administered 2023-03-16: 50 ug via INTRAVENOUS

## 2023-03-16 MED ORDER — ONDANSETRON HCL 4 MG/2ML IJ SOLN: 4.0000 mg | Freq: Four times a day (QID) | INTRAMUSCULAR | Status: DC | PRN

## 2023-03-16 MED ORDER — ONDANSETRON HCL 4 MG/2ML IJ SOLN
INTRAMUSCULAR | Status: DC | PRN
  Administered 2023-03-16: 4 mg via INTRAVENOUS

## 2023-03-16 MED ORDER — PHENYLEPHRINE HCL-NACL 20-0.9 MG/250ML-% IV SOLN
INTRAVENOUS | Status: DC | PRN
  Administered 2023-03-16: 20 ug/min via INTRAVENOUS

## 2023-03-16 MED ORDER — KETAMINE HCL 50 MG/5ML IJ SOSY
PREFILLED_SYRINGE | INTRAMUSCULAR | Status: AC
  Filled 2023-03-16: qty 5

## 2023-03-16 MED ORDER — SUGAMMADEX SODIUM 200 MG/2ML IV SOLN
INTRAVENOUS | Status: DC | PRN
  Administered 2023-03-16: 400 mg via INTRAVENOUS

## 2023-03-16 MED ORDER — MIDAZOLAM HCL 2 MG/2ML IJ SOLN
INTRAMUSCULAR | Status: DC | PRN
  Administered 2023-03-16: 2 mg via INTRAVENOUS

## 2023-03-16 MED ORDER — ACETAMINOPHEN 500 MG PO TABS
1000.0000 mg | ORAL_TABLET | Freq: Once | ORAL | Status: AC
  Administered 2023-03-16: 1000 mg via ORAL
  Filled 2023-03-16: qty 2

## 2023-03-16 MED ORDER — SCOPOLAMINE 1 MG/3DAYS TD PT72
1.0000 | MEDICATED_PATCH | TRANSDERMAL | Status: DC
  Administered 2023-03-16: 1.5 mg via TRANSDERMAL
  Filled 2023-03-16: qty 1

## 2023-03-16 MED ORDER — CEFAZOLIN SODIUM-DEXTROSE 2-4 GM/100ML-% IV SOLN
2.0000 g | INTRAVENOUS | Status: AC
  Administered 2023-03-16: 2 g via INTRAVENOUS
  Filled 2023-03-16: qty 100

## 2023-03-16 MED ORDER — MIDAZOLAM HCL 2 MG/2ML IJ SOLN
INTRAMUSCULAR | Status: AC
  Filled 2023-03-16: qty 2

## 2023-03-16 MED ORDER — KETAMINE HCL 10 MG/ML IJ SOLN
INTRAMUSCULAR | Status: DC | PRN
  Administered 2023-03-16: 30 mg via INTRAVENOUS

## 2023-03-16 MED ORDER — HEMOSTATIC AGENTS (NO CHARGE) OPTIME
TOPICAL | Status: DC | PRN
  Administered 2023-03-16: 1 via TOPICAL

## 2023-03-16 MED ORDER — BUPIVACAINE LIPOSOME 1.3 % IJ SUSP
INTRAMUSCULAR | Status: AC
  Filled 2023-03-16: qty 20

## 2023-03-16 MED ORDER — LIDOCAINE 2% (20 MG/ML) 5 ML SYRINGE
INTRAMUSCULAR | Status: AC
  Filled 2023-03-16: qty 5

## 2023-03-16 MED ORDER — LABETALOL HCL 5 MG/ML IV SOLN
INTRAVENOUS | Status: AC
  Filled 2023-03-16: qty 4

## 2023-03-16 MED ORDER — DEXAMETHASONE SODIUM PHOSPHATE 10 MG/ML IJ SOLN
INTRAMUSCULAR | Status: DC | PRN
  Administered 2023-03-16: 8 mg via INTRAVENOUS

## 2023-03-16 MED ORDER — PROPOFOL 500 MG/50ML IV EMUL
INTRAVENOUS | Status: DC | PRN
  Administered 2023-03-16: 25 ug/kg/min via INTRAVENOUS

## 2023-03-16 SURGICAL SUPPLY — 70 items
ADH SKN CLS APL DERMABOND .7 (GAUZE/BANDAGES/DRESSINGS) ×2
BLADE SURG 11 STRL SS (BLADE) ×3 IMPLANT
BUTTON OLYMPUS DEFENDO 5 PIECE (MISCELLANEOUS) ×3 IMPLANT
CANISTER SUCT 3000ML PPV (MISCELLANEOUS) ×6 IMPLANT
CNTNR URN SCR LID CUP LEK RST (MISCELLANEOUS) ×3 IMPLANT
CONT SPEC 4OZ STRL OR WHT (MISCELLANEOUS) ×2
DEFOGGER SCOPE WARMER CLEARIFY (MISCELLANEOUS) ×3 IMPLANT
DERMABOND ADVANCED .7 DNX12 (GAUZE/BANDAGES/DRESSINGS) ×3 IMPLANT
DEVICE SUTURE ENDOST 10MM (ENDOMECHANICALS) IMPLANT
DRAPE ARM DVNC X/XI (DISPOSABLE) ×12 IMPLANT
DRAPE COLUMN DVNC XI (DISPOSABLE) ×3 IMPLANT
DRAPE CV SPLIT W-CLR ANES SCRN (DRAPES) ×3 IMPLANT
DRAPE INCISE IOBAN 66X45 STRL (DRAPES) IMPLANT
DRAPE ORTHO SPLIT 77X108 STRL (DRAPES) ×2
DRAPE SURG ORHT 6 SPLT 77X108 (DRAPES) ×3 IMPLANT
DRIVER NDL LRG 8 DVNC XI (INSTRUMENTS) IMPLANT
DRIVER NDL MEGA SUTCUT DVNCXI (INSTRUMENTS) IMPLANT
DRIVER NDLE LRG 8 DVNC XI (INSTRUMENTS) ×2 IMPLANT
DRIVER NDLE MEGA SUTCUT DVNCXI (INSTRUMENTS) ×2 IMPLANT
ELECT REM PT RETURN 9FT ADLT (ELECTROSURGICAL) ×2
ELECTRODE REM PT RTRN 9FT ADLT (ELECTROSURGICAL) ×3 IMPLANT
FELT TEFLON 1X6 (MISCELLANEOUS) IMPLANT
FORCEPS BPLR LNG DVNC XI (INSTRUMENTS) IMPLANT
FORCEPS CADIERE DVNC XI (FORCEP) IMPLANT
GAUZE SPONGE 4X4 12PLY STRL (GAUZE/BANDAGES/DRESSINGS) ×3 IMPLANT
GLOVE BIO SURGEON STRL SZ7 (GLOVE) ×3 IMPLANT
GLOVE BIO SURGEON STRL SZ7.5 (GLOVE) ×9 IMPLANT
GOWN STRL REUS W/ TWL LRG LVL3 (GOWN DISPOSABLE) ×3 IMPLANT
GOWN STRL REUS W/ TWL XL LVL3 (GOWN DISPOSABLE) ×6 IMPLANT
GOWN STRL REUS W/TWL 2XL LVL3 (GOWN DISPOSABLE) ×3 IMPLANT
GOWN STRL REUS W/TWL LRG LVL3 (GOWN DISPOSABLE) ×4
GOWN STRL REUS W/TWL XL LVL3 (GOWN DISPOSABLE) ×6
GRASPER SUT TROCAR 14GX15 (MISCELLANEOUS) IMPLANT
GRASPER TIP-UP FEN DVNC XI (INSTRUMENTS) IMPLANT
HEMOSTAT SURGICEL 2X14 (HEMOSTASIS) ×3 IMPLANT
IRRIGATOR SUCT 8 DISP DVNC XI (IRRIGATION / IRRIGATOR) IMPLANT
IV NS 1000ML (IV SOLUTION)
IV NS 1000ML BAXH (IV SOLUTION) IMPLANT
KIT BASIN OR (CUSTOM PROCEDURE TRAY) ×3 IMPLANT
KIT TURNOVER KIT B (KITS) ×3 IMPLANT
MARKER SKIN DUAL TIP RULER LAB (MISCELLANEOUS) ×3 IMPLANT
NEEDLE HYPO 22GX1.5 SAFETY (NEEDLE) ×3 IMPLANT
NS IRRIG 1000ML POUR BTL (IV SOLUTION) ×6 IMPLANT
OIL SILICONE PENTAX (PARTS (SERVICE/REPAIRS)) IMPLANT
PACK CHEST (CUSTOM PROCEDURE TRAY) ×3 IMPLANT
PAD ARMBOARD 7.5X6 YLW CONV (MISCELLANEOUS) ×6 IMPLANT
PORT ACCESS TROCAR AIRSEAL 12 (TROCAR) IMPLANT
SEAL CANN UNIV 5-8 DVNC XI (MISCELLANEOUS) ×12 IMPLANT
SEALER SYNCHRO 8 IS4000 DVNC (MISCELLANEOUS) IMPLANT
SET TRI-LUMEN FLTR TB AIRSEAL (TUBING) ×3 IMPLANT
SUT ETHIBOND 0 36 GRN (SUTURE) ×6 IMPLANT
SUT SILK  1 MH (SUTURE) ×2
SUT SILK 1 MH (SUTURE) ×3 IMPLANT
SUT SURGIDAC NAB ES-9 0 48 120 (SUTURE) IMPLANT
SUT VIC AB 2-0 CT1 18 (SUTURE) ×3 IMPLANT
SUT VIC AB 3-0 SH 27 (SUTURE) ×4
SUT VIC AB 3-0 SH 27X BRD (SUTURE) ×6 IMPLANT
SUT VICRYL 0 UR6 27IN ABS (SUTURE) ×6 IMPLANT
SYR 20ML ECCENTRIC (SYRINGE) ×3 IMPLANT
SYSTEM SAHARA CHEST DRAIN ATS (WOUND CARE) IMPLANT
TOWEL GREEN STERILE (TOWEL DISPOSABLE) ×3 IMPLANT
TOWEL GREEN STERILE FF (TOWEL DISPOSABLE) ×3 IMPLANT
TRAY FOLEY MTR SLVR 16FR STAT (SET/KITS/TRAYS/PACK) ×3 IMPLANT
TROCAR PORT AIRSEAL 8X120 (TROCAR) IMPLANT
TROCAR XCEL BLADELESS 5X75MML (TROCAR) ×3 IMPLANT
TROCAR XCEL NON-BLD 5MMX100MML (ENDOMECHANICALS) IMPLANT
TUBE CONNECTING 20X1/4 (TUBING) ×3 IMPLANT
TUBING ENDO SMARTCAP (MISCELLANEOUS) ×3 IMPLANT
UNDERPAD 30X36 HEAVY ABSORB (UNDERPADS AND DIAPERS) ×3 IMPLANT
WATER STERILE IRR 1000ML POUR (IV SOLUTION) ×3 IMPLANT

## 2023-03-16 NOTE — Discharge Instructions (Signed)
Instructed the patient to crush medications (I.e. Celexa) and take in applesauce. Patient instructed for medications that cannot be crushed (I.e. Toprol XL and Detrol LA), put in applesauce and take one at a time slowly.  Dysphagia Eating Plan, Pureed This eating plan helps people who are not able to bite or chew food. It is also for people who do not have good control of their tongues. Pureed foods are smooth. They are prepared without lumps so that they can be swallowed safely. Work with your health care provider, nutrition and diet specialist (dietitian), or your speech-language pathologist to make sure you are following the eating plan safely and getting all the nutrients you need. What are tips for following this plan? Cooking If a food is not originally a smooth texture, you may be able to eat the food after: Pureeing it. This can be done with a blender. Moistening it. This can be done by adding juice, cooking liquid, gravy, or sauce to a dry food and then pureeing it. For example, you may have bread if you soak it in milk and puree it. If a food is too thin, you may add a commercial thickener, corn starch, rice cereal, or potato flakes to thicken it. Strain and throw away any excess liquid or liquid that separates from a solid pureed food before eating. Strain lumps, chunks, pulp, and seeds from pureed foods before eating. Reheat foods slowly to prevent a tough crust from forming. Meal planning Eat a variety of foods to get all the nutrients you need. Add dry milk or protein powder to food to increase calories and protein content. Follow your meal plan as told by your dietitian. General information You may eat foods that are soft and have a pudding-like texture. Do not eat foods that you have to chew. If you have to chew the food, then you cannot eat it. Avoid foods that are hard, dry, sticky, chunky, lumpy, or stringy. Also avoid foods with nuts, seeds, raisins, skins, or pulp. You may  be instructed to thicken liquids. Follow your health care provider's instructions about how to do this and to what consistency. The foods you may eat are usually eaten with a spoon. You can use a spoon or fork to test the texture of a food: Using a spoon, you can do a spoon tilt test to check if food holds together on the spoon and is not too firm, too sticky, or too thick. Food should hold its shape on the spoon and slide off easily with almost no food left on the spoon. With food on a fork, the food should sit on a mound or pile on top of the fork and should not seep or drip through the fork prongs continuously. What foods should I eat?  Fruits Pureed fruits such as melons and apples without seeds or pulp. Mashed bananas. Mashed avocado. Fruit juices without pulp or seeds. Vegetables Pureed vegetables. Smooth tomato paste or sauce. Mashed or pureed potatoes without skin. Grains Soft breads, pancakes, French toast, muffins, and bread stuffing pureed to a smooth, moist texture, without nuts or seeds. Cooked cereals that have a pudding-like consistency, such as hot wheat cereal or farina. Pureed oatmeal. Pureed, well-cooked pasta and rice. Meats and other proteins Pureed meat, poultry, and fish. Smooth pate or liverwurst. Smooth souffles. Pureed beans such as lentils. Pureed eggs. Smooth nut and seed butters. Pureed tofu. Dairy Yogurt. Milk. Pureed cottage cheese. Nutritional dairy drinks or shakes. Cream cheese. Smooth pudding, ice cream, sherbet,   and malts. Fats and oils Butter. Margarine. Vegetable oils. Smooth and strained gravy. Sour cream. Mayonnaise. Smooth sauces such as white sauce, cheese sauce, or hollandaise sauce. Sweets and desserts Moistened and pureed cookies and cakes. Whipped topping. Gelatin. Pudding pops. Seasonings and other foods Finely ground spices. Jelly. Honey. Pureed casseroles. Strained soups. Pureed sandwiches. Beverages Anything prepared at the consistency  recommended by your health care provider. The items listed above may not be a complete list of foods and beverages you can eat. Contact a dietitian for more information. What foods should I avoid? Fruits Whole fresh, frozen, canned, or dried fruits that have not been pureed. Stringy fruits, such as pineapple or coconut. Watermelon with seeds. Dried fruit or fruit leather. Vegetables Whole vegetables. Stringy vegetables such as celery. Tomatoes or tomato sauce with seeds. Fried vegetables. Grains Oatmeal. Dry cereals. Hard breads. Breads with seeds or nuts. Whole pasta, rice, or other grains. Whole pancakes, waffles, biscuits, muffins, or rolls. Meats and other proteins Whole or ground meat, fish, or poultry. Dried or cooked lentils or legumes that have been cooked but not mashed or pureed. Non-pureed eggs. Nuts and seeds. Crunchy peanut butter. Whole tofu or other meat alternatives. Dairy Cheese cubes or slices. Non-pureed cottage cheese. Yogurt with fruit chunks. Fats and oils All fats and sauces that have lumps or chunks. Sweets and desserts Solid desserts. Sticky, chewy sweets such as licorice and caramel. Candy with nuts or coconut. Seasonings and other foods Coarse or seeded herbs and spices. Chunky preserves. Jams with seeds. Whole sandwiches. Non-pureed casseroles. Chunky soups. The items listed above may not be a complete list of foods and beverages you should avoid. Contact a dietitian for more information. Summary Pureed foods can be helpful for those who have difficulty chewing or problems controlling or moving food with their tongues. On this dysphagia eating plan, you may eat foods that are soft and have a pudding-like texture. Do not eat foods that you have to chew. If you have to chew the food, then you cannot eat it. You may be instructed to thicken liquids. Follow your health care provider's instructions about how to do this and to what consistency. This information is not  intended to replace advice given to you by your health care provider. Make sure you discuss any questions you have with your health care provider. Document Revised: 12/24/2021 Document Reviewed: 12/24/2021 Elsevier Patient Education  2023 Elsevier Inc.    

## 2023-03-16 NOTE — Op Note (Signed)
[address]       [name] [address]             [phone]        03/16/2023  Patient:  Dana Williams Pre-Op Dx: Paraesophageal hernia repair Hx of Cameron's Ulcers Chronic anemia Obesity   Post-op Dx:  same Procedure: - Esophagoscopy - Robotic assisted laparoscopy - Paraesophageal hernia repair with Myriad mesh pledgets -  Toupet Fundoplication - Gastropexy   Surgeon and Role:      * Reo Portela, Eliezer Lofts, MD - Primary  Assistant: Jacques Earthly, PA-C  An experienced assistant was required given the complexity of this surgery and the standard of surgical care. The assistant was needed for exposure, dissection, suctioning, retraction of delicate tissues and sutures, instrument exchange and for overall help during this procedure.   Anesthesia  general EBL:  Blood Administration: none Specimen:  none   Counts: correct   Indications: 62yo female with large PEH, and recurrent anemia, likely due to the Cameron's ulcers. She has had a previous laparoscopic cholecystectomy in the past. We discussed the risks and benefits of an EGD, with robotic assisted PEH repair with fundoplication. I do think that this will allow her Cameron's ulcers to heal, and potentially resolve her problems with anemia. She is agreeable to proceed.   Findings: Large hiatal defect.  4 posterior stitches.    Operative Technique: After the risks, benefits and alternatives were thoroughly discussed, the patient was brought to the operative theatre.  Anesthesia was induced, and the esophagoscope was passed through the oropharynx down to the stomach.  The scope was retroflexed and the hiatal hernia was clearly evident.  The scope was pulled back and the mucosal surface of the esophagus was visualized.    The scope was then parked at 25 cm from the incisors.  The patient was then prepped and draped in normal sterile fashion.  An appropriate surgical pause was performed, and  pre-operative antibiotics were dosed accordingly.  We began with a 1 cm incision 15 cm caudad from the xiphoid and slightly lateral to the umbilicus.  Using an Optiview we entered the peritoneal space.  The abdomen was then insufflated with CO2.  3 other robotic ports were placed to triangulate the hiatus.  Another 12 mm port was placed in place at the level of the umbilicus laterally for an assistant port and another 5 mm trocar was placed in the right lower quadrant for liver retractor.  The patient was then placed in steep reverse Trendelenburg and the liver was elevated to expose the esophageal hiatus.  And then the robot was docked.  We began by dividing the gastrohepatic ligament to expose the right diaphragmatic crus and then dissected the hernia sac in a clockwise fashion to mobilize there the stomach and esophagus.  We then divided the short gastrics and moved towards the right crus and completed our dissection along the esophageal hiatus.  A Penrose drain was then used to encircle the the esophagus and we continued our dissection up into the mediastinum.  Once we had achieved 3 to 4 cm of intra-abdominal esophagus we then proceeded to reapproximate the crura with 0 Ethibond sutures in an interrupted fashion.  Myriad mesh pledgets were used to buttress the repair.  The gastroscope was passed down through the lower esophageal sphincter into the stomach and would act as our bougie during this repair.  Next the stomach was passed posterior to the esophagus and  a Toupet fundoplication was performed.  The stomach was then pexied to the abdominal wall.  An air leak test was performed using the gastroscope.  No leak was evident.  The liver retractor was removed and all ports were removed under direct visualization.  The skin and soft tissue were closed with absorbable suture    The patient tolerated the procedure without any immediate complications, and was transferred to the PACU in stable  condition.  [name]

## 2023-03-16 NOTE — Hospital Course (Addendum)
HPI: This is a 62 y.o. female presents for surgical evaluation of a large PEH.  She has undergone endoscopy which notes several Cameron's ulcers.  She has also had several bought of anemia.  She also complains of reflux and dysphagia, along with some shortness of breath.  She is recently recovering from an upper respiratory tract infection. Dr. Cliffton Asters discussed the risks and benefits of an EGD, with robotic assisted PEH repair with fundoplication. Potential risks, benefits, and complications of the surgery were discussed with the patient and she agreed to proceed with surgery. Dr. Cliffton Asters thinks that this will allow her Cameron's ulcers to heal, and potentially resolve her problems with anemia.   Hospital Course: Patient underwent an EGD,  robotic assisted PEH repair, fundoplication, and gastropexy. Patient was transferred from the OR to PACU in stable condition.

## 2023-03-16 NOTE — Transfer of Care (Signed)
Immediate Anesthesia Transfer of Care Note  Patient: Dana Williams  Procedure(s) Performed: XI ROBOTIC ASSISTED PARAESOPHAGEAL HERNIA REPAIR WITH FUNDOPLICATION (Chest) ESOPHAGOGASTRODUODENOSCOPY (EGD)  Patient Location: PACU  Anesthesia Type:General  Level of Consciousness: awake, alert , and oriented  Airway & Oxygen Therapy: Patient Spontanous Breathing and Patient connected to nasal cannula oxygen  Post-op Assessment: Report given to RN and Post -op Vital signs reviewed and stable  Post vital signs: Reviewed and stable  Last Vitals:  Vitals Value Taken Time  BP 125/76 03/16/23 1200  Temp    Pulse 87 03/16/23 1203  Resp 19 03/16/23 1203  SpO2 94 % 03/16/23 1203  Vitals shown include unvalidated device data.  Last Pain:  Vitals:   03/16/23 0723  PainSc: 0-No pain         Complications: No notable events documented.

## 2023-03-16 NOTE — Brief Op Note (Signed)
03/16/2023  11:43 AM  PATIENT:  Deloris Ping  62 y.o. female  PRE-OPERATIVE DIAGNOSIS:  1. Paraesophageal hernia 2. Cameron Lesions  POST-OPERATIVE DIAGNOSIS:  1. Paraesophageal hernia 2. Cameron Lesions  PROCEDURE: ESOPHAGOGASTRODUODENOSCOPY (EGD), XI ROBOTIC ASSISTED PARAESOPHAGEAL HERNIA REPAIR WITH FUNDOPLICATION and GASTROPEXY  SURGEON:  Surgeon(s) and Role:    [place], Eliezer Lofts, MD - Primary  PHYSICIAN ASSISTANT: Doree Fudge PA-C  ANESTHESIA:   general  EBL: Minimal  BLOOD ADMINISTERED:none  DRAINS:  No drains    LOCAL MEDICATIONS USED:  OTHER Exparel  COUNTS CORRECT:  YES   DICTATION: .Dragon Dictation  PLAN OF CARE: Admit to inpatient   PATIENT DISPOSITION:  PACU - hemodynamically stable.   Delay start of Pharmacological VTE agent (>24hrs) due to surgical blood loss or risk of bleeding: no

## 2023-03-16 NOTE — Interval H&P Note (Signed)
History and Physical Interval Note:  03/16/2023 7:52 AM  Dana Williams  has presented today for surgery, with the diagnosis of PEH.  The various methods of treatment have been discussed with the patient and family. After consideration of risks, benefits and other options for treatment, the patient has consented to  Procedure(s): XI ROBOTIC ASSISTED PARAESOPHAGEAL HERNIA REPAIR WITH FUNDOPLICATION (N/A) ESOPHAGOGASTRODUODENOSCOPY (EGD) (N/A) as a surgical intervention.  The patient's history has been reviewed, patient examined, no change in status, stable for surgery.  I have reviewed the patient's chart and labs.  Questions were answered to the patient's satisfaction.     Robertt Buda Keane Scrape

## 2023-03-16 NOTE — Anesthesia Procedure Notes (Signed)
Procedure Name: Intubation Date/Time: 03/16/2023 8:46 AM  Performed by: Jodell Cipro, CRNAPre-anesthesia Checklist: Patient identified, Emergency Drugs available, Suction available and Patient being monitored Patient Re-evaluated:Patient Re-evaluated prior to induction Oxygen Delivery Method: Circle System Utilized Preoxygenation: Pre-oxygenation with 100% oxygen Induction Type: IV induction Ventilation: Mask ventilation without difficulty and Oral airway inserted - appropriate to patient size Laryngoscope Size: Glidescope and 3 Grade View: Grade I Tube type: Oral Tube size: 7.0 mm Number of attempts: 1 Airway Equipment and Method: Stylet and Oral airway Placement Confirmation: ETT inserted through vocal cords under direct vision, positive ETCO2 and breath sounds checked- equal and bilateral Secured at: 21 cm Tube secured with: Tape Dental Injury: Teeth and Oropharynx as per pre-operative assessment

## 2023-03-16 NOTE — Anesthesia Preprocedure Evaluation (Addendum)
Anesthesia Evaluation  Patient identified by MRN, date of birth, ID band Patient awake    Reviewed: Allergy & Precautions, NPO status , Patient's Chart, lab work & pertinent test results  History of Anesthesia Complications (+) PONV and history of anesthetic complications  Airway Mallampati: II  TM Distance: >3 FB Neck ROM: Full    Dental  (+) Dental Advisory Given   Pulmonary neg pulmonary ROS   breath sounds clear to auscultation       Cardiovascular negative cardio ROS  Rhythm:Regular Rate:Normal     Neuro/Psych negative neurological ROS     GI/Hepatic Neg liver ROS, hiatal hernia, PUD,GERD  ,,  Endo/Other  negative endocrine ROS    Renal/GU negative Renal ROS     Musculoskeletal  (+) Arthritis ,    Abdominal   Peds  Hematology  (+) Blood dyscrasia, anemia   Anesthesia Other Findings   Reproductive/Obstetrics                             Anesthesia Physical Anesthesia Plan  ASA: 2  Anesthesia Plan: General   Post-op Pain Management: Tylenol PO (pre-op)*, Toradol IV (intra-op)* and Ketamine IV*   Induction:   PONV Risk Score and Plan: 4 or greater and Ondansetron, Dexamethasone, Propofol infusion, Midazolam, Scopolamine patch - Pre-op and Treatment may vary due to age or medical condition  Airway Management Planned: Oral ETT  Additional Equipment:   Intra-op Plan:   Post-operative Plan: Extubation in OR  Informed Consent: I have reviewed the patients History and Physical, chart, labs and discussed the procedure including the risks, benefits and alternatives for the proposed anesthesia with the patient or authorized representative who has indicated his/her understanding and acceptance.     Dental advisory given  Plan Discussed with: CRNA  Anesthesia Plan Comments:        Anesthesia Quick Evaluation

## 2023-03-16 NOTE — Anesthesia Postprocedure Evaluation (Signed)
Anesthesia Post Note  Patient: Dana Williams  Procedure(s) Performed: XI ROBOTIC ASSISTED PARAESOPHAGEAL HERNIA REPAIR WITH FUNDOPLICATION (Chest) ESOPHAGOGASTRODUODENOSCOPY (EGD)     Patient location during evaluation: PACU Anesthesia Type: General Level of consciousness: awake and alert Pain management: pain level controlled Vital Signs Assessment: post-procedure vital signs reviewed and stable Respiratory status: spontaneous breathing, nonlabored ventilation, respiratory function stable and patient connected to nasal cannula oxygen Cardiovascular status: blood pressure returned to baseline and stable Postop Assessment: no apparent nausea or vomiting Anesthetic complications: no  No notable events documented.  Last Vitals:  Vitals:   03/16/23 1400 03/16/23 1438  BP: (!) 140/84 (!) 145/93  Pulse: 77 82  Resp: 13 15  Temp:  36.9 C  SpO2: 96% 96%    Last Pain:  Vitals:   03/16/23 1438  TempSrc: Oral  PainSc:                  Kennieth Rad

## 2023-03-16 NOTE — Discharge Summary (Addendum)
[address]       [place] [address]             [phone]    Physician Discharge Summary  Patient ID: Dana Williams MRN: [id number] DOB/AGE: Jan 17, 1961 62 y.o.  Admit date: 03/16/2023 Discharge date: 03/17/2023  Admission Diagnoses:  Patient Active Problem List   Diagnosis Date Noted   S/P repair of paraesophageal hernia 03/16/2023   Paraesophageal hernia 03/16/2023   Elevated blood pressure reading 01/06/2023   Iron deficiency anemia 11/05/2022   Cameron lesion, chronic 11/04/2022   Large hiatal hernia 11/04/2022   Symptomatic anemia 06/19/2022   Obesity (BMI 30-39.9) 06/19/2022   Prediabetes 02/10/2021   Insulin resistance 05/12/2020   History of kidney stones 04/25/2019   History of diverticulosis 04/25/2019   History of gastroesophageal reflux (GERD) 04/25/2019   Anxiety and depression 04/25/2019   Psoriasis 04/25/2019   Primary osteoarthritis of both knees 04/25/2019   Primary osteoarthritis of both feet 04/25/2019   Vitamin D deficiency 04/17/2019   Class 2 severe obesity with serious comorbidity and body mass index (BMI) of 39.0 to 39.9 in adult (HCC) 04/17/2019   Adverse food reaction 12/07/2018   Rash and other nonspecific skin eruption 12/06/2018   Primary localized osteoarthritis of left knee 10/27/2016   History of colon polyps 01/29/2014   Hyperlipidemia 04/18/2012     Discharge Diagnoses:  Patient Active Problem List   Diagnosis Date Noted   S/P repair of paraesophageal hernia 03/16/2023   Paraesophageal hernia 03/16/2023   Elevated blood pressure reading 01/06/2023   Iron deficiency anemia 11/05/2022   Cameron lesion, chronic 11/04/2022   Large hiatal hernia 11/04/2022   Symptomatic anemia 06/19/2022   Obesity (BMI 30-39.9) 06/19/2022   Prediabetes 02/10/2021   Insulin resistance 05/12/2020   History of kidney stones 04/25/2019   History of diverticulosis 04/25/2019   History of gastroesophageal reflux (GERD) 04/25/2019    Anxiety and depression 04/25/2019   Psoriasis 04/25/2019   Primary osteoarthritis of both knees 04/25/2019   Primary osteoarthritis of both feet 04/25/2019   Vitamin D deficiency 04/17/2019   Class 2 severe obesity with serious comorbidity and body mass index (BMI) of 39.0 to 39.9 in adult (HCC) 04/17/2019   Adverse food reaction 12/07/2018   Rash and other nonspecific skin eruption 12/06/2018   Primary localized osteoarthritis of left knee 10/27/2016   History of colon polyps 01/29/2014   Hyperlipidemia 04/18/2012     Discharged Condition: Stable  HPI: This is a 62 y.o. female presents for surgical evaluation of a large PEH.  She has undergone endoscopy which notes several Cameron's ulcers.  She has also had several bought of anemia.  She also complains of reflux and dysphagia, along with some shortness of breath.  She is recently recovering from an upper respiratory tract infection. Dr. Cliffton Asters discussed the risks and benefits of an EGD, with robotic assisted PEH repair with fundoplication. Potential risks, benefits, and complications of the surgery were discussed with the patient and she agreed to proceed with surgery. Dr. Cliffton Asters thinks that this will allow her Cameron's ulcers to heal, and potentially resolve her problems with anemia.   Hospital Course: Patient underwent an EGD,  robotic assisted PEH repair, fundoplication, and gastropexy. Patient was extubated and transferred from the OR to PACU in stable condition. She was NPO and esophagram done 05/02 showed expected post op findings from recent fundoplication, no evidence of leak or obstruction. She tolerated a dysphagia I diet.  She was instructed to crush medications (put in applesauce). Her pain was well controlled. All wounds are clean, dry, healing without signs of infection. She was ambulating on room air with good oxygenation. She is felt surgically stable for discharge today.  Consults: None  Significant Diagnostic  Studies:  Narrative & Impression  CLINICAL DATA:  [id number] Pneumothorax [id number]   EXAM: PORTABLE CHEST 1 VIEW   COMPARISON:  Mar 16, 2023   FINDINGS: The cardiomediastinal silhouette is unchanged in contour. Favored trace LEFT pleural effusion. No significant pneumothorax. No acute pleuroparenchymal abnormality.   IMPRESSION: Favored trace LEFT pleural effusion. No significant pneumothorax.     Electronically Signed   By: Meda Klinefelter M.D.   On: 03/17/2023 08:29    Treatments: surgery:  Esophagoscopy - Robotic assisted laparoscopy - Paraesophageal hernia repair with Myriad mesh pledgets -  Toupet Fundoplication - Gastropexy by Dr. Cliffton Asters on 03/17/2023.  Discharge Exam: Blood pressure 124/76, pulse 81, temperature 98.1 F (36.7 C), temperature source Oral, resp. rate 18, height 5\' 5"  (1.651 m), weight 110.7 kg, SpO2 92 %. Cardiovascular: RRR Pulmonary: Clear to auscultation bilaterally Abdomen: Soft, non tender, occasional bowel sounds present. Extremities: No lower extremity edema. Wounds: Clean and dry.  No erythema or signs of infection.   Discharge Medications: Allergies as of 03/17/2023       Reactions   Sulfa Antibiotics Other (See Comments)   Mouth ulcers        Medication List     STOP taking these medications    esomeprazole 40 MG capsule Commonly known as: NEXIUM       TAKE these medications    ammonium lactate 12 % lotion Commonly known as: LAC-HYDRIN Apply on the skin twice a day What changed:  how much to take when to take this   Azelaic Acid 15 % gel Commonly known as: Finacea Apply to affected area daily What changed:  how much to take when to take this reasons to take this   B-D 3CC LUER-LOK SYR 18GX1-1/2 18G X 1-1/2" 3 ML Misc Generic drug: SYRINGE-NEEDLE (DISP) 3 ML to draw up cyanocobalamin to inject  once a week   BD Disp Needle 23G X 1" Misc Generic drug: NEEDLE (DISP) 23 G Use to adminster the B-12 into the  muscle once a week   calcipotriene-betamethasone external suspension Commonly known as: Taclonex Apply to affected area daily What changed:  how much to take when to take this reasons to take this   cloNIDine 0.1 MG tablet Commonly known as: CATAPRES Take 1 tablet (0.1 mg total) by mouth daily.   cyanocobalamin 1000 MCG/ML injection Commonly known as: VITAMIN B12 Inject 1 mL (1,000 mcg total) into the muscle every 30 (thirty) days.   hydrOXYzine 10 MG tablet Commonly known as: ATARAX Take 2 tablets by mouth at bedtime   ondansetron 4 MG disintegrating tablet Commonly known as: ZOFRAN-ODT Dissolve 1 tablet (4 mg total) by mouth every 8 (eight) hours as needed for nausea or vomiting.   oxyCODONE 5 MG immediate release tablet Commonly known as: Oxy IR/ROXICODONE Take 1 tablet (5 mg total) by mouth every 6 (six) hours as needed for severe pain.   rosuvastatin 10 MG tablet Commonly known as: Crestor Take 1 tablet by mouth once daily   sertraline 100 MG tablet Commonly known as: Zoloft Take 1 tablet (100 mg total) by mouth daily.   Vitamin D (Ergocalciferol) 1.25 MG (50000 UNIT) Caps capsule Commonly known as: DRISDOL Take 1 capsule by mouth  once a week   VITAMIN D PO Take 1 capsule by mouth daily.        Follow-up Information     Corliss Skains, MD Follow up on 03/25/2023.   Specialty: Cardiothoracic Surgery Why: Appointment is VIRTUAL. Dr. Cliffton Asters will call you on 05/10 at 2:10 pm Contact information: [address] [place] [address] [phone]                 Signed:  Elenore Rota 03/17/2023, 12:43 PM

## 2023-03-17 ENCOUNTER — Other Ambulatory Visit: Payer: Self-pay

## 2023-03-17 ENCOUNTER — Inpatient Hospital Stay (HOSPITAL_COMMUNITY): Payer: Commercial Managed Care - PPO

## 2023-03-17 ENCOUNTER — Encounter (HOSPITAL_COMMUNITY): Payer: Self-pay | Admitting: Thoracic Surgery (Cardiothoracic Vascular Surgery)

## 2023-03-17 ENCOUNTER — Telehealth: Payer: Self-pay

## 2023-03-17 ENCOUNTER — Encounter: Payer: Self-pay | Admitting: Pharmacist

## 2023-03-17 ENCOUNTER — Other Ambulatory Visit (HOSPITAL_COMMUNITY): Payer: Self-pay

## 2023-03-17 DIAGNOSIS — K449 Diaphragmatic hernia without obstruction or gangrene: Secondary | ICD-10-CM | POA: Diagnosis not present

## 2023-03-17 DIAGNOSIS — J939 Pneumothorax, unspecified: Secondary | ICD-10-CM | POA: Diagnosis not present

## 2023-03-17 DIAGNOSIS — Z01818 Encounter for other preprocedural examination: Secondary | ICD-10-CM | POA: Diagnosis not present

## 2023-03-17 LAB — CBC
HCT: 27.1 % — ABNORMAL LOW (ref 36.0–46.0)
Hemoglobin: 8.3 g/dL — ABNORMAL LOW (ref 12.0–15.0)
MCH: 23.6 pg — ABNORMAL LOW (ref 26.0–34.0)
MCHC: 30.6 g/dL (ref 30.0–36.0)
MCV: 77.2 fL — ABNORMAL LOW (ref 80.0–100.0)
Platelets: 242 10*3/uL (ref 150–400)
RBC: 3.51 MIL/uL — ABNORMAL LOW (ref 3.87–5.11)
RDW: 15.2 % (ref 11.5–15.5)
WBC: 6.8 10*3/uL (ref 4.0–10.5)
nRBC: 0 % (ref 0.0–0.2)

## 2023-03-17 LAB — BASIC METABOLIC PANEL
Anion gap: 6 (ref 5–15)
BUN: 10 mg/dL (ref 8–23)
CO2: 26 mmol/L (ref 22–32)
Calcium: 8.7 mg/dL — ABNORMAL LOW (ref 8.9–10.3)
Chloride: 107 mmol/L (ref 98–111)
Creatinine, Ser: 0.91 mg/dL (ref 0.44–1.00)
GFR, Estimated: >60 mL/min (ref >60)
Glucose, Bld: 135 mg/dL — ABNORMAL HIGH (ref 70–99)
Potassium: 4.1 mmol/L (ref 3.5–5.1)
Sodium: 139 mmol/L (ref 135–145)

## 2023-03-17 MED ORDER — OXYCODONE HCL 5 MG PO TABS
5.0000 mg | ORAL_TABLET | Freq: Four times a day (QID) | ORAL | 0 refills | Status: DC | PRN
  Filled 2023-03-17 (×2): qty 30, 8d supply, fill #0

## 2023-03-17 MED ORDER — ONDANSETRON 4 MG PO TBDP
4.0000 mg | ORAL_TABLET | Freq: Three times a day (TID) | ORAL | 0 refills | Status: DC | PRN
  Filled 2023-03-17 (×2): qty 10, 4d supply, fill #0

## 2023-03-17 MED ORDER — ROSUVASTATIN CALCIUM 5 MG PO TABS
10.0000 mg | ORAL_TABLET | Freq: Every day | ORAL | Status: DC
  Administered 2023-03-17: 10 mg via ORAL
  Filled 2023-03-17: qty 2

## 2023-03-17 MED ORDER — IOHEXOL 300 MG/ML  SOLN: 100.0000 mL | Freq: Once | INTRAMUSCULAR | Status: DC | PRN

## 2023-03-17 MED ORDER — SERTRALINE HCL 100 MG PO TABS
100.0000 mg | ORAL_TABLET | Freq: Every day | ORAL | Status: DC
  Administered 2023-03-17: 100 mg via ORAL
  Filled 2023-03-17: qty 1

## 2023-03-17 NOTE — Progress Notes (Addendum)
      [address]       [name] [address]             [phone]       1 Day Post-Op Procedure(s) (LRB): XI ROBOTIC ASSISTED PARAESOPHAGEAL HERNIA REPAIR WITH FUNDOPLICATION (N/A) ESOPHAGOGASTRODUODENOSCOPY (EGD) (N/A)  Subjective: Patient denies nausea and states pain is under good control.  Objective: Vital signs in last 24 hours: Temp:  [97.8 F (36.6 C)-98.8 F (37.1 C)] 98.1 F (36.7 C) (05/02 0324) Pulse Rate:  [76-95] 76 (05/02 0324) Cardiac Rhythm: Normal sinus rhythm (05/01 2005) Resp:  [11-21] 11 (05/02 0324) BP: (124-145)/(71-93) 145/71 (05/02 0324) SpO2:  [92 %-96 %] 94 % (05/02 0324)      Intake/Output from previous day: 05/01 0701 - 05/02 0700 In: 2000 [I.V.:1800; IV Piggyback:200] Out: 230 [Urine:130; Blood:100]   Physical Exam:  Cardiovascular: RRR Pulmonary: Clear to auscultation bilaterally Abdomen: Soft, non tender, occasional bowel sounds present. Extremities: No lower extremity edema. Wounds: Clean and dry.  No erythema or signs of infection.   Lab Results: CBC: Recent Labs    03/14/23 0857 03/17/23 0031  WBC 5.9 6.8  HGB 10.1* 8.3*  HCT 34.7* 27.1*  PLT 310 242   BMET:  Recent Labs    03/14/23 0857 03/17/23 0031  NA 140 139  K 3.3* 4.1  CL 105 107  CO2 27 26  GLUCOSE 103* 135*  BUN 10 10  CREATININE 0.86 0.91  CALCIUM 9.4 8.7*    PT/INR:  Recent Labs    03/14/23 0857  LABPROT 13.3  INR 1.0   ABG:  INR: Will add last result for INR, ABG once components are confirmed Will add last 4 CBG results once components are confirmed  Assessment/Plan:  1. CV - SR 2.  Pulmonary - On room air. CXR appears stable. 3. GI-Await esophagram result. If no leak, dysphagia I diet 4. Anemia-has had from Cameron's ulcers, mild blood loss anemia from surgery. H and H this am 8.3 and 27.1 5. Possibly home later today vs am  Dana Williams [facility] 03/17/2023,7:05 AM  Agree with above Swallow today If clear  will advance diet and discharge  Dana Williams

## 2023-03-17 NOTE — Plan of Care (Signed)

## 2023-03-17 NOTE — Telephone Encounter (Signed)
FMLA form completed and faxed to [employer] @ [phone]. Beginning LOA 03/16/23 through approx 05/02/23. DOS 03/16/23

## 2023-03-17 NOTE — Progress Notes (Signed)
  Transition of Care [facility]) Screening Note   Patient Details  Name: Dana Williams Date of Birth: 01/19/1961   Transition of Care [facility]) CM/SW Contact:    Harriet Masson, RN Phone Number: 03/17/2023, 9:31 AM    Transition of Care Department [facility]) has reviewed patient and no TOC needs have been identified at this time. We will continue to monitor patient advancement through interdisciplinary progression rounds. If new patient transition needs arise, please place a TOC consult.

## 2023-03-25 ENCOUNTER — Ambulatory Visit (INDEPENDENT_AMBULATORY_CARE_PROVIDER_SITE_OTHER): Payer: Self-pay | Admitting: Thoracic Surgery (Cardiothoracic Vascular Surgery)

## 2023-03-25 DIAGNOSIS — K449 Diaphragmatic hernia without obstruction or gangrene: Secondary | ICD-10-CM

## 2023-03-25 NOTE — Progress Notes (Signed)
     [address]       [place] [address]             [phone]       Patient: Home Provider: Office Consent for Telemedicine visit obtained.  Today's visit was completed via a real-time telehealth (see specific modality noted below). The patient/authorized person provided oral consent at the time of the visit to engage in a telemedicine encounter with the present provider at [facility]. The patient/authorized person was informed of the potential benefits, limitations, and risks of telemedicine. The patient/authorized person expressed understanding that the laws that protect confidentiality also apply to telemedicine. The patient/authorized person acknowledged understanding that telemedicine does not provide emergency services and that he or she would need to call 911 or proceed to the nearest hospital for help if such a need arose.   Total time spent in the clinical discussion 10 minutes.  Telehealth Modality: Phone visit (audio only)  I had a telephone visit with Dana Williams.  She is s/p PEH repair.  She is doing well.  She denies any reflux.  She is tolerating her diet.  We will advance her diet and see her in 1 month with a CXR.  Jemmie Rhinehart Keane Scrape

## 2023-04-14 ENCOUNTER — Other Ambulatory Visit (HOSPITAL_COMMUNITY): Payer: Self-pay

## 2023-04-20 ENCOUNTER — Other Ambulatory Visit (HOSPITAL_COMMUNITY): Payer: Self-pay

## 2023-04-20 DIAGNOSIS — Z6835 Body mass index (BMI) 35.0-35.9, adult: Secondary | ICD-10-CM | POA: Diagnosis not present

## 2023-04-20 DIAGNOSIS — R053 Chronic cough: Secondary | ICD-10-CM | POA: Diagnosis not present

## 2023-04-20 MED ORDER — AZITHROMYCIN 250 MG PO TABS
ORAL_TABLET | ORAL | 0 refills | Status: DC
  Filled 2023-04-20: qty 6, 5d supply, fill #0

## 2023-04-20 MED ORDER — BENZONATATE 200 MG PO CAPS
200.0000 mg | ORAL_CAPSULE | Freq: Three times a day (TID) | ORAL | 0 refills | Status: DC
  Filled 2023-04-20: qty 45, 15d supply, fill #0

## 2023-04-25 ENCOUNTER — Other Ambulatory Visit (HOSPITAL_COMMUNITY): Payer: Self-pay

## 2023-04-25 MED ORDER — PROMETHAZINE-DM 6.25-15 MG/5ML PO SYRP
5.0000 mL | ORAL_SOLUTION | Freq: Four times a day (QID) | ORAL | 0 refills | Status: DC | PRN
  Filled 2023-04-25: qty 280, 14d supply, fill #0

## 2023-05-04 ENCOUNTER — Other Ambulatory Visit: Payer: Self-pay | Admitting: Thoracic Surgery (Cardiothoracic Vascular Surgery)

## 2023-05-04 DIAGNOSIS — K449 Diaphragmatic hernia without obstruction or gangrene: Secondary | ICD-10-CM

## 2023-05-06 ENCOUNTER — Ambulatory Visit: Payer: Commercial Managed Care - PPO | Admitting: Thoracic Surgery (Cardiothoracic Vascular Surgery)

## 2023-05-06 ENCOUNTER — Other Ambulatory Visit: Payer: Self-pay | Admitting: Thoracic Surgery (Cardiothoracic Vascular Surgery)

## 2023-05-06 ENCOUNTER — Ambulatory Visit (HOSPITAL_COMMUNITY)
Admission: RE | Admit: 2023-05-06 | Discharge: 2023-05-06 | Disposition: A | Discharge status: Home / Self Care | Payer: Commercial Managed Care - PPO | Source: Ambulatory Visit | Attending: Thoracic Surgery (Cardiothoracic Vascular Surgery) | Admitting: Thoracic Surgery (Cardiothoracic Vascular Surgery)

## 2023-05-06 ENCOUNTER — Ambulatory Visit (INDEPENDENT_AMBULATORY_CARE_PROVIDER_SITE_OTHER): Payer: Self-pay | Admitting: Thoracic Surgery (Cardiothoracic Vascular Surgery)

## 2023-05-06 VITALS — BP 136/86 | HR 108 | Resp 18 | Ht 65.0 in | Wt 212.0 lb

## 2023-05-06 DIAGNOSIS — Z9889 Other specified postprocedural states: Secondary | ICD-10-CM

## 2023-05-06 DIAGNOSIS — K449 Diaphragmatic hernia without obstruction or gangrene: Secondary | ICD-10-CM

## 2023-05-06 DIAGNOSIS — Z8719 Personal history of other diseases of the digestive system: Secondary | ICD-10-CM

## 2023-05-06 NOTE — Progress Notes (Signed)
      [address]       [place] [address]             [phone]        Dana Williams [facility] Medical Record #[id number] Date of Birth: Jul 26, 1961  Referring: Dana Cipro, NP Primary Care: Dana Cipro, NP Primary Cardiologist:None  Reason for visit:   follow-up  History of Present Illness:     62 year old female presents for a follow-up appointment.  Overall she is doing well.  She denies any reflux.  Her pain is well-controlled.  Physical Exam: BP 136/86   Pulse (!) 108   Resp 18   Ht 5\' 5"  (1.651 m)   Wt 212 lb (96.2 kg)   SpO2 96% Comment: RA  BMI 35.28 kg/m   Alert NAD Abdomen, ND No peripheral edema   Diagnostic Studies & Laboratory data: CXR: Clear     Assessment / Plan:   62 year old female status post paraesophageal hernia repair.  I will follow-up with her in 3 months for symptom check as well as a repeat CBC   Dana Williams O Dana Williams 05/06/2023 4:09 PM

## 2023-05-31 DIAGNOSIS — D500 Iron deficiency anemia secondary to blood loss (chronic): Secondary | ICD-10-CM | POA: Diagnosis not present

## 2023-05-31 DIAGNOSIS — R030 Elevated blood-pressure reading, without diagnosis of hypertension: Secondary | ICD-10-CM | POA: Diagnosis not present

## 2023-05-31 DIAGNOSIS — Z6838 Body mass index (BMI) 38.0-38.9, adult: Secondary | ICD-10-CM | POA: Diagnosis not present

## 2023-06-09 ENCOUNTER — Ambulatory Visit (INDEPENDENT_AMBULATORY_CARE_PROVIDER_SITE_OTHER): Payer: Commercial Managed Care - PPO | Admitting: Family Medicine

## 2023-06-09 ENCOUNTER — Encounter (INDEPENDENT_AMBULATORY_CARE_PROVIDER_SITE_OTHER): Payer: Self-pay | Admitting: Family Medicine

## 2023-06-09 ENCOUNTER — Other Ambulatory Visit (HOSPITAL_COMMUNITY): Payer: Self-pay

## 2023-06-09 VITALS — BP 165/95 | HR 98 | Ht 65.0 in | Wt 233.0 lb

## 2023-06-09 DIAGNOSIS — Z6838 Body mass index (BMI) 38.0-38.9, adult: Secondary | ICD-10-CM

## 2023-06-09 DIAGNOSIS — E669 Obesity, unspecified: Secondary | ICD-10-CM | POA: Diagnosis not present

## 2023-06-09 DIAGNOSIS — F3289 Other specified depressive episodes: Secondary | ICD-10-CM | POA: Diagnosis not present

## 2023-06-09 DIAGNOSIS — I10 Essential (primary) hypertension: Secondary | ICD-10-CM | POA: Diagnosis not present

## 2023-06-09 MED ORDER — CHLORTHALIDONE 25 MG PO TABS
25.0000 mg | ORAL_TABLET | Freq: Every day | ORAL | 0 refills | Status: DC
Start: 2023-06-09 — End: 2023-07-21
  Filled 2023-06-09: qty 30, 30d supply, fill #0

## 2023-06-10 ENCOUNTER — Other Ambulatory Visit: Payer: Self-pay

## 2023-06-13 NOTE — Progress Notes (Signed)
Chief Complaint:   OBESITY Burley is here to discuss her progress with her obesity treatment plan along with follow-up of her obesity related diagnoses. Reva is on the Category 3 Plan and states she is following her eating plan approximately 0% of the time. Daveena states she is doing 0 minutes 0 times per week.  Today's visit was #: 36 Starting weight: 236 lbs Starting date: 08/10/2018 Today's weight: 233 lbs Today's date: 06/09/2023 Total lbs lost to date: 3 Total lbs lost since last in-office visit: 4  Interim History: Patient's last visit was approximately 6 months ago.  She has had some health issues that she has been working on, and she is ready to get back on track with her weight loss efforts.  Subjective:   1. Hypertension, essential Patient's blood pressure is elevated.  She was on clonidine right before surgery.  She denies chest pain or headache.  2. Emotional Eating Behavior Patient is working on decreasing emotional eating behavior.  She is in a better situation at work and she feels things are going well overall.  Assessment/Plan:   1. Hypertension, essential Patient agreed to discontinue clonidine, and start chlorthalidone 25 mg once daily with no refills.  We will follow-up at her next visit in 3 to 4 weeks.  - chlorthalidone (HYGROTON) 25 MG tablet; Take 1 tablet (25 mg total) by mouth daily.  Dispense: 30 tablet; Refill: 0  2. Emotional Eating Behavior Patient will work on emotional eating behavior strategies, and we will follow-up at her next visit in 3 to 4 weeks.  3. BMI 38.0-38.9,adult  4. Obesity, Beginning BMI 39.27 Krissi is currently in the action stage of change. As such, her goal is to get back to weightloss efforts . She has agreed to the Category 2 Plan or following a lower carbohydrate, vegetable and lean protein rich diet plan.   Behavioral modification strategies: increasing lean protein intake, increasing water intake, and no skipping  meals.  Shonia has agreed to follow-up with our clinic in 3 to 4 weeks. She was informed of the importance of frequent follow-up visits to maximize her success with intensive lifestyle modifications for her multiple health conditions.   Objective:   Blood pressure (!) 165/95, pulse 98, height 5\' 5"  (1.651 m), weight 233 lb (105.7 kg), SpO2 97%. Body mass index is 38.77 kg/m.  General: Cooperative, alert, well developed, in no acute distress. HEENT: Conjunctivae and lids unremarkable. Cardiovascular: Regular rhythm.  Lungs: Normal work of breathing. Neurologic: No focal deficits.   Lab Results  Component Value Date   CREATININE 0.91 03/17/2023   BUN 10 03/17/2023   NA 139 03/17/2023   K 4.1 03/17/2023   CL 107 03/17/2023   CO2 26 03/17/2023   Lab Results  Component Value Date   ALT 22 03/14/2023   AST 22 03/14/2023   ALKPHOS 136 (H) 03/14/2023   BILITOT 0.4 03/14/2023   Lab Results  Component Value Date   HGBA1C 5.5 01/06/2023   HGBA1C 5.3 06/20/2022   HGBA1C 6.0 12/29/2021   HGBA1C 5.8 (H) 05/28/2021   HGBA1C 5.7 (H) 05/28/2020   Lab Results  Component Value Date   INSULIN 13.0 01/06/2023   INSULIN 19.7 05/28/2021   INSULIN 19.4 05/28/2020   INSULIN 14.5 09/25/2019   INSULIN 11.5 12/12/2018   Lab Results  Component Value Date   TSH 1.350 01/06/2023   Lab Results  Component Value Date   CHOL 193 01/06/2023   HDL 83 01/06/2023  LDLCALC 89 01/06/2023   TRIG 120 01/06/2023   Lab Results  Component Value Date   VD25OH 25.5 (L) 01/06/2023   VD25OH 45.2 05/28/2021   VD25OH 41.0 05/28/2020   Lab Results  Component Value Date   WBC 6.8 03/17/2023   HGB 8.3 (L) 03/17/2023   HCT 27.1 (L) 03/17/2023   MCV 77.2 (L) 03/17/2023   PLT 242 03/17/2023   Lab Results  Component Value Date   IRON 43 01/06/2023   TIBC 489 (H) 01/06/2023   FERRITIN 34 01/06/2023   Attestation Statements:   Reviewed by clinician on day of visit: allergies, medications, problem  list, medical history, surgical history, family history, social history, and previous encounter notes.   I, Burt Knack, am acting as transcriptionist for Quillian Quince, MD.  I have reviewed the above documentation for accuracy and completeness, and I agree with the above. -  Quillian Quince, MD

## 2023-06-13 NOTE — Telephone Encounter (Signed)
Please advise 

## 2023-07-19 ENCOUNTER — Other Ambulatory Visit (HOSPITAL_COMMUNITY): Payer: Self-pay

## 2023-07-21 ENCOUNTER — Ambulatory Visit (INDEPENDENT_AMBULATORY_CARE_PROVIDER_SITE_OTHER): Payer: Commercial Managed Care - PPO | Admitting: Family Medicine

## 2023-07-21 ENCOUNTER — Other Ambulatory Visit: Payer: Self-pay

## 2023-07-21 ENCOUNTER — Other Ambulatory Visit (HOSPITAL_COMMUNITY): Payer: Self-pay

## 2023-07-21 ENCOUNTER — Encounter (INDEPENDENT_AMBULATORY_CARE_PROVIDER_SITE_OTHER): Payer: Self-pay | Admitting: Family Medicine

## 2023-07-21 ENCOUNTER — Encounter: Payer: Self-pay | Admitting: Internal Medicine

## 2023-07-21 VITALS — BP 131/90 | HR 107 | Temp 98.0°F | Ht 65.0 in | Wt 225.0 lb

## 2023-07-21 DIAGNOSIS — E559 Vitamin D deficiency, unspecified: Secondary | ICD-10-CM

## 2023-07-21 DIAGNOSIS — I10 Essential (primary) hypertension: Secondary | ICD-10-CM

## 2023-07-21 DIAGNOSIS — E669 Obesity, unspecified: Secondary | ICD-10-CM | POA: Diagnosis not present

## 2023-07-21 DIAGNOSIS — R0609 Other forms of dyspnea: Secondary | ICD-10-CM | POA: Diagnosis not present

## 2023-07-21 DIAGNOSIS — E538 Deficiency of other specified B group vitamins: Secondary | ICD-10-CM | POA: Diagnosis not present

## 2023-07-21 DIAGNOSIS — Z6837 Body mass index (BMI) 37.0-37.9, adult: Secondary | ICD-10-CM

## 2023-07-21 MED ORDER — VITAMIN B-12 1000 MCG PO TABS
1000.0000 ug | ORAL_TABLET | Freq: Every day | ORAL | 0 refills | Status: DC
Start: 2023-07-21 — End: 2023-09-15
  Filled 2023-07-21: qty 30, 30d supply, fill #0

## 2023-07-21 MED ORDER — ERGOCALCIFEROL 1.25 MG (50000 UT) PO CAPS
50000.0000 [IU] | ORAL_CAPSULE | ORAL | 0 refills | Status: DC
  Filled 2023-07-21: qty 8, 56d supply, fill #0

## 2023-07-21 MED ORDER — CHLORTHALIDONE 25 MG PO TABS
25.0000 mg | ORAL_TABLET | Freq: Every day | ORAL | 0 refills | Status: DC
Start: 2023-07-21 — End: 2023-09-15
  Filled 2023-07-21: qty 30, 30d supply, fill #0

## 2023-07-21 NOTE — Progress Notes (Signed)
Chief Complaint:   OBESITY Dana Williams is here to discuss her progress with her obesity treatment plan along with follow-up of her obesity related diagnoses. [name] is on the Category 2 Plan or following a lower carbohydrate, vegetable and lean protein rich diet plan and states she is following her eating plan approximately 0% of the time. [name] states she is doing 0 minutes 0 times per week.  Today's visit was #: 37 Starting weight: 236 lbs Starting date: 08/10/2018 Today's weight: 225 lbs Today's date: 07/21/2023 Total lbs lost to date: 11 Total lbs lost since last in-office visit: 8  Interim History: Patient has been on vacation for 2 weeks, but she has done well with weight loss.  She is working on getting back on track with her category 2 plan.  Some of her weight loss was a decrease in water weight due to a new diuretic.  Subjective:   1. Hypertension, essential Patient's blood pressure has improved on her medications and is almost at goal.  She notes some mild diuresis as well.  2. Vitamin D deficiency Patient has been off vitamin D, and she is due for labs.  3. B12 deficiency Patient has been off B12, and she is due for labs.  4. Dyspnea on exertion Patient is now in a sedentary job now and she notes some increase in shortness of breath with exercise.  Assessment/Plan:   1. Hypertension, essential We will check labs today, and we will refill chlorthalidone 25 mg once daily for 1 month.  - chlorthalidone (HYGROTON) 25 MG tablet; Take 1 tablet (25 mg total) by mouth daily.  Dispense: 30 tablet; Refill: 0 - [id number]+EGFR  2. Vitamin D deficiency We will check labs today, and we will refill prescription vitamin D 50,000 IU every 7 days #8 for 2 months.  - VITAMIN D 25 Hydroxy (Vit-D Deficiency, Fractures)  3. B12 deficiency We will check labs today.  Patient agreed to change B12 to oral 1,000 mcg once daily #30, and we will refill for 1 month.  - Vitamin B12  4. Dyspnea  on exertion We will check labs today, we will follow-up at patient's next visit.  - Brain natriuretic peptide  5. BMI 37.0-37.9, adult  6. Obesity, Beginning BMI 39.27 [name] is currently in the action stage of change. As such, her goal is to continue with weight loss efforts. She has agreed to the Category 2 Plan.   Behavioral modification strategies: increasing lean protein intake and meal planning and cooking strategies.  [name] has agreed to follow-up with our clinic in 4 weeks. She was informed of the importance of frequent follow-up visits to maximize her success with intensive lifestyle modifications for her multiple health conditions.   [name] was informed we would discuss her lab results at her next visit unless there is a critical issue that needs to be addressed sooner. [name] agreed to keep her next visit at the agreed upon time to discuss these results.  Objective:   Blood pressure (!) 131/90, pulse (!) 107, temperature 98 F (36.7 C), height 5\' 5"  (1.651 m), weight 225 lb (102.1 kg), SpO2 94%. Body mass index is 37.44 kg/m.  Lab Results  Component Value Date   CREATININE 0.91 03/17/2023   BUN 10 03/17/2023   NA 139 03/17/2023   K 4.1 03/17/2023   CL 107 03/17/2023   CO2 26 03/17/2023   Lab Results  Component Value Date   ALT 22 03/14/2023   AST 22 03/14/2023  ALKPHOS 136 (H) 03/14/2023   BILITOT 0.4 03/14/2023   Lab Results  Component Value Date   HGBA1C 5.5 01/06/2023   HGBA1C 5.3 06/20/2022   HGBA1C 6.0 12/29/2021   HGBA1C 5.8 (H) 05/28/2021   HGBA1C 5.7 (H) 05/28/2020   Lab Results  Component Value Date   INSULIN 13.0 01/06/2023   INSULIN 19.7 05/28/2021   INSULIN 19.4 05/28/2020   INSULIN 14.5 09/25/2019   INSULIN 11.5 12/12/2018   Lab Results  Component Value Date   TSH 1.350 01/06/2023   Lab Results  Component Value Date   CHOL 193 01/06/2023   HDL 83 01/06/2023   LDLCALC 89 01/06/2023   TRIG 120 01/06/2023   Lab Results  Component  Value Date   VD25OH 25.5 (L) 01/06/2023   VD25OH 45.2 05/28/2021   VD25OH 41.0 05/28/2020   Lab Results  Component Value Date   WBC 6.8 03/17/2023   HGB 8.3 (L) 03/17/2023   HCT 27.1 (L) 03/17/2023   MCV 77.2 (L) 03/17/2023   PLT 242 03/17/2023   Lab Results  Component Value Date   IRON 43 01/06/2023   TIBC 489 (H) 01/06/2023   FERRITIN 34 01/06/2023   Attestation Statements:   Reviewed by clinician on day of visit: allergies, medications, problem list, medical history, surgical history, family history, social history, and previous encounter notes.   I, Burt Knack, am acting as transcriptionist for Quillian Quince, MD.  I have reviewed the above documentation for accuracy and completeness, and I agree with the above. -  Quillian Quince, MD

## 2023-07-22 ENCOUNTER — Other Ambulatory Visit (HOSPITAL_COMMUNITY): Payer: Self-pay

## 2023-07-22 ENCOUNTER — Encounter: Payer: Self-pay | Admitting: Pharmacist

## 2023-07-22 ENCOUNTER — Other Ambulatory Visit: Payer: Self-pay

## 2023-07-23 LAB — COMPREHENSIVE METABOLIC PANEL
ALT: 14 IU/L (ref 0–32)
AST: 18 IU/L (ref 0–40)
Albumin: 4.4 g/dL (ref 3.9–4.9)
Alkaline Phosphatase: 188 IU/L — ABNORMAL HIGH (ref 44–121)
BUN/Creatinine Ratio: 16 (ref 12–28)
BUN: 14 mg/dL (ref 8–27)
Bilirubin Total: 0.5 mg/dL (ref 0.0–1.2)
CO2: 26 mmol/L (ref 20–29)
Calcium: 9.9 mg/dL (ref 8.7–10.3)
Chloride: 100 mmol/L (ref 96–106)
Creatinine, Ser: 0.9 mg/dL (ref 0.57–1.00)
Globulin, Total: 3 g/dL (ref 1.5–4.5)
Glucose: 119 mg/dL — ABNORMAL HIGH (ref 70–99)
Potassium: 4.1 mmol/L (ref 3.5–5.2)
Sodium: 142 mmol/L (ref 134–144)
Total Protein: 7.4 g/dL (ref 6.0–8.5)
eGFR: 72 mL/min/{1.73_m2} (ref >59)

## 2023-07-23 LAB — VITAMIN B12: Vitamin B-12: 273 pg/mL (ref 232–1245)

## 2023-07-23 LAB — VITAMIN D 25 HYDROXY (VIT D DEFICIENCY, FRACTURES): Vit D, 25-Hydroxy: 33.4 ng/mL (ref 30.0–100.0)

## 2023-07-23 LAB — BRAIN NATRIURETIC PEPTIDE: BNP: 9.8 pg/mL (ref 0.0–100.0)

## 2023-07-25 ENCOUNTER — Other Ambulatory Visit (HOSPITAL_COMMUNITY): Payer: Self-pay

## 2023-07-25 ENCOUNTER — Encounter (HOSPITAL_COMMUNITY): Payer: Self-pay

## 2023-07-27 ENCOUNTER — Other Ambulatory Visit (HOSPITAL_COMMUNITY): Payer: Self-pay

## 2023-07-27 ENCOUNTER — Other Ambulatory Visit: Payer: Self-pay

## 2023-07-27 MED ORDER — VALACYCLOVIR HCL 500 MG PO TABS
500.0000 mg | ORAL_TABLET | Freq: Every day | ORAL | 5 refills | Status: AC
  Filled 2023-07-27: qty 30, 30d supply, fill #0
  Filled 2023-12-15: qty 30, 30d supply, fill #1

## 2023-08-04 ENCOUNTER — Ambulatory Visit: Payer: Commercial Managed Care - PPO | Admitting: Thoracic Surgery (Cardiothoracic Vascular Surgery)

## 2023-08-04 DIAGNOSIS — K449 Diaphragmatic hernia without obstruction or gangrene: Secondary | ICD-10-CM

## 2023-08-04 DIAGNOSIS — Z09 Encounter for follow-up examination after completed treatment for conditions other than malignant neoplasm: Secondary | ICD-10-CM

## 2023-08-04 DIAGNOSIS — Z9889 Other specified postprocedural states: Secondary | ICD-10-CM

## 2023-08-04 DIAGNOSIS — Z8719 Personal history of other diseases of the digestive system: Secondary | ICD-10-CM

## 2023-08-04 NOTE — Progress Notes (Signed)
     [address]       [name] [address]             [phone]       Patient: Home Provider: Office Consent for Telemedicine visit obtained.  Today's visit was completed via a real-time telehealth (see specific modality noted below). The patient/authorized person provided oral consent at the time of the visit to engage in a telemedicine encounter with the present provider at [facility]. The patient/authorized person was informed of the potential benefits, limitations, and risks of telemedicine. The patient/authorized person expressed understanding that the laws that protect confidentiality also apply to telemedicine. The patient/authorized person acknowledged understanding that telemedicine does not provide emergency services and that he or she would need to call 911 or proceed to the nearest hospital for help if such a need arose.   Total time spent in the clinical discussion 10 minutes.  Telehealth Modality: Phone visit (audio only)  I had a telephone visit with Mrs. Dana Williams.  She is s/p PEH repair.  Overall doing well.  No swallowing, reflux or bleeding issues.  She will follow-up as needed.

## 2023-08-05 ENCOUNTER — Telehealth: Payer: Commercial Managed Care - PPO | Admitting: Thoracic Surgery (Cardiothoracic Vascular Surgery)

## 2023-08-08 ENCOUNTER — Ambulatory Visit (INDEPENDENT_AMBULATORY_CARE_PROVIDER_SITE_OTHER): Payer: Commercial Managed Care - PPO | Admitting: Family Medicine

## 2023-08-13 ENCOUNTER — Other Ambulatory Visit (HOSPITAL_COMMUNITY): Payer: Self-pay

## 2023-08-13 ENCOUNTER — Other Ambulatory Visit (INDEPENDENT_AMBULATORY_CARE_PROVIDER_SITE_OTHER): Payer: Self-pay | Admitting: Family Medicine

## 2023-08-13 DIAGNOSIS — I10 Essential (primary) hypertension: Secondary | ICD-10-CM

## 2023-08-15 ENCOUNTER — Other Ambulatory Visit (HOSPITAL_COMMUNITY): Payer: Self-pay

## 2023-08-18 ENCOUNTER — Other Ambulatory Visit (HOSPITAL_COMMUNITY): Payer: Self-pay

## 2023-08-18 MED ORDER — SERTRALINE HCL 100 MG PO TABS
100.0000 mg | ORAL_TABLET | Freq: Every day | ORAL | 0 refills | Status: DC
  Filled 2023-08-18: qty 90, 90d supply, fill #0

## 2023-08-21 ENCOUNTER — Encounter (HOSPITAL_COMMUNITY): Payer: Self-pay

## 2023-08-22 ENCOUNTER — Ambulatory Visit (INDEPENDENT_AMBULATORY_CARE_PROVIDER_SITE_OTHER): Payer: Commercial Managed Care - PPO | Admitting: Family Medicine

## 2023-08-22 ENCOUNTER — Other Ambulatory Visit (HOSPITAL_COMMUNITY): Payer: Self-pay

## 2023-09-05 ENCOUNTER — Ambulatory Visit (INDEPENDENT_AMBULATORY_CARE_PROVIDER_SITE_OTHER): Payer: Commercial Managed Care - PPO | Admitting: Family Medicine

## 2023-09-09 ENCOUNTER — Other Ambulatory Visit (HOSPITAL_COMMUNITY): Payer: Self-pay

## 2023-09-09 ENCOUNTER — Other Ambulatory Visit (INDEPENDENT_AMBULATORY_CARE_PROVIDER_SITE_OTHER): Payer: Self-pay | Admitting: Family Medicine

## 2023-09-09 DIAGNOSIS — I10 Essential (primary) hypertension: Secondary | ICD-10-CM

## 2023-09-15 ENCOUNTER — Ambulatory Visit (INDEPENDENT_AMBULATORY_CARE_PROVIDER_SITE_OTHER): Payer: Commercial Managed Care - PPO | Admitting: Family Medicine

## 2023-09-15 ENCOUNTER — Encounter: Payer: Self-pay | Admitting: Internal Medicine

## 2023-09-15 ENCOUNTER — Other Ambulatory Visit (HOSPITAL_COMMUNITY): Payer: Self-pay

## 2023-09-15 ENCOUNTER — Other Ambulatory Visit: Payer: Self-pay

## 2023-09-15 ENCOUNTER — Encounter (INDEPENDENT_AMBULATORY_CARE_PROVIDER_SITE_OTHER): Payer: Self-pay | Admitting: Family Medicine

## 2023-09-15 VITALS — BP 121/75 | HR 84 | Temp 98.6°F | Ht 65.0 in | Wt 233.0 lb

## 2023-09-15 DIAGNOSIS — E559 Vitamin D deficiency, unspecified: Secondary | ICD-10-CM | POA: Diagnosis not present

## 2023-09-15 DIAGNOSIS — E538 Deficiency of other specified B group vitamins: Secondary | ICD-10-CM | POA: Diagnosis not present

## 2023-09-15 DIAGNOSIS — Z6838 Body mass index (BMI) 38.0-38.9, adult: Secondary | ICD-10-CM

## 2023-09-15 DIAGNOSIS — E669 Obesity, unspecified: Secondary | ICD-10-CM

## 2023-09-15 DIAGNOSIS — I10 Essential (primary) hypertension: Secondary | ICD-10-CM

## 2023-09-15 MED ORDER — CHLORTHALIDONE 25 MG PO TABS
25.0000 mg | ORAL_TABLET | Freq: Every day | ORAL | 0 refills | Status: AC
  Filled 2023-09-15: qty 30, 30d supply, fill #0

## 2023-09-15 MED ORDER — ERGOCALCIFEROL 1.25 MG (50000 UT) PO CAPS
50000.0000 [IU] | ORAL_CAPSULE | ORAL | 0 refills | Status: AC
  Filled 2023-09-15: qty 8, 56d supply, fill #0

## 2023-09-15 MED ORDER — SYRINGE/NEEDLE (DISP) 18G X 1-1/2" 3 ML MISC
1 refills | Status: AC
  Filled 2023-09-15: qty 5, 35d supply, fill #0

## 2023-09-15 MED ORDER — CYANOCOBALAMIN 1000 MCG/ML IJ SOLN
1000.0000 ug | INTRAMUSCULAR | 0 refills | Status: AC
  Filled 2023-09-15: qty 3, 90d supply, fill #0

## 2023-09-15 NOTE — Progress Notes (Signed)
.smr  Office: [phone]  /  Fax: [phone]  WEIGHT SUMMARY AND BIOMETRICS  Anthropometric Measurements Height: 5\' 5"  (1.651 m) Weight: 233 lb (105.7 kg) BMI (Calculated): 38.77 Weight at Last Visit: 225 lb Weight Lost Since Last Visit: 0 Weight Gained Since Last Visit: 8 lb Starting Weight: 236 lb Total Weight Loss (lbs): 3 lb (1.361 kg)   Body Composition  Body Fat %: 47.5 % Fat Mass (lbs): 111 lbs Muscle Mass (lbs): 116.6 lbs Total Body Water (lbs): 85.8 lbs Visceral Fat Rating : 15   Other Clinical Data Fasting: no Labs: no Today's Visit #: 38 Starting Date: 08/10/18    Chief Complaint: OBESITY   History of Present Illness   The patient, with a history of hypertension, vitamin D deficiency, and obesity, presents for a routine follow-up. Over the past two months, she has gained eight pounds, attributing this to stress and difficulty adhering to her diet plan due to family issues. She reports not currently engaging in regular exercise. Despite these challenges, her hypertension is well-controlled on chlorthalidone, with a blood pressure reading of 121/75 at this visit.  The patient has been dealing with significant family stressors, including assisting her daughter in moving and adjusting to a new job. This has led to some disruption in her dietary habits, with the patient acknowledging she has not been strictly following her diet plan. She expresses readiness to get back on track with her diet and exercise regimen.  The patient has been taking vitamin D supplements and requests a refill of this and her chlorthalidone. She also mentions a previous prescription for a weight loss medication, which she still has at home. She expresses interest in restarting this medication, but this is not confirmed during the visit.  The patient denies any current fluid retention. She has noticed an increase in her blood pressure, which she attributes to her weight gain. She expresses  concern that she may now have hypertension, despite never having been diagnosed with it before. She is aware that her blood pressure is currently controlled due to her medication regimen.  The patient also has a history of vitamin B12 deficiency, previously managed with injections. She expresses a preference for injections over daily pills and requests a refill of her B12 injections. She is comfortable administering these injections herself.  In summary, the patient presents with weight gain and difficulty adhering to her diet plan due to recent family stressors. She expresses readiness to refocus on her health, requesting refills of her current medications and expressing interest in restarting a previous weight loss medication. Her hypertension is well-controlled on her current medication regimen, and she denies any current fluid retention. She also requests a refill of her B12 injections for her vitamin B12 deficiency.          PHYSICAL EXAM:  Blood pressure 121/75, pulse 84, temperature 98.6 F (37 C), height 5\' 5"  (1.651 m), weight 233 lb (105.7 kg), SpO2 96%. Body mass index is 38.77 kg/m.  DIAGNOSTIC DATA REVIEWED:  BMET    Component Value Date/Time   NA 142 07/21/2023 1131   K 4.1 07/21/2023 1131   CL 100 07/21/2023 1131   CO2 26 07/21/2023 1131   GLUCOSE 119 (H) 07/21/2023 1131   GLUCOSE 135 (H) 03/17/2023 0031   BUN 14 07/21/2023 1131   CREATININE 0.90 07/21/2023 1131   CALCIUM 9.9 07/21/2023 1131   GFRNONAA >60 03/17/2023 0031   GFRAA 109 05/28/2020 1613   Lab Results  Component Value Date  HGBA1C 5.5 01/06/2023   HGBA1C 5.4 08/10/2018   Lab Results  Component Value Date   INSULIN 13.0 01/06/2023   INSULIN 10.9 08/10/2018   Lab Results  Component Value Date   TSH 1.350 01/06/2023   CBC    Component Value Date/Time   WBC 6.8 03/17/2023 0031   RBC 3.51 (L) 03/17/2023 0031   HGB 8.3 (L) 03/17/2023 0031   HGB 8.3 (L) 11/05/2022 1330   HGB 12.5 09/25/2019  0935   HCT 27.1 (L) 03/17/2023 0031   HCT 36.3 01/06/2023 1349   PLT 242 03/17/2023 0031   PLT 352 11/05/2022 1330   PLT 249 09/25/2019 0935   MCV 77.2 (L) 03/17/2023 0031   MCV 89 09/25/2019 0935   MCH 23.6 (L) 03/17/2023 0031   MCHC 30.6 03/17/2023 0031   RDW 15.2 03/17/2023 0031   RDW 13.2 09/25/2019 0935   Iron Studies    Component Value Date/Time   IRON 43 01/06/2023 1349   TIBC 489 (H) 01/06/2023 1349   FERRITIN 34 01/06/2023 1349   IRONPCTSAT 9 (LL) 01/06/2023 1349   Lipid Panel     Component Value Date/Time   CHOL 193 01/06/2023 1349   TRIG 120 01/06/2023 1349   HDL 83 01/06/2023 1349   LDLCALC 89 01/06/2023 1349   Hepatic Function Panel     Component Value Date/Time   PROT 7.4 07/21/2023 1131   ALBUMIN 4.4 07/21/2023 1131   AST 18 07/21/2023 1131   ALT 14 07/21/2023 1131   ALKPHOS 188 (H) 07/21/2023 1131   BILITOT 0.5 07/21/2023 1131      Component Value Date/Time   TSH 1.350 01/06/2023 1349   Nutritional Lab Results  Component Value Date   VD25OH 33.4 07/21/2023   VD25OH 25.5 (L) 01/06/2023   VD25OH 45.2 05/28/2021     Assessment and Plan    Hypertension Controlled on Chlorthalidone with a blood pressure of 121/75 today. No reported fluid retention. -Continue Chlorthalidone. Refill prescription.  Vitamin D Deficiency Patient requests refill of Vitamin D. -Refill Vitamin D prescription.  Obesity Weight gain of 8 pounds over the past two months due to stress and poor adherence to diet plan. Patient has expressed readiness to get back on track. -Resume structured meal plan (category 2) with a focus on meal planning, prepping, and eating. -Consider restarting Wegovy (semaglutide) injections at home at a dose of 0.25mg , pending confirmation of dose availability at home via MyChart. -Next appointment in 3-4 weeks to assess progress.  B12 Deficiency Last level was 273, target is 500. -Send in prescription for B12 injections as patient prefers  injections over daily pills.        She was informed of the importance of frequent follow up visits to maximize her success with intensive lifestyle modifications for her multiple health conditions.    Quillian Quince, MD

## 2023-09-16 ENCOUNTER — Other Ambulatory Visit (HOSPITAL_COMMUNITY): Payer: Self-pay

## 2023-09-16 ENCOUNTER — Other Ambulatory Visit: Payer: Self-pay

## 2023-09-16 MED ORDER — ROSUVASTATIN CALCIUM 10 MG PO TABS
10.0000 mg | ORAL_TABLET | Freq: Every day | ORAL | 1 refills | Status: DC
  Filled 2023-09-16: qty 90, 90d supply, fill #0

## 2023-09-20 ENCOUNTER — Other Ambulatory Visit (HOSPITAL_COMMUNITY): Payer: Self-pay

## 2023-09-20 DIAGNOSIS — Z6839 Body mass index (BMI) 39.0-39.9, adult: Secondary | ICD-10-CM | POA: Diagnosis not present

## 2023-09-20 DIAGNOSIS — I10 Essential (primary) hypertension: Secondary | ICD-10-CM | POA: Diagnosis not present

## 2023-09-20 DIAGNOSIS — L409 Psoriasis, unspecified: Secondary | ICD-10-CM | POA: Diagnosis not present

## 2023-09-20 DIAGNOSIS — F3341 Major depressive disorder, recurrent, in partial remission: Secondary | ICD-10-CM | POA: Diagnosis not present

## 2023-09-20 DIAGNOSIS — E7800 Pure hypercholesterolemia, unspecified: Secondary | ICD-10-CM | POA: Diagnosis not present

## 2023-09-20 MED ORDER — CLONIDINE HCL 0.1 MG PO TABS
0.1000 mg | ORAL_TABLET | Freq: Every day | ORAL | 1 refills | Status: AC
  Filled 2023-09-20: qty 90, 90d supply, fill #0
  Filled 2023-10-15 – 2023-12-15 (×2): qty 90, 90d supply, fill #1

## 2023-09-29 NOTE — Progress Notes (Incomplete)
Dana Williams, D.O.  ABFM, ABOM Specializing in Clinical Bariatric Medicine  Office located at: [address] [place], [place]  [address]   Assessment and Plan:   FOR THE DISEASE OF OBESITY:  There are no diagnoses linked to this encounter. ***add obesity and bmi dx here only  Since last office visit on *** patient's  Muscle mass has {DID:29233} by ***lb. Fat mass has {DID:29233} by ***lb. Total body water has {DID:29233} by ***lb.  Counseling done on how various foods will affect these numbers and how to maximize success  Total lbs lost to date: *** Total weight loss percentage to date: ***    Recommended Dietary Goals Dana Williams is currently in the action stage of change. As such, her goal is to continue weight management plan.  She has agreed to: {EMWTLOSSPLAN:29297::"continue current plan"}   Behavioral Intervention We discussed the following Behavioral Modification Strategies today: {EMWMwtlossstrategies:28914::"continue to work on maintaining a reduced calorie state, getting the recommended amount of protein, incorporating whole foods, making healthy choices, staying well hydrated and practicing mindfulness when eating."}.  Additional resources provided today: {EMadditionalresources:29169::"None"}  Evidence-based interventions for health behavior change were utilized today including the discussion of self monitoring techniques, problem-solving barriers and SMART goal setting techniques.   Regarding patient's less desirable eating habits and patterns, we employed the technique of small changes.   Pt will specifically work on: *** for next visit.    Recommended Physical Activity Goals Alanee has been advised to work up to 150 minutes of moderate intensity aerobic activity a week and strengthening exercises 2-3 times per week for cardiovascular health, weight loss maintenance and preservation of muscle mass.   She has agreed to :  {EMEXERCISE:28847::"Think about  enjoyable ways to increase daily physical activity and overcoming barriers to exercise","Increase physical activity in their day and reduce sedentary time (increase NEAT)."}   Pharmacotherapy We discussed various medication options to help Annelle with her weight loss efforts and we both agreed to : {EMagreedrx:29170::"continue with nutritional and behavioral strategies"}   FOR ASSOCIATED CONDITIONS ADDRESSED TODAY:  There are no diagnoses linked to this encounter.   FOLLOW UP: No follow-ups on file.*** She was informed of the importance of frequent follow up visits to maximize her success with intensive lifestyle modifications for her multiple health conditions.  Subjective:   Chief complaint: Obesity Dana Williams is here to discuss her progress with her obesity treatment plan. She is on the {MWMwtlossportion/plan2:23431} and states she is following her eating plan approximately ***% of the time. She states she is exercising *** minutes *** days per week.  Interval History:  Dana Williams is here for a follow up office visit. Since last OV,  ***   Barriers identified: {EMOBESITYBARRIERS:28841::"none"}.   Pharmacotherapy for weight loss: She is currently taking {EMPharmaco:28845}.   Review of Systems:  Pertinent positives were addressed with patient today.  Reviewed by clinician on day of visit: allergies, medications, problem list, medical history, surgical history, family history, social history, and previous encounter notes.  Weight Summary and Biometrics   No data recorded No data recorded ***  No data recorded No data recorded No data recorded No data recorded  Objective:   PHYSICAL EXAM: There were no vitals taken for this visit. There is no height or weight on file to calculate BMI.  General: she is overweight, cooperative and in no acute distress. PSYCH: Has normal mood, affect and thought process.   HEENT: EOMI, sclerae are anicteric. Lungs: Normal breathing  effort,  no conversational dyspnea. Extremities: Moves * 4 Neurologic: A and O * 3, good insight  DIAGNOSTIC DATA REVIEWED: BMET    Component Value Date/Time   NA 142 07/21/2023 1131   K 4.1 07/21/2023 1131   CL 100 07/21/2023 1131   CO2 26 07/21/2023 1131   GLUCOSE 119 (H) 07/21/2023 1131   GLUCOSE 135 (H) 03/17/2023 0031   BUN 14 07/21/2023 1131   CREATININE 0.90 07/21/2023 1131   CALCIUM 9.9 07/21/2023 1131   GFRNONAA >60 03/17/2023 0031   GFRAA 109 05/28/2020 1613   Lab Results  Component Value Date   HGBA1C 5.5 01/06/2023   HGBA1C 5.4 08/10/2018   Lab Results  Component Value Date   INSULIN 13.0 01/06/2023   INSULIN 10.9 08/10/2018   Lab Results  Component Value Date   TSH 1.350 01/06/2023   CBC    Component Value Date/Time   WBC 6.8 03/17/2023 0031   RBC 3.51 (L) 03/17/2023 0031   HGB 8.3 (L) 03/17/2023 0031   HGB 8.3 (L) 11/05/2022 1330   HGB 12.5 09/25/2019 0935   HCT 27.1 (L) 03/17/2023 0031   HCT 36.3 01/06/2023 1349   PLT 242 03/17/2023 0031   PLT 352 11/05/2022 1330   PLT 249 09/25/2019 0935   MCV 77.2 (L) 03/17/2023 0031   MCV 89 09/25/2019 0935   MCH 23.6 (L) 03/17/2023 0031   MCHC 30.6 03/17/2023 0031   RDW 15.2 03/17/2023 0031   RDW 13.2 09/25/2019 0935   Iron Studies    Component Value Date/Time   IRON 43 01/06/2023 1349   TIBC 489 (H) 01/06/2023 1349   FERRITIN 34 01/06/2023 1349   IRONPCTSAT 9 (LL) 01/06/2023 1349   Lipid Panel     Component Value Date/Time   CHOL 193 01/06/2023 1349   TRIG 120 01/06/2023 1349   HDL 83 01/06/2023 1349   LDLCALC 89 01/06/2023 1349   Hepatic Function Panel     Component Value Date/Time   PROT 7.4 07/21/2023 1131   ALBUMIN 4.4 07/21/2023 1131   AST 18 07/21/2023 1131   ALT 14 07/21/2023 1131   ALKPHOS 188 (H) 07/21/2023 1131   BILITOT 0.5 07/21/2023 1131      Component Value Date/Time   TSH 1.350 01/06/2023 1349   Nutritional Lab Results  Component Value Date   VD25OH 33.4  07/21/2023   VD25OH 25.5 (L) 01/06/2023   VD25OH 45.2 05/28/2021    Attestations:   I, ***, acting as a medical scribe for Thomasene Lot, DO., have compiled all relevant documentation for today's office visit on behalf of Thomasene Lot, DO, while in the presence of [employer], DO.  Reviewed by clinician on day of visit: allergies, medications, problem list, medical history, surgical history, family history, social history, and previous encounter notes pertinent to patient's obesity diagnosis. I have spent 40 *** minutes in the care of the patient today including: preparing to see patient (e.g. review and interpretation of tests, old notes ), obtaining and/or reviewing separately obtained history, performing a medically appropriate examination or evaluation, counseling and educating the patient, ordering medications, test or procedures, documenting clinical information in the electronic or other health care record, and independently interpreting results and communicating results to the patient, family, or caregiver   I have reviewed the above documentation for accuracy and completeness, and I agree with the above. Dana Williams, D.O.  The 21st Century Cures Act was signed into law in 2016 which includes the topic of electronic health records.  This provides immediate access to information in MyChart.  This includes consultation notes, operative notes, office notes, lab results and pathology reports.  If you have any questions about what you read please let us know at your next visit so we can discuss your concerns and take corrective action if need be.  We are right here with you.

## 2023-10-03 ENCOUNTER — Ambulatory Visit (INDEPENDENT_AMBULATORY_CARE_PROVIDER_SITE_OTHER): Payer: Commercial Managed Care - PPO | Admitting: Family Medicine

## 2023-10-16 ENCOUNTER — Other Ambulatory Visit (HOSPITAL_COMMUNITY): Payer: Self-pay

## 2023-10-22 ENCOUNTER — Encounter (HOSPITAL_COMMUNITY): Payer: Self-pay

## 2023-10-22 ENCOUNTER — Emergency Department (HOSPITAL_BASED_OUTPATIENT_CLINIC_OR_DEPARTMENT_OTHER): Payer: Commercial Managed Care - PPO | Admitting: Certified Registered Nurse Anesthetist

## 2023-10-22 ENCOUNTER — Emergency Department (HOSPITAL_COMMUNITY): Payer: Commercial Managed Care - PPO | Admitting: Certified Registered Nurse Anesthetist

## 2023-10-22 ENCOUNTER — Other Ambulatory Visit: Payer: Self-pay

## 2023-10-22 ENCOUNTER — Observation Stay (HOSPITAL_COMMUNITY)
Admission: EM | Admit: 2023-10-22 | Discharge: 2023-10-23 | Disposition: A | Discharge status: Home / Self Care | Payer: Commercial Managed Care - PPO | Attending: Emergency Medicine | Admitting: Emergency Medicine

## 2023-10-22 ENCOUNTER — Emergency Department (HOSPITAL_COMMUNITY): Payer: Commercial Managed Care - PPO

## 2023-10-22 ENCOUNTER — Encounter (HOSPITAL_COMMUNITY)
Admission: EM | Disposition: A | Discharge status: Home / Self Care | Payer: Self-pay | Source: Home / Self Care | Attending: Emergency Medicine

## 2023-10-22 ENCOUNTER — Observation Stay (HOSPITAL_COMMUNITY): Payer: Commercial Managed Care - PPO

## 2023-10-22 DIAGNOSIS — E876 Hypokalemia: Secondary | ICD-10-CM

## 2023-10-22 DIAGNOSIS — Z8719 Personal history of other diseases of the digestive system: Secondary | ICD-10-CM

## 2023-10-22 DIAGNOSIS — N23 Unspecified renal colic: Secondary | ICD-10-CM | POA: Diagnosis not present

## 2023-10-22 DIAGNOSIS — E785 Hyperlipidemia, unspecified: Secondary | ICD-10-CM | POA: Diagnosis not present

## 2023-10-22 DIAGNOSIS — N200 Calculus of kidney: Secondary | ICD-10-CM

## 2023-10-22 DIAGNOSIS — Z6839 Body mass index (BMI) 39.0-39.9, adult: Secondary | ICD-10-CM | POA: Diagnosis not present

## 2023-10-22 DIAGNOSIS — N1330 Unspecified hydronephrosis: Secondary | ICD-10-CM | POA: Diagnosis present

## 2023-10-22 DIAGNOSIS — N132 Hydronephrosis with renal and ureteral calculous obstruction: Secondary | ICD-10-CM | POA: Diagnosis not present

## 2023-10-22 DIAGNOSIS — D72829 Elevated white blood cell count, unspecified: Secondary | ICD-10-CM | POA: Diagnosis not present

## 2023-10-22 DIAGNOSIS — Z79899 Other long term (current) drug therapy: Secondary | ICD-10-CM | POA: Insufficient documentation

## 2023-10-22 DIAGNOSIS — R1031 Right lower quadrant pain: Secondary | ICD-10-CM | POA: Diagnosis not present

## 2023-10-22 DIAGNOSIS — N201 Calculus of ureter: Secondary | ICD-10-CM

## 2023-10-22 DIAGNOSIS — K449 Diaphragmatic hernia without obstruction or gangrene: Secondary | ICD-10-CM | POA: Diagnosis not present

## 2023-10-22 DIAGNOSIS — K5730 Diverticulosis of large intestine without perforation or abscess without bleeding: Secondary | ICD-10-CM | POA: Diagnosis not present

## 2023-10-22 DIAGNOSIS — R109 Unspecified abdominal pain: Secondary | ICD-10-CM | POA: Diagnosis not present

## 2023-10-22 DIAGNOSIS — Z96652 Presence of left artificial knee joint: Secondary | ICD-10-CM | POA: Insufficient documentation

## 2023-10-22 HISTORY — PX: CYSTOSCOPY/RETROGRADE/URETEROSCOPY: SHX5316

## 2023-10-22 LAB — COMPREHENSIVE METABOLIC PANEL
ALT: 19 U/L (ref 0–44)
AST: 19 U/L (ref 15–41)
Albumin: 3.9 g/dL (ref 3.5–5.0)
Alkaline Phosphatase: 134 U/L — ABNORMAL HIGH (ref 38–126)
Anion gap: 7 (ref 5–15)
BUN: 18 mg/dL (ref 8–23)
CO2: 27 mmol/L (ref 22–32)
Calcium: 9 mg/dL (ref 8.9–10.3)
Chloride: 103 mmol/L (ref 98–111)
Creatinine, Ser: 0.93 mg/dL (ref 0.44–1.00)
GFR, Estimated: >60 mL/min (ref >60)
Glucose, Bld: 163 mg/dL — ABNORMAL HIGH (ref 70–99)
Potassium: 3.3 mmol/L — ABNORMAL LOW (ref 3.5–5.1)
Sodium: 137 mmol/L (ref 135–145)
Total Bilirubin: 0.4 mg/dL (ref <1.2)
Total Protein: 7.8 g/dL (ref 6.5–8.1)

## 2023-10-22 LAB — CREATININE, SERUM
Creatinine, Ser: 1.05 mg/dL — ABNORMAL HIGH (ref 0.44–1.00)
GFR, Estimated: 60 mL/min — ABNORMAL LOW (ref >60)

## 2023-10-22 LAB — CBC WITH DIFFERENTIAL/PLATELET
Abs Immature Granulocytes: 0.06 10*3/uL (ref 0.00–0.07)
Basophils Absolute: 0.1 10*3/uL (ref 0.0–0.1)
Basophils Relative: 1 %
Eosinophils Absolute: 0.1 10*3/uL (ref 0.0–0.5)
Eosinophils Relative: 0 %
HCT: 39.2 % (ref 36.0–46.0)
Hemoglobin: 12.2 g/dL (ref 12.0–15.0)
Immature Granulocytes: 1 %
Lymphocytes Relative: 10 %
Lymphs Abs: 1.1 10*3/uL (ref 0.7–4.0)
MCH: 26.6 pg (ref 26.0–34.0)
MCHC: 31.1 g/dL (ref 30.0–36.0)
MCV: 85.4 fL (ref 80.0–100.0)
Monocytes Absolute: 0.3 10*3/uL (ref 0.1–1.0)
Monocytes Relative: 3 %
Neutro Abs: 9.6 10*3/uL — ABNORMAL HIGH (ref 1.7–7.7)
Neutrophils Relative %: 85 %
Platelets: 308 10*3/uL (ref 150–400)
RBC: 4.59 MIL/uL (ref 3.87–5.11)
RDW: 14.6 % (ref 11.5–15.5)
WBC: 11.2 10*3/uL — ABNORMAL HIGH (ref 4.0–10.5)
nRBC: 0 % (ref 0.0–0.2)

## 2023-10-22 LAB — HIV ANTIBODY (ROUTINE TESTING W REFLEX): HIV Screen 4th Generation wRfx: NONREACTIVE

## 2023-10-22 LAB — URINALYSIS, ROUTINE W REFLEX MICROSCOPIC
Bilirubin Urine: NEGATIVE
Glucose, UA: NEGATIVE mg/dL
Ketones, ur: NEGATIVE mg/dL
Nitrite: NEGATIVE
Protein, ur: 30 mg/dL — AB
RBC / HPF: >50 RBC/hpf (ref 0–5)
Specific Gravity, Urine: 1.023 (ref 1.005–1.030)
pH: 5 (ref 5.0–8.0)

## 2023-10-22 LAB — MAGNESIUM: Magnesium: 2 mg/dL (ref 1.7–2.4)

## 2023-10-22 LAB — CBC
HCT: 38.2 % (ref 36.0–46.0)
Hemoglobin: 12 g/dL (ref 12.0–15.0)
MCH: 26.7 pg (ref 26.0–34.0)
MCHC: 31.4 g/dL (ref 30.0–36.0)
MCV: 84.9 fL (ref 80.0–100.0)
Platelets: 273 10*3/uL (ref 150–400)
RBC: 4.5 MIL/uL (ref 3.87–5.11)
RDW: 14.6 % (ref 11.5–15.5)
WBC: 8.8 10*3/uL (ref 4.0–10.5)
nRBC: 0 % (ref 0.0–0.2)

## 2023-10-22 LAB — HEMOGLOBIN A1C
Hgb A1c MFr Bld: 5.6 % (ref 4.8–5.6)
Mean Plasma Glucose: 114.02 mg/dL

## 2023-10-22 LAB — LIPASE, BLOOD: Lipase: 28 U/L (ref 11–51)

## 2023-10-22 SURGERY — CYSTOSCOPY/RETROGRADE/URETEROSCOPY
Anesthesia: General | Site: Ureter | Laterality: Bilateral

## 2023-10-22 MED ORDER — POTASSIUM CHLORIDE CRYS ER 20 MEQ PO TBCR
40.0000 meq | EXTENDED_RELEASE_TABLET | Freq: Two times a day (BID) | ORAL | Status: AC
  Administered 2023-10-22: 40 meq via ORAL
  Filled 2023-10-22: qty 2

## 2023-10-22 MED ORDER — ONDANSETRON HCL 4 MG PO TABS: 4.0000 mg | ORAL_TABLET | Freq: Four times a day (QID) | ORAL | Status: DC | PRN

## 2023-10-22 MED ORDER — ONDANSETRON HCL 4 MG/2ML IJ SOLN
INTRAMUSCULAR | Status: AC
  Filled 2023-10-22: qty 2

## 2023-10-22 MED ORDER — CEPHALEXIN 500 MG PO CAPS: 500.0000 mg | ORAL_CAPSULE | Freq: Three times a day (TID) | ORAL | 0 refills | Status: AC

## 2023-10-22 MED ORDER — CARMEX CLASSIC LIP BALM EX OINT
TOPICAL_OINTMENT | CUTANEOUS | Status: DC | PRN
  Administered 2023-10-22: 1 via TOPICAL
  Filled 2023-10-22: qty 10

## 2023-10-22 MED ORDER — LIDOCAINE 2% (20 MG/ML) 5 ML SYRINGE
INTRAMUSCULAR | Status: DC | PRN
  Administered 2023-10-22: 60 mg via INTRAVENOUS

## 2023-10-22 MED ORDER — TAMSULOSIN HCL 0.4 MG PO CAPS: 0.4000 mg | ORAL_CAPSULE | Freq: Every day | ORAL | 0 refills | Status: AC

## 2023-10-22 MED ORDER — SODIUM CHLORIDE 0.9 % IV SOLN
1.0000 g | Freq: Once | INTRAVENOUS | Status: AC
  Administered 2023-10-22: 1 g via INTRAVENOUS
  Filled 2023-10-22: qty 10

## 2023-10-22 MED ORDER — ACETAMINOPHEN 650 MG RE SUPP: 650.0000 mg | Freq: Four times a day (QID) | RECTAL | Status: DC | PRN

## 2023-10-22 MED ORDER — PROPOFOL 500 MG/50ML IV EMUL
INTRAVENOUS | Status: DC | PRN
  Administered 2023-10-22: 150 ug/kg/min via INTRAVENOUS

## 2023-10-22 MED ORDER — ENOXAPARIN SODIUM 40 MG/0.4ML IJ SOSY
40.0000 mg | PREFILLED_SYRINGE | INTRAMUSCULAR | Status: DC
  Administered 2023-10-22: 40 mg via SUBCUTANEOUS
  Filled 2023-10-22: qty 0.4

## 2023-10-22 MED ORDER — HYDROMORPHONE HCL 2 MG/ML IJ SOLN
INTRAMUSCULAR | Status: AC
  Filled 2023-10-22: qty 1

## 2023-10-22 MED ORDER — SERTRALINE HCL 100 MG PO TABS
100.0000 mg | ORAL_TABLET | Freq: Every day | ORAL | Status: DC
  Administered 2023-10-22 – 2023-10-23 (×2): 100 mg via ORAL
  Filled 2023-10-22 (×2): qty 1

## 2023-10-22 MED ORDER — SODIUM CHLORIDE 0.9 % IV SOLN: INTRAVENOUS | Status: AC

## 2023-10-22 MED ORDER — ACETAMINOPHEN 325 MG PO TABS: 650.0000 mg | ORAL_TABLET | Freq: Four times a day (QID) | ORAL | Status: DC | PRN

## 2023-10-22 MED ORDER — LIDOCAINE HCL (PF) 2 % IJ SOLN
INTRAMUSCULAR | Status: AC
  Filled 2023-10-22: qty 5

## 2023-10-22 MED ORDER — ONDANSETRON HCL 4 MG/2ML IJ SOLN: 4.0000 mg | Freq: Four times a day (QID) | INTRAMUSCULAR | Status: DC | PRN

## 2023-10-22 MED ORDER — SCOPOLAMINE 1 MG/3DAYS TD PT72
1.0000 | MEDICATED_PATCH | TRANSDERMAL | Status: DC
  Administered 2023-10-22: 1.5 mg via TRANSDERMAL
  Filled 2023-10-22: qty 1

## 2023-10-22 MED ORDER — POLYETHYLENE GLYCOL 3350 17 G PO PACK: 17.0000 g | PACK | Freq: Every day | ORAL | Status: DC

## 2023-10-22 MED ORDER — HYDROMORPHONE HCL 1 MG/ML IJ SOLN
INTRAMUSCULAR | Status: DC | PRN
  Administered 2023-10-22 (×2): 1 mg via INTRAVENOUS

## 2023-10-22 MED ORDER — ROSUVASTATIN CALCIUM 20 MG PO TABS
20.0000 mg | ORAL_TABLET | Freq: Every day | ORAL | Status: DC
  Administered 2023-10-22 – 2023-10-23 (×2): 20 mg via ORAL
  Filled 2023-10-22: qty 1
  Filled 2023-10-22: qty 2

## 2023-10-22 MED ORDER — TRAMADOL HCL 50 MG PO TABS
50.0000 mg | ORAL_TABLET | Freq: Four times a day (QID) | ORAL | Status: DC | PRN
  Administered 2023-10-22 – 2023-10-23 (×2): 50 mg via ORAL
  Filled 2023-10-22 (×2): qty 1

## 2023-10-22 MED ORDER — ONDANSETRON HCL 4 MG/2ML IJ SOLN
INTRAMUSCULAR | Status: DC | PRN
  Administered 2023-10-22: 4 mg via INTRAVENOUS

## 2023-10-22 MED ORDER — KETOROLAC TROMETHAMINE 15 MG/ML IJ SOLN
15.0000 mg | Freq: Once | INTRAMUSCULAR | Status: AC
  Administered 2023-10-22: 15 mg via INTRAVENOUS
  Filled 2023-10-22: qty 1

## 2023-10-22 MED ORDER — IOHEXOL 300 MG/ML  SOLN
INTRAMUSCULAR | Status: DC | PRN
  Administered 2023-10-22: 20 mL

## 2023-10-22 MED ORDER — HYDROCODONE-ACETAMINOPHEN 5-325 MG PO TABS: 1.0000 | ORAL_TABLET | Freq: Four times a day (QID) | ORAL | 0 refills | Status: AC | PRN

## 2023-10-22 MED ORDER — MIDAZOLAM HCL 5 MG/5ML IJ SOLN
INTRAMUSCULAR | Status: DC | PRN
  Administered 2023-10-22: 2 mg via INTRAVENOUS

## 2023-10-22 MED ORDER — FENTANYL CITRATE (PF) 100 MCG/2ML IJ SOLN
INTRAMUSCULAR | Status: DC | PRN
  Administered 2023-10-22 (×2): 50 ug via INTRAVENOUS

## 2023-10-22 MED ORDER — SODIUM CHLORIDE 0.9 % IR SOLN
Status: DC | PRN
  Administered 2023-10-22: 6000 mL via INTRAVESICAL

## 2023-10-22 MED ORDER — MAGNESIUM OXIDE -MG SUPPLEMENT 400 (240 MG) MG PO TABS
400.0000 mg | ORAL_TABLET | Freq: Two times a day (BID) | ORAL | Status: AC
  Administered 2023-10-22: 400 mg via ORAL
  Filled 2023-10-22: qty 1

## 2023-10-22 MED ORDER — ACETAMINOPHEN 10 MG/ML IV SOLN
INTRAVENOUS | Status: AC
  Filled 2023-10-22: qty 100

## 2023-10-22 MED ORDER — PROPOFOL 500 MG/50ML IV EMUL
INTRAVENOUS | Status: AC
  Filled 2023-10-22: qty 50

## 2023-10-22 MED ORDER — FENTANYL CITRATE (PF) 100 MCG/2ML IJ SOLN
INTRAMUSCULAR | Status: AC
  Filled 2023-10-22: qty 2

## 2023-10-22 MED ORDER — DEXAMETHASONE SODIUM PHOSPHATE 10 MG/ML IJ SOLN
INTRAMUSCULAR | Status: AC
  Filled 2023-10-22: qty 1

## 2023-10-22 MED ORDER — KETOROLAC TROMETHAMINE 30 MG/ML IJ SOLN
30.0000 mg | Freq: Four times a day (QID) | INTRAMUSCULAR | Status: DC | PRN
  Administered 2023-10-22 – 2023-10-23 (×4): 30 mg via INTRAVENOUS
  Filled 2023-10-22 (×5): qty 1

## 2023-10-22 MED ORDER — PROPOFOL 1000 MG/100ML IV EMUL
INTRAVENOUS | Status: AC
  Filled 2023-10-22: qty 100

## 2023-10-22 MED ORDER — PROPOFOL 10 MG/ML IV BOLUS
INTRAVENOUS | Status: AC
  Filled 2023-10-22: qty 20

## 2023-10-22 MED ORDER — PROPOFOL 10 MG/ML IV BOLUS
INTRAVENOUS | Status: DC | PRN
  Administered 2023-10-22: 140 mg via INTRAVENOUS

## 2023-10-22 MED ORDER — MIDAZOLAM HCL 2 MG/2ML IJ SOLN
INTRAMUSCULAR | Status: AC
  Filled 2023-10-22: qty 2

## 2023-10-22 MED ORDER — ACETAMINOPHEN 10 MG/ML IV SOLN
INTRAVENOUS | Status: DC | PRN
  Administered 2023-10-22: 1000 mg via INTRAVENOUS

## 2023-10-22 MED ORDER — SODIUM CHLORIDE 0.9 % IV SOLN
1.0000 g | INTRAVENOUS | Status: DC
  Administered 2023-10-22: 1 g via INTRAVENOUS
  Filled 2023-10-22 (×2): qty 10

## 2023-10-22 MED ORDER — DEXAMETHASONE SODIUM PHOSPHATE 4 MG/ML IJ SOLN
INTRAMUSCULAR | Status: DC | PRN
  Administered 2023-10-22: 5 mg via INTRAVENOUS

## 2023-10-22 SURGICAL SUPPLY — 19 items
BAG URO CATCHER STRL LF (MISCELLANEOUS) ×2 IMPLANT
BASKET ZERO TIP NITINOL 2.4FR (BASKET) IMPLANT
CATH URETL OPEN 5X70 (CATHETERS) ×2 IMPLANT
CLOTH BEACON ORANGE TIMEOUT ST (SAFETY) ×2 IMPLANT
EXTRACTOR STONE 1.7FRX115CM (UROLOGICAL SUPPLIES) IMPLANT
GLOVE BIO SURGEON STRL SZ8 (GLOVE) ×2 IMPLANT
GOWN STRL REUS W/ TWL XL LVL3 (GOWN DISPOSABLE) ×2 IMPLANT
GUIDEWIRE ANG ZIPWIRE 038X150 (WIRE) IMPLANT
GUIDEWIRE STR DUAL SENSOR (WIRE) ×2 IMPLANT
KIT TURNOVER KIT A (KITS) IMPLANT
LASER FIB FLEXIVA PULSE ID 365 (Laser) IMPLANT
MANIFOLD NEPTUNE II (INSTRUMENTS) ×2 IMPLANT
PACK CYSTO (CUSTOM PROCEDURE TRAY) ×2 IMPLANT
SHEATH NAVIGATOR HD 12/14X28 (SHEATH) IMPLANT
SHEATH NAVIGATOR HD 12/14X36 (SHEATH) IMPLANT
STENT URET 6FRX24 CONTOUR (STENTS) IMPLANT
TRACTIP FLEXIVA PULS ID 200XHI (Laser) IMPLANT
TUBING CONNECTING 10 (TUBING) ×2 IMPLANT
TUBING UROLOGY SET (TUBING) ×2 IMPLANT

## 2023-10-22 NOTE — Plan of Care (Signed)
  Problem: Clinical Measurements: Goal: Diagnostic test results will improve Outcome: Progressing   

## 2023-10-22 NOTE — Addendum Note (Signed)
Addendum  created 10/22/23 1449 by Mal Amabile, MD   Clinical Note Signed

## 2023-10-22 NOTE — Anesthesia Procedure Notes (Signed)
Procedure Name: LMA Insertion Date/Time: 10/22/2023 11:20 AM  Performed by: Vanessa [place], CRNAPre-anesthesia Checklist: Emergency Drugs available, Patient identified, Suction available and Patient being monitored Patient Re-evaluated:Patient Re-evaluated prior to induction Oxygen Delivery Method: Circle system utilized Preoxygenation: Pre-oxygenation with 100% oxygen Induction Type: IV induction Ventilation: Mask ventilation without difficulty LMA: LMA inserted LMA Size: 4.0 Number of attempts: 1 Placement Confirmation: positive ETCO2 and breath sounds checked- equal and bilateral Tube secured with: Tape Dental Injury: Teeth and Oropharynx as per pre-operative assessment

## 2023-10-22 NOTE — Discharge Instructions (Addendum)
DISCHARGE INSTRUCTIONS FOR KIDNEY STONE/URETERAL STENT   MEDICATIONS:  1.  Resume all your other meds from home - except do not take any extra narcotic pain meds that you may have at home.  2. Pyridium  3. Tamsulosin  4. Hyosciamine   ACTIVITY:  1. No strenuous activity x 1week  2. No driving while on narcotic pain medications  3. Drink plenty of water  4. Continue to walk at home - it is normal to see blood in the urine while the stent is in place, so keep active, but don't over do it.  5. May return to work/school tomorrow or when you feel ready  6. You may experience some pain when urinating in the kidney on the side that was operated on while the stent is in place this is normal  BATHING:  1. You can shower and we recommend daily showers  2. You have a string coming from your urethra: The stent string is attached to your ureteral stent. Do not pull on this.   SIGNS/SYMPTOMS TO CALL:  Please call us if you have a fever greater than 101.5, uncontrolled nausea/vomiting, uncontrolled pain, dizziness, unable to urinate, bloody urine with clots greater than the size of a quarter, chest pain, shortness of breath, leg swelling, leg pain, redness around wound, drainage from wound, or any other concerns or questions.   You can reach [name] at [phone].   FOLLOW-UP:  1. You have an appointment in 8 weeks with a ultrasound of your kidneys prior.  You have an appointment for stent removal in 2 week.

## 2023-10-22 NOTE — ED Provider Notes (Signed)
[facility] AT [facility] Provider Note   CSN: [id number] Arrival date & time: 10/22/23  0019     History  Chief Complaint  Patient presents with   Flank Pain    Dana Williams is a 62 y.o. female.  The history is provided by the patient.  Patient with previous history of kidney stones presents with back pain and right lower quadrant pain.  Patient reports over the past several days has been having some lower back pain, but that over the past several hours she has had right lower quadrant pain.  She reports it feels similar to previous kidney stones.  She reports urinary frequency.  No fevers or vomiting. 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 Authorizing Provider  cephALEXin (KEFLEX) 500 MG capsule Take 1 capsule (500 mg total) by mouth 3 (three) times daily. 10/22/23  Yes Zadie Rhine, MD  HYDROcodone-acetaminophen (NORCO/VICODIN) 5-325 MG tablet Take 1 tablet by mouth every 6 (six) hours as needed for severe pain (pain score 7-10). 10/22/23  Yes Zadie Rhine, MD  tamsulosin (FLOMAX) 0.4 MG CAPS capsule Take 1 capsule (0.4 mg total) by mouth daily after  supper. 10/22/23  Yes Zadie Rhine, MD  ammonium lactate (LAC-HYDRIN) 12 % lotion Apply on the skin twice a day Patient taking differently: Apply 1 Application topically daily. 12/08/22     calcipotriene-betamethasone (TACLONEX) external suspension Apply to affected area daily Patient taking differently: Apply 1 application  topically daily as needed (psoriasis). 10/05/21     chlorthalidone (HYGROTON) 25 MG tablet Take 1 tablet (25 mg total) by mouth daily. 09/15/23   Quillian Quince D, MD  cloNIDine (CATAPRES) 0.1 MG tablet Take 1 tablet (0.1 mg total) by mouth daily. 09/20/23     cyanocobalamin (VITAMIN B12) 1000 MCG/ML injection Inject 1 mL (1,000 mcg total) into the muscle every 30 (thirty) days. 09/15/23   Quillian Quince D, MD  ergocalciferol (VITAMIN D2) 1.25 MG (50000 UT) capsule Take 1 capsule (50,000 Units total) by mouth every 7 (seven) days. 09/15/23   Quillian Quince D, MD  rosuvastatin (CRESTOR) 10 MG tablet Take 1 tablet by mouth once daily 12/28/21     rosuvastatin (CRESTOR) 10 MG tablet Take 1 tablet (10 mg total) by mouth daily. 09/15/23     sertraline (ZOLOFT) 100 MG tablet Take 1 tablet (100 mg total) by mouth daily. 08/18/23     SYRINGE-NEEDLE, DISP, 3 ML 18G X  1-1/2" 3 ML MISC Use to draw up cyanocobalamin to inject once a week 09/15/23   Quillian Quince D, MD  valACYclovir (VALTREX) 500 MG tablet Take 1 tablet (500 mg total) by mouth daily. 07/27/23         Allergies    Sulfa antibiotics    Review of Systems   Review of Systems  Constitutional:  Negative for fever.  Gastrointestinal:  Negative for vomiting.  Genitourinary:  Positive for frequency.  Musculoskeletal:  Positive for back pain.    Physical Exam Updated Vital Signs BP 114/80 (BP Location: Right Arm)   Pulse 93   Temp 98.3 F (36.8 C) (Oral)   Resp 16   Ht 1.651 m (5\' 5" )   Wt 105.7 kg   SpO2 95%   BMI 38.78 kg/m  Physical Exam CONSTITUTIONAL: Well developed/well nourished, anxious HEAD:  Normocephalic/atraumatic ENMT: Mucous membranes moist NECK: supple no meningeal signs CV: S1/S2 noted, no murmurs/rubs/gallops noted LUNGS: Lungs are clear to auscultation bilaterally, no apparent distress ABDOMEN: soft, nontender, no rebound or guarding, bowel sounds noted throughout abdomen GU:no cva tenderness NEURO: Pt is awake/alert/appropriate, moves all extremitiesx4.  No facial droop.   EXTREMITIES: pulses normal/equal, full ROM SKIN: warm, color normal PSYCH: Anxious  ED Results / Procedures / Treatments   Labs (all labs ordered are listed, but only abnormal results are displayed) Labs Reviewed  COMPREHENSIVE METABOLIC PANEL - Abnormal; Notable for the following components:      Result Value   Potassium 3.3 (*)    Glucose, Bld 163 (*)    Alkaline Phosphatase 134 (*)    All other components within normal limits  CBC WITH DIFFERENTIAL/PLATELET - Abnormal; Notable for the following components:   WBC 11.2 (*)    Neutro Abs 9.6 (*)    All other components within normal limits  URINALYSIS, ROUTINE W REFLEX MICROSCOPIC - Abnormal; Notable for the following components:   APPearance HAZY (*)    Hgb urine dipstick MODERATE (*)    Protein, ur 30 (*)    Leukocytes,Ua MODERATE (*)    Bacteria, UA RARE (*)    All other components within normal limits  LIPASE, BLOOD    EKG None  Radiology CT Renal Stone Study  Result Date: 10/22/2023 CLINICAL DATA:  Abdominal pain, right flank pain EXAM: CT ABDOMEN AND PELVIS WITHOUT CONTRAST TECHNIQUE: Multidetector CT imaging of the abdomen and pelvis was performed following the standard protocol without IV contrast. RADIATION DOSE REDUCTION: This exam was performed according to the departmental dose-optimization program which includes automated exposure control, adjustment of the mA and/or kV according to patient size and/or use of iterative reconstruction technique. COMPARISON:  07/09/2013 FINDINGS: Lower chest: No acute abnormality.  Small  hiatal hernia. Hepatobiliary: No focal liver abnormality is seen. Status post cholecystectomy. No biliary dilatation. Pancreas: No focal abnormality or ductal dilatation. Spleen: No focal abnormality.  Normal size. Adrenals/Urinary Tract: Bilateral moderate to severe hydronephrosis. 6 mm distal left ureteral stone several cm proximal to the UVJ. Two distal right ureteral stones near the UVJ measuring 1 mm, best seen on coronal image 144, series 5. No renal or adrenal mass. Urinary bladder decompressed, grossly unremarkable. Stomach/Bowel: Sigmoid diverticulosis. No active diverticulitis. Stomach and small bowel decompressed, unremarkable. Normal appendix. Vascular/Lymphatic: No evidence of aneurysm or adenopathy. Reproductive: Uterus and adnexa unremarkable.  No mass. Other: No free fluid or free air. Musculoskeletal: No acute bony abnormality. IMPRESSION: 6 mm distal left ureteral stone with moderate to severe hydronephrosis. Two punctate 1 mm  stones in the distal right ureter near the UVJ with moderate to severe right hydronephrosis. Sigmoid diverticulosis. Electronically Signed   By: Charlett Nose M.D.   On: 10/22/2023 03:10    Procedures Procedures    Medications Ordered in ED Medications  ketorolac (TORADOL) 15 MG/ML injection 15 mg (15 mg Intravenous Given 10/22/23 0300)  cefTRIAXone (ROCEPHIN) 1 g in sodium chloride 0.9 % 100 mL IVPB (1 g Intravenous New Bag/Given 10/22/23 0346)    ED Course/ Medical Decision Making/ A&P Clinical Course as of 10/22/23 0454  Sat Oct 22, 2023  1829 Patient presented with back and right sided abdominal pain.  CT imaging reveals bilateral ureteral stones.  Patient is well-appearing without signs of renal failure, but due to bilateral stones this is concerned.  Discussed with Dr. Jennette Bill with urology.  He request admission to the hospitalist service, keep n.p.o. and take to the OR for ureteral stents due to bilateral stones [DW]  [phone] Patient agreeable with plan  [DW]  [phone] Discussed with Dr. Lyda Perone for admission. [DW]    Clinical Course User Index [DW] Zadie Rhine, MD                                 Medical Decision Making Amount and/or Complexity of Data Reviewed Labs: ordered. Radiology: ordered.  Risk Prescription drug management. Decision regarding hospitalization.   This patient presents to the ED for concern of abdominal pain, this involves an extensive number of treatment options, and is a complaint that carries with it a high risk of complications and morbidity.  The differential diagnosis includes but is not limited to cholecystitis, cholelithiasis, pancreatitis, gastritis, peptic ulcer disease, appendicitis, bowel obstruction, bowel perforation, diverticulitis, AAA, ischemic bowel, pyelonephritis, kidney stone    Comorbidities that complicate the patient evaluation: Patient's presentation is complicated by their history of hypertension  Social Determinants of Health: Patient works as a Engineer, civil (consulting) at [employer]  Additional history obtained: Records reviewed  previous imaging studies reviewed  Lab Tests: I Ordered, and personally interpreted labs.  The pertinent results include: Hematuria noted  Imaging Studies ordered: I ordered imaging studies including CT scan renal   I independently visualized and interpreted imaging which showed bilateral ureteral stone I agree with the radiologist interpretation   Medicines ordered and prescription drug management: I ordered medication including Toradol for pain Reevaluation of the patient after these medicines showed that the patient    improved  Critical Interventions:   admission for definitive management of bilateral ureteral stone  Consultations Obtained: I requested consultation with the admitting physician [facility] and consultant urology , and discussed  findings as well as pertinent plan - they recommend: Admit  Reevaluation: After the interventions noted  above, I reevaluated the patient and found that they have :improved  Complexity of problems addressed: Patient's presentation is most consistent with  acute presentation with potential threat to life or bodily function  Disposition: After consideration of the diagnostic results and the patient's response to treatment,  I feel that the patent would benefit from admission   .           Final Clinical Impression(s) / ED Diagnoses Final diagnoses:  Ureteral colic  Kidney stone    Rx / DC Orders ED Discharge Orders          Ordered    tamsulosin (FLOMAX) 0.4 MG CAPS capsule  Daily after supper  10/22/23 0326    cephALEXin (KEFLEX) 500 MG capsule  3 times daily        10/22/23 0326    HYDROcodone-acetaminophen (NORCO/VICODIN) 5-325 MG tablet  Every 6 hours PRN        10/22/23 0353              Zadie Rhine, MD 10/22/23 [phone]

## 2023-10-22 NOTE — Anesthesia Preprocedure Evaluation (Addendum)
Anesthesia Evaluation  Patient identified by MRN, date of birth, ID band Patient awake    Reviewed: Allergy & Precautions, NPO status , Patient's Chart, lab work & pertinent test results, reviewed documented beta blocker date and time   History of Anesthesia Complications (+) PONV and history of anesthetic complications  Airway Mallampati: II  TM Distance: >3 FB     Dental no notable dental hx. (+) Dental Advisory Given   Pulmonary neg pulmonary ROS   Pulmonary exam normal breath sounds clear to auscultation       Cardiovascular hypertension, Pt. on medications Normal cardiovascular exam Rhythm:Regular Rate:Normal     Neuro/Psych  PSYCHIATRIC DISORDERS  Depression     Neuromuscular disease    GI/Hepatic Neg liver ROS, hiatal hernia, PUD,GERD  Medicated,,S/P robotic paraesophageal hernia repair   Endo/Other    Renal/GU Renal diseaseBilateral ureteral calculi  negative genitourinary   Musculoskeletal  (+) Arthritis , Rheumatoid disorders,    Abdominal  (+) + obese  Peds  Hematology  (+) Blood dyscrasia, anemia   Anesthesia Other Findings   Reproductive/Obstetrics                             Anesthesia Physical Anesthesia Plan  ASA: 2 and emergent  Anesthesia Plan: General   Post-op Pain Management: Precedex, Dilaudid IV and Ofirmev IV (intra-op)*   Induction: Intravenous  PONV Risk Score and Plan: 4 or greater and Treatment may vary due to age or medical condition, Midazolam, Ondansetron, Dexamethasone, Scopolamine patch - Pre-op, TIVA and Propofol infusion  Airway Management Planned: LMA  Additional Equipment: None  Intra-op Plan:   Post-operative Plan: Extubation in OR  Informed Consent: I have reviewed the patients History and Physical, chart, labs and discussed the procedure including the risks, benefits and alternatives for the proposed anesthesia with the patient or  authorized representative who has indicated his/her understanding and acceptance.     Dental advisory given  Plan Discussed with: Anesthesiologist and CRNA  Anesthesia Plan Comments:        Anesthesia Quick Evaluation

## 2023-10-22 NOTE — Transfer of Care (Signed)
Immediate Anesthesia Transfer of Care Note  Patient: [name]  Procedure(s) Performed: CYSTOSCOPY/RETROGRADE/URETEROSCOPY, POSSIBLE BILATERAL LASER LITHOTRIPSY, POSSIBLE STENT PLACEMENT (Bilateral)  Patient Location: PACU  Anesthesia Type:General  Level of Consciousness: awake and patient cooperative  Airway & Oxygen Therapy: Patient Spontanous Breathing and Patient connected to face mask  Post-op Assessment: Report given to RN and Post -op Vital signs reviewed and stable  Post vital signs: Reviewed and stable  Last Vitals:  Vitals Value Taken Time  BP 133/84 10/22/23 1227  Temp 36.8 C 10/22/23 1227  Pulse 76 10/22/23 1230  Resp 11 10/22/23 1230  SpO2 98 % 10/22/23 1230  Vitals shown include unfiled device data.  Last Pain:  Vitals:   10/22/23 1227  TempSrc:   PainSc: Asleep      Patients Stated Pain Goal: 0 (10/22/23 0800)  Complications: No notable events documented.

## 2023-10-22 NOTE — Anesthesia Postprocedure Evaluation (Addendum)
Anesthesia Post Note  Patient: Dana Williams  Procedure(s) Performed: CYSTOSCOPY/RETROGRADE/ BILATERAL URETEROSCOPY BILATERAL LASER LITHOTRIPSY BILATERAL STENT PLACEMENT (Bilateral: Ureter)     Patient location during evaluation: PACU Anesthesia Type: General Level of consciousness: awake and alert and oriented Pain management: pain level controlled Vital Signs Assessment: post-procedure vital signs reviewed and stable Respiratory status: spontaneous breathing, nonlabored ventilation and respiratory function stable Cardiovascular status: blood pressure returned to baseline and stable Postop Assessment: no apparent nausea or vomiting Anesthetic complications: no   No notable events documented.                  Kaira Stringfield A.

## 2023-10-22 NOTE — H&P (Signed)
History and Physical  Dana Williams ZOX:[id number] DOB: Apr 02, 1961 DOA: 10/22/2023  PCP: Inez Pilgrim, NP Patient coming from: Home  I have personally briefly reviewed patient's old medical records in [facility]   Chief Complaint: Flank pain  HPI: Dana Williams is a 62 y.o. female past medical history of nephrolithiasis GI bleed due to [facility] lesions in 2023, history of robotic assisted paraesophageal hernia repair with fundoplication, comes into the ED with back pain and right lower quadrant pain that started about 24 hours prior to admission.,  She relates that she is been having some urinary frequency and was trying to stay at home controlling her pain, but over the last several hours prior to admission it was unbearable reports it feels like her previous kidney stones.  Denies any fever chills.  Some nausea no vomiting.  In the ED: Found to be afebrile, with a white count of 12.  Empirically on antibiotics.  Mild hypokalemia CT abdomen and pelvis showed a 6 mm left distal ureteral stone with moderate to severe hydronephrosis and a 1 mm stone in the distal right ureteral UP junction.    Review of Systems: All systems reviewed and apart from history of presenting illness, are negative.  Past Medical History:  Diagnosis Date   Anemia    Arthritis    Arthritis    Arthritis of pelvic region    Depression    Dry skin    Erythema multiforme    Fatigue    GERD (gastroesophageal reflux disease)    H/O psoriatic arthritis    DX  age 89---- in remission for 20 years   History of hiatal hernia    History of kidney stones    Hyperlipidemia    Joint pain    Kidney stones    Obesity    Personal history of colonic polyp - adenoma 01/29/2014   Plantar fasciitis of left foot    PONV (postoperative nausea and vomiting)    Postmenopausal    Psoriasis    Renal calculus, left    Sigmoid diverticulosis    Urticaria    Vitamin D deficiency    Past Surgical History:   Procedure Laterality Date   CLOSED MANIPULATION SHOULDER Bilateral    COLONOSCOPY WITH PROPOFOL  01/25/2014   CYSTO/  BILATERAL RETROGRADE PYELOGRAM/  BILATERAL URETEROSCOPIC LASER LITHOTRIPSY STONE EXTRACTIONS/  BILATERAL STENT PLACEMENT  02/13/2010   CYSTOSCOPY WITH RETROGRADE PYELOGRAM, URETEROSCOPY AND STENT PLACEMENT Left 04/21/2015   Procedure: CYSTOSCOPY WITH RETROGRADE PYELOGRAM, URETEROSCOPY, STONE EXTRACTION AND STENT PLACEMENT;  Surgeon: Marcine Matar, MD;  Location: [facility];  Service: Urology;  Laterality: Left;   CYSTOURETHROSCOPY/  PLACEMENT LYNX SUPRAPUBIC SLING  05/24/2008   ESOPHAGOGASTRODUODENOSCOPY N/A 03/16/2023   Procedure: ESOPHAGOGASTRODUODENOSCOPY (EGD);  Surgeon: Corliss Skains, MD;  Location: [facility] OR;  Service: Thoracic;  Laterality: N/A;   HAND TENDON SURGERY Right 1993   HOLMIUM LASER APPLICATION Left 04/21/2015   Procedure: HOLMIUM LASER APPLICATION;  Surgeon: Marcine Matar, MD;  Location: [facility];  Service: Urology;  Laterality: Left;   LAPAROSCOPIC CHOLECYSTECTOMY  01/31/2002   LEFT URETEROSCOPIC STONE EXTRACTION/  STENT PLACEMENT  10/06/2005   RHINOPLASTY  2007   RHINOPLASTY     STRABISMUS SURGERY Left age 47   TOTAL KNEE ARTHROPLASTY Left 10/27/2016   Procedure: TOTAL KNEE ARTHROPLASTY;  Surgeon: Loreta Ave, MD;  Location: [facility] OR;  Service: Orthopedics;  Laterality: Left;   WISDOM TOOTH EXTRACTION  age 7  XI ROBOTIC ASSISTED PARAESOPHAGEAL HERNIA REPAIR N/A 03/16/2023   Procedure: XI ROBOTIC ASSISTED PARAESOPHAGEAL HERNIA REPAIR WITH FUNDOPLICATION;  Surgeon: Corliss Skains, MD;  Location: [facility] OR;  Service: Thoracic;  Laterality: N/A;   Social History:  reports that she has never smoked. She has never used smokeless tobacco. She reports that she does not drink alcohol and does not use drugs.   Allergies  Allergen Reactions   Sulfa Antibiotics Other (See Comments)    Mouth ulcers    Family  History  Problem Relation Age of Onset   Stomach cancer Paternal Grandmother    Hypertension Mother    Hyperlipidemia Mother    Thyroid disease Mother    Obesity Mother    Colon cancer Neg Hx    Esophageal cancer Neg Hx    Rectal cancer Neg Hx    Allergic rhinitis Neg Hx    Asthma Neg Hx    Eczema Neg Hx    Urticaria Neg Hx      Prior to Admission medications   Medication Sig Start Date End Date Taking? Authorizing Provider  cephALEXin (KEFLEX) 500 MG capsule Take 1 capsule (500 mg total) by mouth 3 (three) times daily. 10/22/23  Yes Zadie Rhine, MD  chlorthalidone (HYGROTON) 25 MG tablet Take 1 tablet (25 mg total) by mouth daily. 09/15/23  Yes Beasley, Caren D, MD  cyanocobalamin (VITAMIN B12) 1000 MCG/ML injection Inject 1 mL (1,000 mcg total) into the muscle every 30 (thirty) days. 09/15/23  Yes Beasley, Caren D, MD  ergocalciferol (VITAMIN D2) 1.25 MG (50000 UT) capsule Take 1 capsule (50,000 Units total) by mouth every 7 (seven) days. 09/15/23  Yes Quillian Quince D, MD  HYDROcodone-acetaminophen (NORCO/VICODIN) 5-325 MG tablet Take 1 tablet by mouth every 6 (six) hours as needed for severe pain (pain score 7-10). 10/22/23  Yes Zadie Rhine, MD  rosuvastatin (CRESTOR) 10 MG tablet Take 1 tablet (10 mg total) by mouth daily. 09/15/23  Yes   sertraline (ZOLOFT) 100 MG tablet Take 1 tablet (100 mg total) by mouth daily. 08/18/23  Yes   tamsulosin (FLOMAX) 0.4 MG CAPS capsule Take 1 capsule (0.4 mg total) by mouth daily after supper. 10/22/23  Yes Zadie Rhine, MD  valACYclovir (VALTREX) 500 MG tablet Take 1 tablet (500 mg total) by mouth daily. Patient taking differently: Take 500 mg by mouth daily as needed (fever blisters). 07/27/23  Yes   ammonium lactate (LAC-HYDRIN) 12 % lotion Apply on the skin twice a day Patient not taking: Reported on 10/22/2023 12/08/22     calcipotriene-betamethasone (TACLONEX) external suspension Apply to affected area daily Patient not taking:  Reported on 10/22/2023 10/05/21     cloNIDine (CATAPRES) 0.1 MG tablet Take 1 tablet (0.1 mg total) by mouth daily. Patient not taking: Reported on 10/22/2023 09/20/23     rosuvastatin (CRESTOR) 10 MG tablet Take 1 tablet by mouth once daily Patient not taking: Reported on 10/22/2023 12/28/21     SYRINGE-NEEDLE, DISP, 3 ML 18G X 1-1/2" 3 ML MISC Use to draw up cyanocobalamin to inject once a week 09/15/23   Quillian Quince D, MD   Physical Exam: Vitals:   10/22/23 0346 10/22/23 0430 10/22/23 0530 10/22/23 0630  BP: 114/80 113/73 105/69 106/74  Pulse: 93 81 76 77  Resp: 16 16 16 16   Temp: 98.3 F (36.8 C)     TempSrc: Oral     SpO2: 95% 96% 94% 97%  Weight:      Height:  General exam: Moderately built and nourished patient, lying comfortably supine. Head, eyes and ENT: Nontraumatic and normocephalic. Neck: Supple. No JVD, carotid bruit or thyromegaly. Lymphatics: No lymphadenopathy. Respiratory system: Clear to auscultation. No increased work of breathing. Cardiovascular system: S1 and S2 heard, RRR. No JVD, murmurs, gallops, clicks or pedal edema. Gastrointestinal system: Abdomen is nondistended, soft and nontender. Normal bowel sounds heard. No organomegaly or masses appreciated. Extremities: Symmetric 5 x 5 power. Peripheral pulses symmetrically felt.  Skin: No rashes or acute findings. Musculoskeletal system: Negative exam. Psychiatry: Pleasant and cooperative.   Labs on Admission:  Basic Metabolic Panel: Recent Labs  Lab 10/22/23 0102  NA 137  K 3.3*  CL 103  CO2 27  GLUCOSE 163*  BUN 18  CREATININE 0.93  CALCIUM 9.0   Liver Function Tests: Recent Labs  Lab 10/22/23 0102  AST 19  ALT 19  ALKPHOS 134*  BILITOT 0.4  PROT 7.8  ALBUMIN 3.9   Recent Labs  Lab 10/22/23 0102  LIPASE 28   No results for input(s): "AMMONIA" in the last 168 hours. CBC: Recent Labs  Lab 10/22/23 0102  WBC 11.2*  NEUTROABS 9.6*  HGB 12.2  HCT 39.2  MCV 85.4  PLT 308    Cardiac Enzymes: No results for input(s): "CKTOTAL", "CKMB", "CKMBINDEX", "TROPONINI" in the last 168 hours.  BNP (last 3 results) No results for input(s): "PROBNP" in the last 8760 hours. CBG: No results for input(s): "GLUCAP" in the last 168 hours.  Radiological Exams on Admission: CT Renal Stone Study  Result Date: 10/22/2023 CLINICAL DATA:  Abdominal pain, right flank pain EXAM: CT ABDOMEN AND PELVIS WITHOUT CONTRAST TECHNIQUE: Multidetector CT imaging of the abdomen and pelvis was performed following the standard protocol without IV contrast. RADIATION DOSE REDUCTION: This exam was performed according to the departmental dose-optimization program which includes automated exposure control, adjustment of the mA and/or kV according to patient size and/or use of iterative reconstruction technique. COMPARISON:  07/09/2013 FINDINGS: Lower chest: No acute abnormality.  Small hiatal hernia. Hepatobiliary: No focal liver abnormality is seen. Status post cholecystectomy. No biliary dilatation. Pancreas: No focal abnormality or ductal dilatation. Spleen: No focal abnormality.  Normal size. Adrenals/Urinary Tract: Bilateral moderate to severe hydronephrosis. 6 mm distal left ureteral stone several cm proximal to the UVJ. Two distal right ureteral stones near the UVJ measuring 1 mm, best seen on coronal image 144, series 5. No renal or adrenal mass. Urinary bladder decompressed, grossly unremarkable. Stomach/Bowel: Sigmoid diverticulosis. No active diverticulitis. Stomach and small bowel decompressed, unremarkable. Normal appendix. Vascular/Lymphatic: No evidence of aneurysm or adenopathy. Reproductive: Uterus and adnexa unremarkable.  No mass. Other: No free fluid or free air. Musculoskeletal: No acute bony abnormality. IMPRESSION: 6 mm distal left ureteral stone with moderate to severe hydronephrosis. Two punctate 1 mm stones in the distal right ureter near the UVJ with moderate to severe right  hydronephrosis. Sigmoid diverticulosis. Electronically Signed   By: Charlett Nose M.D.   On: 10/22/2023 03:10    EKG: Independently reviewed.  None  Assessment/Plan Hydronephrosis of left kidney/bilateral nephrolithiasis: She was given IV ketorolac and her pain significantly improved. She has no further vomiting. She relates she gets really nauseated with vomiting with anesthesia. Urology was notified who recommended n.p.o. for bilateral URS with left lithotripsy  History of gastroesophageal reflux (GERD) with a history on fundoplication and gastropexy: Continue Protonix daily.  Hyperlipidemia Continue statins.  Class 2 severe obesity with serious comorbidity and body mass index (BMI) of 39.0 to  39.9 in adult [facility]) Counseling.  Hypokalemia Replete orally recheck in the morning.  Leukocytosis Likely reactive has remained febrile. Will treat empirically with Rocephin.    DVT Prophylaxis: lovenox Code Status: Full  Family Communication: none  Disposition Plan: inpatient   Time spent: 70 min    Marinda Elk MD [facility]   10/22/2023, 7:33 AM

## 2023-10-22 NOTE — Op Note (Signed)
Preoperative diagnosis: bilateral ureteral calculus  Postoperative diagnosis: bilateral ureteral calculus  Procedure:  Cystoscopy bilateral ureteroscopy and stone removal Ureteroscopic laser lithotripsy bilateral 58F x 24 ureteral stent placement  bilateral retrograde pyelography with interpretation  Surgeon: Dr. Vilma Prader Assistant: Dr. Zettie Pho  Anesthesia: General  Complications: None  Intraoperative findings:  Left ureteral stone severely impacted fragmented and basketed out all fragments were than 2 mm removed. Right stone mildly impacted basketed out after stone manipulation. Bilateral 6 x 24 double-J stents without strings placed in bilateral ureters  Bilateral retrograde pyelogram interpretation: Right retrograde pyelogram: Ureteral narrowing of the distal aspect of the ureter no extravasation or concerns for perforation.  Patent ureter to the renal pelvis on the right side. Left retrograde pyelogram: No significant narrowing patent ureter no perforations noted.  EBL: Minimal  Specimens: bilateral ureteral calculus  Disposition of specimens: [facility] for stone analysis  Indication: Dana Williams is a 62 y.o.   patient with a  bilateral ureteral stone and associated right symptoms, she was also noted to have bilateral hydronephrosis due to bilateral stones it was felt that treatment would be the best option patient was agreeable.. After reviewing the management options for treatment, the patient elected to proceed with the above surgical procedure(s). We have discussed the potential benefits and risks of the procedure, side effects of the proposed treatment, the likelihood of the patient achieving the goals of the procedure, and any potential problems that might occur during the procedure or recuperation. Informed consent has been obtained.   Description of procedure:  The patient was taken to the operating room and general anesthesia was  induced.  The patient was placed in the dorsal lithotomy position, prepped and draped in the usual sterile fashion, and preoperative antibiotics were administered. A preoperative time-out was performed.   Cystourethroscopy was performed.  The patient's urethra was examined and was normal. . Pan cystoscopy was performed and the bladder systematically examined in its entirety. There was no evidence for any bladder tumors, stones, or other mucosal pathology.    Attention then turned to the left ureteral orifice and a ureteral catheter was used to intubate the ureteral orifice.  A 0.38 sensor guidewire was then advanced up the left ureter into the renal pelvis under fluoroscopic guidance. The 4.5 Fr semirigid ureteroscope was then advanced into the ureter next to the guidewire and the calculus was identified.   The stone was then fragmented with the 200 micron holmium laser fiber on a setting of 0.5 and frequency of 5 Hz.  Stone was noted to be significantly impacted and there was significant bleeding after was taken off the wall.  All stones were then basketed out.  After the stone was basketed out using an N gauge nitinol basket a retrograde pyelogram was performed with findings as noted above.  Repeat evaluation the ureter was done no stones greater than 2 mm or less in the ureter.  The guidewire was left in place.  Next attention was turned to the right ureter where sensor wire was placed in the right ureter the 4.5 French semirigid ureter ureteroscope was then advanced to the stone stone was noted in the distal ureter a basket was then used to extract the stone.  Upon extraction the stone was noted to be mildly impacted.  Retrograde pyelogram then performed showed that there is no narrowing in the ureter.  The sensor wires in the left in place.  The patient's right wire was then backloaded through  the cystoscope and a ureteral stent was advance over the wire using Seldinger technique.  The stent was  positioned appropriately under fluoroscopic and cystoscopic guidance.  The wire was then removed with an adequate stent curl noted in the renal pelvis as well as in the bladder.  The same exact technique was then used for the patient's left ureter and renal pelvis for stent placement.  Both stents were left without strings  The bladder was then emptied and the procedure ended.  The patient appeared to tolerate the procedure well and without complications.  The patient was able to be awakened and transferred to the recovery unit in satisfactory condition.   Disposition: The patient will be scheduled for stent removal in 2-3 weeks in our clinic.

## 2023-10-22 NOTE — Consult Note (Cosign Needed)
Urology Consult Note   Requesting Attending Physician:  Marinda Elk, MD Service Providing Consult: Urology  Consulting Attending: Dr. David Stall  Reason for Consult:  Bilateral Obstructing Stones  HPI: Dana Williams is seen in consultation for reasons noted above at the request of Marinda Elk, MD of bilateral ureteral stones  This is a 62 y.o. female with past medical history significant for GERD, depression, prior GI bleed, hyperlipidemia, nephrolithiasis who presents to ED with ongoing back pain and right lower quadrant pain for approximately 24 hours.  ED workup significant for bilateral ureteral stones, 6 distal left ureteral stone and two 1 mm right distal ureteral stones.  Moderate to severe hydronephrosis was noted bilaterally. In ED, patient hemodynamically stable Patient without AKI or concern for infection. White blood cell count not elevated.  Past Medical History: Past Medical History:  Diagnosis Date   Anemia    Arthritis    Arthritis    Arthritis of pelvic region    Depression    Dry skin    Erythema multiforme    Fatigue    GERD (gastroesophageal reflux disease)    H/O psoriatic arthritis    DX  age 19---- in remission for 20 years   History of hiatal hernia    History of kidney stones    Hyperlipidemia    Joint pain    Kidney stones    Obesity    Personal history of colonic polyp - adenoma 01/29/2014   Plantar fasciitis of left foot    PONV (postoperative nausea and vomiting)    Postmenopausal    Psoriasis    Renal calculus, left    Sigmoid diverticulosis    Urticaria    Vitamin D deficiency     Past Surgical History:  Past Surgical History:  Procedure Laterality Date   CLOSED MANIPULATION SHOULDER Bilateral    COLONOSCOPY WITH PROPOFOL  01/25/2014   CYSTO/  BILATERAL RETROGRADE PYELOGRAM/  BILATERAL URETEROSCOPIC LASER LITHOTRIPSY STONE EXTRACTIONS/  BILATERAL STENT PLACEMENT  02/13/2010   CYSTOSCOPY WITH RETROGRADE  PYELOGRAM, URETEROSCOPY AND STENT PLACEMENT Left 04/21/2015   Procedure: CYSTOSCOPY WITH RETROGRADE PYELOGRAM, URETEROSCOPY, STONE EXTRACTION AND STENT PLACEMENT;  Surgeon: Marcine Matar, MD;  Location: [facility];  Service: Urology;  Laterality: Left;   CYSTOURETHROSCOPY/  PLACEMENT LYNX SUPRAPUBIC SLING  05/24/2008   ESOPHAGOGASTRODUODENOSCOPY N/A 03/16/2023   Procedure: ESOPHAGOGASTRODUODENOSCOPY (EGD);  Surgeon: Corliss Skains, MD;  Location: [facility] OR;  Service: Thoracic;  Laterality: N/A;   HAND TENDON SURGERY Right 1993   HOLMIUM LASER APPLICATION Left 04/21/2015   Procedure: HOLMIUM LASER APPLICATION;  Surgeon: Marcine Matar, MD;  Location: [facility];  Service: Urology;  Laterality: Left;   LAPAROSCOPIC CHOLECYSTECTOMY  01/31/2002   LEFT URETEROSCOPIC STONE EXTRACTION/  STENT PLACEMENT  10/06/2005   RHINOPLASTY  2007   RHINOPLASTY     STRABISMUS SURGERY Left age 43   TOTAL KNEE ARTHROPLASTY Left 10/27/2016   Procedure: TOTAL KNEE ARTHROPLASTY;  Surgeon: Loreta Ave, MD;  Location: [facility] OR;  Service: Orthopedics;  Laterality: Left;   WISDOM TOOTH EXTRACTION  age 57   XI ROBOTIC ASSISTED PARAESOPHAGEAL HERNIA REPAIR N/A 03/16/2023   Procedure: XI ROBOTIC ASSISTED PARAESOPHAGEAL HERNIA REPAIR WITH FUNDOPLICATION;  Surgeon: Corliss Skains, MD;  Location: [facility] OR;  Service: Thoracic;  Laterality: N/A;    Medication: Current Facility-Administered Medications  Medication Dose Route Frequency Provider Last Rate Last Admin   0.9 %  sodium chloride infusion   Intravenous Continuous  Marinda Elk, MD       acetaminophen (TYLENOL) tablet 650 mg  650 mg Oral Q6H PRN Marinda Elk, MD       Or   acetaminophen (TYLENOL) suppository 650 mg  650 mg Rectal Q6H PRN Marinda Elk, MD       cefTRIAXone (ROCEPHIN) 1 g in sodium chloride 0.9 % 100 mL IVPB  1 g Intravenous Q24H Marinda Elk, MD       enoxaparin (LOVENOX) injection  40 mg  40 mg Subcutaneous Q24H Marinda Elk, MD       ketorolac (TORADOL) 30 MG/ML injection 30 mg  30 mg Intravenous Q6H PRN Marinda Elk, MD       lip balm (CARMEX) ointment   Topical PRN Marinda Elk, MD       magnesium oxide (MAG-OX) tablet 400 mg  400 mg Oral BID Marinda Elk, MD       ondansetron [facility]) tablet 4 mg  4 mg Oral Q6H PRN Marinda Elk, MD       Or   ondansetron [facility]) injection 4 mg  4 mg Intravenous Q6H PRN Marinda Elk, MD       polyethylene glycol [facility] Ethelene Hal) packet 17 g  17 g Oral Daily Marinda Elk, MD       potassium chloride SA (KLOR-CON M) CR tablet 40 mEq  40 mEq Oral BID Marinda Elk, MD       rosuvastatin (CRESTOR) tablet 20 mg  20 mg Oral Daily Marinda Elk, MD       scopolamine (TRANSDERM-SCOP) 1 MG/3DAYS 1.5 mg  1 patch Transdermal Q72H Marinda Elk, MD   1.5 mg at 10/22/23 0817   sertraline (ZOLOFT) tablet 100 mg  100 mg Oral Daily Marinda Elk, MD        Allergies: Allergies  Allergen Reactions   Sulfa Antibiotics Other (See Comments)    Mouth ulcers    Social History: Social History   Tobacco Use   Smoking status: Never   Smokeless tobacco: Never  Vaping Use   Vaping status: Never Used  Substance Use Topics   Alcohol use: No   Drug use: No    Family History Family History  Problem Relation Age of Onset   Stomach cancer Paternal Grandmother    Hypertension Mother    Hyperlipidemia Mother    Thyroid disease Mother    Obesity Mother    Colon cancer Neg Hx    Esophageal cancer Neg Hx    Rectal cancer Neg Hx    Allergic rhinitis Neg Hx    Asthma Neg Hx    Eczema Neg Hx    Urticaria Neg Hx     Review of Systems 10 systems were reviewed and are negative except as noted specifically in the HPI.  Objective   Vital signs in last 24 hours: BP 125/78 (BP Location: Right Arm)   Pulse 93   Temp 98.1 F (36.7 C)   Resp 16   Ht 5\' 5"   (1.651 m)   Wt 105.7 kg   SpO2 98%   BMI 38.78 kg/m   Physical Exam General: NAD, A&O, resting, appropriate HEENT: [place]/AT, EOMI, MMM Pulmonary: Normal work of breathing Cardiovascular: HDS, adequate peripheral perfusion Abdomen: Soft, NTTP, nondistended. GU: VS, No CVA tenderness Extremities: warm and well perfused Neuro: Appropriate, no focal neurological deficits  Most Recent Labs: Lab Results  Component Value Date   WBC 11.2 (  H) 10/22/2023   HGB 12.2 10/22/2023   HCT 39.2 10/22/2023   PLT 308 10/22/2023    Lab Results  Component Value Date   NA 137 10/22/2023   K 3.3 (L) 10/22/2023   CL 103 10/22/2023   CO2 27 10/22/2023   BUN 18 10/22/2023   CREATININE 0.93 10/22/2023   CALCIUM 9.0 10/22/2023    Lab Results  Component Value Date   INR 1.0 03/14/2023   APTT 26 03/14/2023     Urine Culture: @[id number] (laburin,org,[id number])@   IMAGING: CT Renal Stone Study  Result Date: 10/22/2023 CLINICAL DATA:  Abdominal pain, right flank pain EXAM: CT ABDOMEN AND PELVIS WITHOUT CONTRAST TECHNIQUE: Multidetector CT imaging of the abdomen and pelvis was performed following the standard protocol without IV contrast. RADIATION DOSE REDUCTION: This exam was performed according to the departmental dose-optimization program which includes automated exposure control, adjustment of the mA and/or kV according to patient size and/or use of iterative reconstruction technique. COMPARISON:  07/09/2013 FINDINGS: Lower chest: No acute abnormality.  Small hiatal hernia. Hepatobiliary: No focal liver abnormality is seen. Status post cholecystectomy. No biliary dilatation. Pancreas: No focal abnormality or ductal dilatation. Spleen: No focal abnormality.  Normal size. Adrenals/Urinary Tract: Bilateral moderate to severe hydronephrosis. 6 mm distal left ureteral stone several cm proximal to the UVJ. Two distal right ureteral stones near the UVJ measuring 1 mm, best seen on coronal image 144,  series 5. No renal or adrenal mass. Urinary bladder decompressed, grossly unremarkable. Stomach/Bowel: Sigmoid diverticulosis. No active diverticulitis. Stomach and small bowel decompressed, unremarkable. Normal appendix. Vascular/Lymphatic: No evidence of aneurysm or adenopathy. Reproductive: Uterus and adnexa unremarkable.  No mass. Other: No free fluid or free air. Musculoskeletal: No acute bony abnormality. IMPRESSION: 6 mm distal left ureteral stone with moderate to severe hydronephrosis. Two punctate 1 mm stones in the distal right ureter near the UVJ with moderate to severe right hydronephrosis. Sigmoid diverticulosis. Electronically Signed   By: Charlett Nose M.D.   On: 10/22/2023 03:10    ------  Assessment:  62 y.o. female with past medical history significant for nephrolithiasis who presents to the ED with bilateral obstructing ureteral stones  Reassuringly, patient is hemodynamically stable without concern for infection obstruction.  Other indications for decompression would include bilateral obstruction, which is patient currently has.  Although it is likely that she would pass her right distal stone, would favor more aggressive management with definitive stone treatment at this time.   Recommendations: -Please keep patient n.p.o. -Will plan for bilateral ureteral stone extraction today, 10/22/2023 -Urology will obtain consent at bedside in preop area -Plan for discharge afterwards if patient tolerates procedure well  Dana Lofts, MD Resident Physician [facility]   Thank you for this consult. Please contact the urology consult pager with any further questions/concerns.   We discussed risk benefits alternatives to ureteroscopy.  This included bleeding infection and damage to surrounding structures surrounding structures including ureter as well as urethra.  We discussed the need for stent postoperatively as well as the potential symptoms of stent placement.  We discussed  possible inability to complete procedure due to caliber of your ureter or inability to pass stone possibly requiring long-term stent versus nephrostomy tube.  We discussed need for possible second surgery.  Patient voiced their understanding and consent was obtained.  I was present for the interview and exam I agree with the assessment and plan  Sherle Poe MD

## 2023-10-22 NOTE — ED Triage Notes (Signed)
Pt reports with right flank pain since today. Pt has a hx of kidney stones and reports that it feels the same.

## 2023-10-22 NOTE — Progress Notes (Signed)
38 F w/ hx of nephrolithiasis presented to ER w/ bilateral ureteral stones and bilateral hydro. Cr WNL, VS WNL, and no signs of infection. Pain resolved with toradol. Due to bilateral hydro will plan for URS w/ LL later today.   Previous hx of L URS w/ LL with Dr. Retta Diones.  Indication: bilateral ureteral stone and bilateral hydro   Recommendations: - keep NPO - admit to medicine  - urology will plan for bilateral URS w/ LL

## 2023-10-23 ENCOUNTER — Encounter (HOSPITAL_COMMUNITY): Payer: Self-pay | Admitting: Urology

## 2023-10-23 DIAGNOSIS — N200 Calculus of kidney: Secondary | ICD-10-CM | POA: Diagnosis not present

## 2023-10-23 DIAGNOSIS — N23 Unspecified renal colic: Secondary | ICD-10-CM | POA: Diagnosis not present

## 2023-10-23 DIAGNOSIS — N1330 Unspecified hydronephrosis: Secondary | ICD-10-CM | POA: Diagnosis not present

## 2023-10-23 LAB — COMPREHENSIVE METABOLIC PANEL
ALT: 15 U/L (ref 0–44)
AST: 16 U/L (ref 15–41)
Albumin: 3.3 g/dL — ABNORMAL LOW (ref 3.5–5.0)
Alkaline Phosphatase: 110 U/L (ref 38–126)
Anion gap: 9 (ref 5–15)
BUN: 24 mg/dL — ABNORMAL HIGH (ref 8–23)
CO2: 27 mmol/L (ref 22–32)
Calcium: 9.1 mg/dL (ref 8.9–10.3)
Chloride: 104 mmol/L (ref 98–111)
Creatinine, Ser: 0.95 mg/dL (ref 0.44–1.00)
GFR, Estimated: >60 mL/min (ref >60)
Glucose, Bld: 113 mg/dL — ABNORMAL HIGH (ref 70–99)
Potassium: 3.5 mmol/L (ref 3.5–5.1)
Sodium: 140 mmol/L (ref 135–145)
Total Bilirubin: 0.3 mg/dL (ref <1.2)
Total Protein: 6.8 g/dL (ref 6.5–8.1)

## 2023-10-23 LAB — CBC
HCT: 35.4 % — ABNORMAL LOW (ref 36.0–46.0)
Hemoglobin: 11 g/dL — ABNORMAL LOW (ref 12.0–15.0)
MCH: 26.6 pg (ref 26.0–34.0)
MCHC: 31.1 g/dL (ref 30.0–36.0)
MCV: 85.5 fL (ref 80.0–100.0)
Platelets: 309 10*3/uL (ref 150–400)
RBC: 4.14 MIL/uL (ref 3.87–5.11)
RDW: 14.5 % (ref 11.5–15.5)
WBC: 8.2 10*3/uL (ref 4.0–10.5)
nRBC: 0 % (ref 0.0–0.2)

## 2023-10-23 LAB — MAGNESIUM: Magnesium: 2.3 mg/dL (ref 1.7–2.4)

## 2023-10-23 MED ORDER — TRAMADOL HCL 50 MG PO TABS: 50.0000 mg | ORAL_TABLET | Freq: Four times a day (QID) | ORAL | 0 refills | Status: AC | PRN

## 2023-10-23 MED ORDER — POTASSIUM CHLORIDE CRYS ER 20 MEQ PO TBCR
40.0000 meq | EXTENDED_RELEASE_TABLET | ORAL | Status: AC
  Administered 2023-10-23 (×2): 40 meq via ORAL
  Filled 2023-10-23 (×2): qty 2

## 2023-10-23 MED ORDER — TRAMADOL HCL 50 MG PO TABS
50.0000 mg | ORAL_TABLET | Freq: Four times a day (QID) | ORAL | 0 refills | Status: DC | PRN
  Filled 2023-10-23: qty 10, 3d supply, fill #0

## 2023-10-23 NOTE — Discharge Summary (Signed)
Physician Discharge Summary  Dana Williams QMV:[id number] DOB: [birth year] DOA: 10/22/2023  PCP: Inez Pilgrim, NP  Admit date: 10/22/2023 Discharge date: 10/23/2023  Admitted From: Home Disposition:  Home  Recommendations for Outpatient Follow-up:  Follow up with Urology in 1-2 weeks Please obtain BMP/CBC in one week   Home Health:No Equipment/Devices:None  Discharge Condition:Stable CODE STATUS:Full Diet recommendation: Heart Healthy   Brief/Interim Summary: 62 y.o. female past medical history of nephrolithiasis GI bleed due to [facility] lesions in 2023, history of robotic assisted paraesophageal hernia repair with fundoplication, comes into the ED with back pain and right lower quadrant pain that started about 24 hours prior to admission.,  She relates that she is been having some urinary frequency and was trying to stay at home controlling her pain, but over the last several hours prior to admission it was unbearable reports it feels like her previous kidney stones.  Denies any fever chills.  Some nausea no vomiting.   Discharge Diagnoses:  Principal Problem:   Hydronephrosis of left kidney Active Problems:   History of gastroesophageal reflux (GERD)   Hyperlipidemia   Class 2 severe obesity with serious comorbidity and body mass index (BMI) of 39.0 to 39.9 in adult [facility])   Bilateral nephrolithiasis   Hypokalemia   Leukocytosis   Hydronephrosis, left  Left hydronephrosis with bilateral nephrolithiasis: She was started on IV ketorolac and her pain improved she had no evidence of infection or acute kidney injury. Urology was consulted and due to the pain they performed cystoscopy with bilateral uteroscopy and stone removal with laser lithotripsy on 10/22/2023. Pain was controlled she was sent home on Toradol and oral Keflex she will follow-up with urology as an outpatient.  History of gastroesophageal reflux (GERD) with a history on fundoplication and gastropexy: Continue  Protonix.   Hyperlipidemia Continue statins.  Class 2 severe obesity with serious comorbidity and body mass index (BMI) of 39.0 to 39.9 in adult [facility]) She has been counseled.   Hypokalemia Repleted now resolved.   Leukocytosis Likely reactive has remained febrile. Will go home on Keflex.  Discharge Instructions  Discharge Instructions     Diet - low sodium heart healthy   Complete by: As directed    Increase activity slowly   Complete by: As directed       Allergies as of 10/23/2023       Reactions   Sulfa Antibiotics Other (See Comments)   Mouth ulcers        Medication List     STOP taking these medications    calcipotriene-betamethasone external suspension Commonly known as: Taclonex       TAKE these medications    ammonium lactate 12 % lotion Commonly known as: LAC-HYDRIN Apply on the skin twice a day   B-D 3CC LUER-LOK SYR 18GX1-1/2 18G X 1-1/2" 3 ML Misc Generic drug: SYRINGE-NEEDLE (DISP) 3 ML Use to draw up cyanocobalamin to inject once a week   cephALEXin 500 MG capsule Commonly known as: KEFLEX Take 1 capsule (500 mg total) by mouth 3 (three) times daily.   chlorthalidone 25 MG tablet Commonly known as: HYGROTON Take 1 tablet (25 mg total) by mouth daily.   cloNIDine 0.1 MG tablet Commonly known as: CATAPRES Take 1 tablet (0.1 mg total) by mouth daily.   cyanocobalamin 1000 MCG/ML injection Commonly known as: VITAMIN B12 Inject 1 mL (1,000 mcg total) into the muscle every 30 (thirty) days.   HYDROcodone-acetaminophen 5-325 MG tablet Commonly known as: NORCO/VICODIN Take 1 tablet by  mouth every 6 (six) hours as needed for severe pain (pain score 7-10).   rosuvastatin 10 MG tablet Commonly known as: Crestor Take 1 tablet by mouth once daily   sertraline 100 MG tablet Commonly known as: Zoloft Take 1 tablet (100 mg total) by mouth daily.   tamsulosin 0.4 MG Caps capsule Commonly known as: FLOMAX Take 1 capsule (0.4 mg total)  by mouth daily after supper.   traMADol 50 MG tablet Commonly known as: ULTRAM Take 1 tablet (50 mg total) by mouth every 6 (six) hours as needed for up to 3 days for moderate pain (pain score 4-6).   valACYclovir 500 MG tablet Commonly known as: VALTREX Take 1 tablet (500 mg total) by mouth daily. What changed:  when to take this reasons to take this   Vitamin D (Ergocalciferol) 1.25 MG (50000 UNIT) Caps capsule Commonly known as: DRISDOL Take 1 capsule (50,000 Units total) by mouth every 7 (seven) days.        Allergies  Allergen Reactions   Sulfa Antibiotics Other (See Comments)    Mouth ulcers    Consultations: Urology   Procedures/Studies: DG C-Arm 1-60 Min-No Report  Result Date: 10/22/2023 Fluoroscopy was utilized by the requesting physician.  No radiographic interpretation.   CT Renal Stone Study  Result Date: 10/22/2023 CLINICAL DATA:  Abdominal pain, right flank pain EXAM: CT ABDOMEN AND PELVIS WITHOUT CONTRAST TECHNIQUE: Multidetector CT imaging of the abdomen and pelvis was performed following the standard protocol without IV contrast. RADIATION DOSE REDUCTION: This exam was performed according to the departmental dose-optimization program which includes automated exposure control, adjustment of the mA and/or kV according to patient size and/or use of iterative reconstruction technique. COMPARISON:  07/09/2013 FINDINGS: Lower chest: No acute abnormality.  Small hiatal hernia. Hepatobiliary: No focal liver abnormality is seen. Status post cholecystectomy. No biliary dilatation. Pancreas: No focal abnormality or ductal dilatation. Spleen: No focal abnormality.  Normal size. Adrenals/Urinary Tract: Bilateral moderate to severe hydronephrosis. 6 mm distal left ureteral stone several cm proximal to the UVJ. Two distal right ureteral stones near the UVJ measuring 1 mm, best seen on coronal image 144, series 5. No renal or adrenal mass. Urinary bladder decompressed, grossly  unremarkable. Stomach/Bowel: Sigmoid diverticulosis. No active diverticulitis. Stomach and small bowel decompressed, unremarkable. Normal appendix. Vascular/Lymphatic: No evidence of aneurysm or adenopathy. Reproductive: Uterus and adnexa unremarkable.  No mass. Other: No free fluid or free air. Musculoskeletal: No acute bony abnormality. IMPRESSION: 6 mm distal left ureteral stone with moderate to severe hydronephrosis. Two punctate 1 mm stones in the distal right ureter near the UVJ with moderate to severe right hydronephrosis. Sigmoid diverticulosis. Electronically Signed   By: Charlett Nose M.D.   On: 10/22/2023 03:10   (Echo, Carotid, EGD, Colonoscopy, ERCP)    Subjective: No complaints  Discharge Exam: Vitals:   10/23/23 0240 10/23/23 0534  BP: 103/63 109/62  Pulse: 63 67  Resp: 16 18  Temp: 98.2 F (36.8 C) 98.2 F (36.8 C)  SpO2: 98% 99%   Vitals:   10/22/23 1809 10/22/23 2109 10/23/23 0240 10/23/23 0534  BP: 138/73 122/66 103/63 109/62  Pulse: 79 90 63 67  Resp: 17 16 16 18   Temp: 97.9 F (36.6 C) 98.1 F (36.7 C) 98.2 F (36.8 C) 98.2 F (36.8 C)  TempSrc:  Oral Oral Oral  SpO2: 99% 98% 98% 99%  Weight:      Height:        General: Pt is alert, awake, not  in acute distress Cardiovascular: RRR, S1/S2 +, no rubs, no gallops Respiratory: CTA bilaterally, no wheezing, no rhonchi Abdominal: Soft, NT, ND, bowel sounds + Extremities: no edema, no cyanosis    The results of significant diagnostics from this hospitalization (including imaging, microbiology, ancillary and laboratory) are listed below for reference.     Microbiology: No results found for this or any previous visit (from the past 240 hour(s)).   Labs: BNP (last 3 results) Recent Labs    07/21/23 1131  BNP 9.8   Basic Metabolic Panel: Recent Labs  Lab 10/22/23 0102 10/22/23 1401 10/23/23 0445  NA 137  --  140  K 3.3*  --  3.5  CL 103  --  104  CO2 27  --  27  GLUCOSE 163*  --  113*  BUN  18  --  24*  CREATININE 0.93 1.05* 0.95  CALCIUM 9.0  --  9.1  MG  --  2.0 2.3   Liver Function Tests: Recent Labs  Lab 10/22/23 0102 10/23/23 0445  AST 19 16  ALT 19 15  ALKPHOS 134* 110  BILITOT 0.4 0.3  PROT 7.8 6.8  ALBUMIN 3.9 3.3*   Recent Labs  Lab 10/22/23 0102  LIPASE 28   No results for input(s): "AMMONIA" in the last 168 hours. CBC: Recent Labs  Lab 10/22/23 0102 10/22/23 1401 10/23/23 0445  WBC 11.2* 8.8 8.2  NEUTROABS 9.6*  --   --   HGB 12.2 12.0 11.0*  HCT 39.2 38.2 35.4*  MCV 85.4 84.9 85.5  PLT 308 273 309   Cardiac Enzymes: No results for input(s): "CKTOTAL", "CKMB", "CKMBINDEX", "TROPONINI" in the last 168 hours. BNP: Invalid input(s): "POCBNP" CBG: No results for input(s): "GLUCAP" in the last 168 hours. D-Dimer No results for input(s): "DDIMER" in the last 72 hours. Hgb A1c Recent Labs    10/22/23 1401  HGBA1C 5.6   Lipid Profile No results for input(s): "CHOL", "HDL", "LDLCALC", "TRIG", "CHOLHDL", "LDLDIRECT" in the last 72 hours. Thyroid function studies No results for input(s): "TSH", "T4TOTAL", "T3FREE", "THYROIDAB" in the last 72 hours.  Invalid input(s): "FREET3" Anemia work up No results for input(s): "VITAMINB12", "FOLATE", "FERRITIN", "TIBC", "IRON", "RETICCTPCT" in the last 72 hours. Urinalysis    Component Value Date/Time   COLORURINE YELLOW 10/22/2023 0035   APPEARANCEUR HAZY (A) 10/22/2023 0035   LABSPEC 1.023 10/22/2023 0035   PHURINE 5.0 10/22/2023 0035   GLUCOSEU NEGATIVE 10/22/2023 0035   HGBUR MODERATE (A) 10/22/2023 0035   BILIRUBINUR NEGATIVE 10/22/2023 0035   KETONESUR NEGATIVE 10/22/2023 0035   PROTEINUR 30 (A) 10/22/2023 0035   UROBILINOGEN 0.2 04/21/2015 0623   NITRITE NEGATIVE 10/22/2023 0035   LEUKOCYTESUR MODERATE (A) 10/22/2023 0035   Sepsis Labs Recent Labs  Lab 10/22/23 0102 10/22/23 1401 10/23/23 0445  WBC 11.2* 8.8 8.2   Microbiology No results found for this or any previous visit  (from the past 240 hour(s)).   Time coordinating discharge: Over 35 minutes  SIGNED:   Marinda Elk, MD  [facility] 10/23/2023, 6:53 AM Pager   If 7PM-7AM, please contact night-coverage [email or web address] Password [email or web address]

## 2023-10-23 NOTE — Progress Notes (Signed)
Nurse reviewed discharge instructions with pt. Pt verbalized understanding of discharge instructions, follow up appointments and new medications.  Printed prescription given to pt prior to discharge.

## 2023-10-23 NOTE — TOC Initial Note (Signed)
Transition of Care [facility] - Initial/Assessment Note    Patient Details  Name: Dana Williams MRN: [id number] Date of Birth: [birth year]  Transition of Care [employer]) CM/SW Contact:    Adrian Prows, RN Phone Number: 10/23/2023, 10:30 AM  Clinical Narrative:                 Spoke w/ pt in room; pt says she lives at home; she plans to return at d/c; she identified POC Elmon Kirschner [phone]); pt verified she has PCP and insurance; she denies SDOH risks; pt says she has transportation; she does not have DME, HH services, or home oxygen; no TOC needs.  Expected Discharge Plan: Home/Self Care Barriers to Discharge: No Barriers Identified   Patient Goals and CMS Choice Patient states their goals for this hospitalization and ongoing recovery are:: home CMS Medicare.gov Compare Post Acute Care list provided to:: Patient        Expected Discharge Plan and Services In-house Referral:  (n/a) Discharge Planning Services: CM Consult   Living arrangements for the past 2 months: Single Family Home Expected Discharge Date: 10/23/23               DME Arranged: N/A DME Agency: NA       HH Arranged: NA HH Agency: NA        Prior Living Arrangements/Services Living arrangements for the past 2 months: Single Family Home Lives with:: Self Patient language and need for interpreter reviewed:: Yes Do you feel safe going back to the place where you live?: Yes      Need for Family Participation in Patient Care: Yes (Comment) Care giver support system in place?: Yes (comment)   Criminal Activity/Legal Involvement Pertinent to Current Situation/Hospitalization: No - Comment as needed  Activities of Daily Living   ADL Screening (condition at time of admission) Independently performs ADLs?: Yes (appropriate for developmental age) Is the patient deaf or have difficulty hearing?: No Does the patient have difficulty seeing, even when wearing glasses/contacts?: No Does the patient  have difficulty concentrating, remembering, or making decisions?: No  Permission Sought/Granted Permission sought to share information with : Case Manager Permission granted to share information with : Yes, Verbal Permission Granted  Share Information with NAME: Case Manager     Permission granted to share info w Relationship: Elmon Kirschner (friend) [phone]     Emotional Assessment Appearance:: Appears stated age Attitude/Demeanor/Rapport: Gracious Affect (typically observed): Accepting Orientation: : Oriented to Self, Oriented to Place, Oriented to  Time, Oriented to Situation Alcohol / Substance Use: Not Applicable Psych Involvement: No (comment)  Admission diagnosis:  Kidney stone [N20.0] Ureteral colic [N23] Hydronephrosis, left [N13.30] Ureteral stone with hydronephrosis [N13.2] Patient Active Problem List   Diagnosis Date Noted   Hydronephrosis of left kidney 10/22/2023   Bilateral nephrolithiasis 10/22/2023   Hypokalemia 10/22/2023   Leukocytosis 10/22/2023   Hydronephrosis, left 10/22/2023   B12 deficiency 07/21/2023   Dyspnea on exertion 07/21/2023   Hypertension, essential 06/09/2023   Obesity, Beginning BMI 39.27 06/09/2023   S/P repair of paraesophageal hernia 03/16/2023   Paraesophageal hernia 03/16/2023   Elevated blood pressure reading 01/06/2023   Iron deficiency anemia 11/05/2022   Cameron lesion, chronic 11/04/2022   Large hiatal hernia 11/04/2022   Symptomatic anemia 06/19/2022   BMI 38.0-38.9,adult 06/19/2022   Prediabetes 02/10/2021   Insulin resistance 05/12/2020   History of kidney stones 04/25/2019   History of diverticulosis 04/25/2019   History of gastroesophageal reflux (GERD) 04/25/2019  Depression 04/25/2019   Psoriasis 04/25/2019   Primary osteoarthritis of both knees 04/25/2019   Primary osteoarthritis of both feet 04/25/2019   Vitamin D deficiency 04/17/2019   Class 2 severe obesity with serious comorbidity and body mass index  (BMI) of 39.0 to 39.9 in adult (HCC) 04/17/2019   Adverse food reaction 12/07/2018   Rash and other nonspecific skin eruption 12/06/2018   Primary localized osteoarthritis of left knee 10/27/2016   History of colon polyps 01/29/2014   Hyperlipidemia 04/18/2012   PCP:  Inez Pilgrim, NP Pharmacy:   [facility] [address] [address] Phone: [phone] Fax: [phone]  [id number] [place], [place] - [address] AT [facility] [address] & [facility] [address] [place] [address] Phone: [phone] Fax: [phone]     Social Determinants of Health (SDOH) Social History: SDOH Screenings   Food Insecurity: No Food Insecurity (10/23/2023)  Housing: Low Risk  (10/23/2023)  Transportation Needs: No Transportation Needs (10/23/2023)  Utilities: Not At Risk (10/23/2023)  Depression (PHQ2-9): Low Risk  (10/25/2018)  Tobacco Use: Low Risk  (10/22/2023)   SDOH Interventions: Food Insecurity Interventions: Intervention Not Indicated, Inpatient TOC Housing Interventions: Intervention Not Indicated, Inpatient TOC Transportation Interventions: Intervention Not Indicated, Inpatient TOC Utilities Interventions: Intervention Not Indicated, Inpatient TOC   Readmission Risk Interventions     No data to display

## 2023-10-23 NOTE — Consult Note (Addendum)
Urology Consult Note   Requesting Attending Physician:  Marinda Elk, MD Service Providing Consult: Urology  Consulting Attending: Dr. David Stall  Reason for Consult:  Bilateral Obstructing Stones  HPI: Dana Williams is seen in consultation for reasons noted above at the request of Marinda Elk, MD of bilateral ureteral stones  This is a 62 y.o. female with past medical history significant for GERD, depression, prior GI bleed, hyperlipidemia, nephrolithiasis who presents to ED with ongoing back pain and right lower quadrant pain for approximately 24 hours.  ED workup significant for bilateral ureteral stones, 6 distal left ureteral stone and two 1 mm right distal ureteral stones.  Moderate to severe hydronephrosis was noted bilaterally. In ED, patient hemodynamically stable Patient without AKI or concern for infection. White blood cell count not elevated.  Past Medical History: Past Medical History:  Diagnosis Date   Anemia    Arthritis    Arthritis    Arthritis of pelvic region    Depression    Dry skin    Erythema multiforme    Fatigue    GERD (gastroesophageal reflux disease)    H/O psoriatic arthritis    DX  age 52---- in remission for 20 years   History of hiatal hernia    History of kidney stones    Hyperlipidemia    Joint pain    Kidney stones    Obesity    Personal history of colonic polyp - adenoma 01/29/2014   Plantar fasciitis of left foot    PONV (postoperative nausea and vomiting)    Postmenopausal    Psoriasis    Renal calculus, left    Sigmoid diverticulosis    Urticaria    Vitamin D deficiency     Past Surgical History:  Past Surgical History:  Procedure Laterality Date   CLOSED MANIPULATION SHOULDER Bilateral    COLONOSCOPY WITH PROPOFOL  01/25/2014   CYSTO/  BILATERAL RETROGRADE PYELOGRAM/  BILATERAL URETEROSCOPIC LASER LITHOTRIPSY STONE EXTRACTIONS/  BILATERAL STENT PLACEMENT  02/13/2010   CYSTOSCOPY WITH RETROGRADE  PYELOGRAM, URETEROSCOPY AND STENT PLACEMENT Left 04/21/2015   Procedure: CYSTOSCOPY WITH RETROGRADE PYELOGRAM, URETEROSCOPY, STONE EXTRACTION AND STENT PLACEMENT;  Surgeon: Marcine Matar, MD;  Location: [facility];  Service: Urology;  Laterality: Left;   CYSTOSCOPY/RETROGRADE/URETEROSCOPY Bilateral 10/22/2023   Procedure: CYSTOSCOPY/RETROGRADE/ BILATERAL URETEROSCOPY BILATERAL LASER LITHOTRIPSY BILATERAL STENT PLACEMENT;  Surgeon: Adonis Brook, MD;  Location: [facility];  Service: Urology;  Laterality: Bilateral;   CYSTOURETHROSCOPY/  PLACEMENT LYNX SUPRAPUBIC SLING  05/24/2008   ESOPHAGOGASTRODUODENOSCOPY N/A 03/16/2023   Procedure: ESOPHAGOGASTRODUODENOSCOPY (EGD);  Surgeon: Corliss Skains, MD;  Location: [facility] OR;  Service: Thoracic;  Laterality: N/A;   HAND TENDON SURGERY Right 1993   HOLMIUM LASER APPLICATION Left 04/21/2015   Procedure: HOLMIUM LASER APPLICATION;  Surgeon: Marcine Matar, MD;  Location: [facility];  Service: Urology;  Laterality: Left;   LAPAROSCOPIC CHOLECYSTECTOMY  01/31/2002   LEFT URETEROSCOPIC STONE EXTRACTION/  STENT PLACEMENT  10/06/2005   RHINOPLASTY  2007   RHINOPLASTY     STRABISMUS SURGERY Left age 12   TOTAL KNEE ARTHROPLASTY Left 10/27/2016   Procedure: TOTAL KNEE ARTHROPLASTY;  Surgeon: Loreta Ave, MD;  Location: [facility] OR;  Service: Orthopedics;  Laterality: Left;   WISDOM TOOTH EXTRACTION  age 27   XI ROBOTIC ASSISTED PARAESOPHAGEAL HERNIA REPAIR N/A 03/16/2023   Procedure: XI ROBOTIC ASSISTED PARAESOPHAGEAL HERNIA REPAIR WITH FUNDOPLICATION;  Surgeon: Corliss Skains, MD;  Location: [facility] OR;  Service:  Thoracic;  Laterality: N/A;    Medication: Current Facility-Administered Medications  Medication Dose Route Frequency Provider Last Rate Last Admin   0.9 %  sodium chloride infusion   Intravenous Continuous Marinda Elk, MD   Stopped at 10/23/23 0802   acetaminophen (TYLENOL) tablet 650 mg  650 mg Oral  Q6H PRN Marinda Elk, MD       Or   acetaminophen (TYLENOL) suppository 650 mg  650 mg Rectal Q6H PRN Marinda Elk, MD       cefTRIAXone (ROCEPHIN) 1 g in sodium chloride 0.9 % 100 mL IVPB  1 g Intravenous Q24H Marinda Elk, MD   Stopped at 10/22/23 2228   enoxaparin (LOVENOX) injection 40 mg  40 mg Subcutaneous Q24H Marinda Elk, MD   40 mg at 10/22/23 1840   ketorolac (TORADOL) 30 MG/ML injection 30 mg  30 mg Intravenous Q6H PRN Marinda Elk, MD   30 mg at 10/23/23 0746   lip balm (CARMEX) ointment   Topical PRN Marinda Elk, MD   1 Application at 10/22/23 0903   magnesium oxide (MAG-OX) tablet 400 mg  400 mg Oral BID Marinda Elk, MD   400 mg at 10/22/23 2154   ondansetron (ZOFRAN) tablet 4 mg  4 mg Oral Q6H PRN Marinda Elk, MD       Or   ondansetron [facility]) injection 4 mg  4 mg Intravenous Q6H PRN Marinda Elk, MD       polyethylene glycol [facility] Ethelene Hal) packet 17 g  17 g Oral Daily Marinda Elk, MD       potassium chloride SA (KLOR-CON M) CR tablet 40 mEq  40 mEq Oral BID Marinda Elk, MD   40 mEq at 10/22/23 2155   potassium chloride SA (KLOR-CON M) CR tablet 40 mEq  40 mEq Oral Q2H Marinda Elk, MD   40 mEq at 10/23/23 0756   rosuvastatin (CRESTOR) tablet 20 mg  20 mg Oral Daily Marinda Elk, MD   20 mg at 10/22/23 1840   scopolamine (TRANSDERM-SCOP) 1 MG/3DAYS 1.5 mg  1 patch Transdermal Q72H Marinda Elk, MD   1.5 mg at 10/22/23 0817   sertraline (ZOLOFT) tablet 100 mg  100 mg Oral Daily Marinda Elk, MD   100 mg at 10/22/23 1840   traMADol (ULTRAM) tablet 50 mg  50 mg Oral Q6H PRN Marinda Elk, MD   50 mg at 10/23/23 0241    Allergies: Allergies  Allergen Reactions   Sulfa Antibiotics Other (See Comments)    Mouth ulcers    Social History: Social History   Tobacco Use   Smoking status: Never   Smokeless tobacco: Never  Vaping Use   Vaping  status: Never Used  Substance Use Topics   Alcohol use: No   Drug use: No    Family History Family History  Problem Relation Age of Onset   Stomach cancer Paternal Grandmother    Hypertension Mother    Hyperlipidemia Mother    Thyroid disease Mother    Obesity Mother    Colon cancer Neg Hx    Esophageal cancer Neg Hx    Rectal cancer Neg Hx    Allergic rhinitis Neg Hx    Asthma Neg Hx    Eczema Neg Hx    Urticaria Neg Hx     Review of Systems 10 systems were reviewed and are negative except as noted specifically in the HPI.  Objective   Vital signs in last 24 hours: BP 109/62 (BP Location: Right Arm)   Pulse 67   Temp 98.2 F (36.8 C) (Oral)   Resp 18   Ht 5\' 5"  (1.651 m)   Wt 105.7 kg   SpO2 99%   BMI 38.78 kg/m   Physical Exam General: NAD, A&O, resting, appropriate HEENT: [place]/AT, EOMI, MMM Pulmonary: Normal work of breathing Cardiovascular: HDS, adequate peripheral perfusion Abdomen: Soft, NTTP, nondistended. GU: VS, No CVA tenderness Extremities: warm and well perfused Neuro: Appropriate, no focal neurological deficits  Most Recent Labs: Lab Results  Component Value Date   WBC 8.2 10/23/2023   HGB 11.0 (L) 10/23/2023   HCT 35.4 (L) 10/23/2023   PLT 309 10/23/2023    Lab Results  Component Value Date   NA 140 10/23/2023   K 3.5 10/23/2023   CL 104 10/23/2023   CO2 27 10/23/2023   BUN 24 (H) 10/23/2023   CREATININE 0.95 10/23/2023   CALCIUM 9.1 10/23/2023   MG 2.3 10/23/2023    Lab Results  Component Value Date   INR 1.0 03/14/2023   APTT 26 03/14/2023     Urine Culture: @[id number] (laburin,org,[id number],[id number])@   IMAGING: DG C-Arm 1-60 Min-No Report  Result Date: 10/22/2023 Fluoroscopy was utilized by the requesting physician.  No radiographic interpretation.   CT Renal Stone Study  Result Date: 10/22/2023 CLINICAL DATA:  Abdominal pain, right flank pain EXAM: CT ABDOMEN AND PELVIS WITHOUT CONTRAST TECHNIQUE: Multidetector CT  imaging of the abdomen and pelvis was performed following the standard protocol without IV contrast. RADIATION DOSE REDUCTION: This exam was performed according to the departmental dose-optimization program which includes automated exposure control, adjustment of the mA and/or kV according to patient size and/or use of iterative reconstruction technique. COMPARISON:  07/09/2013 FINDINGS: Lower chest: No acute abnormality.  Small hiatal hernia. Hepatobiliary: No focal liver abnormality is seen. Status post cholecystectomy. No biliary dilatation. Pancreas: No focal abnormality or ductal dilatation. Spleen: No focal abnormality.  Normal size. Adrenals/Urinary Tract: Bilateral moderate to severe hydronephrosis. 6 mm distal left ureteral stone several cm proximal to the UVJ. Two distal right ureteral stones near the UVJ measuring 1 mm, best seen on coronal image 144, series 5. No renal or adrenal mass. Urinary bladder decompressed, grossly unremarkable. Stomach/Bowel: Sigmoid diverticulosis. No active diverticulitis. Stomach and small bowel decompressed, unremarkable. Normal appendix. Vascular/Lymphatic: No evidence of aneurysm or adenopathy. Reproductive: Uterus and adnexa unremarkable.  No mass. Other: No free fluid or free air. Musculoskeletal: No acute bony abnormality. IMPRESSION: 6 mm distal left ureteral stone with moderate to severe hydronephrosis. Two punctate 1 mm stones in the distal right ureter near the UVJ with moderate to severe right hydronephrosis. Sigmoid diverticulosis. Electronically Signed   By: Charlett Nose M.D.   On: 10/22/2023 03:10    ------  Assessment:  62 y.o. female with past medical history significant for nephrolithiasis who presents to the ED with bilateral obstructing ureteral stones now s/p bilateral USE.   Update 10/23/2023 Pt POD1 BL USE NAEON, HDS Tolerating PO Feels much improved on exam today.  Voiding spontaneously  Recommendations: -discharge orders placed  -pt  okay to discharge from urologic standpoint -pt will be contacted to schedule follow-up with stent removal  Roby Lofts, MD Resident Physician [facility]  I was present for the interview and exam I agree with the assessment and plan  Sherle Poe MD

## 2023-10-24 ENCOUNTER — Other Ambulatory Visit: Payer: Self-pay

## 2023-11-01 ENCOUNTER — Ambulatory Visit (INDEPENDENT_AMBULATORY_CARE_PROVIDER_SITE_OTHER): Payer: Commercial Managed Care - PPO | Admitting: Family Medicine

## 2023-11-07 DIAGNOSIS — N1330 Unspecified hydronephrosis: Secondary | ICD-10-CM | POA: Diagnosis not present

## 2023-12-15 ENCOUNTER — Other Ambulatory Visit: Payer: Self-pay

## 2023-12-22 DIAGNOSIS — E7800 Pure hypercholesterolemia, unspecified: Secondary | ICD-10-CM | POA: Diagnosis not present

## 2023-12-22 DIAGNOSIS — I10 Essential (primary) hypertension: Secondary | ICD-10-CM | POA: Diagnosis not present

## 2023-12-22 DIAGNOSIS — Z6838 Body mass index (BMI) 38.0-38.9, adult: Secondary | ICD-10-CM | POA: Diagnosis not present

## 2023-12-22 DIAGNOSIS — F3341 Major depressive disorder, recurrent, in partial remission: Secondary | ICD-10-CM | POA: Diagnosis not present

## 2023-12-22 DIAGNOSIS — E538 Deficiency of other specified B group vitamins: Secondary | ICD-10-CM | POA: Diagnosis not present

## 2023-12-23 ENCOUNTER — Other Ambulatory Visit (HOSPITAL_COMMUNITY): Payer: Self-pay

## 2023-12-23 ENCOUNTER — Encounter: Payer: Self-pay | Admitting: Internal Medicine

## 2023-12-23 MED ORDER — CYANOCOBALAMIN 1000 MCG/ML IJ SOLN
1000.0000 ug | INTRAMUSCULAR | 1 refills | Status: AC
  Filled 2023-12-23: qty 3, 90d supply, fill #0

## 2023-12-23 MED ORDER — SYRINGE/NEEDLE (DISP) 25G X 5/8" 3 ML MISC
1 refills | Status: AC
  Filled 2023-12-23: qty 3, 90d supply, fill #0

## 2023-12-26 DIAGNOSIS — B351 Tinea unguium: Secondary | ICD-10-CM | POA: Diagnosis not present

## 2023-12-26 DIAGNOSIS — L608 Other nail disorders: Secondary | ICD-10-CM | POA: Diagnosis not present

## 2023-12-26 DIAGNOSIS — L400 Psoriasis vulgaris: Secondary | ICD-10-CM | POA: Diagnosis not present

## 2023-12-26 DIAGNOSIS — Z85828 Personal history of other malignant neoplasm of skin: Secondary | ICD-10-CM | POA: Diagnosis not present

## 2023-12-26 DIAGNOSIS — Z79899 Other long term (current) drug therapy: Secondary | ICD-10-CM | POA: Diagnosis not present

## 2024-01-18 DIAGNOSIS — N1330 Unspecified hydronephrosis: Secondary | ICD-10-CM | POA: Diagnosis not present

## 2024-01-18 DIAGNOSIS — N201 Calculus of ureter: Secondary | ICD-10-CM | POA: Diagnosis not present

## 2024-02-22 DIAGNOSIS — D2271 Melanocytic nevi of right lower limb, including hip: Secondary | ICD-10-CM | POA: Diagnosis not present

## 2024-02-22 DIAGNOSIS — L853 Xerosis cutis: Secondary | ICD-10-CM | POA: Diagnosis not present

## 2024-02-22 DIAGNOSIS — L400 Psoriasis vulgaris: Secondary | ICD-10-CM | POA: Diagnosis not present

## 2024-02-22 DIAGNOSIS — Z85828 Personal history of other malignant neoplasm of skin: Secondary | ICD-10-CM | POA: Diagnosis not present

## 2024-02-22 DIAGNOSIS — L821 Other seborrheic keratosis: Secondary | ICD-10-CM | POA: Diagnosis not present

## 2024-02-22 DIAGNOSIS — L603 Nail dystrophy: Secondary | ICD-10-CM | POA: Diagnosis not present

## 2024-02-22 DIAGNOSIS — L814 Other melanin hyperpigmentation: Secondary | ICD-10-CM | POA: Diagnosis not present

## 2024-02-22 DIAGNOSIS — D1801 Hemangioma of skin and subcutaneous tissue: Secondary | ICD-10-CM | POA: Diagnosis not present

## 2024-03-15 ENCOUNTER — Other Ambulatory Visit: Payer: Self-pay

## 2024-03-15 ENCOUNTER — Encounter: Payer: Self-pay | Admitting: Internal Medicine

## 2024-03-15 ENCOUNTER — Other Ambulatory Visit (HOSPITAL_COMMUNITY): Payer: Self-pay

## 2024-03-15 MED ORDER — CLOBETASOL PROPIONATE 0.05 % EX GEL
CUTANEOUS | 2 refills | Status: AC
  Filled 2024-03-15: qty 30, 30d supply, fill #0

## 2024-03-16 ENCOUNTER — Other Ambulatory Visit (HOSPITAL_COMMUNITY): Payer: Self-pay

## 2024-03-16 DIAGNOSIS — N201 Calculus of ureter: Secondary | ICD-10-CM | POA: Diagnosis not present

## 2024-03-16 DIAGNOSIS — K449 Diaphragmatic hernia without obstruction or gangrene: Secondary | ICD-10-CM | POA: Diagnosis not present

## 2024-03-16 DIAGNOSIS — R8271 Bacteriuria: Secondary | ICD-10-CM | POA: Diagnosis not present

## 2024-03-16 DIAGNOSIS — R932 Abnormal findings on diagnostic imaging of liver and biliary tract: Secondary | ICD-10-CM | POA: Diagnosis not present

## 2024-03-16 DIAGNOSIS — N132 Hydronephrosis with renal and ureteral calculous obstruction: Secondary | ICD-10-CM | POA: Diagnosis not present

## 2024-03-16 DIAGNOSIS — K5730 Diverticulosis of large intestine without perforation or abscess without bleeding: Secondary | ICD-10-CM | POA: Diagnosis not present

## 2024-03-16 MED ORDER — HYDROCODONE-ACETAMINOPHEN 5-325 MG PO TABS
1.0000 | ORAL_TABLET | Freq: Four times a day (QID) | ORAL | 0 refills | Status: AC | PRN
  Filled 2024-03-16: qty 15, 4d supply, fill #0

## 2024-03-16 MED ORDER — KETOROLAC TROMETHAMINE 10 MG PO TABS
10.0000 mg | ORAL_TABLET | Freq: Three times a day (TID) | ORAL | 0 refills | Status: AC | PRN
  Filled 2024-03-16: qty 15, 5d supply, fill #0

## 2024-03-16 MED ORDER — ONDANSETRON 8 MG PO TBDP
8.0000 mg | ORAL_TABLET | Freq: Three times a day (TID) | ORAL | 0 refills | Status: AC | PRN
  Filled 2024-03-16: qty 15, 5d supply, fill #0

## 2024-03-16 MED ORDER — TAMSULOSIN HCL 0.4 MG PO CAPS
0.4000 mg | ORAL_CAPSULE | Freq: Every day | ORAL | 3 refills | Status: AC
  Filled 2024-03-16: qty 30, 30d supply, fill #0

## 2024-03-20 ENCOUNTER — Encounter: Payer: Self-pay | Admitting: Internal Medicine

## 2024-03-20 ENCOUNTER — Other Ambulatory Visit (HOSPITAL_COMMUNITY): Payer: Self-pay

## 2024-03-21 ENCOUNTER — Other Ambulatory Visit (HOSPITAL_COMMUNITY): Payer: Self-pay

## 2024-03-21 ENCOUNTER — Other Ambulatory Visit: Payer: Self-pay

## 2024-03-21 MED ORDER — SERTRALINE HCL 100 MG PO TABS
100.0000 mg | ORAL_TABLET | Freq: Every day | ORAL | 1 refills | Status: AC
  Filled 2024-03-21: qty 90, 90d supply, fill #0

## 2024-03-22 ENCOUNTER — Other Ambulatory Visit (HOSPITAL_COMMUNITY): Payer: Self-pay

## 2024-03-29 ENCOUNTER — Other Ambulatory Visit (HOSPITAL_COMMUNITY): Payer: Self-pay

## 2024-03-29 DIAGNOSIS — N201 Calculus of ureter: Secondary | ICD-10-CM | POA: Diagnosis not present

## 2024-03-29 DIAGNOSIS — R8271 Bacteriuria: Secondary | ICD-10-CM | POA: Diagnosis not present

## 2024-03-29 MED ORDER — KETOROLAC TROMETHAMINE 10 MG PO TABS
10.0000 mg | ORAL_TABLET | Freq: Three times a day (TID) | ORAL | 0 refills | Status: AC
  Filled 2024-03-29: qty 15, 5d supply, fill #0
  Filled 2024-03-29: qty 5, 2d supply, fill #0

## 2024-03-30 DIAGNOSIS — H5213 Myopia, bilateral: Secondary | ICD-10-CM | POA: Diagnosis not present

## 2024-04-11 ENCOUNTER — Encounter (HOSPITAL_COMMUNITY): Payer: Self-pay

## 2024-04-11 ENCOUNTER — Other Ambulatory Visit: Payer: Self-pay

## 2024-04-11 ENCOUNTER — Other Ambulatory Visit: Payer: Self-pay | Admitting: Pharmacy Technician

## 2024-04-11 ENCOUNTER — Encounter: Payer: Self-pay | Admitting: Internal Medicine

## 2024-04-11 MED ORDER — OTEZLA 30 MG PO TABS: 1.0000 | ORAL_TABLET | Freq: Two times a day (BID) | ORAL | 5 refills | Status: DC

## 2024-04-12 ENCOUNTER — Encounter: Payer: Self-pay | Admitting: Pharmacist

## 2024-04-12 ENCOUNTER — Other Ambulatory Visit: Payer: Self-pay

## 2024-04-12 ENCOUNTER — Encounter (HOSPITAL_COMMUNITY): Payer: Self-pay

## 2024-04-12 ENCOUNTER — Telehealth: Payer: Self-pay | Admitting: Pharmacist

## 2024-04-12 ENCOUNTER — Ambulatory Visit: Attending: Family Medicine | Admitting: Pharmacist

## 2024-04-12 ENCOUNTER — Other Ambulatory Visit: Payer: Self-pay | Admitting: Pharmacist

## 2024-04-12 DIAGNOSIS — L400 Psoriasis vulgaris: Secondary | ICD-10-CM

## 2024-04-12 DIAGNOSIS — Z7189 Other specified counseling: Secondary | ICD-10-CM

## 2024-04-12 MED ORDER — OTEZLA 30 MG PO TABS
1.0000 | ORAL_TABLET | Freq: Two times a day (BID) | ORAL | 5 refills | Status: AC
  Filled 2024-04-13 – 2024-05-24 (×3): qty 60, 30d supply, fill #0
  Filled 2024-06-22: qty 60, 30d supply, fill #1
  Filled 2024-07-17 – 2024-07-26 (×2): qty 60, 30d supply, fill #2
  Filled 2024-08-27: qty 60, 30d supply, fill #3

## 2024-04-12 NOTE — Telephone Encounter (Signed)
 Called patient to schedule an appointment for the [facility] [employer] Specialty Medication [facility]. I was unable to reach the patient so I left a HIPAA-compliant message requesting that the patient return my call.   Butch Penny, PharmD, Patsy Baltimore, CPP Clinical Pharmacist [facility] & [facility] [phone]

## 2024-04-12 NOTE — Progress Notes (Signed)
 See office visit from 04/12/2024 for complete documentation.   Marene Shape, PharmD, Becky Bowels, CPP Clinical Pharmacist [facility] & [facility] [phone]

## 2024-04-12 NOTE — Progress Notes (Signed)
  S: Patient presents for review of their specialty medication therapy.  Patient is currently taking Otezla for psoriasis. Patient is managed by Dr. Ivonne Marry for this.   Adherence: confirms  Efficacy: reports that it's working okay but needs more time. We are treating a patch of psoriasis under her fingernail and will need more time for better efficacy.  Dosing:  Active psoriatic arthritis or plaque psoriasis (moderate to severe): Oral: Initial: 10 mg in the morning. Titrate upward by additional 10 mg per day on days 2 to 5 as follows: Day 2: 10 mg twice daily; Day 3: 10 mg in the morning and 20 mg in the evening; Day 4: 20 mg twice daily; Day 5: 20 mg in the morning and 30 mg in the evening. Maintenance dose: 30 mg twice daily starting on day 6  CrCl <30 mL/minute: Initial: 10 mg in the morning on days 1 to 3; titrate using morning doses only (skip evening doses) to 20 mg on days 4 and 5. Maintenance dose: 30 mg once daily in the morning starting on day 6.   Current adverse effects: Headache: none  GI upset: none  Weight loss: none  Neuropsychiatric effects: none  O:     Lab Results  Component Value Date   WBC 8.2 10/23/2023   HGB 11.0 (L) 10/23/2023   HCT 35.4 (L) 10/23/2023   MCV 85.5 10/23/2023   PLT 309 10/23/2023      Chemistry      Component Value Date/Time   NA 140 10/23/2023 0445   NA 142 07/21/2023 1131   K 3.5 10/23/2023 0445   CL 104 10/23/2023 0445   CO2 27 10/23/2023 0445   BUN 24 (H) 10/23/2023 0445   BUN 14 07/21/2023 1131   CREATININE 0.95 10/23/2023 0445   GLU 113 12/29/2021 0000      Component Value Date/Time   CALCIUM  9.1 10/23/2023 0445   ALKPHOS 110 10/23/2023 0445   AST 16 10/23/2023 0445   ALT 15 10/23/2023 0445   BILITOT 0.3 10/23/2023 0445   BILITOT 0.5 07/21/2023 1131       A/P: 1. Medication review: patient is currently on Otezla for psoriasis and is tolerating it well. Reviewed the medication including the following: apremilast  inhibits phosphodiesterase 4 (PDE4) specific for cyclic adenosine monophosphate (cAMP) which results in increased intracellular cAMP levels and regulation of numerous inflammatory mediators (eg, decreased expression of nitric oxide synthase, TNF-alpha, and interleukin [IL]-23, as well as increased IL-10. Patient educated on purpose, proper use and potential adverse effects of Otezla. Possible adverse effects include weight loss, GI upset, headache, and mood changes. Renal function should be routinely monitored. Administer without regard to food. Do not crush, chew, or split tablets. No recommendations for any changes at this time.  Marene Shape, PharmD, Becky Bowels, CPP Clinical Pharmacist [facility] [phone]

## 2024-04-12 NOTE — Progress Notes (Addendum)
 Pharmacy Patient Advocate Encounter   Received notification from Patient Pharmacy that prior authorization for Otezla  is required/requested.   Insurance verification completed.   The patient is insured through [facility] .   Per test claim: PA required; However, NEW/RECENT labs/notes are needed to complete & submit PA request. Please see below.   Faxed key to provider.  [email or web address]

## 2024-04-13 ENCOUNTER — Other Ambulatory Visit: Payer: Self-pay

## 2024-04-16 ENCOUNTER — Other Ambulatory Visit: Payer: Self-pay

## 2024-04-18 ENCOUNTER — Other Ambulatory Visit: Payer: Self-pay

## 2024-04-25 ENCOUNTER — Encounter: Payer: Self-pay | Admitting: Internal Medicine

## 2024-04-25 ENCOUNTER — Other Ambulatory Visit: Payer: Self-pay

## 2024-04-25 NOTE — Progress Notes (Addendum)
 Office is aware of PA. Nurse who works on [employer] will be out of the office until next week. Patient aware. Will check in with office next week

## 2024-04-27 ENCOUNTER — Other Ambulatory Visit: Payer: Self-pay

## 2024-05-02 ENCOUNTER — Other Ambulatory Visit: Payer: Self-pay

## 2024-05-08 ENCOUNTER — Other Ambulatory Visit: Payer: Self-pay

## 2024-05-09 ENCOUNTER — Other Ambulatory Visit (HOSPITAL_COMMUNITY): Payer: Self-pay

## 2024-05-10 ENCOUNTER — Other Ambulatory Visit (HOSPITAL_COMMUNITY): Payer: Self-pay

## 2024-05-10 ENCOUNTER — Other Ambulatory Visit: Payer: Self-pay

## 2024-05-10 DIAGNOSIS — L400 Psoriasis vulgaris: Secondary | ICD-10-CM | POA: Diagnosis not present

## 2024-05-10 DIAGNOSIS — Z85828 Personal history of other malignant neoplasm of skin: Secondary | ICD-10-CM | POA: Diagnosis not present

## 2024-05-10 DIAGNOSIS — Z79899 Other long term (current) drug therapy: Secondary | ICD-10-CM | POA: Diagnosis not present

## 2024-05-10 MED ORDER — OTEZLA 30 MG PO TABS: 1.0000 | ORAL_TABLET | Freq: Two times a day (BID) | ORAL | 5 refills | Status: AC

## 2024-05-11 ENCOUNTER — Other Ambulatory Visit: Payer: Self-pay

## 2024-05-24 ENCOUNTER — Other Ambulatory Visit (HOSPITAL_COMMUNITY): Payer: Self-pay

## 2024-05-24 ENCOUNTER — Encounter (HOSPITAL_COMMUNITY): Payer: Self-pay

## 2024-05-24 NOTE — Progress Notes (Signed)
 Specialty Pharmacy Initial Fill Coordination Note  Dana Williams is a 63 y.o. female contacted today regarding initial fill of specialty medication(s) Apremilast  (Otezla )   Patient requested Delivery   Delivery date: 05/29/24   Verified address: [address]   Medication will be filled on 7/14.   Patient is aware of $0 copayment.

## 2024-05-24 NOTE — Progress Notes (Addendum)
 Pharmacy Patient Advocate Encounter  Received notification from [facility] that Prior Authorization for Otezla  has been APPROVED from 05/11/24 to 11/07/24   PA #/Case ID/Reference #: [id number]

## 2024-06-21 ENCOUNTER — Other Ambulatory Visit: Payer: Self-pay

## 2024-06-21 ENCOUNTER — Other Ambulatory Visit (HOSPITAL_COMMUNITY): Payer: Self-pay

## 2024-06-21 DIAGNOSIS — E538 Deficiency of other specified B group vitamins: Secondary | ICD-10-CM | POA: Diagnosis not present

## 2024-06-21 DIAGNOSIS — Z6841 Body Mass Index (BMI) 40.0 and over, adult: Secondary | ICD-10-CM | POA: Diagnosis not present

## 2024-06-21 DIAGNOSIS — L4050 Arthropathic psoriasis, unspecified: Secondary | ICD-10-CM | POA: Diagnosis not present

## 2024-06-21 DIAGNOSIS — E559 Vitamin D deficiency, unspecified: Secondary | ICD-10-CM | POA: Diagnosis not present

## 2024-06-21 DIAGNOSIS — F3341 Major depressive disorder, recurrent, in partial remission: Secondary | ICD-10-CM | POA: Diagnosis not present

## 2024-06-21 DIAGNOSIS — I10 Essential (primary) hypertension: Secondary | ICD-10-CM | POA: Diagnosis not present

## 2024-06-21 DIAGNOSIS — D508 Other iron deficiency anemias: Secondary | ICD-10-CM | POA: Diagnosis not present

## 2024-06-21 DIAGNOSIS — E7800 Pure hypercholesterolemia, unspecified: Secondary | ICD-10-CM | POA: Diagnosis not present

## 2024-06-21 MED ORDER — OZEMPIC (0.25 OR 0.5 MG/DOSE) 2 MG/3ML ~~LOC~~ SOPN
0.2500 mg | PEN_INJECTOR | SUBCUTANEOUS | 0 refills | Status: AC
  Filled 2024-06-21 – 2024-09-24 (×2): qty 3, 28d supply, fill #0

## 2024-06-21 MED ORDER — CLONIDINE HCL 0.1 MG PO TABS
0.1000 mg | ORAL_TABLET | Freq: Every day | ORAL | 1 refills | Status: AC
  Filled 2024-06-21: qty 90, 90d supply, fill #0

## 2024-06-21 MED ORDER — SERTRALINE HCL 100 MG PO TABS
150.0000 mg | ORAL_TABLET | Freq: Every day | ORAL | 0 refills | Status: AC
  Filled 2024-06-21: qty 135, 90d supply, fill #0

## 2024-06-21 MED ORDER — ROSUVASTATIN CALCIUM 10 MG PO TABS
10.0000 mg | ORAL_TABLET | Freq: Every day | ORAL | 1 refills | Status: AC
  Filled 2024-06-21: qty 90, 90d supply, fill #0

## 2024-06-21 MED ORDER — WEGOVY 0.25 MG/0.5ML ~~LOC~~ SOAJ
0.2500 mg | SUBCUTANEOUS | 0 refills | Status: AC
  Filled 2024-06-21: qty 2, 28d supply, fill #0

## 2024-06-22 ENCOUNTER — Other Ambulatory Visit: Payer: Self-pay

## 2024-06-22 ENCOUNTER — Other Ambulatory Visit: Payer: Self-pay | Admitting: Pharmacy Technician

## 2024-06-22 ENCOUNTER — Encounter (INDEPENDENT_AMBULATORY_CARE_PROVIDER_SITE_OTHER): Payer: Self-pay

## 2024-06-22 ENCOUNTER — Other Ambulatory Visit (HOSPITAL_COMMUNITY): Payer: Self-pay

## 2024-06-22 NOTE — Progress Notes (Signed)
 Specialty Pharmacy Refill Coordination Note  Dana Williams is a 63 y.o. female contacted today regarding refills of specialty medication(s) Apremilast  (Otezla )   Patient requested (Patient-Rptd) Delivery   Delivery date: 06/25/24  Verified address: (Patient-Rptd) [address] [place] [address]   Medication will be filled on 06/22/24.

## 2024-06-25 ENCOUNTER — Other Ambulatory Visit (HOSPITAL_COMMUNITY): Payer: Self-pay

## 2024-06-26 ENCOUNTER — Other Ambulatory Visit (HOSPITAL_COMMUNITY): Payer: Self-pay

## 2024-07-13 ENCOUNTER — Other Ambulatory Visit (HOSPITAL_COMMUNITY): Payer: Self-pay

## 2024-07-17 ENCOUNTER — Other Ambulatory Visit: Payer: Self-pay

## 2024-07-19 ENCOUNTER — Other Ambulatory Visit: Payer: Self-pay

## 2024-07-20 ENCOUNTER — Other Ambulatory Visit: Payer: Self-pay

## 2024-07-23 ENCOUNTER — Other Ambulatory Visit (HOSPITAL_COMMUNITY): Payer: Self-pay

## 2024-07-26 ENCOUNTER — Other Ambulatory Visit: Payer: Self-pay

## 2024-07-26 NOTE — Progress Notes (Signed)
 Specialty Pharmacy Refill Coordination Note  Dana Williams is a 62 y.o. female contacted today regarding refills of specialty medication(s) Apremilast  (Otezla )   Patient requested Delivery   Delivery date: 08/01/24   Verified address: [address] [place] [address]   Medication will be filled on 07/31/24.

## 2024-07-26 NOTE — Progress Notes (Signed)
 Specialty Pharmacy Ongoing Clinical Assessment Note  Dana Williams is a 63 y.o. female who is being followed by the specialty pharmacy service for RxSp Psoriasis   Patient's specialty medication(s) reviewed today: Apremilast  (Otezla )   Missed doses in the last 4 weeks: 0   Patient/Caregiver did not have any additional questions or concerns.   Therapeutic benefit summary: Patient is achieving benefit   Adverse events/side effects summary: No adverse events/side effects   Patient's therapy is appropriate to: Continue    Goals Addressed             This Visit's Progress    Maintain optimal adherence to therapy   On track    Patient is on track. Patient will maintain adherence.  Patient reports improvement, though her psoriasis has not yet completely resolved.          Follow up: 12 months  Silvano LOISE Dolly Specialty Pharmacist

## 2024-07-31 ENCOUNTER — Other Ambulatory Visit: Payer: Self-pay

## 2024-08-27 ENCOUNTER — Other Ambulatory Visit (HOSPITAL_COMMUNITY): Payer: Self-pay

## 2024-08-29 ENCOUNTER — Other Ambulatory Visit: Payer: Self-pay

## 2024-09-18 ENCOUNTER — Other Ambulatory Visit (HOSPITAL_COMMUNITY): Payer: Self-pay

## 2024-09-18 DIAGNOSIS — E538 Deficiency of other specified B group vitamins: Secondary | ICD-10-CM | POA: Diagnosis not present

## 2024-09-18 DIAGNOSIS — E559 Vitamin D deficiency, unspecified: Secondary | ICD-10-CM | POA: Diagnosis not present

## 2024-09-18 DIAGNOSIS — R7303 Prediabetes: Secondary | ICD-10-CM | POA: Diagnosis not present

## 2024-09-18 DIAGNOSIS — R748 Abnormal levels of other serum enzymes: Secondary | ICD-10-CM | POA: Diagnosis not present

## 2024-09-18 DIAGNOSIS — Z6841 Body Mass Index (BMI) 40.0 and over, adult: Secondary | ICD-10-CM | POA: Diagnosis not present

## 2024-09-18 DIAGNOSIS — F3341 Major depressive disorder, recurrent, in partial remission: Secondary | ICD-10-CM | POA: Diagnosis not present

## 2024-09-18 DIAGNOSIS — E66813 Obesity, class 3: Secondary | ICD-10-CM | POA: Diagnosis not present

## 2024-09-18 DIAGNOSIS — I10 Essential (primary) hypertension: Secondary | ICD-10-CM | POA: Diagnosis not present

## 2024-09-18 DIAGNOSIS — E7800 Pure hypercholesterolemia, unspecified: Secondary | ICD-10-CM | POA: Diagnosis not present

## 2024-09-18 DIAGNOSIS — L4050 Arthropathic psoriasis, unspecified: Secondary | ICD-10-CM | POA: Diagnosis not present

## 2024-09-18 DIAGNOSIS — D508 Other iron deficiency anemias: Secondary | ICD-10-CM | POA: Diagnosis not present

## 2024-09-18 MED ORDER — BUPROPION HCL ER (XL) 150 MG PO TB24
150.0000 mg | ORAL_TABLET | Freq: Every morning | ORAL | 0 refills | Status: DC
  Filled 2024-09-18: qty 30, 30d supply, fill #0

## 2024-09-18 MED ORDER — MOUNJARO 2.5 MG/0.5ML ~~LOC~~ SOAJ
2.5000 mg | SUBCUTANEOUS | 0 refills | Status: AC
  Filled 2024-09-18 – 2024-09-24 (×3): qty 2, 28d supply, fill #0

## 2024-09-19 ENCOUNTER — Other Ambulatory Visit (HOSPITAL_COMMUNITY): Payer: Self-pay

## 2024-09-19 ENCOUNTER — Other Ambulatory Visit: Payer: Self-pay

## 2024-09-24 ENCOUNTER — Other Ambulatory Visit: Payer: Self-pay

## 2024-09-24 ENCOUNTER — Other Ambulatory Visit (HOSPITAL_COMMUNITY): Payer: Self-pay

## 2024-09-24 MED ORDER — TIRZEPATIDE-WEIGHT MANAGEMENT 2.5 MG/0.5ML ~~LOC~~ SOAJ
2.5000 mg | SUBCUTANEOUS | 0 refills | Status: AC
  Filled 2024-09-24 – 2024-10-03 (×2): qty 2, 28d supply, fill #0

## 2024-09-25 ENCOUNTER — Other Ambulatory Visit (HOSPITAL_COMMUNITY): Payer: Self-pay

## 2024-10-03 ENCOUNTER — Other Ambulatory Visit (HOSPITAL_COMMUNITY): Payer: Self-pay

## 2024-10-16 DIAGNOSIS — R748 Abnormal levels of other serum enzymes: Secondary | ICD-10-CM | POA: Diagnosis not present

## 2024-10-24 ENCOUNTER — Other Ambulatory Visit: Payer: Self-pay

## 2024-10-24 ENCOUNTER — Telehealth: Payer: Self-pay

## 2024-10-24 NOTE — Telephone Encounter (Signed)
 Pharmacy Patient Advocate Encounter   Received notification from Onbase that prior authorization for Otezla  is required/requested.   Insurance verification completed.   The patient is insured through [facility].   Per test claim: PA required; PA submitted to above mentioned insurance via Latent Key/confirmation #/EOC [id number] Status is pending

## 2024-10-25 NOTE — Telephone Encounter (Signed)
 Pharmacy Patient Advocate Encounter  Received notification from [facility] that Prior Authorization for Otezla  has been APPROVED from 10/24/24 to 10/23/25   PA #/Case ID/Reference #: [id number]

## 2024-10-30 ENCOUNTER — Other Ambulatory Visit (HOSPITAL_COMMUNITY): Payer: Self-pay

## 2024-10-30 MED ORDER — VITAMIN D (ERGOCALCIFEROL) 50000 UNITS PO CAPS
1.0000 | ORAL_CAPSULE | ORAL | 0 refills | Status: AC
  Filled 2024-10-30: qty 12, 84d supply, fill #0

## 2024-11-20 ENCOUNTER — Other Ambulatory Visit (HOSPITAL_COMMUNITY): Payer: Self-pay

## 2024-11-21 ENCOUNTER — Other Ambulatory Visit (HOSPITAL_COMMUNITY): Payer: Self-pay

## 2024-11-21 MED ORDER — BUPROPION HCL ER (XL) 150 MG PO TB24
150.0000 mg | ORAL_TABLET | Freq: Every morning | ORAL | 0 refills | Status: AC
  Filled 2024-11-21: qty 30, 30d supply, fill #0
